# Patient Record
Sex: Female | Born: 1940 | Race: White | Hispanic: No | Smoking: Former smoker
Health system: Southern US, Community
[De-identification: ages and names within clinical notes are randomized; demographics above are authoritative.]

## PROBLEM LIST (undated history)

## (undated) DIAGNOSIS — R0600 Dyspnea, unspecified: Secondary | ICD-10-CM

## (undated) DIAGNOSIS — M810 Age-related osteoporosis without current pathological fracture: Secondary | ICD-10-CM

## (undated) DIAGNOSIS — G250 Essential tremor: Secondary | ICD-10-CM

## (undated) DIAGNOSIS — I10 Essential (primary) hypertension: Secondary | ICD-10-CM

## (undated) DIAGNOSIS — A498 Other bacterial infections of unspecified site: Secondary | ICD-10-CM

## (undated) DIAGNOSIS — K5790 Diverticulosis of intestine, part unspecified, without perforation or abscess without bleeding: Secondary | ICD-10-CM

## (undated) DIAGNOSIS — I82409 Acute embolism and thrombosis of unspecified deep veins of unspecified lower extremity: Secondary | ICD-10-CM

## (undated) DIAGNOSIS — M5136 Other intervertebral disc degeneration, lumbar region: Secondary | ICD-10-CM

## (undated) DIAGNOSIS — M51369 Other intervertebral disc degeneration, lumbar region without mention of lumbar back pain or lower extremity pain: Secondary | ICD-10-CM

## (undated) DIAGNOSIS — M1990 Unspecified osteoarthritis, unspecified site: Secondary | ICD-10-CM

## (undated) DIAGNOSIS — K8020 Calculus of gallbladder without cholecystitis without obstruction: Secondary | ICD-10-CM

## (undated) DIAGNOSIS — G4730 Sleep apnea, unspecified: Secondary | ICD-10-CM

## (undated) DIAGNOSIS — K219 Gastro-esophageal reflux disease without esophagitis: Secondary | ICD-10-CM

## (undated) DIAGNOSIS — Z973 Presence of spectacles and contact lenses: Secondary | ICD-10-CM

## (undated) DIAGNOSIS — G709 Myoneural disorder, unspecified: Secondary | ICD-10-CM

## (undated) HISTORY — DX: Other intervertebral disc degeneration, lumbar region without mention of lumbar back pain or lower extremity pain: M51.369

## (undated) HISTORY — DX: Other intervertebral disc degeneration, lumbar region: M51.36

## (undated) HISTORY — DX: Other bacterial infections of unspecified site: A49.8

## (undated) HISTORY — DX: Essential tremor: G25.0

## (undated) HISTORY — DX: Diverticulosis of intestine, part unspecified, without perforation or abscess without bleeding: K57.90

## (undated) HISTORY — PX: BLADDER REPAIR: SHX76

## (undated) HISTORY — DX: Acute embolism and thrombosis of unspecified deep veins of unspecified lower extremity: I82.409

## (undated) HISTORY — PX: CARPAL TUNNEL RELEASE: SHX101

## (undated) HISTORY — PX: TRIGGER FINGER RELEASE: SHX641

---

## 1981-12-13 HISTORY — PX: ABDOMINAL HYSTERECTOMY: SHX81

## 1999-05-20 ENCOUNTER — Encounter: Payer: Self-pay | Admitting: Orthopedic Surgery

## 1999-05-22 ENCOUNTER — Ambulatory Visit (HOSPITAL_COMMUNITY): Admission: RE | Admit: 1999-05-22 | Discharge: 1999-05-22 | Payer: Self-pay | Admitting: Orthopedic Surgery

## 1999-10-29 ENCOUNTER — Encounter: Admission: RE | Admit: 1999-10-29 | Discharge: 1999-10-29 | Payer: Self-pay | Admitting: Family Medicine

## 1999-10-29 ENCOUNTER — Encounter: Payer: Self-pay | Admitting: Family Medicine

## 2000-11-01 ENCOUNTER — Encounter: Admission: RE | Admit: 2000-11-01 | Discharge: 2000-11-01 | Payer: Self-pay | Admitting: Family Medicine

## 2000-11-01 ENCOUNTER — Encounter: Payer: Self-pay | Admitting: Family Medicine

## 2001-03-31 ENCOUNTER — Encounter: Payer: Self-pay | Admitting: Family Medicine

## 2001-03-31 ENCOUNTER — Encounter: Admission: RE | Admit: 2001-03-31 | Discharge: 2001-03-31 | Payer: Self-pay | Admitting: Family Medicine

## 2001-11-02 ENCOUNTER — Encounter: Admission: RE | Admit: 2001-11-02 | Discharge: 2001-11-02 | Payer: Self-pay | Admitting: Family Medicine

## 2001-11-02 ENCOUNTER — Encounter: Payer: Self-pay | Admitting: Family Medicine

## 2002-11-30 ENCOUNTER — Encounter: Payer: Self-pay | Admitting: Family Medicine

## 2002-11-30 ENCOUNTER — Encounter: Admission: RE | Admit: 2002-11-30 | Discharge: 2002-11-30 | Payer: Self-pay | Admitting: Family Medicine

## 2002-12-13 HISTORY — PX: ROTATOR CUFF REPAIR: SHX139

## 2002-12-13 HISTORY — PX: OTHER SURGICAL HISTORY: SHX169

## 2003-05-09 ENCOUNTER — Encounter: Payer: Self-pay | Admitting: Orthopedic Surgery

## 2003-05-23 ENCOUNTER — Observation Stay (HOSPITAL_COMMUNITY): Admission: RE | Admit: 2003-05-23 | Discharge: 2003-05-24 | Payer: Self-pay | Admitting: Orthopedic Surgery

## 2003-11-28 ENCOUNTER — Observation Stay (HOSPITAL_COMMUNITY): Admission: RE | Admit: 2003-11-28 | Discharge: 2003-11-29 | Payer: Self-pay | Admitting: Orthopedic Surgery

## 2003-12-24 ENCOUNTER — Encounter: Admission: RE | Admit: 2003-12-24 | Discharge: 2003-12-24 | Payer: Self-pay | Admitting: Internal Medicine

## 2004-01-20 ENCOUNTER — Ambulatory Visit (HOSPITAL_COMMUNITY): Admission: RE | Admit: 2004-01-20 | Discharge: 2004-01-20 | Payer: Self-pay | Admitting: *Deleted

## 2004-01-27 ENCOUNTER — Ambulatory Visit (HOSPITAL_COMMUNITY): Admission: RE | Admit: 2004-01-27 | Discharge: 2004-01-27 | Payer: Self-pay | Admitting: *Deleted

## 2004-08-12 ENCOUNTER — Encounter: Admission: RE | Admit: 2004-08-12 | Discharge: 2004-08-12 | Payer: Self-pay | Admitting: Internal Medicine

## 2005-01-07 ENCOUNTER — Encounter: Admission: RE | Admit: 2005-01-07 | Discharge: 2005-01-07 | Payer: Self-pay | Admitting: Internal Medicine

## 2005-01-11 ENCOUNTER — Encounter: Admission: RE | Admit: 2005-01-11 | Discharge: 2005-01-11 | Payer: Self-pay | Admitting: Internal Medicine

## 2005-01-26 ENCOUNTER — Encounter: Admission: RE | Admit: 2005-01-26 | Discharge: 2005-01-26 | Payer: Self-pay | Admitting: Internal Medicine

## 2006-01-24 ENCOUNTER — Encounter: Admission: RE | Admit: 2006-01-24 | Discharge: 2006-01-24 | Payer: Self-pay | Admitting: Internal Medicine

## 2007-03-02 ENCOUNTER — Ambulatory Visit (HOSPITAL_COMMUNITY): Admission: RE | Admit: 2007-03-02 | Discharge: 2007-03-02 | Payer: Self-pay | Admitting: Orthopedic Surgery

## 2007-04-18 ENCOUNTER — Encounter: Admission: RE | Admit: 2007-04-18 | Discharge: 2007-04-18 | Payer: Self-pay | Admitting: Internal Medicine

## 2007-11-05 ENCOUNTER — Emergency Department (HOSPITAL_COMMUNITY): Admission: EM | Admit: 2007-11-05 | Discharge: 2007-11-05 | Payer: Self-pay | Admitting: Emergency Medicine

## 2008-03-09 ENCOUNTER — Encounter: Admission: RE | Admit: 2008-03-09 | Discharge: 2008-03-09 | Payer: Self-pay | Admitting: Internal Medicine

## 2008-05-15 ENCOUNTER — Encounter: Admission: RE | Admit: 2008-05-15 | Discharge: 2008-05-15 | Payer: Self-pay | Admitting: Internal Medicine

## 2008-09-05 ENCOUNTER — Encounter: Admission: RE | Admit: 2008-09-05 | Discharge: 2008-09-05 | Payer: Self-pay | Admitting: Internal Medicine

## 2009-01-21 ENCOUNTER — Emergency Department (HOSPITAL_BASED_OUTPATIENT_CLINIC_OR_DEPARTMENT_OTHER): Admission: EM | Admit: 2009-01-21 | Discharge: 2009-01-21 | Payer: Self-pay | Admitting: Emergency Medicine

## 2009-01-21 ENCOUNTER — Ambulatory Visit: Payer: Self-pay | Admitting: Diagnostic Radiology

## 2009-06-04 ENCOUNTER — Encounter: Admission: RE | Admit: 2009-06-04 | Discharge: 2009-06-04 | Payer: Self-pay | Admitting: Internal Medicine

## 2010-06-24 ENCOUNTER — Encounter: Admission: RE | Admit: 2010-06-24 | Discharge: 2010-06-24 | Payer: Self-pay | Admitting: Internal Medicine

## 2010-10-26 ENCOUNTER — Emergency Department (HOSPITAL_BASED_OUTPATIENT_CLINIC_OR_DEPARTMENT_OTHER): Admission: EM | Admit: 2010-10-26 | Discharge: 2010-10-26 | Payer: Self-pay | Admitting: Emergency Medicine

## 2011-01-03 ENCOUNTER — Encounter: Payer: Self-pay | Admitting: Internal Medicine

## 2011-02-23 LAB — URINALYSIS, ROUTINE W REFLEX MICROSCOPIC
Bilirubin Urine: NEGATIVE
Glucose, UA: NEGATIVE mg/dL
Ketones, ur: NEGATIVE mg/dL
Nitrite: NEGATIVE
pH: 7.5 (ref 5.0–8.0)

## 2011-03-30 LAB — URINE MICROSCOPIC-ADD ON

## 2011-03-30 LAB — URINALYSIS, ROUTINE W REFLEX MICROSCOPIC
Bilirubin Urine: NEGATIVE
Glucose, UA: NEGATIVE mg/dL
Hgb urine dipstick: NEGATIVE
Ketones, ur: NEGATIVE mg/dL
Nitrite: NEGATIVE
Protein, ur: NEGATIVE mg/dL
Specific Gravity, Urine: 1.028 (ref 1.005–1.030)
Urobilinogen, UA: 0.2 mg/dL (ref 0.0–1.0)
pH: 5.5 (ref 5.0–8.0)

## 2011-03-30 LAB — CBC
HCT: 42 % (ref 36.0–46.0)
Hemoglobin: 14.5 g/dL (ref 12.0–15.0)
MCHC: 34.5 g/dL (ref 30.0–36.0)
MCV: 93.9 fL (ref 78.0–100.0)
Platelets: 217 K/uL (ref 150–400)
RBC: 4.47 MIL/uL (ref 3.87–5.11)
RDW: 12.9 % (ref 11.5–15.5)
WBC: 10.6 K/uL — ABNORMAL HIGH (ref 4.0–10.5)

## 2011-03-30 LAB — COMPREHENSIVE METABOLIC PANEL WITH GFR
ALT: 10 U/L (ref 0–35)
AST: 20 U/L (ref 0–37)
Albumin: 4.2 g/dL (ref 3.5–5.2)
Alkaline Phosphatase: 93 U/L (ref 39–117)
Calcium: 9 mg/dL (ref 8.4–10.5)
GFR calc Af Amer: >60 mL/min (ref >60)
Potassium: 3.9 meq/L (ref 3.5–5.1)
Sodium: 137 meq/L (ref 135–145)
Total Protein: 7.5 g/dL (ref 6.0–8.3)

## 2011-03-30 LAB — DIFFERENTIAL
Basophils Absolute: 0.1 10*3/uL (ref 0.0–0.1)
Basophils Relative: 1 % (ref 0–1)
Eosinophils Absolute: 0 K/uL (ref 0.0–0.7)
Eosinophils Relative: 0 % (ref 0–5)
Lymphocytes Relative: 5 % — ABNORMAL LOW (ref 12–46)
Lymphs Abs: 0.5 K/uL — ABNORMAL LOW (ref 0.7–4.0)
Monocytes Absolute: 0.6 K/uL (ref 0.1–1.0)
Monocytes Relative: 5 % (ref 3–12)
Neutro Abs: 9.4 10*3/uL — ABNORMAL HIGH (ref 1.7–7.7)
Neutrophils Relative %: 88 % — ABNORMAL HIGH (ref 43–77)

## 2011-03-30 LAB — COMPREHENSIVE METABOLIC PANEL
BUN: 19 mg/dL (ref 6–23)
CO2: 27 mEq/L (ref 19–32)
Chloride: 100 mEq/L (ref 96–112)
Creatinine, Ser: 0.6 mg/dL (ref 0.4–1.2)
GFR calc non Af Amer: >60 mL/min (ref >60)
Glucose, Bld: 108 mg/dL — ABNORMAL HIGH (ref 70–99)
Total Bilirubin: 0.5 mg/dL (ref 0.3–1.2)

## 2011-03-30 LAB — LIPASE, BLOOD: Lipase: 64 U/L (ref 23–300)

## 2011-04-19 ENCOUNTER — Ambulatory Visit (HOSPITAL_BASED_OUTPATIENT_CLINIC_OR_DEPARTMENT_OTHER)
Admission: RE | Admit: 2011-04-19 | Discharge: 2011-04-19 | Disposition: A | Discharge status: Home / Self Care | Payer: Medicare Other | Source: Ambulatory Visit | Attending: Orthopedic Surgery | Admitting: Orthopedic Surgery

## 2011-04-19 DIAGNOSIS — Z01810 Encounter for preprocedural cardiovascular examination: Secondary | ICD-10-CM | POA: Insufficient documentation

## 2011-04-19 DIAGNOSIS — M6530 Trigger finger, unspecified finger: Secondary | ICD-10-CM | POA: Insufficient documentation

## 2011-04-19 DIAGNOSIS — R9431 Abnormal electrocardiogram [ECG] [EKG]: Secondary | ICD-10-CM | POA: Insufficient documentation

## 2011-04-19 DIAGNOSIS — Z01812 Encounter for preprocedural laboratory examination: Secondary | ICD-10-CM | POA: Insufficient documentation

## 2011-04-20 LAB — POCT I-STAT, CHEM 8
BUN: 11 mg/dL (ref 6–23)
Chloride: 103 mEq/L (ref 96–112)
Creatinine, Ser: 0.8 mg/dL (ref 0.4–1.2)
Sodium: 140 mEq/L (ref 135–145)
TCO2: 24 mmol/L (ref 0–100)

## 2011-04-20 LAB — POCT HEMOGLOBIN-HEMACUE: Hemoglobin: 13.1 g/dL (ref 12.0–15.0)

## 2011-04-28 NOTE — Op Note (Signed)
  [name], [name]              ACCOUNT NO.:  [id number]  MEDICAL RECORD NO.:  [id number]          PATIENT TYPE:  LOCATION:                                 FACILITY:  PHYSICIAN:  Marlowe Kays, M.D.  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  OPERATION:  Release A1 pulley, right long and ring finger.  SURGEON:  Marlowe Kays, MD  ASSISTANT:  Druscilla Brownie. Idolina Primer, PA-C.  ANESTHESIA:  IV regional.  PATHOLOGY AND JUSTIFICATION FOR PROCEDURE:  Staging diagnosis, trigging was painful, and unresponsive to steroid injection.  PROCEDURE IN DETAIL:  After satisfactory regional anesthesia, DuraPrep from midforearm to fingertips was draped in sterile field.  Time-out performed.  I first made a transverse incision midway between the distal palmar crease and the flexor crease at the MP joint, protecting the neurovascular structures with blunt dissection.  I went down to the tendon sheath which I carefully opened with a 15 knife blade and then freed it up proximally and distally with small scissors.  She had 2 areas of extensive sticking down of the overlying tissue to the tendon of one being at the A1 pulley and one proximal to this more in the region of the distal palmar crease.  I then checked passively moving the finger and flexor tendons had normal excursion.  I then performed same surgery for the long finger.  Both wounds were irrigated with sterile saline.  Subcutaneous tissue was closed with 4-0 nylon and the skin preceded by a single 4-0 Vicryl in the subcutaneous tissue.  Betadine and Adaptic dry sterile dressing were applied.  Tourniquet was released. At the time of this  dictation, she was on her way to Recovery in satisfactory condition with no known complications.          ______________________________ Marlowe Kays, M.D.     JA/MEDQ  D:  04/19/2011  T:  04/20/2011  Job:  [id number]  Electronically Signed by Marlowe Kays M.D. on 04/28/2011 04:21:10 PM

## 2011-04-30 NOTE — H&P (Signed)
NAME:  [name], [name]                        ACCOUNT NO.:  [id number]   MEDICAL RECORD NO.:  [id number]                   PATIENT TYPE:  AMB   LOCATION:  DAY                                  FACILITY:  [facility]   PHYSICIAN:  Marlowe Kays, M.D.               DATE OF BIRTH:  May 12, 1941   DATE OF ADMISSION:  05/23/2003  DATE OF DISCHARGE:                                HISTORY & PHYSICAL   CHIEF COMPLAINT:  Pain in both shoulders, worse on the right.   HISTORY OF PRESENT ILLNESS:  The patient is a 70 year old female seen by Dr.  Simonne Come for evaluation of bilateral shoulder pain about six weeks'  duration.  The patient states pain is particularly present at night.  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  She states that the pain goes down to her biceps area.  She has had  carpal tunnel release on the right which she states has some residual  tingling in her right hand.  MRI of the right shoulder reveals large full  thickness tear of the supraspinatus tendon.  The defect is at least 3 cm in  size and a longitudinal intrasubstance split tear of the infraspinatus  tendon.  Dr. Simonne Come felt it was best to do an open rotator cuff repair of  the right shoulder.  The risks and benefits of the surgery are discussed  with the patient and the patient wishes to proceed.   PAST MEDICAL HISTORY:  1. Hypertension.  2. GERD.  3. Anxiety.   PAST SURGICAL HISTORY:  1. Hysterectomy.  2. Bladder tack.  3. Carpal tunnel release on the right.   MEDICATIONS:  1. Diovan HCT 80/12.5 mg one p.o. daily  2. Norvasc 5 mg one p.o. daily.  3. Nexium 40 mg one p.o. daily.  4. Effexor 75 mg one p.o. daily.  5. Premarin 0.9 mg one p.o. daily.  6. Aleve four a day.  7. Calcium supplement.   ALLERGIES:  MORPHINE causes nausea.   SOCIAL HISTORY:  Denies tobacco, minimal alcohol use.  The patient is  married, lives in a one story house.  Her husband  will be caregiver after  surgery.   FAMILY HISTORY:  Mother deceased of stroke, osteoarthritis.  Father,  osteoarthritis.   REVIEW OF SYSTEMS:  GENERAL:  Denies fevers, chills, night sweats, bleeding  tendencies.  CENTRAL NERVOUS SYSTEM:  Denies blurry or double vision,  seizures, headaches, paralysis.  RESPIRATORY:  Denies shortness of breath,  productive cough, or hemoptysis.  CARDIOVASCULAR:  Denies chest pain,  angina, or orthopnea.  GASTROINTESTINAL:  Denies nausea, vomiting, diarrhea,  constipation, melena, bloody stools.  GENITOURINARY:  Denies dysuria,  hematuria, or discharge.  MUSCULOSKELETAL:  Pertinent to HPI.   PHYSICAL EXAMINATION:  VITAL SIGNS:  Blood pressure 145/78, pulse 80,  respirations 16.  GENERAL:  Well-developed, well-nourished, 70 year old  female.  HEENT:  Normocephalic, atraumatic.  Pupils equal, round, and reactive to  light.  NECK:  Supple.  No carotid bruit noted.  CHEST:  Clear to auscultation bilaterally.  No wheezes or crackles.  HEART:  Regular rate and rhythm.  No murmurs, rubs, or gallops.  ABDOMEN:  Soft, nontender, nondistended, positive bowel sounds x4.  EXTREMITIES:  She is tender to palpation in the bicipital groove, also  tender to palpation on the anterior rotator cuff of the right shoulder.  NEUROLOGIC:  She is neurovascularly intact distally.  SKIN:  No rashes or lesions.   LABORATORY DATA:  MRI reveals large full thickness tear of the supraspinatus  tendon, defect of at least 3 cm in size and a longitudinal intrasubstance  split tear of the infraspinatus tendon of the right shoulder.   IMPRESSION:  Torn rotator cuff, right shoulder.   PLAN:  The patient will be admitted to [facility] on May 23, 2003  and undergo an anterior acromionectomy and a repair of the rotator cuff of  the right shoulder by Dr. Marlowe Kays.        Clarene Reamer, P.A.-C.                   Marlowe Kays, M.D.    SW/MEDQ  D:  05/14/2003   T:  05/14/2003  Job:  [id number]

## 2011-04-30 NOTE — Op Note (Signed)
NAME:  [name], [name]                        ACCOUNT NO.:  [id number]   MEDICAL RECORD NO.:  [id number]                   PATIENT TYPE:  AMB   LOCATION:  DAY                                  FACILITY:  [facility]   PHYSICIAN:  Marlowe Kays, M.D.               DATE OF BIRTH:  15-Mar-1941   DATE OF PROCEDURE:  05/23/2003  DATE OF DISCHARGE:                                 OPERATIVE REPORT   PREOPERATIVE DIAGNOSIS:  Torn rotator cuff, right shoulder.   POSTOPERATIVE DIAGNOSIS:  Torn rotator cuff, right shoulder.   OPERATION:  Anterior acromionectomy, repair of torn rotator cuff, right  shoulder.   SURGEON:  Illene Labrador. Aplington, M.D.   ASSISTANTDruscilla Brownie. Underwood, P.A.-C.   ANESTHESIA:  General, preceded by interscalene block.   PATHOLOGY AND JUSTIFICATION FOR PROCEDURE:  She actually has bilateral  rotator cuff tears as depicted on MRI with the right one giving her more  difficulty, and thus is here for surgery on it today.  MRI of Apr 29, 2003,  demonstrated a large, full-thickness tear with retraction, and this was  confirmed at surgery, as discussed below.   DESCRIPTION OF PROCEDURE:  Satisfactory general anesthesia, prophylactic  antibiotics, beach chair position on the Schlein frame.  The right shoulder  girdle was prepped with Betadine and draped in a sterile field.  Ioban  employed.  Vertical incision from roughly the [facility] joint down the greater  tuberosity.  Fascia over the anterior acromion was cut in line with the skin  incision and dissected off with cutting cautery.  She had a large, prominent  anterior acromion.  AC joint was identified with the [facility] needle.  I then  introduced a small Cobb elevator beneath the anterior acromion with joint  fluid coming forth, confirming the tear.  I marked off my initial line of  osteotomy and with a microsaw, performed the initial cut and then beveled  the underneath surface of the anterior acromion further with the saw to  remove enough bone that there was complete relief of her significant  impingement problem.  Rotator cuff tear was then identified.  It was about 2  cm in width, about 1.5 cm of retraction with some layering.  After scraping,  there was also some associated rotator cuff arthropathy of the humeral head  at the greater tuberosity.  I scraped this area with a rongeur and then  placed two super rotator cuff anchors through the greater tuberosity and  returning the two layers to their normal position.  Placed two horizontal  mattress sutures using these two anchors.  Sutures were tied with the arm  slight abduction, and then I made multiple additional sutures around the  lateral perimeter, helping to stabilize and also smooth the repair.  This  gave a nice, stable repair with her arm to her side.  We checked once again  to make sure there was no  residual impingement.  Wound was irrigated with  sterile saline and closed.  The deltoid muscle where we had made about 1 cm  incision was reapproximated in two layers with #1 Vicryl and the fascia over  the anterior acromion with a nice, stable repair, with the same.  Subcutaneous tissue was  closed with a combination of 2-0 Vicryl deep, 4-0 Vicryl superficially, with  Steri-Strips on the skin, dry sterile dressing, shoulder immobilizer were  applied.  At the time of this dictation, she was on her way to the recovery  room in satisfactory condition with no known complications.                                               Marlowe Kays, M.D.    JA/MEDQ  D:  05/23/2003  T:  05/23/2003  Job:  [id number]

## 2011-04-30 NOTE — Op Note (Signed)
NAMESTEFANIA, [name]                        ACCOUNT NO.:  [id number]   MEDICAL RECORD NO.:  [id number]                   PATIENT TYPE:  AMB   LOCATION:  DAY                                  FACILITY:  [facility]   PHYSICIAN:  Marlowe Kays, M.D.               DATE OF BIRTH:  08-19-1941   DATE OF PROCEDURE:  11/28/2003  DATE OF DISCHARGE:                                 OPERATIVE REPORT   PREOPERATIVE DIAGNOSIS:  Torn rotator cuff, left shoulder.   POSTOPERATIVE DIAGNOSIS:  Torn rotator cuff, left shoulder.   OPERATION:  1. Anterior acromionectomy.  2. Repair of torn rotator cuff, left shoulder.   SURGEON:  Marlowe Kays, M.D.   ASSISTANTDruscilla Brownie. Cherlynn June.   ANESTHESIA:  General.   PATHOLOGY AND JUSTIFICATION FOR PROCEDURE:  She has bilateral shoulder  rotator cuff tears with the right previously fixed and doing well and an MRI  on the left on Apr 29, 2003, showing a complete tear measuring about 1.5 cm  in width and 12 mm of retraction.  This is exactly what we found at surgery,  along with abrasion of the rest of the rotator cuff due to a significant  impingement problem.   DESCRIPTION OF PROCEDURE:  Prophylactic antibiotics, satisfactory general  anesthesia, beach chair position on the Schlein frame.  The shoulder girdle  was prepped with Duraprep, draped in a sterile field, Ioban employed,  vertical incision from the left of the [facility] joint down to the greater  tuberosity.  The fascia over the anterior acromion was opened with  subperiosteal dissection using the cutting cautery.  Bleeders were  coagulated.  I identified the [employer] joint with a Keith needle and marked out my  initial line for anterior acromionectomy with the Bovie.  I placed a small  Cobb elevator beneath the rotator cuff with joint fluid coming forth,  confirming the full-thickness tear.  I then used a large Cobb elevator to  protect the underlying rotator cuff and made my initial anterior  acromionectomy, revealing the coracoacromial ligament.  She had a  significant impingement problem with a thick bursa, and I made two  additional osteotomies and also removed bursa until the impingement problem  had been corrected.  I placed bone wax over the raw bone.  The tear was then  identified with the abrasion of the rotator cuff, which was not torn.  I  roughened up the humeral head just above the greater tuberosity with a small  rongeur and then placed a 6.5 bioabsorbable anchor with four suture lines  through the lateral bone after tapping it.  The harpoon was stable on  testing.  I then weaved the four sutures through the leading edge of the  rotator cuff, encompassing all full-thickness structures, and with the arm  abducted sutured the sutures down tightly.  I then trialled this by placing  numerous #1 Vicryl sutures over the terminal  portion of the rotator cuff to  the lateral humeral tissues, including some bursal tissue, which further  cinched the rotator cuff down and smoothed it down nicely.  We then checked.  There was no residual impingement.  The wound was irrigated with sterile  saline and soft tissue was infiltrated with 0.25% Marcaine plain.  A  preoperative stellate ganglion block had seemed to have been ineffective.  We then closed the wound with interrupted #1 Vicryl in the slight split in  the deltoid muscle and fascia over the anterior acromion, and  2-0 Vicryl subcutaneous tissue with Steri-Strips on the skin.  A dry sterile  dressing, shoulder immobilizer applied.  She tolerated the procedure well  and was taken to the recovery room in satisfactory condition with no known  complications.                                               Marlowe Kays, M.D.    JA/MEDQ  D:  11/28/2003  T:  11/28/2003  Job:  [id number]

## 2011-04-30 NOTE — Op Note (Signed)
NAMEKAROLE, OO              ACCOUNT NO.:  [id number]   MEDICAL RECORD NO.:  [id number]          PATIENT TYPE:  AMB   LOCATION:  DAY                          FACILITY:  [facility]   PHYSICIAN:  Marlowe Kays, M.D.  DATE OF BIRTH:  24-Feb-1941   DATE OF PROCEDURE:  03/02/2007  DATE OF DISCHARGE:                               OPERATIVE REPORT   PREOPERATIVE DIAGNOSIS:  Recurrent rotator cuff tear, right shoulder.   POSTOPERATIVE DIAGNOSIS:  Recurrent rotator cuff tear, right shoulder.   OPERATION:  Anterior acromionectomy, repair of recurrent rotator cuff  tear, right shoulder.   SURGEON:  Marlowe Kays, M.D.   ASSISTANT:  Mr. Adrian Blackwater.   ANESTHESIA:  General.   PATHOLOGY AND JUSTIFICATION FOR PROCEDURE:  Roughly 3 years ago I  performed anterior acromionectomy repair of torn rotator cuff.  Perhaps  a year ago she developed some slight pain in the rotator cuff area and  having seen again recently with increased pain, I obtained an MRI on  01/09/2007 which showed recurrent rotator cuff tear.  This was confirmed  at surgery with the pathology discussed below.   PROCEDURE:  Prophylactic antibiotics, satisfactory general anesthesia,  beach-chair position on the Allen frame, right shoulder girdle was  prepped with DuraPrep and draped in sterile field.  I went through the  old surgical incision splitting fascia over the residual anterior  acromion.  I dissected the soft tissue off the residual anterior  acromion anteriorly, performing some additional anterior acromionectomy  with a micro saw and rongeur.  The pathology was noted.  She had an  abrasion injury of the anterior rotator cuff with a slight flap of  superficial rotator cuff tissue and a complete detachment of the rotator  cuff amounting to about 4 cm in width and with about 2 cm of retraction.  She had two rotator cuff anchors in previously and appeared the failure  was the sutures coming out of the rotator cuff,  that is, further  breakdown of the rotator cuff integrity.  Biceps tendon was intact.  There was also a flap of either fibrous tissue, in the gap the body had  attempted to fill in the gap with fibrous tissue, was a thickened flap  posteriorly which could have been rotator cuff tissue.  I debrided out  the fibrous tissue, leaving the posterior flap intact for to assist in  buttressing the repair.  I removed all the previous suture material.  The rotator cuff was flexible.  I abraded the humeral head down to raw  bone at the level just above the greater tuberosity and then used two  two-pronged Mitek anchors spaced along the rotator cuff edge with the  arm slightly abducted, reattached the rotator cuff tightly.  I then  buttressed this lateralward bringing the small posterior flap of tissue  as noted and using the four pieces of still attached suture to the I  previously used for main stabilization incorporating the lateral flap as  well.  I then used individual sutures further buttressing down the  lateral rotator cuff.  This seemed to give a very strong  repair with her  arm to her side without any undue tension on the repair.  I irrigated  the wound well with sterile saline and infiltrated all soft tissues with  0.5% Marcaine with adrenaline.  I reapproximated the small slit in the  deltoid and  the fascia over the anterior acromion with interrupted #1 Vicryl, subcu  tissue with interrupted 0-0 and 2-0 Vicryl.  Steri-Strips on the skin.  Dry sterile dressing, shoulder immobilizer were applied.  At time of  dictation she was on her way to recovery room in satisfactory condition  with no known complications.           ______________________________  Marlowe Kays, M.D.     JA/MEDQ  D:  03/02/2007  T:  03/02/2007  Job:  [id number]

## 2011-07-15 ENCOUNTER — Other Ambulatory Visit: Payer: Self-pay | Admitting: Internal Medicine

## 2011-07-15 DIAGNOSIS — Z1231 Encounter for screening mammogram for malignant neoplasm of breast: Secondary | ICD-10-CM

## 2011-08-02 ENCOUNTER — Ambulatory Visit
Admission: RE | Admit: 2011-08-02 | Discharge: 2011-08-02 | Disposition: A | Discharge status: Home / Self Care | Payer: PRIVATE HEALTH INSURANCE | Source: Ambulatory Visit | Attending: Internal Medicine | Admitting: Internal Medicine

## 2011-08-02 DIAGNOSIS — Z1231 Encounter for screening mammogram for malignant neoplasm of breast: Secondary | ICD-10-CM

## 2011-08-24 ENCOUNTER — Encounter (HOSPITAL_COMMUNITY): Payer: Medicare Other | Attending: Internal Medicine

## 2011-08-24 DIAGNOSIS — M810 Age-related osteoporosis without current pathological fracture: Secondary | ICD-10-CM | POA: Insufficient documentation

## 2011-09-21 LAB — URINALYSIS, ROUTINE W REFLEX MICROSCOPIC
Bilirubin Urine: NEGATIVE
Nitrite: NEGATIVE
Specific Gravity, Urine: 1.009
Urobilinogen, UA: 0.2
pH: 6.5

## 2011-12-22 DIAGNOSIS — R079 Chest pain, unspecified: Secondary | ICD-10-CM | POA: Diagnosis not present

## 2011-12-22 DIAGNOSIS — J0100 Acute maxillary sinusitis, unspecified: Secondary | ICD-10-CM | POA: Diagnosis not present

## 2011-12-30 DIAGNOSIS — M4310 Spondylolisthesis, site unspecified: Secondary | ICD-10-CM | POA: Diagnosis not present

## 2012-02-02 DIAGNOSIS — M4310 Spondylolisthesis, site unspecified: Secondary | ICD-10-CM | POA: Diagnosis not present

## 2012-02-09 DIAGNOSIS — M4310 Spondylolisthesis, site unspecified: Secondary | ICD-10-CM | POA: Diagnosis not present

## 2012-03-08 DIAGNOSIS — M4310 Spondylolisthesis, site unspecified: Secondary | ICD-10-CM | POA: Diagnosis not present

## 2012-03-09 DIAGNOSIS — H5200 Hypermetropia, unspecified eye: Secondary | ICD-10-CM | POA: Insufficient documentation

## 2012-03-09 DIAGNOSIS — H524 Presbyopia: Secondary | ICD-10-CM | POA: Diagnosis not present

## 2012-03-09 DIAGNOSIS — H2589 Other age-related cataract: Secondary | ICD-10-CM | POA: Diagnosis not present

## 2012-03-09 DIAGNOSIS — H02839 Dermatochalasis of unspecified eye, unspecified eyelid: Secondary | ICD-10-CM | POA: Diagnosis not present

## 2012-03-09 DIAGNOSIS — H52209 Unspecified astigmatism, unspecified eye: Secondary | ICD-10-CM | POA: Diagnosis not present

## 2012-03-09 DIAGNOSIS — H11153 Pinguecula, bilateral: Secondary | ICD-10-CM | POA: Insufficient documentation

## 2012-03-09 DIAGNOSIS — H11829 Conjunctivochalasis, unspecified eye: Secondary | ICD-10-CM | POA: Diagnosis not present

## 2012-03-09 DIAGNOSIS — H11159 Pinguecula, unspecified eye: Secondary | ICD-10-CM | POA: Diagnosis not present

## 2012-03-30 DIAGNOSIS — M899 Disorder of bone, unspecified: Secondary | ICD-10-CM | POA: Diagnosis not present

## 2012-03-30 DIAGNOSIS — R7301 Impaired fasting glucose: Secondary | ICD-10-CM | POA: Diagnosis not present

## 2012-03-30 DIAGNOSIS — R82998 Other abnormal findings in urine: Secondary | ICD-10-CM | POA: Diagnosis not present

## 2012-03-30 DIAGNOSIS — M949 Disorder of cartilage, unspecified: Secondary | ICD-10-CM | POA: Diagnosis not present

## 2012-03-30 DIAGNOSIS — I10 Essential (primary) hypertension: Secondary | ICD-10-CM | POA: Diagnosis not present

## 2012-04-04 DIAGNOSIS — M999 Biomechanical lesion, unspecified: Secondary | ICD-10-CM | POA: Diagnosis not present

## 2012-04-04 DIAGNOSIS — M545 Low back pain, unspecified: Secondary | ICD-10-CM | POA: Diagnosis not present

## 2012-04-04 DIAGNOSIS — M5137 Other intervertebral disc degeneration, lumbosacral region: Secondary | ICD-10-CM | POA: Diagnosis not present

## 2012-04-10 DIAGNOSIS — I10 Essential (primary) hypertension: Secondary | ICD-10-CM | POA: Diagnosis not present

## 2012-04-10 DIAGNOSIS — D649 Anemia, unspecified: Secondary | ICD-10-CM | POA: Diagnosis not present

## 2012-04-10 DIAGNOSIS — M949 Disorder of cartilage, unspecified: Secondary | ICD-10-CM | POA: Diagnosis not present

## 2012-04-10 DIAGNOSIS — M899 Disorder of bone, unspecified: Secondary | ICD-10-CM | POA: Diagnosis not present

## 2012-04-10 DIAGNOSIS — R7301 Impaired fasting glucose: Secondary | ICD-10-CM | POA: Diagnosis not present

## 2012-04-10 DIAGNOSIS — Z0000 Encounter for general adult medical examination without abnormal findings: Secondary | ICD-10-CM | POA: Diagnosis not present

## 2012-04-10 DIAGNOSIS — L659 Nonscarring hair loss, unspecified: Secondary | ICD-10-CM | POA: Diagnosis not present

## 2012-04-11 DIAGNOSIS — M5137 Other intervertebral disc degeneration, lumbosacral region: Secondary | ICD-10-CM | POA: Diagnosis not present

## 2012-04-11 DIAGNOSIS — M545 Low back pain, unspecified: Secondary | ICD-10-CM | POA: Diagnosis not present

## 2012-04-11 DIAGNOSIS — M999 Biomechanical lesion, unspecified: Secondary | ICD-10-CM | POA: Diagnosis not present

## 2012-04-18 DIAGNOSIS — M545 Low back pain, unspecified: Secondary | ICD-10-CM | POA: Diagnosis not present

## 2012-04-18 DIAGNOSIS — M999 Biomechanical lesion, unspecified: Secondary | ICD-10-CM | POA: Diagnosis not present

## 2012-04-18 DIAGNOSIS — M5137 Other intervertebral disc degeneration, lumbosacral region: Secondary | ICD-10-CM | POA: Diagnosis not present

## 2012-04-27 DIAGNOSIS — M999 Biomechanical lesion, unspecified: Secondary | ICD-10-CM | POA: Diagnosis not present

## 2012-04-27 DIAGNOSIS — M545 Low back pain, unspecified: Secondary | ICD-10-CM | POA: Diagnosis not present

## 2012-04-27 DIAGNOSIS — M5137 Other intervertebral disc degeneration, lumbosacral region: Secondary | ICD-10-CM | POA: Diagnosis not present

## 2012-05-10 DIAGNOSIS — M5137 Other intervertebral disc degeneration, lumbosacral region: Secondary | ICD-10-CM | POA: Diagnosis not present

## 2012-05-10 DIAGNOSIS — M999 Biomechanical lesion, unspecified: Secondary | ICD-10-CM | POA: Diagnosis not present

## 2012-05-10 DIAGNOSIS — M545 Low back pain, unspecified: Secondary | ICD-10-CM | POA: Diagnosis not present

## 2012-05-24 DIAGNOSIS — M999 Biomechanical lesion, unspecified: Secondary | ICD-10-CM | POA: Diagnosis not present

## 2012-05-24 DIAGNOSIS — M545 Low back pain, unspecified: Secondary | ICD-10-CM | POA: Diagnosis not present

## 2012-05-24 DIAGNOSIS — M5137 Other intervertebral disc degeneration, lumbosacral region: Secondary | ICD-10-CM | POA: Diagnosis not present

## 2012-06-07 DIAGNOSIS — M999 Biomechanical lesion, unspecified: Secondary | ICD-10-CM | POA: Diagnosis not present

## 2012-06-07 DIAGNOSIS — M545 Low back pain, unspecified: Secondary | ICD-10-CM | POA: Diagnosis not present

## 2012-06-07 DIAGNOSIS — M5137 Other intervertebral disc degeneration, lumbosacral region: Secondary | ICD-10-CM | POA: Diagnosis not present

## 2012-06-21 DIAGNOSIS — M545 Low back pain, unspecified: Secondary | ICD-10-CM | POA: Diagnosis not present

## 2012-06-21 DIAGNOSIS — M5137 Other intervertebral disc degeneration, lumbosacral region: Secondary | ICD-10-CM | POA: Diagnosis not present

## 2012-06-21 DIAGNOSIS — M999 Biomechanical lesion, unspecified: Secondary | ICD-10-CM | POA: Diagnosis not present

## 2012-07-04 DIAGNOSIS — M25519 Pain in unspecified shoulder: Secondary | ICD-10-CM | POA: Diagnosis not present

## 2012-07-04 DIAGNOSIS — M4310 Spondylolisthesis, site unspecified: Secondary | ICD-10-CM | POA: Diagnosis not present

## 2012-07-05 ENCOUNTER — Other Ambulatory Visit: Payer: Self-pay | Admitting: Orthopedic Surgery

## 2012-07-05 DIAGNOSIS — M4800 Spinal stenosis, site unspecified: Secondary | ICD-10-CM

## 2012-07-05 DIAGNOSIS — M5126 Other intervertebral disc displacement, lumbar region: Secondary | ICD-10-CM

## 2012-07-10 ENCOUNTER — Other Ambulatory Visit: Payer: Self-pay | Admitting: Internal Medicine

## 2012-07-10 DIAGNOSIS — Z1231 Encounter for screening mammogram for malignant neoplasm of breast: Secondary | ICD-10-CM

## 2012-07-11 ENCOUNTER — Ambulatory Visit
Admission: RE | Admit: 2012-07-11 | Discharge: 2012-07-11 | Disposition: A | Discharge status: Home / Self Care | Payer: Medicare Other | Source: Ambulatory Visit | Attending: Orthopedic Surgery | Admitting: Orthopedic Surgery

## 2012-07-11 VITALS — BP 161/77 | HR 75 | Ht 61.0 in | Wt 185.0 lb

## 2012-07-11 DIAGNOSIS — M47817 Spondylosis without myelopathy or radiculopathy, lumbosacral region: Secondary | ICD-10-CM | POA: Diagnosis not present

## 2012-07-11 DIAGNOSIS — M4310 Spondylolisthesis, site unspecified: Secondary | ICD-10-CM | POA: Diagnosis not present

## 2012-07-11 DIAGNOSIS — M4800 Spinal stenosis, site unspecified: Secondary | ICD-10-CM

## 2012-07-11 DIAGNOSIS — M5126 Other intervertebral disc displacement, lumbar region: Secondary | ICD-10-CM

## 2012-07-11 DIAGNOSIS — M5137 Other intervertebral disc degeneration, lumbosacral region: Secondary | ICD-10-CM | POA: Diagnosis not present

## 2012-07-11 DIAGNOSIS — M549 Dorsalgia, unspecified: Secondary | ICD-10-CM | POA: Diagnosis not present

## 2012-07-11 DIAGNOSIS — M48061 Spinal stenosis, lumbar region without neurogenic claudication: Secondary | ICD-10-CM | POA: Diagnosis not present

## 2012-07-11 MED ORDER — ONDANSETRON HCL 4 MG/2ML IJ SOLN: 4.0000 mg | Freq: Four times a day (QID) | INTRAMUSCULAR | Status: DC | PRN

## 2012-07-11 MED ORDER — DIAZEPAM 5 MG PO TABS
5.0000 mg | ORAL_TABLET | Freq: Once | ORAL | Status: AC
  Administered 2012-07-11: 5 mg via ORAL

## 2012-07-11 MED ORDER — IOHEXOL 180 MG/ML  SOLN
20.0000 mL | Freq: Once | INTRAMUSCULAR | Status: AC | PRN
  Administered 2012-07-11: 20 mL via INTRATHECAL

## 2012-07-11 NOTE — Progress Notes (Signed)
Pt states she has been off effexor for the past 2 days.   1044 called husband with discharge time, he is in the lobby.

## 2012-07-14 ENCOUNTER — Ambulatory Visit
Admission: RE | Admit: 2012-07-14 | Discharge: 2012-07-14 | Disposition: A | Discharge status: Home / Self Care | Payer: Medicare Other | Source: Ambulatory Visit | Attending: Orthopedic Surgery | Admitting: Orthopedic Surgery

## 2012-07-14 DIAGNOSIS — M25519 Pain in unspecified shoulder: Secondary | ICD-10-CM | POA: Diagnosis not present

## 2012-07-14 DIAGNOSIS — R5381 Other malaise: Secondary | ICD-10-CM | POA: Diagnosis not present

## 2012-07-14 DIAGNOSIS — M5126 Other intervertebral disc displacement, lumbar region: Secondary | ICD-10-CM

## 2012-07-14 DIAGNOSIS — M4800 Spinal stenosis, site unspecified: Secondary | ICD-10-CM

## 2012-07-14 DIAGNOSIS — R5383 Other fatigue: Secondary | ICD-10-CM | POA: Diagnosis not present

## 2012-07-14 DIAGNOSIS — S43429A Sprain of unspecified rotator cuff capsule, initial encounter: Secondary | ICD-10-CM | POA: Diagnosis not present

## 2012-07-14 MED ORDER — IOHEXOL 300 MG/ML  SOLN
10.0000 mL | Freq: Once | INTRAMUSCULAR | Status: AC | PRN
  Administered 2012-07-14: 10 mL via INTRA_ARTICULAR

## 2012-07-21 DIAGNOSIS — M4310 Spondylolisthesis, site unspecified: Secondary | ICD-10-CM | POA: Diagnosis not present

## 2012-08-24 ENCOUNTER — Ambulatory Visit: Payer: Medicare Other

## 2012-08-24 DIAGNOSIS — M899 Disorder of bone, unspecified: Secondary | ICD-10-CM | POA: Diagnosis not present

## 2012-08-24 DIAGNOSIS — Z79899 Other long term (current) drug therapy: Secondary | ICD-10-CM | POA: Diagnosis not present

## 2012-09-07 ENCOUNTER — Ambulatory Visit
Admission: RE | Admit: 2012-09-07 | Discharge: 2012-09-07 | Disposition: A | Discharge status: Home / Self Care | Payer: Medicare Other | Source: Ambulatory Visit | Attending: Internal Medicine | Admitting: Internal Medicine

## 2012-09-07 DIAGNOSIS — J309 Allergic rhinitis, unspecified: Secondary | ICD-10-CM | POA: Diagnosis not present

## 2012-09-07 DIAGNOSIS — H04209 Unspecified epiphora, unspecified lacrimal gland: Secondary | ICD-10-CM | POA: Diagnosis not present

## 2012-09-07 DIAGNOSIS — Z1231 Encounter for screening mammogram for malignant neoplasm of breast: Secondary | ICD-10-CM

## 2012-09-07 DIAGNOSIS — K219 Gastro-esophageal reflux disease without esophagitis: Secondary | ICD-10-CM | POA: Diagnosis not present

## 2012-09-08 ENCOUNTER — Other Ambulatory Visit (HOSPITAL_COMMUNITY): Payer: Self-pay | Admitting: *Deleted

## 2012-09-08 ENCOUNTER — Encounter (HOSPITAL_COMMUNITY): Payer: Self-pay

## 2012-09-08 ENCOUNTER — Encounter (HOSPITAL_COMMUNITY)
Admission: RE | Admit: 2012-09-08 | Discharge: 2012-09-08 | Disposition: A | Discharge status: Home / Self Care | Payer: Medicare Other | Source: Ambulatory Visit | Attending: Internal Medicine | Admitting: Internal Medicine

## 2012-09-08 DIAGNOSIS — M810 Age-related osteoporosis without current pathological fracture: Secondary | ICD-10-CM | POA: Diagnosis not present

## 2012-09-08 HISTORY — DX: Essential (primary) hypertension: I10

## 2012-09-08 HISTORY — DX: Age-related osteoporosis without current pathological fracture: M81.0

## 2012-09-08 MED ORDER — SODIUM CHLORIDE 0.9 % IV SOLN
Freq: Once | INTRAVENOUS | Status: AC
  Administered 2012-09-08: 12:00:00 via INTRAVENOUS

## 2012-09-08 MED ORDER — ZOLEDRONIC ACID 5 MG/100ML IV SOLN
5.0000 mg | Freq: Once | INTRAVENOUS | Status: AC
  Administered 2012-09-08: 5 mg via INTRAVENOUS
  Filled 2012-09-08: qty 100

## 2012-09-23 DIAGNOSIS — N390 Urinary tract infection, site not specified: Secondary | ICD-10-CM | POA: Diagnosis not present

## 2012-09-26 DIAGNOSIS — M4310 Spondylolisthesis, site unspecified: Secondary | ICD-10-CM | POA: Diagnosis not present

## 2012-09-26 DIAGNOSIS — M47817 Spondylosis without myelopathy or radiculopathy, lumbosacral region: Secondary | ICD-10-CM | POA: Diagnosis not present

## 2012-10-04 ENCOUNTER — Other Ambulatory Visit: Payer: Self-pay | Admitting: Orthopedic Surgery

## 2012-10-10 DIAGNOSIS — Z23 Encounter for immunization: Secondary | ICD-10-CM | POA: Diagnosis not present

## 2012-10-17 DIAGNOSIS — N390 Urinary tract infection, site not specified: Secondary | ICD-10-CM | POA: Diagnosis not present

## 2012-10-24 ENCOUNTER — Other Ambulatory Visit: Payer: Self-pay | Admitting: Orthopedic Surgery

## 2012-10-31 ENCOUNTER — Ambulatory Visit: Admit: 2012-10-31 | Payer: Self-pay | Admitting: Orthopedic Surgery

## 2012-10-31 SURGERY — LUMBAR LAMINECTOMY/DECOMPRESSION MICRODISCECTOMY 1 LEVEL
Anesthesia: General | Site: Back

## 2012-11-21 DIAGNOSIS — K5289 Other specified noninfective gastroenteritis and colitis: Secondary | ICD-10-CM | POA: Diagnosis not present

## 2012-11-21 DIAGNOSIS — I10 Essential (primary) hypertension: Secondary | ICD-10-CM | POA: Diagnosis not present

## 2012-11-22 ENCOUNTER — Encounter (HOSPITAL_COMMUNITY): Payer: Self-pay | Admitting: Pharmacy Technician

## 2012-11-24 ENCOUNTER — Ambulatory Visit (HOSPITAL_COMMUNITY)
Admission: RE | Admit: 2012-11-24 | Discharge: 2012-11-24 | Disposition: A | Discharge status: Home / Self Care | Payer: Medicare Other | Source: Ambulatory Visit | Attending: Orthopedic Surgery | Admitting: Orthopedic Surgery

## 2012-11-24 ENCOUNTER — Encounter (HOSPITAL_COMMUNITY): Payer: Self-pay

## 2012-11-24 ENCOUNTER — Encounter (HOSPITAL_COMMUNITY)
Admission: RE | Admit: 2012-11-24 | Discharge: 2012-11-24 | Disposition: A | Discharge status: Home / Self Care | Payer: Medicare Other | Source: Ambulatory Visit | Attending: Orthopedic Surgery | Admitting: Orthopedic Surgery

## 2012-11-24 DIAGNOSIS — Z01818 Encounter for other preprocedural examination: Secondary | ICD-10-CM | POA: Insufficient documentation

## 2012-11-24 DIAGNOSIS — I10 Essential (primary) hypertension: Secondary | ICD-10-CM | POA: Insufficient documentation

## 2012-11-24 DIAGNOSIS — Z01812 Encounter for preprocedural laboratory examination: Secondary | ICD-10-CM | POA: Diagnosis not present

## 2012-11-24 DIAGNOSIS — R9431 Abnormal electrocardiogram [ECG] [EKG]: Secondary | ICD-10-CM | POA: Diagnosis not present

## 2012-11-24 DIAGNOSIS — Z01810 Encounter for preprocedural cardiovascular examination: Secondary | ICD-10-CM | POA: Insufficient documentation

## 2012-11-24 HISTORY — DX: Unspecified osteoarthritis, unspecified site: M19.90

## 2012-11-24 HISTORY — DX: Gastro-esophageal reflux disease without esophagitis: K21.9

## 2012-11-24 LAB — CBC
MCH: 31.2 pg (ref 26.0–34.0)
MCV: 91.4 fL (ref 78.0–100.0)
Platelets: 219 10*3/uL (ref 150–400)
RBC: 4.29 MIL/uL (ref 3.87–5.11)
RDW: 13.2 % (ref 11.5–15.5)
WBC: 7.3 10*3/uL (ref 4.0–10.5)

## 2012-11-24 LAB — BASIC METABOLIC PANEL
CO2: 26 mEq/L (ref 19–32)
Calcium: 8.6 mg/dL (ref 8.4–10.5)
Creatinine, Ser: 0.54 mg/dL (ref 0.50–1.10)
GFR calc non Af Amer: >90 mL/min (ref >90)
Sodium: 138 mEq/L (ref 135–145)

## 2012-11-24 LAB — SURGICAL PCR SCREEN: MRSA, PCR: NEGATIVE

## 2012-11-24 NOTE — Pre-Procedure Instructions (Addendum)
11-24-12 EKG/CXR done today. 11-24-12 Dr. Acey Lav -made aware "Pt. Was told has small throat"-only x1-no problems with intubations on some surgeries.  Will see AM of 12-01-11 prior to surgery. W. Kennon Portela

## 2012-11-24 NOTE — Patient Instructions (Addendum)
20 [name]  11/24/2012   Your procedure is scheduled on:  12-19 -2013  Report to [facility] at     1100   AM.  Call this number if you have problems the morning of surgery: [phone]  Or Presurgical Testing [phone](Brax Walen)      Do not eat food:After Midnight.  May have clear liquids:up to 6 Hours before arrival. Nothing after : 0700 AM  Clear liquids include soda, tea, black coffee, apple or grape juice, broth.  Take these medicines the morning of surgery with A SIP OF WATER: Amlodipine. Tramadol. Venlafaxine   Do not wear jewelry, make-up or nail polish.  Do not wear lotions, powders, or perfumes. You may wear deodorant.  Do not shave 48 hours prior to surgery.(face and neck okay, no shaving of legs)  Do not bring valuables to the hospital.  Contacts, dentures or bridgework may not be worn into surgery.  Leave suitcase in the car. After surgery it may be brought to your room.  For patients admitted to the hospital, checkout time is 11:00 AM the day of discharge.   Patients discharged the day of surgery will not be allowed to drive home. Must have responsible person with you x 24 hours once discharged.  Name and phone number of your driver: [name] .spouse [phone] cell  Special Instructions: CHG Shower Use Special Wash: see special instructions.(avoid face and genitals)   Please read over the following fact sheets that you were given: MRSA Information, Blood Transfusion fact sheet, Incentive Spirometry Instruction.    Failure to follow these instructions may result in Cancellation of your surgery.   Patient signature_______________________________________________________

## 2012-11-30 ENCOUNTER — Encounter (HOSPITAL_COMMUNITY): Payer: Self-pay | Admitting: *Deleted

## 2012-11-30 ENCOUNTER — Encounter (HOSPITAL_COMMUNITY)
Admission: RE | Disposition: A | Discharge status: Home / Self Care | Payer: Self-pay | Source: Ambulatory Visit | Attending: Orthopedic Surgery

## 2012-11-30 ENCOUNTER — Ambulatory Visit (HOSPITAL_COMMUNITY): Payer: Medicare Other

## 2012-11-30 ENCOUNTER — Encounter (HOSPITAL_COMMUNITY): Payer: Self-pay | Admitting: Anesthesiology

## 2012-11-30 ENCOUNTER — Ambulatory Visit (HOSPITAL_COMMUNITY): Payer: Medicare Other | Admitting: Anesthesiology

## 2012-11-30 ENCOUNTER — Observation Stay (HOSPITAL_COMMUNITY)
Admission: RE | Admit: 2012-11-30 | Discharge: 2012-12-02 | Disposition: A | Discharge status: Home / Self Care | Payer: Medicare Other | Source: Ambulatory Visit | Attending: Orthopedic Surgery | Admitting: Orthopedic Surgery

## 2012-11-30 DIAGNOSIS — Z7982 Long term (current) use of aspirin: Secondary | ICD-10-CM | POA: Diagnosis not present

## 2012-11-30 DIAGNOSIS — I10 Essential (primary) hypertension: Secondary | ICD-10-CM | POA: Insufficient documentation

## 2012-11-30 DIAGNOSIS — M545 Low back pain, unspecified: Secondary | ICD-10-CM

## 2012-11-30 DIAGNOSIS — M539 Dorsopathy, unspecified: Secondary | ICD-10-CM | POA: Diagnosis not present

## 2012-11-30 DIAGNOSIS — M48061 Spinal stenosis, lumbar region without neurogenic claudication: Secondary | ICD-10-CM | POA: Diagnosis not present

## 2012-11-30 DIAGNOSIS — M5126 Other intervertebral disc displacement, lumbar region: Secondary | ICD-10-CM | POA: Diagnosis not present

## 2012-11-30 DIAGNOSIS — Z79899 Other long term (current) drug therapy: Secondary | ICD-10-CM | POA: Insufficient documentation

## 2012-11-30 DIAGNOSIS — K219 Gastro-esophageal reflux disease without esophagitis: Secondary | ICD-10-CM | POA: Insufficient documentation

## 2012-11-30 DIAGNOSIS — M810 Age-related osteoporosis without current pathological fracture: Secondary | ICD-10-CM | POA: Insufficient documentation

## 2012-11-30 DIAGNOSIS — M5137 Other intervertebral disc degeneration, lumbosacral region: Secondary | ICD-10-CM | POA: Diagnosis not present

## 2012-11-30 HISTORY — PX: LUMBAR LAMINECTOMY/DECOMPRESSION MICRODISCECTOMY: SHX5026

## 2012-11-30 SURGERY — LUMBAR LAMINECTOMY/DECOMPRESSION MICRODISCECTOMY 2 LEVELS
Anesthesia: General | Site: Back | Wound class: Clean

## 2012-11-30 MED ORDER — METHOCARBAMOL 500 MG PO TABS: 500.0000 mg | ORAL_TABLET | Freq: Four times a day (QID) | ORAL | Status: DC | PRN

## 2012-11-30 MED ORDER — CEFAZOLIN SODIUM-DEXTROSE 2-3 GM-% IV SOLR
2.0000 g | INTRAVENOUS | Status: AC
  Administered 2012-11-30: 2 g via INTRAVENOUS

## 2012-11-30 MED ORDER — THROMBIN 5000 UNITS EX SOLR
CUTANEOUS | Status: AC
  Filled 2012-11-30: qty 10000

## 2012-11-30 MED ORDER — LACTATED RINGERS IV SOLN
INTRAVENOUS | Status: DC
  Administered 2012-11-30: 1000 mL via INTRAVENOUS
  Administered 2012-11-30 (×2): via INTRAVENOUS

## 2012-11-30 MED ORDER — PANTOPRAZOLE SODIUM 40 MG PO TBEC
80.0000 mg | DELAYED_RELEASE_TABLET | Freq: Every day | ORAL | Status: DC
  Administered 2012-11-30 – 2012-12-02 (×3): 80 mg via ORAL
  Filled 2012-11-30 (×3): qty 2

## 2012-11-30 MED ORDER — FENTANYL CITRATE 0.05 MG/ML IJ SOLN
INTRAMUSCULAR | Status: DC | PRN
  Administered 2012-11-30: 50 ug via INTRAVENOUS
  Administered 2012-11-30: 100 ug via INTRAVENOUS

## 2012-11-30 MED ORDER — PHENYLEPHRINE HCL 10 MG/ML IJ SOLN
INTRAMUSCULAR | Status: DC | PRN
  Administered 2012-11-30 (×2): 80 ug via INTRAVENOUS
  Administered 2012-11-30: 40 ug via INTRAVENOUS
  Administered 2012-11-30: 80 ug via INTRAVENOUS

## 2012-11-30 MED ORDER — HYDROMORPHONE HCL PF 1 MG/ML IJ SOLN
INTRAMUSCULAR | Status: AC
  Filled 2012-11-30: qty 1

## 2012-11-30 MED ORDER — ONDANSETRON HCL 4 MG/2ML IJ SOLN: 4.0000 mg | INTRAMUSCULAR | Status: DC | PRN

## 2012-11-30 MED ORDER — POVIDONE-IODINE 10 % EX SOLN
CUTANEOUS | Status: DC | PRN
  Administered 2012-11-30: 1 via TOPICAL

## 2012-11-30 MED ORDER — EPHEDRINE SULFATE 50 MG/ML IJ SOLN
INTRAMUSCULAR | Status: DC | PRN
  Administered 2012-11-30: 10 mg via INTRAVENOUS
  Administered 2012-11-30 (×3): 5 mg via INTRAVENOUS

## 2012-11-30 MED ORDER — VENLAFAXINE HCL ER 150 MG PO CP24
150.0000 mg | ORAL_CAPSULE | Freq: Every morning | ORAL | Status: DC
  Administered 2012-12-01 – 2012-12-02 (×2): 150 mg via ORAL
  Filled 2012-11-30 (×2): qty 1

## 2012-11-30 MED ORDER — SUCCINYLCHOLINE CHLORIDE 20 MG/ML IJ SOLN
INTRAMUSCULAR | Status: DC | PRN
  Administered 2012-11-30: 100 mg via INTRAVENOUS

## 2012-11-30 MED ORDER — HEMOSTATIC AGENTS (NO CHARGE) OPTIME
TOPICAL | Status: DC | PRN
  Administered 2012-11-30: 1 via TOPICAL

## 2012-11-30 MED ORDER — ACETAMINOPHEN 10 MG/ML IV SOLN: 1000.0000 mg | Freq: Once | INTRAVENOUS | Status: DC | PRN

## 2012-11-30 MED ORDER — NALOXONE HCL 0.4 MG/ML IJ SOLN
INTRAMUSCULAR | Status: DC | PRN
  Administered 2012-11-30: .04 mg via INTRAVENOUS

## 2012-11-30 MED ORDER — PRIMIDONE 50 MG PO TABS
50.0000 mg | ORAL_TABLET | Freq: Every day | ORAL | Status: DC
  Administered 2012-11-30 – 2012-12-01 (×2): 50 mg via ORAL
  Filled 2012-11-30 (×3): qty 1

## 2012-11-30 MED ORDER — AMLODIPINE BESYLATE 5 MG PO TABS
5.0000 mg | ORAL_TABLET | Freq: Every morning | ORAL | Status: DC
  Administered 2012-12-01 – 2012-12-02 (×2): 5 mg via ORAL
  Filled 2012-11-30 (×2): qty 1

## 2012-11-30 MED ORDER — ONDANSETRON HCL 4 MG/2ML IJ SOLN
INTRAMUSCULAR | Status: DC | PRN
  Administered 2012-11-30: 4 mg via INTRAVENOUS

## 2012-11-30 MED ORDER — ACETAMINOPHEN 10 MG/ML IV SOLN
INTRAVENOUS | Status: AC
  Filled 2012-11-30: qty 100

## 2012-11-30 MED ORDER — THROMBIN 5000 UNITS EX SOLR
CUTANEOUS | Status: DC | PRN
  Administered 2012-11-30: 10000 [IU] via TOPICAL

## 2012-11-30 MED ORDER — KETAMINE HCL 50 MG/ML IJ SOLN
INTRAMUSCULAR | Status: DC | PRN
  Administered 2012-11-30: 25 mg via INTRAMUSCULAR
  Administered 2012-11-30 (×3): 1 mg via INTRAMUSCULAR
  Administered 2012-11-30: 2 mg via INTRAMUSCULAR
  Administered 2012-11-30 (×3): 1 mg via INTRAMUSCULAR

## 2012-11-30 MED ORDER — PROPOFOL 10 MG/ML IV BOLUS
INTRAVENOUS | Status: DC | PRN
  Administered 2012-11-30: 150 mg via INTRAVENOUS

## 2012-11-30 MED ORDER — BUPIVACAINE-EPINEPHRINE (PF) 0.5% -1:200000 IJ SOLN
INTRAMUSCULAR | Status: AC
  Filled 2012-11-30: qty 10

## 2012-11-30 MED ORDER — PHENYLEPHRINE HCL 10 MG/ML IJ SOLN
10.0000 mg | INTRAVENOUS | Status: DC | PRN
  Administered 2012-11-30: 50 ug/min via INTRAVENOUS

## 2012-11-30 MED ORDER — FENTANYL CITRATE 0.05 MG/ML IJ SOLN
INTRAMUSCULAR | Status: AC
  Filled 2012-11-30: qty 2

## 2012-11-30 MED ORDER — DEXTROSE IN LACTATED RINGERS 5 % IV SOLN
INTRAVENOUS | Status: DC
  Administered 2012-11-30: 21:00:00 via INTRAVENOUS

## 2012-11-30 MED ORDER — ACETAMINOPHEN 10 MG/ML IV SOLN
INTRAVENOUS | Status: DC | PRN
  Administered 2012-11-30: 1000 mg via INTRAVENOUS

## 2012-11-30 MED ORDER — POVIDONE-IODINE 7.5 % EX SOLN: Freq: Once | CUTANEOUS | Status: DC

## 2012-11-30 MED ORDER — CEFAZOLIN SODIUM-DEXTROSE 2-3 GM-% IV SOLR
INTRAVENOUS | Status: AC
  Filled 2012-11-30: qty 50

## 2012-11-30 MED ORDER — HYDROMORPHONE HCL PF 1 MG/ML IJ SOLN
INTRAMUSCULAR | Status: DC | PRN
  Administered 2012-11-30: 0.5 mg via INTRAVENOUS
  Administered 2012-11-30: 1 mg via INTRAVENOUS
  Administered 2012-11-30: 0.5 mg via INTRAVENOUS

## 2012-11-30 MED ORDER — PHENOL 1.4 % MT LIQD
1.0000 | OROMUCOSAL | Status: DC | PRN
  Filled 2012-11-30: qty 177

## 2012-11-30 MED ORDER — SODIUM CHLORIDE 0.9 % IV SOLN: 250.0000 mL | INTRAVENOUS | Status: DC

## 2012-11-30 MED ORDER — MENTHOL 3 MG MT LOZG
1.0000 | LOZENGE | OROMUCOSAL | Status: DC | PRN
  Filled 2012-11-30: qty 9

## 2012-11-30 MED ORDER — ALBUTEROL SULFATE HFA 108 (90 BASE) MCG/ACT IN AERS
INHALATION_SPRAY | RESPIRATORY_TRACT | Status: DC | PRN
  Administered 2012-11-30: 5 via RESPIRATORY_TRACT

## 2012-11-30 MED ORDER — MEPERIDINE HCL 50 MG/ML IJ SOLN: 6.2500 mg | INTRAMUSCULAR | Status: DC | PRN

## 2012-11-30 MED ORDER — PROMETHAZINE HCL 25 MG/ML IJ SOLN: 6.2500 mg | INTRAMUSCULAR | Status: DC | PRN

## 2012-11-30 MED ORDER — FENTANYL CITRATE 0.05 MG/ML IJ SOLN
25.0000 ug | INTRAMUSCULAR | Status: DC | PRN
  Administered 2012-11-30 (×2): 50 ug via INTRAVENOUS

## 2012-11-30 MED ORDER — SODIUM CHLORIDE 0.9 % IJ SOLN
3.0000 mL | Freq: Two times a day (BID) | INTRAMUSCULAR | Status: DC
  Administered 2012-12-01: 3 mL via INTRAVENOUS

## 2012-11-30 MED ORDER — HYDROMORPHONE HCL PF 1 MG/ML IJ SOLN
0.2500 mg | INTRAMUSCULAR | Status: DC | PRN
  Administered 2012-11-30: 0.5 mg via INTRAVENOUS

## 2012-11-30 MED ORDER — SODIUM CHLORIDE 0.9 % IJ SOLN: 3.0000 mL | INTRAMUSCULAR | Status: DC | PRN

## 2012-11-30 MED ORDER — METHOCARBAMOL 100 MG/ML IJ SOLN
500.0000 mg | Freq: Four times a day (QID) | INTRAVENOUS | Status: DC | PRN
  Administered 2012-11-30: 500 mg via INTRAVENOUS
  Filled 2012-11-30: qty 5

## 2012-11-30 MED ORDER — CEFAZOLIN SODIUM-DEXTROSE 2-3 GM-% IV SOLR
2.0000 g | Freq: Three times a day (TID) | INTRAVENOUS | Status: AC
  Administered 2012-11-30 – 2012-12-01 (×2): 2 g via INTRAVENOUS
  Filled 2012-11-30 (×2): qty 50

## 2012-11-30 SURGICAL SUPPLY — 53 items
APL SKNCLS STERI-STRIP NONHPOA (GAUZE/BANDAGES/DRESSINGS) ×1
BAG SPEC THK2 15X12 ZIP CLS (MISCELLANEOUS) ×1
BAG ZIPLOCK 12X15 (MISCELLANEOUS) ×2 IMPLANT
BENZOIN TINCTURE PRP APPL 2/3 (GAUZE/BANDAGES/DRESSINGS) ×2 IMPLANT
BUR EGG ELITE 4.0 (BURR) ×2 IMPLANT
CLEANER TIP ELECTROSURG 2X2 (MISCELLANEOUS) ×2 IMPLANT
CLOTH BEACON ORANGE TIMEOUT ST (SAFETY) ×2 IMPLANT
CONT SPEC 4OZ CLIKSEAL STRL BL (MISCELLANEOUS) ×2 IMPLANT
CORDS BIPOLAR (ELECTRODE) ×1 IMPLANT
DRAIN PENROSE 18X1/4 LTX STRL (WOUND CARE) IMPLANT
DRAPE MICROSCOPE LEICA (MISCELLANEOUS) ×2 IMPLANT
DRAPE POUCH INSTRU U-SHP 10X18 (DRAPES) ×2 IMPLANT
DRAPE SURG 17X11 SM STRL (DRAPES) ×2 IMPLANT
DRSG ADAPTIC 3X8 NADH LF (GAUZE/BANDAGES/DRESSINGS) ×2 IMPLANT
DRSG PAD ABDOMINAL 8X10 ST (GAUZE/BANDAGES/DRESSINGS) ×2 IMPLANT
DURAPREP 26ML APPLICATOR (WOUND CARE) ×2 IMPLANT
ELECT BLADE TIP CTD 4 INCH (ELECTRODE) ×2 IMPLANT
ELECT REM PT RETURN 9FT ADLT (ELECTROSURGICAL) ×2
ELECTRODE REM PT RTRN 9FT ADLT (ELECTROSURGICAL) ×1 IMPLANT
FLOSEAL 10ML (HEMOSTASIS) ×1 IMPLANT
GLOVE BIO SURGEON STRL SZ8 (GLOVE) ×4 IMPLANT
GLOVE ECLIPSE 8.0 STRL XLNG CF (GLOVE) ×2 IMPLANT
GLOVE INDICATOR 8.0 STRL GRN (GLOVE) ×4 IMPLANT
GLOVE SURG SS PI 7.0 STRL IVOR (GLOVE) ×1 IMPLANT
GLOVE SURG SS PI 7.5 STRL IVOR (GLOVE) ×3 IMPLANT
GLOVE SURG SS PI 8.0 STRL IVOR (GLOVE) ×1 IMPLANT
GOWN STRL NON-REIN LRG LVL3 (GOWN DISPOSABLE) ×1 IMPLANT
GOWN STRL REIN XL XLG (GOWN DISPOSABLE) ×5 IMPLANT
KIT BASIN OR (CUSTOM PROCEDURE TRAY) ×2 IMPLANT
KIT POSITIONING SURG ANDREWS (MISCELLANEOUS) ×1 IMPLANT
MANIFOLD NEPTUNE II (INSTRUMENTS) ×2 IMPLANT
NDL SPNL 18GX3.5 QUINCKE PK (NEEDLE) ×3 IMPLANT
NEEDLE SPNL 18GX3.5 QUINCKE PK (NEEDLE) ×6 IMPLANT
NS IRRIG 1000ML POUR BTL (IV SOLUTION) ×2 IMPLANT
PATTIES SURGICAL .5 X.5 (GAUZE/BANDAGES/DRESSINGS) ×1 IMPLANT
PATTIES SURGICAL .75X.75 (GAUZE/BANDAGES/DRESSINGS) ×1 IMPLANT
PATTIES SURGICAL 1X1 (DISPOSABLE) IMPLANT
POSITIONER SURGICAL ARM (MISCELLANEOUS) ×2 IMPLANT
SPONGE GAUZE 4X4 12PLY (GAUZE/BANDAGES/DRESSINGS) ×1 IMPLANT
SPONGE LAP 18X18 X RAY DECT (DISPOSABLE) ×1 IMPLANT
SPONGE LAP 4X18 X RAY DECT (DISPOSABLE) ×5 IMPLANT
SPONGE SURGIFOAM ABS GEL 100 (HEMOSTASIS) ×2 IMPLANT
STAPLER VISISTAT 35W (STAPLE) ×1 IMPLANT
SUT VIC AB 1 CT1 27 (SUTURE) ×8
SUT VIC AB 1 CT1 27XBRD ANTBC (SUTURE) ×2 IMPLANT
SUT VIC AB 2-0 CT1 27 (SUTURE) ×4
SUT VIC AB 2-0 CT1 27XBRD (SUTURE) ×2 IMPLANT
TAPE CLOTH SURG 6X10 WHT LF (GAUZE/BANDAGES/DRESSINGS) ×1 IMPLANT
TAPE STRIPS DRAPE STRL (GAUZE/BANDAGES/DRESSINGS) ×1 IMPLANT
TOWEL OR 17X26 10 PK STRL BLUE (TOWEL DISPOSABLE) ×4 IMPLANT
TRAY LAMINECTOMY (CUSTOM PROCEDURE TRAY) ×2 IMPLANT
WATER STERILE IRR 1500ML POUR (IV SOLUTION) ×2 IMPLANT
YANKAUER SUCT BULB TIP NO VENT (SUCTIONS) ×1 IMPLANT

## 2012-11-30 NOTE — Anesthesia Postprocedure Evaluation (Signed)
Anesthesia Post Note  Patient: [name]  Procedure(s) Performed: Procedure(s) (LRB): LUMBAR LAMINECTOMY/DECOMPRESSION MICRODISCECTOMY 2 LEVELS (N/A)  Anesthesia type: General  Patient location: PACU  Post pain: Pain level controlled  Post assessment: Post-op Vital signs reviewed  Last Vitals: BP 96/61  Pulse 83  Temp 36.7 C (Oral)  Resp 16  SpO2 97%  Post vital signs: Reviewed  Level of consciousness: sedated  Complications: No apparent anesthesia complications

## 2012-11-30 NOTE — Brief Op Note (Signed)
11/30/2012  5:43 PM  PATIENT:  [name]  71 y.o. female  PRE-OPERATIVE DIAGNOSIS:  SPINAL STENOSIS   POST-OPERATIVE DIAGNOSIS:  SPINAL STENOSIS   PROCEDURE:  Procedure(s) (LRB) with comments: LUMBAR LAMINECTOMY/DECOMPRESSION MICRODISCECTOMY 2 LEVELS (N/A) - DECOMPRESSION LAMINECTOMY L4-L5, L5-S1 CENTRAL   SURGEON:  Surgeon(s) and Role:    * Drucilla Schmidt, MD - Primary    * Javier Docker, MD - Assisting    * Jacki Cones, MD - Assisting  PHYSICIAN ASSISTANT:   ASSISTANTS:nurse   ANESTHESIA:   general  EBL:  Total I/O In: 1000 [I.V.:1000] Out: 400 [Blood:400]  BLOOD ADMINISTERED:none  DRAINS: none   LOCAL MEDICATIONS USED:  NONE  SPECIMEN:  No Specimen  DISPOSITION OF SPECIMEN:  N/A  COUNTS:  YES  TOURNIQUET:  * No tourniquets in log *  DICTATION: .Other Dictation: Dictation Number [id number]  PLAN OF CARE: Admit to inpatient   PATIENT DISPOSITION:  PACU - hemodynamically stable.   Delay start of Pharmacological VTE agent (>24hrs) due to surgical blood loss or risk of bleeding: yes

## 2012-11-30 NOTE — Transfer of Care (Signed)
Immediate Anesthesia Transfer of Care Note  Patient: [name]  Procedure(s) Performed: Procedure(s) (LRB) with comments: LUMBAR LAMINECTOMY/DECOMPRESSION MICRODISCECTOMY 2 LEVELS (N/A) - DECOMPRESSION LAMINECTOMY L4-L5, L5-S1 CENTRAL   Patient Location: PACU  Anesthesia Type:General  Level of Consciousness: awake, alert , oriented, patient cooperative and responds to stimulation  Airway & Oxygen Therapy: Patient Spontanous Breathing and Patient connected to face mask oxygen  Post-op Assessment: Report given to PACU RN, Post -op Vital signs reviewed and stable and Patient moving all extremities X 4  Post vital signs: Reviewed and stable  Complications: No apparent anesthesia complications

## 2012-11-30 NOTE — H&P (Signed)
[name] is an 71 y.o. female.   Chief Complaint: low back and bilat leg pain [id number] demonstrates spinal stenosis at L4-5  Past Medical History  Diagnosis Date  . Osteoporosis   . Hypertension   . GERD (gastroesophageal reflux disease)   . 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 Difficult intubation     past hx."told has small throat"-no problems with some surgeries    Past Surgical History  Procedure Date  . Abdominal hysterectomy 1983  . Bladder tack [id number]  . Carpel tunnel 1992    right arm  . Rotator cuff repair     two on right, one on left  . Arterial vascular fistula 2005    [facility]  . Trigger finger release     right hand    History reviewed. No pertinent family history. Social History:  reports that she quit smoking about 42 years ago. Her smoking use included Cigarettes. She smoked .5 packs per day. She does not have any smokeless tobacco history on file. She reports that she drinks alcohol. She reports that she does not use illicit drugs.  Allergies:  Allergies  Allergen Reactions  . Codeine Nausea And Vomiting  . Morphine And Related Nausea And Vomiting    Medications Prior to Admission  Medication Sig Dispense Refill  . amLODipine (NORVASC) 5 MG tablet Take 5 mg by mouth every morning.       Marland Kitchen omeprazole-sodium bicarbonate (ZEGERID) 40-1100 MG per capsule Take 1 capsule by mouth daily before breakfast.      . primidone (MYSOLINE) 50 MG tablet Take 50 mg by mouth Nightly.      . traMADol (ULTRAM) 50 MG tablet Take 50 mg by mouth every 12 (twelve) hours as needed.      . venlafaxine XR (EFFEXOR-XR) 150 MG 24 hr capsule Take 150 mg by mouth every morning.       Marland Kitchen aspirin 81 MG tablet Take 81 mg by mouth daily.      . calcium-vitamin D (OSCAL) 250-125 MG-UNIT per tablet Take 1 tablet by mouth 2 (two) times daily.      . Cholecalciferol (VITAMIN D-3) 1000 UNITS CAPS Take by mouth daily.      . Coenzyme Q10 (CO Q 10) 100 MG CAPS Take  100 mg by mouth daily.      . Multiple Vitamin (MULTIVITAMIN WITH MINERALS) TABS Take 1 tablet by mouth daily.      . naproxen sodium (ANAPROX) 220 MG tablet Take 440 mg by mouth as needed.       . vitamin B-12 (CYANOCOBALAMIN) 500 MCG tablet Take 500 mcg by mouth daily.      . zoledronic acid (RECLAST) 5 MG/100ML SOLN Inject 5 mg into the vein once. A year in the fall        No results found for this or any previous visit (from the past 48 hour(s)). No results found.  ROS  Blood pressure 124/77, pulse 81, temperature 98.1 F (36.7 C), temperature source Oral, resp. rate 20, SpO2 95.00%. Physical Exam  Constitutional: She is oriented to person, place, and time. She appears well-developed and well-nourished.  HENT:  Head: Normocephalic and atraumatic.  Right Ear: External ear normal.  Left Ear: External ear normal.  Nose: Nose normal.  Mouth/Throat: Oropharynx is clear and moist.  Eyes: Conjunctivae normal and EOM are normal. Pupils are equal, round, and reactive to light.  Neck: Normal range of motion. Neck supple.  Cardiovascular: Normal rate, regular rhythm, normal heart sounds and intact distal pulses.   Respiratory: Effort normal and breath sounds normal.  GI: Soft. Bowel sounds are normal.  Musculoskeletal:       Decreased ROM at waist  Neurological: She is alert and oriented to person, place, and time. She has normal reflexes.  Skin: Skin is warm and dry.  Psychiatric: She has a normal mood and affect. Her behavior is normal. Judgment and thought content normal.     Assessment/Plan Spinal stenosis L4-5 Decompressive lamin L4-5  APLINGTON,JAMES P 11/30/2012, 2:04 PM

## 2012-11-30 NOTE — Preoperative (Signed)
Beta Blockers   Reason not to administer Beta Blockers:Not Applicable 

## 2012-11-30 NOTE — Plan of Care (Signed)
Problem: Consults Goal: Diagnosis - Spinal Surgery Outcome: Completed/Met Date Met:  11/30/12 Lumbar Laminectomy (Complex)     

## 2012-11-30 NOTE — Anesthesia Preprocedure Evaluation (Addendum)
Anesthesia Evaluation    History of Anesthesia Complications (+) DIFFICULT AIRWAY  Airway Mallampati: II TM Distance: >3 FB Neck ROM: Full    Dental  (+) Dental Advisory Given and Teeth Intact   Pulmonary former smoker,  breath sounds clear to auscultation  Pulmonary exam normal       Cardiovascular hypertension, Pt. on medications Rhythm:Regular Rate:Normal     Neuro/Psych negative neurological ROS  negative psych ROS   GI/Hepatic Neg liver ROS, GERD-  Medicated,  Endo/Other  negative endocrine ROS  Renal/GU negative Renal ROS     Musculoskeletal negative musculoskeletal ROS (+)   Abdominal   Peds  Hematology negative hematology ROS (+)   Anesthesia Other Findings   Reproductive/Obstetrics                        Anesthesia Physical Anesthesia Plan  ASA: II  Anesthesia Plan: General   Post-op Pain Management:    Induction: Intravenous  Airway Management Planned: Oral ETT and Video Laryngoscope Planned  Additional Equipment:   Intra-op Plan:   Post-operative Plan: Extubation in OR  Informed Consent: I have reviewed the patients History and Physical, chart, labs and discussed the procedure including the risks, benefits and alternatives for the proposed anesthesia with the patient or authorized representative who has indicated his/her understanding and acceptance.   Dental advisory given  Plan Discussed with: CRNA  Anesthesia Plan Comments:         Anesthesia Quick Evaluation

## 2012-12-01 ENCOUNTER — Encounter (HOSPITAL_COMMUNITY): Payer: Self-pay | Admitting: Orthopedic Surgery

## 2012-12-01 DIAGNOSIS — Z79899 Other long term (current) drug therapy: Secondary | ICD-10-CM | POA: Diagnosis not present

## 2012-12-01 DIAGNOSIS — K219 Gastro-esophageal reflux disease without esophagitis: Secondary | ICD-10-CM | POA: Diagnosis not present

## 2012-12-01 DIAGNOSIS — I10 Essential (primary) hypertension: Secondary | ICD-10-CM | POA: Diagnosis not present

## 2012-12-01 DIAGNOSIS — M810 Age-related osteoporosis without current pathological fracture: Secondary | ICD-10-CM | POA: Diagnosis not present

## 2012-12-01 DIAGNOSIS — M5126 Other intervertebral disc displacement, lumbar region: Secondary | ICD-10-CM | POA: Diagnosis not present

## 2012-12-01 DIAGNOSIS — Z7982 Long term (current) use of aspirin: Secondary | ICD-10-CM | POA: Diagnosis not present

## 2012-12-01 MED ORDER — ACETAMINOPHEN 325 MG PO TABS
ORAL_TABLET | ORAL | Status: AC
  Administered 2012-12-01: 650 mg via ORAL
  Filled 2012-12-01: qty 2

## 2012-12-01 MED ORDER — ACETAMINOPHEN 325 MG PO TABS
650.0000 mg | ORAL_TABLET | Freq: Four times a day (QID) | ORAL | Status: DC | PRN
  Administered 2012-12-01: 650 mg via ORAL

## 2012-12-01 MED ORDER — ALBUTEROL SULFATE (5 MG/ML) 0.5% IN NEBU
2.5000 mg | INHALATION_SOLUTION | Freq: Four times a day (QID) | RESPIRATORY_TRACT | Status: DC | PRN
  Administered 2012-12-01: 2.5 mg via RESPIRATORY_TRACT
  Filled 2012-12-01: qty 0.5

## 2012-12-01 MED ORDER — TRAMADOL HCL 50 MG PO TABS
50.0000 mg | ORAL_TABLET | ORAL | Status: DC | PRN
  Administered 2012-12-01 (×2): 100 mg via ORAL
  Filled 2012-12-01 (×2): qty 2

## 2012-12-01 NOTE — Progress Notes (Signed)
Patient ID: [name], female   DOB: 10/16/1941, 71 y.o.   MRN: [id number] POD 1--leg pain gone.  Minimal fever.  PT has seen her.  Probably home 12/21.  Inst given.

## 2012-12-01 NOTE — Op Note (Signed)
NAMEFRANCELY, CRAW NO.:  [id number]  MEDICAL RECORD NO.:  [id number]  LOCATION:  1601                         FACILITY:  [facility]  PHYSICIAN:  Marlowe Kays, M.D.  DATE OF BIRTH:  30-Mar-1941  DATE OF PROCEDURE: DATE OF DISCHARGE:                              OPERATIVE REPORT   PREOPERATIVE DIAGNOSIS:  Spinal stenosis, L4-5, L5-S1.  POSTOPERATIVE DIAGNOSIS:  Spinal stenosis, L4-5, L5-S1.  OPERATION:  Decompressive laminectomy with microscope at L4-5 and L5-S1.  SURGEON:  Marlowe Kays, M.D.  ASSISTANT:  Georges Lynch. Darrelyn Hillock, M.D.  ANESTHESIA:  General.  PATHOLOGY AND JUSTIFICATION FOR PROCEDURE:  As stated diagnosis, she was having bilateral leg pain with a myelogram demonstrated major defected at L4-5 and significant but less severe spinal stenosis at L5-S1.  These findings were confirmed at surgery.  PROCEDURE:  Prophylactic antibiotics, satisfied general anesthesia, prone position on rolls, back was prepped with DuraPrep.  With 2 spinal needles and lateral x-ray, I tentatively located the operative field. Time-out was performed.  We continued draping the back in sterile field. Ioban employed.  Vertical midline incision.  Once down to the spinous processes, I tagged with Kocher clamps, and found they are actually on L5 and the sacrum, so I continued the incision more cephalad and located L4.  We thought it was L4 and I then dissected the soft tissue off the neural arches from L4 to the sacrum.  Placed self-retaining McCullough retractors.  I took a second x-ray, tagging what we thought the spinous processes of L4 and L5, and this confirmed our operative field.  We then continued dissecting the soft tissue off the neural arches and began removing them along with the spinous processes with double-action rongeur.  When we got to the place where the dissection was a little more precise, we brought in the microscope and continued decompressing from L4 to  the sacrum with confirmatory x-ray with instruments in place, indicating that this was that we had corrected the decompression as planned.  All foramina were patent to hockey-stick thereafter.  There was no unusual bleeding.  I did use some FloSeal through the case because there had been some cancellous bone bleeding earlier.  Wound was then irrigated with sterile saline and Gelfoam soaked in thrombin was placed over the dura.  Self-retaining retractors were removed.  I then closed the fascia with interrupted #1 Vicryl leaving 2 cm aperture distally, followed by a #1 Vicryl in the deep subcutaneous tissue 2-0 Vicryl superficially and staples in the skin.  Betadine, Adaptic, dry sterile dressing were applied.  She tolerated the procedure well at the time of dictation, she was on the way to recovery room in satisfactory condition with no known complications.          ______________________________ Marlowe Kays, M.D.     JA/MEDQ  D:  11/30/2012  T:  12/01/2012  Job:  [id number]

## 2012-12-01 NOTE — Progress Notes (Signed)
12/01/12 1300  PT Visit Information  Last PT Received On 12/01/12  Assistance Needed +1  PT Time Calculation  PT Start Time 1331  PT Stop Time 1344  PT Time Calculation (min) 13 min  Subjective Data  Subjective I will do whatever you want  Patient Stated Goal home  Precautions  Precautions Back  Precaution Booklet Issued Yes (comment)  Precaution Comments reviewed back precautions with pt and husband  Cognition  Overall Cognitive Status Appears within functional limits for tasks assessed/performed  Arousal/Alertness Awake/alert  Orientation Level Appears intact for tasks assessed  Behavior During Session [facility] for tasks performed  Bed Mobility  Bed Mobility Sit to Sidelying Right  Sit to Sidelying Right 4: Min guard  Details for Bed Mobility Assistance cues for log rolling  Transfers  Transfers Sit to Stand;Stand to Sit  Sit to Stand 4: Min guard;5: Supervision  Stand to Sit 4: Min guard;5: Supervision  Details for Transfer Assistance cues for hand placement  Ambulation/Gait  Ambulation/Gait Assistance 4: Min guard;5: Supervision  Ambulation Distance (Feet) 160 Feet  Assistive device Rolling walker  Ambulation/Gait Assistance Details cues  for posture, hip extension and RW distance from self  Gait Pattern Decreased step length - right;Decreased step length - left (hips flexed)  PT - End of Session  Activity Tolerance Patient tolerated treatment well  Patient left in bed;with call bell/phone within reach;with family/visitor present  PT - Assessment/Plan  Comments on Treatment Session pt progressing; pain controlled; stairs in am   PT Plan Discharge plan remains appropriate;Frequency remains appropriate  PT Frequency (1-2visits)  Follow Up Recommendations Home health PT;Supervision for mobility/OOB  PT equipment None recommended by PT  Acute Rehab PT Goals  Time For Goal Achievement 12/03/12  Potential to Achieve Goals Good  Pt will go Sit to Supine/Side with supervision   PT Goal: Sit to Supine/Side - Progress Progressing toward goal  Pt will go Sit to Stand with supervision  PT Goal: Sit to Stand - Progress Progressing toward goal  Pt will go Stand to Sit with supervision  PT Goal: Stand to Sit - Progress Progressing toward goal  Pt will Ambulate 51 - 150 feet;with supervision;with least restrictive assistive device  PT Goal: Ambulate - Progress Progressing toward goal  PT General Charges  $$ ACUTE PT VISIT 1 Procedure  PT Treatments  $Gait Training 8-22 mins

## 2012-12-01 NOTE — Progress Notes (Signed)
CSW consulted for SNF placement. PN reviewed. Pt plans to return home following hospital d/c. RNCM is assisting with d/c planning needs at this time.  Cori Razor LCSW [phone]

## 2012-12-01 NOTE — Progress Notes (Signed)
Utilization review completed.  

## 2012-12-01 NOTE — Progress Notes (Signed)
Comments:  12/01/2012 Colleen Can BSN RN CCM [phone] Pt states she will have assistance from daughter and also has housekeeper that can assist her as needed. List of private duty self pay agency given to patient to use if desired.

## 2012-12-01 NOTE — Evaluation (Signed)
Occupational Therapy Evaluation Patient Details Name: [name] MRN: [id number] DOB: [birth year] Today's Date: 12/01/2012 Time: 1001-1015 OT Time Calculation (min): 14 min  OT Assessment / Plan / Recommendation Clinical Impression  This 71 year old female was admitted for L4-5 and L5-S1 decompression.  She will benefit from continued OT to increase independence with adls with supervision level goals in acute.      OT Assessment  Patient needs continued OT Services    Follow Up Recommendations  Home health OT    Barriers to Discharge      Equipment Recommendations  None recommended by OT    Recommendations for Other Services    Frequency  Min 2X/week    Precautions / Restrictions Precautions Precautions: Back Precaution Booklet Issued: Yes (comment) Restrictions Weight Bearing Restrictions: No   Pertinent Vitals/Pain Back sore:  Premedicated.  Tired from medication and wanted to get Back to bed    ADL  Grooming: Simulated;Set up Where Assessed - Grooming: Supported sitting Upper Body Bathing: Simulated;Supervision/safety Where Assessed - Upper Body Bathing: Supported sitting Lower Body Bathing: Simulated;Minimal assistance Where Assessed - Lower Body Bathing: Supported sit to stand Upper Body Dressing: Simulated;Supervision/safety Where Assessed - Upper Body Dressing: Supported sitting Lower Body Dressing: Simulated;Minimal assistance Where Assessed - Lower Body Dressing: Supported sit to Pharmacist, hospital: Simulated;Min Pension scheme manager Method: Stand pivot (chair to bed) Toileting - Clothing Manipulation and Hygiene: Simulated;Minimal assistance Where Assessed - Toileting Clothing Manipulation and Hygiene: Standing Transfers/Ambulation Related to ADLs: Transferred back to bed:  fatiqued ADL Comments: Reviewed adls and back precautions.  Pt practiced with sock aid.  She is able to cross RLE without pulling but not L.  She wants to be independent.  Pt may  also need a toilet aid.  Educated on resources    OT Diagnosis: Generalized weakness  OT Problem List: Decreased activity tolerance;Decreased knowledge of use of DME or AE;Decreased knowledge of precautions;Pain OT Treatment Interventions: Self-care/ADL training;Therapeutic activities;Patient/family education;DME and/or AE instruction   OT Goals Acute Rehab OT Goals OT Goal Formulation: With patient Time For Goal Achievement: 12/08/12 Potential to Achieve Goals: Good ADL Goals Pt Will Perform Lower Body Bathing: with supervision;Sit to stand from chair;with adaptive equipment ADL Goal: Lower Body Bathing - Progress: Goal set today Pt Will Perform Lower Body Dressing: with supervision;Sit to stand from chair;with adaptive equipment ADL Goal: Lower Body Dressing - Progress: Goal set today Pt Will Transfer to Toilet: with supervision;Ambulation;Comfort height toilet;Maintaining back safety precautions ADL Goal: Toilet Transfer - Progress: Goal set today Pt Will Perform Toileting - Hygiene: with supervision;Sit to stand from 3-in-1/toilet;with adaptive equipment ADL Goal: Toileting - Hygiene - Progress: Goal set today Pt Will Perform Tub/Shower Transfer: Shower transfer;with supervision;Ambulation ADL Goal: Tub/Shower Transfer - Progress: Goal set today  Visit Information  Last OT Received On: 12/01/12 Assistance Needed: +1    Subjective Data  Subjective: I want to be as independent as possible Patient Stated Goal: see above   Prior Functioning     Home Living Lives With: Spouse Available Help at Discharge: Family;Available 24 hours/day Type of Home: House Home Access: Stairs to enter [employer] of Steps: 2 Entrance Stairs-Rails: None Home Layout: One level Bathroom Shower/Tub: Tub/shower unit;Walk-in shower Bathroom Toilet: Handicapped height Home Adaptive Equipment: Walker - rolling Prior Function Level of Independence:  Independent Communication Communication: No difficulties Dominant Hand: Right         Vision/Perception     Cognition  Overall Cognitive Status: Appears within functional  limits for tasks assessed/performed Arousal/Alertness: Awake/alert Orientation Level: Appears intact for tasks assessed Behavior During Session: [facility] for tasks performed    Extremity/Trunk Assessment Right Upper Extremity Assessment RUE ROM/Strength/Tone: [facility] for tasks assessed;Due to precautions Left Upper Extremity Assessment LUE ROM/Strength/Tone: Within functional levels;Due to precautions Right Lower Extremity Assessment RLE ROM/Strength/Tone: [facility] for tasks assessed Left Lower Extremity Assessment LLE ROM/Strength/Tone: [facility] for tasks assessed     Mobility Bed Mobility Bed Mobility: Rolling Right;Right Sidelying to Sit Right Sidelying to Sit: 4: Min guard Supine to Sit: 4: Min guard Sit to Supine: 4: Min guard Details for Bed Mobility Assistance: cues for log rolling Transfers Sit to Stand: 4: Min guard Stand to Sit: 4: Min guard;To chair/3-in-1 Details for Transfer Assistance: cues for hand placement     Shoulder Instructions     Exercise     Balance Static Standing Balance Static Standing - Balance Support: During functional activity Static Standing - Level of Assistance: 5: Stand by assistance   End of Session OT - End of Session Activity Tolerance: Patient limited by fatigue Patient left: in bed;with call bell/phone within reach  GO Functional Assessment Tool Used: clinical judgment/observation Functional Limitation: Self care Self Care Current Status (Z6109): At least 20 percent but less than 40 percent impaired, limited or restricted Self Care Goal Status (U0454): At least 1 percent but less than 20 percent impaired, limited or restricted   [facility] 12/01/2012, 11:39 AM Marica Otter, OTR/L [phone] 12/01/2012

## 2012-12-01 NOTE — Evaluation (Signed)
Physical Therapy Evaluation Patient Details Name: [name] MRN: [id number] DOB: 1941/07/10 Today's Date: 12/01/2012 Time: 0909-0950 PT Time Calculation (min): 41 min  PT Assessment / Plan / Recommendation Clinical Impression  pt s/p L4-5, L5-S1 and will benefit from PT to maximize  independence for home, will benefit form HHPT    PT Assessment  Patient needs continued PT services    Follow Up Recommendations  Home health PT;Supervision for mobility/OOB    Does the patient have the potential to tolerate intense rehabilitation      Barriers to Discharge        Equipment Recommendations  None recommended by PT    Recommendations for Other Services     Frequency  (1-2 visits)    Precautions / Restrictions Precautions Precautions: Back Precaution Booklet Issued: Yes (comment)   Pertinent Vitals/Pain       Mobility  Bed Mobility Bed Mobility: Rolling Right;Right Sidelying to Sit Right Sidelying to Sit: 4: Min guard Supine to Sit: 4: Min guard Details for Bed Mobility Assistance: cues for log rolling Transfers Transfers: Sit to Stand;Stand to Sit;Stand Pivot Transfers Sit to Stand: 4: Min guard;From bed;From chair/3-in-1 Stand to Sit: 4: Min guard;To chair/3-in-1 Stand Pivot Transfers: 4: Min guard Details for Transfer Assistance: cues for hand placement Ambulation/Gait Ambulation/Gait Assistance: 4: Min guard;5: Supervision Ambulation Distance (Feet): 120 Feet Assistive device: Rolling walker Ambulation/Gait Assistance Details: cues for posture and RW distance from self Gait Pattern: Step-to pattern;Step-through pattern Gait velocity: decreased   Assisted pt with LB dressing ( per her request), min assist;    Shoulder Instructions     Exercises     PT Diagnosis: Difficulty walking  PT Problem List: Decreased strength;Decreased range of motion;Decreased activity tolerance;Decreased balance;Decreased mobility;Decreased knowledge of use of DME;Decreased  knowledge of precautions PT Treatment Interventions: DME instruction;Gait training;Functional mobility training;Therapeutic activities;Therapeutic exercise;Patient/family education;Stair training   PT Goals Acute Rehab PT Goals PT Goal Formulation: With patient Time For Goal Achievement: 12/03/12 Potential to Achieve Goals: Good Pt will Roll Supine to Right Side: with supervision PT Goal: Rolling Supine to Right Side - Progress: Goal set today Pt will go Supine/Side to Sit: with supervision PT Goal: Supine/Side to Sit - Progress: Goal set today Pt will go Sit to Stand: with supervision PT Goal: Sit to Stand - Progress: Goal set today Pt will go Stand to Sit: with supervision PT Goal: Stand to Sit - Progress: Goal set today Pt will Ambulate: 51 - 150 feet;with supervision;with least restrictive assistive device PT Goal: Ambulate - Progress: Goal set today Pt will Go Up / Down Stairs: 1-2 stairs;with min assist;with least restrictive assistive device PT Goal: Up/Down Stairs - Progress: Goal set today  Visit Information  Last PT Received On: 12/01/12 Assistance Needed: +1    Subjective Data  Subjective: i am so sleepy Patient Stated Goal: home   Prior Functioning  Home Living Lives With: Spouse Available Help at Discharge: Family;Available 24 hours/day Type of Home: House Home Access: Stairs to enter [employer] of Steps: 2 Entrance Stairs-Rails: None Home Layout: One level Home Adaptive Equipment: Walker - rolling Prior Function Level of Independence: Independent    Cognition  Overall Cognitive Status: Appears within functional limits for tasks assessed/performed Arousal/Alertness: Awake/alert Orientation Level: Appears intact for tasks assessed Behavior During Session: [facility] for tasks performed    Extremity/Trunk Assessment Right Upper Extremity Assessment RUE ROM/Strength/Tone: [facility] for tasks assessed Left Upper Extremity Assessment LUE ROM/Strength/Tone:  [facility] for tasks assessed Right Lower Extremity  Assessment RLE ROM/Strength/Tone: [facility] for tasks assessed Left Lower Extremity Assessment LLE ROM/Strength/Tone: [facility] for tasks assessed   Balance Static Standing Balance Static Standing - Balance Support: During functional activity Static Standing - Level of Assistance: 5: Stand by assistance  End of Session PT - End of Session Equipment Utilized During Treatment: Gait belt Activity Tolerance: Patient tolerated treatment well Patient left: in chair  GP Functional Assessment Tool Used: clinical judgement Functional Limitation: Mobility: Walking and moving around Mobility: Walking and Moving Around Current Status (W0981): At least 1 percent but less than 20 percent impaired, limited or restricted Mobility: Walking and Moving Around Goal Status [phone]): At least 1 percent but less than 20 percent impaired, limited or restricted   [facility] 12/01/2012, 10:02 AM

## 2012-12-01 NOTE — Care Management Note (Signed)
    Page 1 of 2   12/01/2012     3:11:17 PM   CARE MANAGEMENT NOTE 12/01/2012  Patient:  [name], [name]   Account Number:  [id number]  Date Initiated:  12/01/2012  Documentation initiated by:  Colleen Can  Subjective/Objective Assessment:   DX SPINAL STENOSIS; DECPMPRESSIVE LUMBAR LAMINECTOMY     Action/Plan:   CM spoke with patient. Plans are for patient to return to her home in [place] where spouse will be caregiver. Already has RW. No HH needs assessed.   Anticipated DC Date:  12/01/2012   Anticipated DC Plan:  HOME W HOME HEALTH SERVICES  In-house referral  NA      DC Planning Services  CM consult      [facility] Choice  NA   Choice offered to / List presented to:  NA   DME arranged  NA      DME agency  NA     HH arranged  NA      HH agency  NA   Status of service:  Completed, signed off Medicare Important Message given?   (If response is "NO", the following Medicare IM given date fields will be blank) Date Medicare IM given:   Date Additional Medicare IM given:    Discharge Disposition:    Per UR Regulation:    If discussed at Long Length of Stay Meetings, dates discussed:    Comments:

## 2012-12-02 DIAGNOSIS — Z79899 Other long term (current) drug therapy: Secondary | ICD-10-CM | POA: Diagnosis not present

## 2012-12-02 DIAGNOSIS — K219 Gastro-esophageal reflux disease without esophagitis: Secondary | ICD-10-CM | POA: Diagnosis not present

## 2012-12-02 DIAGNOSIS — M810 Age-related osteoporosis without current pathological fracture: Secondary | ICD-10-CM | POA: Diagnosis not present

## 2012-12-02 DIAGNOSIS — M5126 Other intervertebral disc displacement, lumbar region: Secondary | ICD-10-CM | POA: Diagnosis not present

## 2012-12-02 DIAGNOSIS — Z7982 Long term (current) use of aspirin: Secondary | ICD-10-CM | POA: Diagnosis not present

## 2012-12-02 DIAGNOSIS — I10 Essential (primary) hypertension: Secondary | ICD-10-CM | POA: Diagnosis not present

## 2012-12-02 MED ORDER — METHOCARBAMOL 500 MG PO TABS: 500.0000 mg | ORAL_TABLET | Freq: Four times a day (QID) | ORAL | Status: DC | PRN

## 2012-12-02 NOTE — Progress Notes (Signed)
Physical Therapy Treatment Patient Details Name: FYNN ADEL MRN: [id number] DOB: [birth year] Today's Date: 12/02/2012 Time: 4098-1191 PT Time Calculation (min): 36 min  PT Assessment / Plan / Recommendation Comments on Treatment Session  Pt. is improved with mobility today. Has an abnormal trendelenburg gait, R> L> pt. Is to be discharged to home.    Follow Up Recommendations  No PT follow up     Does the patient have the potential to tolerate intense rehabilitation     Barriers to Discharge        Equipment Recommendations  None recommended by PT    Recommendations for Other Services    Frequency     Plan Discharge plan remains appropriate    Precautions / Restrictions Precautions Precautions: Back Precaution Comments: pt stated 2/3 precautions Restrictions Weight Bearing Restrictions: No   Pertinent Vitals/Pain No pain    Mobility  Bed Mobility Bed Mobility: Rolling Left;Left Sidelying to Sit Rolling Left: 5: Supervision;With rail Left Sidelying to Sit: 5: Supervision Details for Bed Mobility Assistance: cues for log rollinf Transfers Sit to Stand: 5: Supervision;From bed;From toilet Stand to Sit: To toilet;With upper extremity assist;To chair/3-in-1 Details for Transfer Assistance: cues for hand placement  Ambulation/Gait Ambulation/Gait Assistance: 4: Min guard Ambulation Distance (Feet): 200 Feet Assistive device: Rolling walker Ambulation/Gait Assistance Details: cues for posture. Pt' demonstrates R>L trendelenburg ,Pt. ambulated around room with no RW. cues for precautions as she was getting clothes out of closet and donning them, Gait Pattern: Lateral hip instability    Exercises     PT Diagnosis:    PT Problem List:   PT Treatment Interventions:     PT Goals Acute Rehab PT Goals Pt will go Supine/Side to Sit: with supervision PT Goal: Supine/Side to Sit - Progress: Met Pt will go Sit to Stand: with supervision PT Goal: Sit to Stand -  Progress: Met Pt will go Stand to Sit: with supervision PT Goal: Stand to Sit - Progress: Met Pt will Ambulate: 51 - 150 feet;with least restrictive assistive device PT Goal: Ambulate - Progress: Met Pt will Go Up / Down Stairs:  (pt declined need to practice.)  Visit Information  Last PT Received On: 12/02/12 Assistance Needed: +1    Subjective Data  Subjective: i am ready to walk   Cognition  Overall Cognitive Status: Appears within functional limits for tasks assessed/performed    Balance     End of Session PT - End of Session Activity Tolerance: Patient tolerated treatment well Patient left: in chair;with call bell/phone within reach Nurse Communication: Mobility status   GP Mobility: Walking and Moving Around Discharge Status [id number]): At least 1 percent but less than 20 percent impaired, limited or restricted   Rada Hay 12/02/2012, 10:33 AM

## 2012-12-02 NOTE — Progress Notes (Signed)
Discharged from floor via w/c, spouse with pt. No changes in assessment. [name]   

## 2012-12-02 NOTE — Progress Notes (Signed)
Physician Discharge Summary  Patient ID: [name] MRN: [id number] DOB/AGE: Feb 04, 1941 71 y.o.  Admit date: 11/30/2012 Discharge date: 12/02/2012  Admission Diagnoses:  Lumbar pain with herniated disks  Discharge Diagnoses:  Same   Surgeries: Procedure(s): LUMBAR LAMINECTOMY/DECOMPRESSION MICRODISCECTOMY 2 LEVELS on 11/30/2012   Consultants: PT/OT  Discharged Condition: Stable  Hospital Course: [name] is an 71 y.o. female who was admitted 11/30/2012 with a chief complaint of No chief complaint on file. , and found to have a diagnosis of <principal problem not specified>.  They were brought to the operating room on 11/30/2012 and underwent the above named procedures.    The patient had an uncomplicated hospital course and was stable for discharge.  Recent vital signs:  Filed Vitals:   12/02/12 0444  BP: 129/75  Pulse: 88  Temp: 98.9 F (37.2 C)  Resp: 16    Recent laboratory studies:  Results for orders placed during the hospital encounter of 11/24/12  BASIC METABOLIC PANEL      Component Value Range   Sodium 138  135 - 145 mEq/L   Potassium 3.2 (*) 3.5 - 5.1 mEq/L   Chloride 102  96 - 112 mEq/L   CO2 26  19 - 32 mEq/L   Glucose, Bld 104 (*) 70 - 99 mg/dL   BUN 14  6 - 23 mg/dL   Creatinine, Ser 3.08  0.50 - 1.10 mg/dL   Calcium 8.6  8.4 - 65.7 mg/dL   GFR calc non Af Amer >90  >90 mL/min   GFR calc Af Amer >90  >90 mL/min  CBC      Component Value Range   WBC 7.3  4.0 - 10.5 K/uL   RBC 4.29  3.87 - 5.11 MIL/uL   Hemoglobin 13.4  12.0 - 15.0 g/dL   HCT 84.6  96.2 - 95.2 %   MCV 91.4  78.0 - 100.0 fL   MCH 31.2  26.0 - 34.0 pg   MCHC 34.2  30.0 - 36.0 g/dL   RDW 84.1  32.4 - 40.1 %   Platelets 219  150 - 400 K/uL  SURGICAL PCR SCREEN      Component Value Range   MRSA, PCR NEGATIVE  NEGATIVE   Staphylococcus aureus NEGATIVE  NEGATIVE    Discharge Medications:     Medication List     As of 12/02/2012  7:16 AM    TAKE these  medications         amLODipine 5 MG tablet   Commonly known as: NORVASC   Take 5 mg by mouth every morning.      aspirin 81 MG tablet   Take 81 mg by mouth daily.      calcium-vitamin D 250-125 MG-UNIT per tablet   Commonly known as: OSCAL   Take 1 tablet by mouth 2 (two) times daily.      Co Q 10 100 MG Caps   Take 100 mg by mouth daily.      methocarbamol 500 MG tablet   Commonly known as: ROBAXIN   Take 1 tablet (500 mg total) by mouth every 6 (six) hours as needed.      multivitamin with minerals Tabs   Take 1 tablet by mouth daily.      naproxen sodium 220 MG tablet   Commonly known as: ANAPROX   Take 440 mg by mouth as needed.      omeprazole-sodium bicarbonate 40-1100 MG per capsule   Commonly known as: ZEGERID  Take 1 capsule by mouth daily before breakfast.      primidone 50 MG tablet   Commonly known as: MYSOLINE   Take 50 mg by mouth Nightly.      traMADol 50 MG tablet   Commonly known as: ULTRAM   Take 50 mg by mouth every 12 (twelve) hours as needed.      venlafaxine XR 150 MG 24 hr capsule   Commonly known as: EFFEXOR-XR   Take 150 mg by mouth every morning.      vitamin B-12 500 MCG tablet   Commonly known as: CYANOCOBALAMIN   Take 500 mcg by mouth daily.      Vitamin D-3 1000 UNITS Caps   Take by mouth daily.      zoledronic acid 5 MG/100ML Soln   Commonly known as: RECLAST   Inject 5 mg into the vein once. A year in the fall        Diagnostic Studies: Dg Chest 2 View  11/24/2012  *RADIOLOGY REPORT*  Clinical Data: Hypertension.  CHEST - 2 VIEW  Comparison: March 02, 2007.  Findings: Cardiomediastinal silhouette appears normal.  No acute pulmonary disease is noted. Postsurgical changes are seen involving the proximal right humerus.  IMPRESSION: No acute cardiopulmonary abnormality seen.   Original Report Authenticated By: Lupita Raider.,  M.D.    Dg Spine Portable 1 View  11/30/2012  *RADIOLOGY REPORT*  Clinical Data: Back surgery   PORTABLE SPINE - 1 VIEW  Comparison: 11/30/2012  Findings: Surgical instrument under the L4 lamina.  Second instrument under the L5 lamina directed towards S1 vertebral body.  Grade 1 anterior slip L4-5.  IMPRESSION: Lumbar localization as above.   Original Report Authenticated By: Janeece Riggers, M.D.    Dg Spine Portable 1 View  11/30/2012  *RADIOLOGY REPORT*  Clinical Data: Surgical level L4-5 and L5-S1  PORTABLE SPINE - 1 VIEW  Comparison: 11/30/2012  Findings: Clamp on the L4 spinous process.  Clamp on the L5 spinous process.  Instrument under the L5 lamina directed towards L5-S1.  IMPRESSION: Surgical localization as above.   Original Report Authenticated By: Janeece Riggers, M.D.    Dg Spine Portable 1 View  11/30/2012  *RADIOLOGY REPORT*  Clinical Data: Intraoperative localization for spine surgery.  PORTABLE SPINE - 1 VIEW  Comparison: Earlier films, same date.  Findings: There are surgical instruments marking the S1 and S2 levels.  IMPRESSION: S1 and S2 marked intraoperatively.   Original Report Authenticated By: Rudie Meyer, M.D.    Dg Spine Portable 1 View  11/30/2012  *RADIOLOGY REPORT*  Clinical Data: Surgical level L4-5.  L5-S1.  PORTABLE SPINE - 1 VIEW  Comparison: CT myelogram 07/11/2012  Findings: Two needles in the soft tissues directed at the L4 and L5 spinous processes.  Grade 1 anterior slip L4-5.  Disc degeneration at L4-5 and L5-S1. No fracture.  IMPRESSION: Needle directed towards the L4 and L5 spinous processes.   Original Report Authenticated By: Janeece Riggers, M.D.     Disposition: 01-Home or Self Care      Discharge Orders    Future Orders Please Complete By Expires   Diet - low sodium heart healthy      Call MD / Call 911      Comments:   If you experience chest pain or shortness of breath, CALL 911 and be transported to the hospital emergency room.  If you develope a fever above 101 F, pus (white drainage) or increased drainage or redness at the wound,  or calf pain,  call your surgeon's office.   Constipation Prevention      Comments:   Drink plenty of fluids.  Prune juice may be helpful.  You may use a stool softener, such as Colace (over the counter) 100 mg twice a day.  Use MiraLax (over the counter) for constipation as needed.   Increase activity slowly as tolerated            Signed: DIXON,THOMAS B 12/02/2012, 7:16 AM

## 2012-12-02 NOTE — Discharge Summary (Signed)
Physician Discharge Summary  Patient ID: [name] MRN: [id number] DOB/AGE: 1941-01-19 71 y.o.  Admit date: 11/30/2012 Discharge date: 12/02/2012  Admission Diagnoses:  Lumbar back pain with herniated disk  Discharge Diagnoses:  Same   Surgeries: Procedure(s): LUMBAR LAMINECTOMY/DECOMPRESSION MICRODISCECTOMY 2 LEVELS on 11/30/2012   Consultants: PT/OT  Discharged Condition: Stable  Hospital Course: [name] is an 71 y.o. female who was admitted 11/30/2012 with a chief complaint of No chief complaint on file. , and found to have a diagnosis of <principal problem not specified>.  They were brought to the operating room on 11/30/2012 and underwent the above named procedures.    The patient had an uncomplicated hospital course and was stable for discharge.  Recent vital signs:  Filed Vitals:   12/02/12 0444  BP: 129/75  Pulse: 88  Temp: 98.9 F (37.2 C)  Resp: 16    Recent laboratory studies:  Results for orders placed during the hospital encounter of 11/24/12  BASIC METABOLIC PANEL      Component Value Range   Sodium 138  135 - 145 mEq/L   Potassium 3.2 (*) 3.5 - 5.1 mEq/L   Chloride 102  96 - 112 mEq/L   CO2 26  19 - 32 mEq/L   Glucose, Bld 104 (*) 70 - 99 mg/dL   BUN 14  6 - 23 mg/dL   Creatinine, Ser 6.57  0.50 - 1.10 mg/dL   Calcium 8.6  8.4 - 84.6 mg/dL   GFR calc non Af Amer >90  >90 mL/min   GFR calc Af Amer >90  >90 mL/min  CBC      Component Value Range   WBC 7.3  4.0 - 10.5 K/uL   RBC 4.29  3.87 - 5.11 MIL/uL   Hemoglobin 13.4  12.0 - 15.0 g/dL   HCT 96.2  95.2 - 84.1 %   MCV 91.4  78.0 - 100.0 fL   MCH 31.2  26.0 - 34.0 pg   MCHC 34.2  30.0 - 36.0 g/dL   RDW 32.4  40.1 - 02.7 %   Platelets 219  150 - 400 K/uL  SURGICAL PCR SCREEN      Component Value Range   MRSA, PCR NEGATIVE  NEGATIVE   Staphylococcus aureus NEGATIVE  NEGATIVE    Discharge Medications:     Medication List     As of 12/02/2012  7:58 AM    TAKE these  medications         amLODipine 5 MG tablet   Commonly known as: NORVASC   Take 5 mg by mouth every morning.      aspirin 81 MG tablet   Take 81 mg by mouth daily.      calcium-vitamin D 250-125 MG-UNIT per tablet   Commonly known as: OSCAL   Take 1 tablet by mouth 2 (two) times daily.      Co Q 10 100 MG Caps   Take 100 mg by mouth daily.      methocarbamol 500 MG tablet   Commonly known as: ROBAXIN   Take 1 tablet (500 mg total) by mouth every 6 (six) hours as needed.      multivitamin with minerals Tabs   Take 1 tablet by mouth daily.      naproxen sodium 220 MG tablet   Commonly known as: ANAPROX   Take 440 mg by mouth as needed.      omeprazole-sodium bicarbonate 40-1100 MG per capsule   Commonly known as: ZEGERID  Take 1 capsule by mouth daily before breakfast.      primidone 50 MG tablet   Commonly known as: MYSOLINE   Take 50 mg by mouth Nightly.      traMADol 50 MG tablet   Commonly known as: ULTRAM   Take 50 mg by mouth every 12 (twelve) hours as needed.      venlafaxine XR 150 MG 24 hr capsule   Commonly known as: EFFEXOR-XR   Take 150 mg by mouth every morning.      vitamin B-12 500 MCG tablet   Commonly known as: CYANOCOBALAMIN   Take 500 mcg by mouth daily.      Vitamin D-3 1000 UNITS Caps   Take by mouth daily.      zoledronic acid 5 MG/100ML Soln   Commonly known as: RECLAST   Inject 5 mg into the vein once. A year in the fall        Diagnostic Studies: Dg Chest 2 View  11/24/2012  *RADIOLOGY REPORT*  Clinical Data: Hypertension.  CHEST - 2 VIEW  Comparison: March 02, 2007.  Findings: Cardiomediastinal silhouette appears normal.  No acute pulmonary disease is noted. Postsurgical changes are seen involving the proximal right humerus.  IMPRESSION: No acute cardiopulmonary abnormality seen.   Original Report Authenticated By: Lupita Raider.,  M.D.    Dg Spine Portable 1 View  11/30/2012  *RADIOLOGY REPORT*  Clinical Data: Back surgery   PORTABLE SPINE - 1 VIEW  Comparison: 11/30/2012  Findings: Surgical instrument under the L4 lamina.  Second instrument under the L5 lamina directed towards S1 vertebral body.  Grade 1 anterior slip L4-5.  IMPRESSION: Lumbar localization as above.   Original Report Authenticated By: Janeece Riggers, M.D.    Dg Spine Portable 1 View  11/30/2012  *RADIOLOGY REPORT*  Clinical Data: Surgical level L4-5 and L5-S1  PORTABLE SPINE - 1 VIEW  Comparison: 11/30/2012  Findings: Clamp on the L4 spinous process.  Clamp on the L5 spinous process.  Instrument under the L5 lamina directed towards L5-S1.  IMPRESSION: Surgical localization as above.   Original Report Authenticated By: Janeece Riggers, M.D.    Dg Spine Portable 1 View  11/30/2012  *RADIOLOGY REPORT*  Clinical Data: Intraoperative localization for spine surgery.  PORTABLE SPINE - 1 VIEW  Comparison: Earlier films, same date.  Findings: There are surgical instruments marking the S1 and S2 levels.  IMPRESSION: S1 and S2 marked intraoperatively.   Original Report Authenticated By: Rudie Meyer, M.D.    Dg Spine Portable 1 View  11/30/2012  *RADIOLOGY REPORT*  Clinical Data: Surgical level L4-5.  L5-S1.  PORTABLE SPINE - 1 VIEW  Comparison: CT myelogram 07/11/2012  Findings: Two needles in the soft tissues directed at the L4 and L5 spinous processes.  Grade 1 anterior slip L4-5.  Disc degeneration at L4-5 and L5-S1. No fracture.  IMPRESSION: Needle directed towards the L4 and L5 spinous processes.   Original Report Authenticated By: Janeece Riggers, M.D.     Disposition: 01-Home or Self Care      Discharge Orders    Future Orders Please Complete By Expires   Diet - low sodium heart healthy      Call MD / Call 911      Comments:   If you experience chest pain or shortness of breath, CALL 911 and be transported to the hospital emergency room.  If you develope a fever above 101 F, pus (white drainage) or increased drainage or redness at the wound,  or calf pain,  call your surgeon's office.   Constipation Prevention      Comments:   Drink plenty of fluids.  Prune juice may be helpful.  You may use a stool softener, such as Colace (over the counter) 100 mg twice a day.  Use MiraLax (over the counter) for constipation as needed.   Increase activity slowly as tolerated            Signed: Velma Agnes B 12/02/2012, 7:58 AM

## 2012-12-02 NOTE — Progress Notes (Signed)
Orthopedics Progress Note  Subjective: Pt doing well pod #1 from lumbar surgery Pt would like to go home Pain is mild Denies any other symptoms  Objective:  Filed Vitals:   12/02/12 0444  BP: 129/75  Pulse: 88  Temp: 98.9 F (37.2 C)  Resp: 16    General: Awake and alert  Musculoskeletal: lumbar dressing intact, no drainage, nv intact distally Neurovascularly intact  Lab Results  Component Value Date   WBC 7.3 11/24/2012   HGB 13.4 11/24/2012   HCT 39.2 11/24/2012   MCV 91.4 11/24/2012   PLT 219 11/24/2012       Component Value Date/Time   NA 138 11/24/2012 1000   K 3.2* 11/24/2012 1000   CL 102 11/24/2012 1000   CO2 26 11/24/2012 1000   GLUCOSE 104* 11/24/2012 1000   BUN 14 11/24/2012 1000   CREATININE 0.54 11/24/2012 1000   CALCIUM 8.6 11/24/2012 1000   GFRNONAA >90 11/24/2012 1000   GFRAA >90 11/24/2012 1000    No results found for this basename: INR, PROTIME    Assessment/Plan: POD #1 s/p Procedure(s): LUMBAR LAMINECTOMY/DECOMPRESSION MICRODISCECTOMY 2 LEVELS  PT this morning then discharge home F/u in 2 weeks  Almedia Balls. Ranell Patrick, MD 12/02/2012 7:12 AM

## 2013-01-31 DIAGNOSIS — M25519 Pain in unspecified shoulder: Secondary | ICD-10-CM | POA: Diagnosis not present

## 2013-02-07 NOTE — Progress Notes (Signed)
12/02/12 1032  PT G-Codes **NOT FOR INPATIENT CLASS**  Mobility: Walking and Moving Around Goal Status [phone]) CI  Mobility: Walking and Moving Around Discharge Status (U0454) CI  PT General Charges  $$ ACUTE PT VISIT 1 Procedure  PT Treatments  $Gait Training 8-22 mins  $Self Care/Home Management 8-22

## 2013-02-09 DIAGNOSIS — H02839 Dermatochalasis of unspecified eye, unspecified eyelid: Secondary | ICD-10-CM | POA: Diagnosis not present

## 2013-02-09 DIAGNOSIS — H524 Presbyopia: Secondary | ICD-10-CM | POA: Diagnosis not present

## 2013-02-09 DIAGNOSIS — H11159 Pinguecula, unspecified eye: Secondary | ICD-10-CM | POA: Diagnosis not present

## 2013-02-09 DIAGNOSIS — H5200 Hypermetropia, unspecified eye: Secondary | ICD-10-CM | POA: Diagnosis not present

## 2013-02-09 DIAGNOSIS — H52209 Unspecified astigmatism, unspecified eye: Secondary | ICD-10-CM | POA: Diagnosis not present

## 2013-02-09 DIAGNOSIS — H11829 Conjunctivochalasis, unspecified eye: Secondary | ICD-10-CM | POA: Diagnosis not present

## 2013-02-09 DIAGNOSIS — H2589 Other age-related cataract: Secondary | ICD-10-CM | POA: Diagnosis not present

## 2013-02-09 DIAGNOSIS — H04209 Unspecified epiphora, unspecified lacrimal gland: Secondary | ICD-10-CM | POA: Diagnosis not present

## 2013-02-09 DIAGNOSIS — H526 Other disorders of refraction: Secondary | ICD-10-CM | POA: Diagnosis not present

## 2013-03-01 ENCOUNTER — Encounter (HOSPITAL_BASED_OUTPATIENT_CLINIC_OR_DEPARTMENT_OTHER): Payer: Self-pay | Admitting: *Deleted

## 2013-03-01 ENCOUNTER — Emergency Department (HOSPITAL_BASED_OUTPATIENT_CLINIC_OR_DEPARTMENT_OTHER)
Admission: EM | Admit: 2013-03-01 | Discharge: 2013-03-02 | Disposition: A | Discharge status: Home / Self Care | Payer: Medicare Other | Attending: Emergency Medicine | Admitting: Emergency Medicine

## 2013-03-01 ENCOUNTER — Emergency Department (HOSPITAL_BASED_OUTPATIENT_CLINIC_OR_DEPARTMENT_OTHER): Payer: Medicare Other

## 2013-03-01 DIAGNOSIS — K8050 Calculus of bile duct without cholangitis or cholecystitis without obstruction: Secondary | ICD-10-CM

## 2013-03-01 DIAGNOSIS — Z87891 Personal history of nicotine dependence: Secondary | ICD-10-CM | POA: Insufficient documentation

## 2013-03-01 DIAGNOSIS — K8020 Calculus of gallbladder without cholecystitis without obstruction: Secondary | ICD-10-CM | POA: Insufficient documentation

## 2013-03-01 DIAGNOSIS — I10 Essential (primary) hypertension: Secondary | ICD-10-CM | POA: Insufficient documentation

## 2013-03-01 DIAGNOSIS — K219 Gastro-esophageal reflux disease without esophagitis: Secondary | ICD-10-CM | POA: Diagnosis not present

## 2013-03-01 DIAGNOSIS — R1011 Right upper quadrant pain: Secondary | ICD-10-CM | POA: Diagnosis not present

## 2013-03-01 DIAGNOSIS — Z8739 Personal history of other diseases of the musculoskeletal system and connective tissue: Secondary | ICD-10-CM | POA: Insufficient documentation

## 2013-03-01 DIAGNOSIS — R079 Chest pain, unspecified: Secondary | ICD-10-CM | POA: Diagnosis not present

## 2013-03-01 DIAGNOSIS — R1013 Epigastric pain: Secondary | ICD-10-CM | POA: Insufficient documentation

## 2013-03-01 DIAGNOSIS — Z7982 Long term (current) use of aspirin: Secondary | ICD-10-CM | POA: Diagnosis not present

## 2013-03-01 DIAGNOSIS — Z79899 Other long term (current) drug therapy: Secondary | ICD-10-CM | POA: Insufficient documentation

## 2013-03-01 DIAGNOSIS — K828 Other specified diseases of gallbladder: Secondary | ICD-10-CM | POA: Diagnosis not present

## 2013-03-01 DIAGNOSIS — R0602 Shortness of breath: Secondary | ICD-10-CM | POA: Diagnosis not present

## 2013-03-01 DIAGNOSIS — R0789 Other chest pain: Secondary | ICD-10-CM | POA: Diagnosis not present

## 2013-03-01 DIAGNOSIS — N200 Calculus of kidney: Secondary | ICD-10-CM | POA: Diagnosis not present

## 2013-03-01 NOTE — ED Notes (Signed)
Pt sts that at apprx. 8:00pm tonight she developed a lower steranl aching pain that radiates to her back. Pt sts she thought it was an attack of indigestion but has had no relief with tums. Pt c/o some SOB.

## 2013-03-01 NOTE — ED Notes (Signed)
Pt took 81mg  ASA po today.

## 2013-03-02 ENCOUNTER — Ambulatory Visit (HOSPITAL_BASED_OUTPATIENT_CLINIC_OR_DEPARTMENT_OTHER)
Admit: 2013-03-02 | Discharge: 2013-03-02 | Disposition: A | Discharge status: Home / Self Care | Payer: Medicare Other | Attending: Emergency Medicine | Admitting: Emergency Medicine

## 2013-03-02 ENCOUNTER — Emergency Department (HOSPITAL_BASED_OUTPATIENT_CLINIC_OR_DEPARTMENT_OTHER): Payer: Medicare Other

## 2013-03-02 DIAGNOSIS — K8020 Calculus of gallbladder without cholecystitis without obstruction: Secondary | ICD-10-CM | POA: Diagnosis not present

## 2013-03-02 DIAGNOSIS — K828 Other specified diseases of gallbladder: Secondary | ICD-10-CM | POA: Diagnosis not present

## 2013-03-02 DIAGNOSIS — R079 Chest pain, unspecified: Secondary | ICD-10-CM | POA: Diagnosis not present

## 2013-03-02 DIAGNOSIS — N200 Calculus of kidney: Secondary | ICD-10-CM | POA: Diagnosis not present

## 2013-03-02 DIAGNOSIS — R1013 Epigastric pain: Secondary | ICD-10-CM

## 2013-03-02 DIAGNOSIS — R0602 Shortness of breath: Secondary | ICD-10-CM | POA: Diagnosis not present

## 2013-03-02 LAB — CBC WITH DIFFERENTIAL/PLATELET
Eosinophils Absolute: 0.2 10*3/uL (ref 0.0–0.7)
Hemoglobin: 13.1 g/dL (ref 12.0–15.0)
Lymphocytes Relative: 27 % (ref 12–46)
Lymphs Abs: 2.8 10*3/uL (ref 0.7–4.0)
Monocytes Relative: 13 % — ABNORMAL HIGH (ref 3–12)
Neutro Abs: 6 10*3/uL (ref 1.7–7.7)
Neutrophils Relative %: 57 % (ref 43–77)
Platelets: 236 10*3/uL (ref 150–400)
RBC: 4.24 MIL/uL (ref 3.87–5.11)
WBC: 10.4 10*3/uL (ref 4.0–10.5)

## 2013-03-02 LAB — COMPREHENSIVE METABOLIC PANEL
ALT: 163 U/L — ABNORMAL HIGH (ref 0–35)
Alkaline Phosphatase: 148 U/L — ABNORMAL HIGH (ref 39–117)
BUN: 19 mg/dL (ref 6–23)
CO2: 31 mEq/L (ref 19–32)
Chloride: 98 mEq/L (ref 96–112)
GFR calc Af Amer: >90 mL/min (ref >90)
Glucose, Bld: 97 mg/dL (ref 70–99)
Potassium: 3.6 mEq/L (ref 3.5–5.1)
Sodium: 140 mEq/L (ref 135–145)
Total Bilirubin: 0.6 mg/dL (ref 0.3–1.2)
Total Protein: 7.5 g/dL (ref 6.0–8.3)

## 2013-03-02 MED ORDER — ASPIRIN 81 MG PO CHEW
324.0000 mg | CHEWABLE_TABLET | Freq: Once | ORAL | Status: AC
  Administered 2013-03-02: 324 mg via ORAL
  Filled 2013-03-02: qty 4

## 2013-03-02 MED ORDER — IOHEXOL 300 MG/ML  SOLN
50.0000 mL | Freq: Once | INTRAMUSCULAR | Status: AC | PRN
  Administered 2013-03-02: 50 mL via ORAL

## 2013-03-02 MED ORDER — IOHEXOL 300 MG/ML  SOLN
100.0000 mL | Freq: Once | INTRAMUSCULAR | Status: AC | PRN
  Administered 2013-03-02: 100 mL via INTRAVENOUS

## 2013-03-02 MED ORDER — KETOROLAC TROMETHAMINE 15 MG/ML IJ SOLN
15.0000 mg | Freq: Once | INTRAMUSCULAR | Status: AC
  Administered 2013-03-02: 15 mg via INTRAVENOUS
  Filled 2013-03-02: qty 1

## 2013-03-02 NOTE — ED Provider Notes (Signed)
I received a report from [place] regarding this patient.  The results indicate that she has cholelithiasis with a dilated common bile duct but no definite obstructing stone.  I reviewed the chart and her lft's are elevated.  I called Dr. Elnoria Howard to discuss.  He recommends follow up with him in the office the beginning of the week.  I called the patient and instructed her to call Dr. Haywood Pao office to make these arrangements.  She is to return to the ED for fevers, increasing pain, or an inability to tolerate po.  She understands this and agrees with the plan.  Geoffery Lyons, MD 03/02/13 [phone]

## 2013-03-02 NOTE — ED Notes (Signed)
Patient transported to CT 

## 2013-03-02 NOTE — ED Provider Notes (Signed)
History     CSN: [id number]  Arrival date & time 03/01/13  2334   First MD Initiated Contact with Patient 03/02/13 0006      Chief Complaint  Patient presents with  . Chest Pain    (Consider location/radiation/quality/duration/timing/severity/associated sxs/prior treatment) HPI [name] is a 72 y.o. female past medical history significant for arthritis, back pain status post lumbar back surgery, and hypertension presents with epigastric pain that started about 8:00. The pain is underneath the xiphoid process, and is 5-6/10 at this time, has been as bad as 10 out of 10. It is described as tight, grabbing adult, it radiates to the back, is not associated with shortness of breath, nausea vomiting, diaphoresis.  Patient's pain began about a 15 to 8:00, she says it felt like bad indigestion however she does not typically get indigestion, this was about an hour and a half to 2 hours after eating non-spicy [place] food. Patient still has a gallbladder, no history of coronary artery disease, denies any history of venous thromboembolic disease, no recent immobilization, no recent cancer treatment, surgery was over 4 weeks ago, she denies hemoptysis.   Past Medical History  Diagnosis Date  . Osteoporosis   . Hypertension   . GERD (gastroesophageal reflux disease)   . 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 Abdominal hysterectomy  1983  . Bladder tack  [id number]  . Carpel tunnel  1992    right arm  . Rotator cuff repair      two on right, one on left  . Arterial vascular fistula  2005    [facility]  . Trigger finger release      right hand  . Lumbar laminectomy/decompression microdiscectomy  11/30/2012    Procedure: LUMBAR LAMINECTOMY/DECOMPRESSION MICRODISCECTOMY 2 LEVELS;  Surgeon: Drucilla Schmidt, MD;  Location: [facility];  Service: Orthopedics;  Laterality: N/A;  DECOMPRESSION LAMINECTOMY L4-L5, L5-S1 CENTRAL     No  family history on file.  History  Substance Use Topics  . Smoking status: Former Smoker -- 0.50 packs/day    Types: Cigarettes    Quit date: 07/11/1970  . Smokeless tobacco: Not on file  . Alcohol Use: Yes     Comment: wine     OB History   Grav Para Term Preterm Abortions TAB SAB Ect Mult Living                  Review of Systems At least 10pt or greater review of systems completed and are negative except where specified in the HPI.  Allergies  Codeine and Morphine and related  Home Medications   Current Outpatient Rx  Name  Route  Sig  Dispense  Refill  . amLODipine (NORVASC) 5 MG tablet   Oral   Take 5 mg by mouth every morning.          Marland Kitchen aspirin 81 MG tablet   Oral   Take 81 mg by mouth daily.         . calcium-vitamin D (OSCAL) 250-125 MG-UNIT per tablet   Oral   Take 1 tablet by mouth 2 (two) times daily.         . Cholecalciferol (VITAMIN D-3) 1000 UNITS CAPS   Oral   Take by mouth daily.         . Coenzyme Q10 (CO Q 10) 100 MG CAPS   Oral   Take 100  mg by mouth daily.         . methocarbamol (ROBAXIN) 500 MG tablet   Oral   Take 1 tablet (500 mg total) by mouth every 6 (six) hours as needed.   60 tablet   0   . Multiple Vitamin (MULTIVITAMIN WITH MINERALS) TABS   Oral   Take 1 tablet by mouth daily.         . naproxen sodium (ANAPROX) 220 MG tablet   Oral   Take 440 mg by mouth as needed.          Marland Kitchen omeprazole-sodium bicarbonate (ZEGERID) 40-1100 MG per capsule   Oral   Take 1 capsule by mouth daily before breakfast.         . primidone (MYSOLINE) 50 MG tablet   Oral   Take 50 mg by mouth Nightly.         . traMADol (ULTRAM) 50 MG tablet   Oral   Take 50 mg by mouth every 12 (twelve) hours as needed.         . venlafaxine XR (EFFEXOR-XR) 150 MG 24 hr capsule   Oral   Take 150 mg by mouth every morning.          . vitamin B-12 (CYANOCOBALAMIN) 500 MCG tablet   Oral   Take 500 mcg by mouth daily.          . zoledronic acid (RECLAST) 5 MG/100ML SOLN   Intravenous   Inject 5 mg into the vein once. A year in the fall           BP 158/74  Pulse 73  Temp(Src) 98 F (36.7 C) (Oral)  Resp 16  Ht 5\' 1"  (1.549 m)  Wt 200 lb (90.719 kg)  BMI 37.81 kg/m2  SpO2 97%  Physical Exam  Nursing notes reviewed.  Electronic medical record reviewed. VITAL SIGNS:   Filed Vitals:   03/01/13 2339 03/01/13 2353 03/02/13 0136 03/02/13 0351  BP: 169/76 158/74 150/64 145/66  Pulse: 79 73 71 71  Temp: 98 F (36.7 C)   97.9 F (36.6 C)  TempSrc: Oral   Oral  Resp: 18 16 17 18   Height: 5\' 1"  (1.549 m)     Weight: 200 lb (90.719 kg)     SpO2: 97% 97% 97% 97%   CONSTITUTIONAL: Awake, oriented x4, appears non-toxic HENT: Atraumatic, normocephalic, oral mucosa pink and moist, airway patent. Nares patent without drainage. External ears normal. EYES: Conjunctiva clear, EOMI, PERRLA NECK: Trachea midline, non-tender, supple CARDIOVASCULAR: Normal heart rate, Normal rhythm, No murmurs, rubs, gallops PULMONARY/CHEST: Clear to auscultation, no rhonchi, wheezes, or rales. Symmetrical breath sounds. Non-tender. ABDOMINAL: Non-distended, obese, soft, tender to palpation the right upper quadrant rebound or guarding.  BS normal. NEUROLOGIC: Non-focal, moving all four extremities, no gross sensory or motor deficits. EXTREMITIES: No clubbing, cyanosis, or edema SKIN: Warm, Dry, No erythema, No rash  ED Course  [place] bedside Date/Time: 03/02/2013 4:07 AM Performed by: Jones Skene Authorized by: Jones Skene Consent: Verbal consent obtained. Comments:  Bedside ultrasound of the right upper quadrant shows a distended gallbladder with stones and posterior shadowing near the neck of the gallbladder. Ductal system is not well visualized secondary to body habitus.   (including critical care time)  Date: 03/02/2013  Rate: 72  Rhythm: normal sinus rhythm  QRS Axis: normal  Intervals: normal  ST/T Wave  abnormalities: normal  Conduction Disutrbances: none  Narrative Interpretation: Low-voltage QRS; normal sinus rhythm, unremarkable compared with prior EKG  dated 11/24/2012.     Labs Reviewed  CBC WITH DIFFERENTIAL - Abnormal; Notable for the following:    Monocytes Relative 13 (*)    Monocytes Absolute 1.4 (*)    All other components within normal limits  COMPREHENSIVE METABOLIC PANEL - Abnormal; Notable for the following:    AST 273 (*)    ALT 163 (*)    Alkaline Phosphatase 148 (*)    GFR calc non Af Amer 88 (*)    All other components within normal limits  LIPASE, BLOOD - Abnormal; Notable for the following:    Lipase 73 (*)    All other components within normal limits  TROPONIN I  TROPONIN I   Dg Chest 2 View  03/02/2013  *RADIOLOGY REPORT*  Clinical Data: Chest pain and shortness of breath.  CHEST - 2 VIEW  Comparison: 11/24/2012  Findings: Shallow inspiration.  Normal heart size and pulmonary vascularity.  No focal airspace consolidation in the lungs.  No blunting of costophrenic angles.  No pneumothorax.  Tortuous aorta. Slight prominence of right paratracheal soft tissues, stable since previous study, probably representing prominent fatty tissue or vascular tissue.  Postoperative changes in the right shoulder. Mild degenerative changes in the spine.  No significant change since previous study.  IMPRESSION: No evidence of active pulmonary disease.   Original Report Authenticated By: Burman Nieves, M.D.    Ct Abdomen Pelvis W Contrast  03/02/2013  *RADIOLOGY REPORT*  Clinical Data: Right upper quadrant pain and nausea.  Gallstone on bed side ultrasound.  CT ABDOMEN AND PELVIS WITH CONTRAST  Technique:  Multidetector CT imaging of the abdomen and pelvis was performed following the standard protocol during bolus administration of intravenous contrast.  Contrast: 50mL OMNIPAQUE IOHEXOL 300 MG/ML  SOLN, OMNIPAQUE IOHEXOL 300 MG/ML  SOLN  Comparison: None.  Findings: Minimal  dependent changes in the lung bases.  Mild gallbladder distension without wall thickening or infiltration.  No gallstones are visualized but ultrasound is more sensitive for detection of gallstones.  The there is extrahepatic bile duct dilatation, measuring up to about 18 mm diameter, with minimal prominence of the intrahepatic bile ducts. No common duct stone is identified. There is mild prominence of the inferior accessory pancreatic duct. Main pancreatic duct is not enlarged. 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 No pancreatic mass or infiltration.  The liver, spleen, adrenal glands, inferior vena cava, and retroperitoneal lymph nodes are otherwise unremarkable.  Punctate stone in the lower pole of the left kidney without obstruction.  No hydronephrosis in either kidney.  Calcification of the abdominal aorta without aneurysm.  The stomach, small bowel, and colon are unremarkable.  The colon is stool-filled.  No free air or free fluid in the abdomen.  Moderately prominent visceral adipose tissues.  Small accessory spleen.  Pelvis:  The uterus appears to be surgically absent.  The bladder wall is not thickened.  Stool filled rectosigmoid colon without diverticulitis.  There are scattered diverticula present.  No free or loculated pelvic fluid collections.  The appendix is normal. The no significant pelvic lymphadenopathy.  Degenerative changes in the lumbar spine.  Postoperative changes at L4-5 with slight anterior subluxation of L4 on L5.  This appears stable since previous spine localization images from 11/30/2012.  IMPRESSION: Mild gallbladder distension without inflammatory change.  No gallstones are demonstrated.  Mild intrahepatic and moderate extrahepatic bile duct dilatation.  No stone or obstructing masses demonstrated.  Prominence of the pancreatic duct to the uncinate process without main duct dilatation suggest possible  pancreas divisum.   Original Report Authenticated By: Burman Nieves,  M.D.      1. Biliary colic   2. Epigastric pain       MDM  SYNTHIA FAIRBANK is a 72 y.o. female presenting with epigastric and right upper quadrant pain an hour or so after eating [place] food.  Patient was triaged according to chest pain protocol, but according to physical exam and history the pain is mostly in the epigastric region. It does radiate to her back. She does not drink and does not have a history of pancreatitis. Overall symptoms worrisome for cholecystitis, choledocholithiasis, versus pancreatitis or gallstone pancreatitis.  Labs obtained including LFTs and lipase. Bedside ultrasound does show stones near the gallbladder neck - unable to visualize the ductal system.  Labs show increased LFTs, increased alkaline phosphatase and a mildly increased lipase, all these liver function tests are elevated but are not very high, AST 273, ALT 163, alkaline phosphatase 148, and lipase 73.  This mild elevation may be consistent with biliary colic. There is no ultrasound available at this location, we'll obtain a CT of the abdomen and pelvis looking for gross signs of cholecystitis.  Patient has been treated with Toradol and she is currently pain-free. Results of CT as interpreted by radiologist shows mild gallbladder distention without inflammatory change-no gallstones seen on CT, no stone or obstructing masses are seen. He does make a mention of prominence of the pancreatic duct to the uncinate process without main ductal dilation suggesting possible pancreas divisum.  Patient continues to be pain-free.  Discussed treatment plan with the patient to have her obtain an ultrasound as an outpatient at [facility] and have her followup with [facility] for elective cholecystectomy. Patient is to return to the ER should any worsening symptoms occur including worsening pain, vomiting, bloody diarrhea, fevers or any other concerning symptoms.  Patient has sufficient pain medicine at home.  I do not think this epigastric pain is an anginal equivalent, troponin was negative x2, EKG was unremarkable and nonischemic.  All evidence at this point seems like it is biliary colic.  I explained the diagnosis and have given explicit precautions to return to the ER including any other new or worsening symptoms. The patient understands and accepts the medical plan as it's been dictated and I have answered their questions. Discharge instructions concerning home care and prescriptions have been given.  The patient is STABLE and is discharged to home in good condition.            Jones Skene, MD 03/02/13 [phone]

## 2013-03-02 NOTE — ED Notes (Signed)
MD at bedside giving test results and detailed dispo plan of care.

## 2013-03-02 NOTE — ED Notes (Signed)
Patient transported to X-ray 

## 2013-03-05 ENCOUNTER — Other Ambulatory Visit: Payer: Self-pay | Admitting: Gastroenterology

## 2013-03-05 DIAGNOSIS — R932 Abnormal findings on diagnostic imaging of liver and biliary tract: Secondary | ICD-10-CM | POA: Diagnosis not present

## 2013-03-05 DIAGNOSIS — R7402 Elevation of levels of lactic acid dehydrogenase (LDH): Secondary | ICD-10-CM | POA: Diagnosis not present

## 2013-03-05 DIAGNOSIS — K8020 Calculus of gallbladder without cholecystitis without obstruction: Secondary | ICD-10-CM | POA: Diagnosis not present

## 2013-03-07 ENCOUNTER — Encounter (HOSPITAL_COMMUNITY): Payer: Self-pay | Admitting: Pharmacy Technician

## 2013-03-09 ENCOUNTER — Encounter (HOSPITAL_COMMUNITY): Payer: Self-pay | Admitting: *Deleted

## 2013-03-09 ENCOUNTER — Encounter (HOSPITAL_COMMUNITY)
Admission: RE | Disposition: A | Discharge status: Home / Self Care | Payer: Self-pay | Source: Ambulatory Visit | Attending: Gastroenterology

## 2013-03-09 ENCOUNTER — Ambulatory Visit (HOSPITAL_COMMUNITY)
Admission: RE | Admit: 2013-03-09 | Discharge: 2013-03-09 | Disposition: A | Discharge status: Home / Self Care | Payer: Medicare Other | Source: Ambulatory Visit | Attending: Gastroenterology | Admitting: Gastroenterology

## 2013-03-09 DIAGNOSIS — I10 Essential (primary) hypertension: Secondary | ICD-10-CM | POA: Insufficient documentation

## 2013-03-09 DIAGNOSIS — R079 Chest pain, unspecified: Secondary | ICD-10-CM | POA: Insufficient documentation

## 2013-03-09 DIAGNOSIS — R109 Unspecified abdominal pain: Secondary | ICD-10-CM | POA: Diagnosis not present

## 2013-03-09 DIAGNOSIS — K8020 Calculus of gallbladder without cholecystitis without obstruction: Secondary | ICD-10-CM | POA: Insufficient documentation

## 2013-03-09 DIAGNOSIS — K838 Other specified diseases of biliary tract: Secondary | ICD-10-CM | POA: Insufficient documentation

## 2013-03-09 DIAGNOSIS — M810 Age-related osteoporosis without current pathological fracture: Secondary | ICD-10-CM | POA: Insufficient documentation

## 2013-03-09 DIAGNOSIS — R7402 Elevation of levels of lactic acid dehydrogenase (LDH): Secondary | ICD-10-CM | POA: Diagnosis not present

## 2013-03-09 DIAGNOSIS — R932 Abnormal findings on diagnostic imaging of liver and biliary tract: Secondary | ICD-10-CM | POA: Diagnosis not present

## 2013-03-09 DIAGNOSIS — K219 Gastro-esophageal reflux disease without esophagitis: Secondary | ICD-10-CM | POA: Diagnosis not present

## 2013-03-09 HISTORY — PX: EUS: SHX5427

## 2013-03-09 HISTORY — DX: Calculus of gallbladder without cholecystitis without obstruction: K80.20

## 2013-03-09 SURGERY — UPPER ENDOSCOPIC ULTRASOUND (EUS) LINEAR
Anesthesia: Moderate Sedation

## 2013-03-09 MED ORDER — CIPROFLOXACIN IN D5W 400 MG/200ML IV SOLN
400.0000 mg | Freq: Once | INTRAVENOUS | Status: AC
  Administered 2013-03-09: 400 mg via INTRAVENOUS

## 2013-03-09 MED ORDER — SODIUM CHLORIDE 0.9 % IV SOLN
INTRAVENOUS | Status: DC
  Administered 2013-03-09: 500 mL via INTRAVENOUS

## 2013-03-09 MED ORDER — FENTANYL CITRATE 0.05 MG/ML IJ SOLN
INTRAMUSCULAR | Status: DC | PRN
  Administered 2013-03-09 (×3): 25 ug via INTRAVENOUS

## 2013-03-09 MED ORDER — GLUCAGON HCL (RDNA) 1 MG IJ SOLR
INTRAMUSCULAR | Status: AC
  Filled 2013-03-09: qty 2

## 2013-03-09 MED ORDER — MIDAZOLAM HCL 10 MG/2ML IJ SOLN
INTRAMUSCULAR | Status: DC | PRN
  Administered 2013-03-09 (×3): 2 mg via INTRAVENOUS

## 2013-03-09 MED ORDER — BUTAMBEN-TETRACAINE-BENZOCAINE 2-2-14 % EX AERO
INHALATION_SPRAY | CUTANEOUS | Status: DC | PRN
  Administered 2013-03-09: 2 via TOPICAL

## 2013-03-09 MED ORDER — CIPROFLOXACIN IN D5W 400 MG/200ML IV SOLN
INTRAVENOUS | Status: AC
  Filled 2013-03-09: qty 200

## 2013-03-09 MED ORDER — MIDAZOLAM HCL 10 MG/2ML IJ SOLN
INTRAMUSCULAR | Status: AC
  Filled 2013-03-09: qty 4

## 2013-03-09 MED ORDER — DIPHENHYDRAMINE HCL 50 MG/ML IJ SOLN
INTRAMUSCULAR | Status: AC
  Filled 2013-03-09: qty 1

## 2013-03-09 MED ORDER — CIPROFLOXACIN IN D5W 400 MG/200ML IV SOLN: 400.0000 mg | Freq: Two times a day (BID) | INTRAVENOUS | Status: DC

## 2013-03-09 MED ORDER — FENTANYL CITRATE 0.05 MG/ML IJ SOLN
INTRAMUSCULAR | Status: AC
  Filled 2013-03-09: qty 4

## 2013-03-09 NOTE — Op Note (Signed)
[facility] [address] [place], [address]   ENDOSCOPIC ULTRASOUND PROCEDURE REPORT  PATIENT: [name], [name]  MR#: [id number] BIRTHDATE: 05/24/1941  GENDER: Female ENDOSCOPIST: Jeani Hawking, MD REFERRED BY: PROCEDURE DATE:  03/09/2013 PROCEDURE:   Upper EUS ASA CLASS:      Class III INDICATIONS:   1.  abnormal CT of the GI tract. MEDICATIONS: Versed 6 mg IV and Fentanyl 75 mcg IV  DESCRIPTION OF PROCEDURE:   After the risks benefits and alternatives of the procedure were  explained, informed consent was obtained. The patient was then placed in the left, lateral, decubitus postion and IV sedation was administered. Throughout the procedure, the patients blood pressure, pulse and oxygen saturations were monitored continuously.  Under direct visualization, the Pentax EUS Linear [id number]  endoscope was introduced through the mouth  and advanced to the second portion of the duodenum .  Water was used as necessary to provide an acoustic interface.  Upon completion of the imaging, water was removed and the patient was sent to the recovery room in satisfactory condition.      GALLBLADDER: The CBD was well visualized and it was able to be examined from the ampulla to the porta hepatis.  The CBD measured 5.9 mm, which is significantly smaller than the recent transabdominal ultrasound at 19 mm.  No evidence of any stones, sludge, masses, or strictures.  Small gallstones/sludge were found in the gallbladder, which was distended.   The scope was then withdrawn from the patient and the procedure completed.  COMPLICATIONS: There were no complications. ENDOSCOPIC VISUALIZATION:  ULTRASONIC VISUALIZATION:  STAGING:  ENDOSCOPIC IMPRESSION: 1) Small gallstones/sluidge. 2) Minimally dilated CBD.  RECOMMENDATIONS: 1) Referral to Dr.  Cicero Duck for lap chole. 2) Avoid fatty foods in the meantime.   _______________________________ eSignedJeani Hawking, MD  03/09/2013 10:59 AM   CC:

## 2013-03-09 NOTE — H&P (Signed)
Reason for Consult: ? Choledocholithiasis Referring Physician: Brayton El, M.D.  Jaynie Collins HPI: This is a 72 year old female with complaints of abdominal pain and chest pain.  [facility] the [facility] and she was noted to have abnormal liver enzymes as well as a CBD at 18 mm and cholelithiasis.  She was referred to GI for further evaluation and treatment.    Past Medical History  Diagnosis Date  . Osteoporosis   . Hypertension   . GERD (gastroesophageal reflux disease)   . 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 Gallstones     Past Surgical History  Procedure Laterality Date  . Abdominal hysterectomy  1983  . Bladder tack  [id number]  . Carpel tunnel  1992    right arm  . Rotator cuff repair      two on right, one on left  . Arterial vascular fistula  2005    [facility]  . Trigger finger release      right hand  . Lumbar laminectomy/decompression microdiscectomy  11/30/2012    Procedure: LUMBAR LAMINECTOMY/DECOMPRESSION MICRODISCECTOMY 2 LEVELS;  Surgeon: Drucilla Schmidt, MD;  Location: [facility];  Service: Orthopedics;  Laterality: N/A;  DECOMPRESSION LAMINECTOMY L4-L5, L5-S1 CENTRAL     History reviewed. No pertinent family history.  Social History:  reports that she quit smoking about 42 years ago. Her smoking use included Cigarettes. She smoked 0.50 packs per day. She does not have any smokeless tobacco history on file. She reports that  drinks alcohol. She reports that she does not use illicit drugs.  Allergies:  Allergies  Allergen Reactions  . Codeine Nausea And Vomiting  . Morphine And Related Nausea And Vomiting    Medications:  Scheduled: . ciprofloxacin  400 mg Intravenous Once   Continuous: . sodium chloride 500 mL (03/09/13 0902)    No results found for this or any previous visit (from the past 24 hour(s)).   No results found.  ROS:  As stated above in the HPI otherwise negative.  Blood pressure 150/85, pulse 67,  temperature 98.1 F (36.7 C), temperature source Oral, resp. rate 19, SpO2 93.00%.    PE: Gen: NAD, Alert and Oriented HEENT:  [place]/AT, EOMI Neck: Supple, no LAD Lungs: CTA Bilaterally CV: RRR without M/G/R ABM: Soft, NTND, +BS Ext: No C/C/E  Assessment/Plan: 1) ? Choledocholilthiasis. 2) Cholelithiasis.  Plan: 1) EUS +/- ERCP  Jerris Fleer D 03/09/2013, 9:27 AM

## 2013-03-12 ENCOUNTER — Encounter (INDEPENDENT_AMBULATORY_CARE_PROVIDER_SITE_OTHER): Payer: Self-pay

## 2013-03-12 ENCOUNTER — Encounter (HOSPITAL_COMMUNITY): Payer: Self-pay | Admitting: Gastroenterology

## 2013-03-21 ENCOUNTER — Encounter (INDEPENDENT_AMBULATORY_CARE_PROVIDER_SITE_OTHER): Payer: Self-pay | Admitting: Surgery

## 2013-03-21 ENCOUNTER — Ambulatory Visit (INDEPENDENT_AMBULATORY_CARE_PROVIDER_SITE_OTHER): Payer: Medicare Other | Admitting: Surgery

## 2013-03-21 VITALS — BP 124/88 | HR 84 | Temp 97.3°F | Resp 18 | Ht 59.5 in | Wt 201.8 lb

## 2013-03-21 DIAGNOSIS — K8020 Calculus of gallbladder without cholecystitis without obstruction: Secondary | ICD-10-CM | POA: Diagnosis not present

## 2013-03-21 NOTE — Patient Instructions (Addendum)
We will schedule surgery to remove your gallbladder 

## 2013-03-21 NOTE — Progress Notes (Signed)
Patient ID: [name], female   DOB: 11-18-1941, 72 y.o.   MRN: [id number]  Chief Complaint  Patient presents with  . Cholelithiasis    HPI [name] is a 72 y.o. female.  2 seen a few weeks ago in the emergency department with epigastric and right upper quadrant abdominal pain and evaluated. She was found to at a slight elevation in her liver functions, gallstones and enlarged common bile duct. She is a slightly evaluated by her gastroenterologist with endoscopies and an exam of her common duct with ultrasound. She showed normal size common duct and no debris or stones or tumor. She was then referred for consideration for cholecystectomy. She's had no further significant symptoms since the one episode. She does have some reflux and swallowing issues but they haven't changed any recently. She has been avoiding fatty foods per her recommendation by Dr.Hung HPI  Past Medical History  Diagnosis Date  . Osteoporosis   . Hypertension   . GERD (gastroesophageal reflux disease)   . 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 Gallstones     Past Surgical History  Procedure Laterality Date  . Abdominal hysterectomy  1983  . Bladder tack  [id number]  . Carpel tunnel  1992    right arm  . Rotator cuff repair      two on right, one on left  . Arterial vascular fistula  2005    [facility]  . Trigger finger release      right hand  . Lumbar laminectomy/decompression microdiscectomy  11/30/2012    Procedure: LUMBAR LAMINECTOMY/DECOMPRESSION MICRODISCECTOMY 2 LEVELS;  Surgeon: Drucilla Schmidt, MD;  Location: [facility];  Service: Orthopedics;  Laterality: N/A;  DECOMPRESSION LAMINECTOMY L4-L5, L5-S1 CENTRAL   . Eus N/A 03/09/2013    Procedure: UPPER ENDOSCOPIC ULTRASOUND (EUS) LINEAR;  Surgeon: Theda Belfast, MD;  Location: [facility];  Service: Endoscopy;  Laterality: N/A;    History reviewed. No pertinent family history.  Social History History  Substance Use Topics  .  Smoking status: Former Smoker -- 0.50 packs/day    Types: Cigarettes    Quit date: 07/11/1970  . Smokeless tobacco: Not on file  . Alcohol Use: Yes     Comment: wine     Allergies  Allergen Reactions  . Codeine Nausea And Vomiting  . Morphine And Related Nausea And Vomiting    Current Outpatient Prescriptions  Medication Sig Dispense Refill  . amLODipine (NORVASC) 5 MG tablet Take 5 mg by mouth every morning.       Marland Kitchen aspirin 81 MG tablet Take 81 mg by mouth daily.      . Calcium Carbonate (CALTRATE 600 PO) Take 1 tablet by mouth daily.      . Cholecalciferol (VITAMIN D-3) 1000 UNITS CAPS Take by mouth daily.      . Multiple Vitamin (MULTIVITAMIN WITH MINERALS) TABS Take 1 tablet by mouth daily.      . naproxen sodium (ANAPROX) 220 MG tablet Take 220 mg by mouth 2 (two) times daily with a meal.       . omeprazole-sodium bicarbonate (ZEGERID) 40-1100 MG per capsule Take 1 capsule by mouth daily before breakfast.      . primidone (MYSOLINE) 50 MG tablet Take 50 mg by mouth at bedtime.       Marland Kitchen venlafaxine XR (EFFEXOR-XR) 150 MG 24 hr capsule Take 150 mg by mouth every morning.       . vitamin B-12 (CYANOCOBALAMIN)  500 MCG tablet Take 500 mcg by mouth daily.      . zoledronic acid (RECLAST) 5 MG/100ML SOLN Inject 5 mg into the vein once. A year in the fall       No current facility-administered medications for this visit.    Review of Systems Review of Systems  Constitutional: Negative for fever, chills and unexpected weight change.  HENT: Positive for hearing loss and rhinorrhea. Negative for congestion, sore throat, trouble swallowing and voice change.   Eyes: Negative for visual disturbance.  Respiratory: Negative for cough and wheezing.   Cardiovascular: Negative for chest pain, palpitations and leg swelling.  Gastrointestinal: Negative for nausea, vomiting, abdominal pain, diarrhea, constipation, blood in stool, abdominal distention and anal bleeding.  Genitourinary: Negative  for hematuria, vaginal bleeding and difficulty urinating.  Musculoskeletal: Positive for myalgias. Negative for arthralgias.  Skin: Negative for rash and wound.  Neurological: Negative for seizures, syncope and headaches.  Hematological: Negative for adenopathy. Does not bruise/bleed easily.  Psychiatric/Behavioral: Negative for confusion.    Blood pressure 124/88, pulse 84, temperature 97.3 F (36.3 C), temperature source Temporal, resp. rate 18, height 4' 11.5" (1.511 m), weight 201 lb 12.8 oz (91.536 kg).  Physical Exam Physical Exam  Vitals reviewed. Constitutional: She is oriented to person, place, and time. She appears well-developed and well-nourished. No distress.  HENT:  Head: Normocephalic and atraumatic.  Mouth/Throat: Oropharynx is clear and moist.  Eyes: Conjunctivae and EOM are normal. Pupils are equal, round, and reactive to light. No scleral icterus.  Neck: Normal range of motion. Neck supple. No tracheal deviation present. No thyromegaly present.  Cardiovascular: Normal rate, regular rhythm, normal heart sounds and intact distal pulses.  Exam reveals no gallop and no friction rub.   No murmur heard. Pulmonary/Chest: Effort normal and breath sounds normal. No respiratory distress. She has no wheezes. She has no rales.  Abdominal: Soft. Bowel sounds are normal. She exhibits no distension and no mass. There is no hepatosplenomegaly, splenomegaly or hepatomegaly. There is no tenderness. There is no rebound, no guarding and no CVA tenderness. No hernia. Hernia confirmed negative in the ventral area.    Musculoskeletal: Normal range of motion. She exhibits no edema and no tenderness.  Neurological: She is alert and oriented to person, place, and time.  Skin: Skin is warm and dry. No rash noted. She is not diaphoretic. No erythema.  Psychiatric: She has a normal mood and affect. Her behavior is normal. Judgment and thought content normal.    Data Reviewed I reviewed the  notes electronic medical record including the scan from her gastroenterologists office.  Assessment    Chronic calculus cholecystitis. Given the abnormality on her common bile duct she may have passed a common duct stone.    Plan    I have recommended laparoscopic cholecystectomy. The patient is agreeable.  I have discussed the indications for laparoscopic cholecystectomy with her and provided educational material. We have discussed the risks of surgery, including general risks such as bleeding, infection, lung and heart issues etc. We have also discussed the potential for injuries to other organs, bile duct leaks, and other unexpected events. We have also talked about the fact that this may need to be converted to open under certain circumstances. We discussed the typical post op recovery and the fact that there is a good likelihood of improvement in symptoms and return to normal activity.  She understands this and wishes to proceed to schedule surgery. I believe all of her questions have  been answered.           Iridiana Fonner J 03/21/2013, 9:41 AM

## 2013-03-23 ENCOUNTER — Encounter (HOSPITAL_BASED_OUTPATIENT_CLINIC_OR_DEPARTMENT_OTHER): Payer: Self-pay | Admitting: *Deleted

## 2013-03-26 ENCOUNTER — Encounter (HOSPITAL_BASED_OUTPATIENT_CLINIC_OR_DEPARTMENT_OTHER): Payer: Self-pay | Admitting: *Deleted

## 2013-03-26 NOTE — Progress Notes (Signed)
03/26/13 1230  OBSTRUCTIVE SLEEP APNEA  Have you ever been diagnosed with sleep apnea through a sleep study? No  Do you snore loudly (loud enough to be heard through closed doors)?  1  Do you often feel tired, fatigued, or sleepy during the daytime? 0  Has anyone observed you stop breathing during your sleep? 0  Do you have, or are you being treated for high blood pressure? 1  BMI more than 35 kg/m2? 1  Age over 74 years old? 1  Gender: 0  Obstructive Sleep Apnea Score 4  Score 4 or greater  Results sent to PCP

## 2013-03-26 NOTE — Progress Notes (Signed)
No cardiac problems-pt interior designer-still works a lot-had labs and ekg in er 03/01/13

## 2013-03-29 ENCOUNTER — Ambulatory Visit (HOSPITAL_BASED_OUTPATIENT_CLINIC_OR_DEPARTMENT_OTHER): Payer: Medicare Other | Admitting: Certified Registered Nurse Anesthetist

## 2013-03-29 ENCOUNTER — Ambulatory Visit (HOSPITAL_BASED_OUTPATIENT_CLINIC_OR_DEPARTMENT_OTHER)
Admission: RE | Admit: 2013-03-29 | Discharge: 2013-03-29 | Disposition: A | Discharge status: Home / Self Care | Payer: Medicare Other | Source: Ambulatory Visit | Attending: Surgery | Admitting: Surgery

## 2013-03-29 ENCOUNTER — Encounter (HOSPITAL_BASED_OUTPATIENT_CLINIC_OR_DEPARTMENT_OTHER): Payer: Self-pay

## 2013-03-29 ENCOUNTER — Encounter (HOSPITAL_BASED_OUTPATIENT_CLINIC_OR_DEPARTMENT_OTHER)
Admission: RE | Disposition: A | Discharge status: Home / Self Care | Payer: Self-pay | Source: Ambulatory Visit | Attending: Surgery

## 2013-03-29 ENCOUNTER — Ambulatory Visit (HOSPITAL_COMMUNITY): Payer: Medicare Other

## 2013-03-29 ENCOUNTER — Encounter (HOSPITAL_BASED_OUTPATIENT_CLINIC_OR_DEPARTMENT_OTHER): Payer: Self-pay | Admitting: Certified Registered Nurse Anesthetist

## 2013-03-29 DIAGNOSIS — M129 Arthropathy, unspecified: Secondary | ICD-10-CM | POA: Insufficient documentation

## 2013-03-29 DIAGNOSIS — I10 Essential (primary) hypertension: Secondary | ICD-10-CM | POA: Diagnosis not present

## 2013-03-29 DIAGNOSIS — Z79899 Other long term (current) drug therapy: Secondary | ICD-10-CM | POA: Insufficient documentation

## 2013-03-29 DIAGNOSIS — Z7982 Long term (current) use of aspirin: Secondary | ICD-10-CM | POA: Diagnosis not present

## 2013-03-29 DIAGNOSIS — K219 Gastro-esophageal reflux disease without esophagitis: Secondary | ICD-10-CM | POA: Diagnosis not present

## 2013-03-29 DIAGNOSIS — Z885 Allergy status to narcotic agent status: Secondary | ICD-10-CM | POA: Insufficient documentation

## 2013-03-29 DIAGNOSIS — K8020 Calculus of gallbladder without cholecystitis without obstruction: Secondary | ICD-10-CM

## 2013-03-29 DIAGNOSIS — M810 Age-related osteoporosis without current pathological fracture: Secondary | ICD-10-CM | POA: Diagnosis not present

## 2013-03-29 DIAGNOSIS — Z87891 Personal history of nicotine dependence: Secondary | ICD-10-CM | POA: Insufficient documentation

## 2013-03-29 DIAGNOSIS — K8010 Calculus of gallbladder with chronic cholecystitis without obstruction: Secondary | ICD-10-CM | POA: Insufficient documentation

## 2013-03-29 DIAGNOSIS — K824 Cholesterolosis of gallbladder: Secondary | ICD-10-CM | POA: Diagnosis not present

## 2013-03-29 HISTORY — DX: Presence of spectacles and contact lenses: Z97.3

## 2013-03-29 HISTORY — PX: CHOLECYSTECTOMY: SHX55

## 2013-03-29 SURGERY — LAPAROSCOPIC CHOLECYSTECTOMY WITH INTRAOPERATIVE CHOLANGIOGRAM
Anesthesia: General | Site: Abdomen | Wound class: Contaminated

## 2013-03-29 MED ORDER — ONDANSETRON HCL 4 MG/2ML IJ SOLN: 4.0000 mg | Freq: Once | INTRAMUSCULAR | Status: DC | PRN

## 2013-03-29 MED ORDER — NEOSTIGMINE METHYLSULFATE 1 MG/ML IJ SOLN
INTRAMUSCULAR | Status: DC | PRN
  Administered 2013-03-29: 3.5 mg via INTRAVENOUS

## 2013-03-29 MED ORDER — OXYCODONE HCL 5 MG/5ML PO SOLN: 5.0000 mg | Freq: Once | ORAL | Status: AC | PRN

## 2013-03-29 MED ORDER — SODIUM CHLORIDE 0.9 % IR SOLN
Status: DC | PRN
  Administered 2013-03-29: 500 mL

## 2013-03-29 MED ORDER — OXYCODONE HCL 5 MG PO TABS
5.0000 mg | ORAL_TABLET | Freq: Once | ORAL | Status: AC | PRN
  Administered 2013-03-29: 5 mg via ORAL

## 2013-03-29 MED ORDER — HEMOSTATIC AGENTS (NO CHARGE) OPTIME
TOPICAL | Status: DC | PRN
  Administered 2013-03-29: 1 via TOPICAL

## 2013-03-29 MED ORDER — LACTATED RINGERS IV SOLN
INTRAVENOUS | Status: DC
  Administered 2013-03-29 (×2): via INTRAVENOUS

## 2013-03-29 MED ORDER — GLYCOPYRROLATE 0.2 MG/ML IJ SOLN
INTRAMUSCULAR | Status: DC | PRN
  Administered 2013-03-29: .5 mg via INTRAVENOUS

## 2013-03-29 MED ORDER — ROCURONIUM BROMIDE 100 MG/10ML IV SOLN
INTRAVENOUS | Status: DC | PRN
  Administered 2013-03-29: 35 mg via INTRAVENOUS
  Administered 2013-03-29: 5 mg via INTRAVENOUS

## 2013-03-29 MED ORDER — HEPARIN SODIUM (PORCINE) 5000 UNIT/ML IJ SOLN
5000.0000 [IU] | Freq: Once | INTRAMUSCULAR | Status: AC
  Administered 2013-03-29: 5000 [IU] via SUBCUTANEOUS

## 2013-03-29 MED ORDER — HYDROMORPHONE HCL 2 MG PO TABS: 2.0000 mg | ORAL_TABLET | ORAL | Status: DC | PRN

## 2013-03-29 MED ORDER — FENTANYL CITRATE 0.05 MG/ML IJ SOLN
INTRAMUSCULAR | Status: DC | PRN
  Administered 2013-03-29 (×2): 50 ug via INTRAVENOUS

## 2013-03-29 MED ORDER — LIDOCAINE HCL (CARDIAC) 20 MG/ML IV SOLN
INTRAVENOUS | Status: DC | PRN
  Administered 2013-03-29: 60 mg via INTRAVENOUS

## 2013-03-29 MED ORDER — CHLORHEXIDINE GLUCONATE 4 % EX LIQD: 1.0000 "application " | Freq: Once | CUTANEOUS | Status: DC

## 2013-03-29 MED ORDER — HYDROMORPHONE HCL PF 1 MG/ML IJ SOLN
0.2500 mg | INTRAMUSCULAR | Status: DC | PRN
  Administered 2013-03-29 (×2): 0.5 mg via INTRAVENOUS

## 2013-03-29 MED ORDER — SODIUM CHLORIDE 0.9 % IV SOLN
INTRAVENOUS | Status: DC | PRN
  Administered 2013-03-29: 12:00:00

## 2013-03-29 MED ORDER — CEFAZOLIN SODIUM-DEXTROSE 2-3 GM-% IV SOLR
2.0000 g | INTRAVENOUS | Status: AC
  Administered 2013-03-29: 2 g via INTRAVENOUS

## 2013-03-29 MED ORDER — BUPIVACAINE HCL (PF) 0.25 % IJ SOLN
INTRAMUSCULAR | Status: DC | PRN
  Administered 2013-03-29: 20 mL

## 2013-03-29 MED ORDER — PROPOFOL 10 MG/ML IV BOLUS
INTRAVENOUS | Status: DC | PRN
  Administered 2013-03-29: 120 mg via INTRAVENOUS
  Administered 2013-03-29: 50 mg via INTRAVENOUS

## 2013-03-29 MED ORDER — DEXAMETHASONE SODIUM PHOSPHATE 4 MG/ML IJ SOLN
INTRAMUSCULAR | Status: DC | PRN
  Administered 2013-03-29: 8 mg via INTRAVENOUS

## 2013-03-29 SURGICAL SUPPLY — 46 items
ADH SKN CLS APL DERMABOND .7 (GAUZE/BANDAGES/DRESSINGS) ×1
APPLIER CLIP ROT 10 11.4 M/L (STAPLE) ×2
APR CLP MED LRG 11.4X10 (STAPLE) ×1
BAG SPEC RTRVL LRG 6X4 10 (ENDOMECHANICALS) ×1
BLADE SURG ROTATE 9660 (MISCELLANEOUS) IMPLANT
CANISTER SUCTION 2500CC (MISCELLANEOUS) ×2 IMPLANT
CHLORAPREP W/TINT 26ML (MISCELLANEOUS) ×2 IMPLANT
CLIP APPLIE ROT 10 11.4 M/L (STAPLE) ×1 IMPLANT
CLOTH BEACON ORANGE TIMEOUT ST (SAFETY) ×2 IMPLANT
COVER MAYO STAND STRL (DRAPES) ×1 IMPLANT
DECANTER SPIKE VIAL GLASS SM (MISCELLANEOUS) ×2 IMPLANT
DERMABOND ADVANCED (GAUZE/BANDAGES/DRESSINGS) ×1
DERMABOND ADVANCED .7 DNX12 (GAUZE/BANDAGES/DRESSINGS) ×1 IMPLANT
DRAPE C-ARM 42X72 X-RAY (DRAPES) ×1 IMPLANT
DRAPE UTILITY XL STRL (DRAPES) ×1 IMPLANT
ELECT REM PT RETURN 9FT ADLT (ELECTROSURGICAL) ×2
ELECTRODE REM PT RTRN 9FT ADLT (ELECTROSURGICAL) ×1 IMPLANT
FILTER SMOKE EVAC LAPAROSHD (FILTER) ×2 IMPLANT
GLOVE BIO SURGEON STRL SZ 6.5 (GLOVE) ×2 IMPLANT
GLOVE BIO SURGEON STRL SZ7 (GLOVE) ×1 IMPLANT
GLOVE BIOGEL PI IND STRL 7.0 (GLOVE) IMPLANT
GLOVE BIOGEL PI INDICATOR 7.0 (GLOVE) ×3
GLOVE ECLIPSE 6.5 STRL STRAW (GLOVE) ×1 IMPLANT
GLOVE EUDERMIC 7 POWDERFREE (GLOVE) ×3 IMPLANT
GLOVE EXAM NITRILE MD LF STRL (GLOVE) ×1 IMPLANT
GOWN PREVENTION PLUS XLARGE (GOWN DISPOSABLE) ×5 IMPLANT
GOWN PREVENTION PLUS XXLARGE (GOWN DISPOSABLE) ×1 IMPLANT
HEMOSTAT SNOW SURGICEL 2X4 (HEMOSTASIS) ×1 IMPLANT
NS IRRIG 1000ML POUR BTL (IV SOLUTION) ×2 IMPLANT
PACK BASIN DAY SURGERY FS (CUSTOM PROCEDURE TRAY) ×2 IMPLANT
POUCH SPECIMEN RETRIEVAL 10MM (ENDOMECHANICALS) ×2 IMPLANT
SCISSORS LAP 5X35 DISP (ENDOMECHANICALS) ×1 IMPLANT
SET CHOLANGIOGRAPH 5 50 .035 (SET/KITS/TRAYS/PACK) ×1 IMPLANT
SET IRRIG TUBING LAPAROSCOPIC (IRRIGATION / IRRIGATOR) ×2 IMPLANT
SLEEVE ENDOPATH XCEL 5M (ENDOMECHANICALS) ×1 IMPLANT
SLEEVE SCD COMPRESS KNEE MED (MISCELLANEOUS) ×2 IMPLANT
SPECIMEN JAR SMALL (MISCELLANEOUS) ×1 IMPLANT
SUT MNCRL AB 4-0 PS2 18 (SUTURE) ×2 IMPLANT
TOWEL OR 17X24 6PK STRL BLUE (TOWEL DISPOSABLE) ×2 IMPLANT
TRAY LAPAROSCOPIC (CUSTOM PROCEDURE TRAY) ×2 IMPLANT
TROCAR XCEL BLUNT TIP 100MML (ENDOMECHANICALS) ×2 IMPLANT
TROCAR XCEL NON-BLD 11X100MML (ENDOMECHANICALS) ×1 IMPLANT
TROCAR XCEL NON-BLD 5MMX100MML (ENDOMECHANICALS) ×1 IMPLANT
TROCAR Z-THREAD FIOS 11X100 BL (TROCAR) ×1 IMPLANT
TROCAR Z-THREAD FIOS 5X100MM (TROCAR) ×1 IMPLANT
TUBE CONNECTING 20X1/4 (TUBING) ×2 IMPLANT

## 2013-03-29 NOTE — Anesthesia Preprocedure Evaluation (Addendum)
Anesthesia Evaluation  Patient identified by MRN, date of birth, ID band Patient awake    Reviewed: Allergy & Precautions, H&P , NPO status , Patient's Chart, lab work & pertinent test results  Airway Mallampati: I TM Distance: >3 FB Neck ROM: Full    Dental  (+) Teeth Intact and Dental Advisory Given   Pulmonary  breath sounds clear to auscultation        Cardiovascular hypertension, Pt. on medications Rhythm:Regular Rate:Normal     Neuro/Psych    GI/Hepatic GERD-  Medicated,  Endo/Other    Renal/GU      Musculoskeletal   Abdominal   Peds  Hematology   Anesthesia Other Findings   Reproductive/Obstetrics                           Anesthesia Physical Anesthesia Plan  ASA: II  Anesthesia Plan: General   Post-op Pain Management:    Induction: Intravenous  Airway Management Planned: Oral ETT  Additional Equipment:   Intra-op Plan:   Post-operative Plan: Extubation in OR  Informed Consent: I have reviewed the patients History and Physical, chart, labs and discussed the procedure including the risks, benefits and alternatives for the proposed anesthesia with the patient or authorized representative who has indicated his/her understanding and acceptance.   Dental advisory given  Plan Discussed with: CRNA, Anesthesiologist and Surgeon  Anesthesia Plan Comments:         Anesthesia Quick Evaluation  

## 2013-03-29 NOTE — Progress Notes (Signed)
Pt transferred to Phase II PACU at this time; Report given To Peggy RN to assume nursing care for pt.

## 2013-03-29 NOTE — Interval H&P Note (Signed)
History and Physical Interval Note:  03/29/2013 10:33 AM  [name]  has presented today for surgery, with the diagnosis of gallstones  The various methods of treatment have been discussed with the patient and family. After consideration of risks, benefits and other options for treatment, the patient has consented to  Procedure(s): LAPAROSCOPIC CHOLECYSTECTOMY WITH INTRAOPERATIVE CHOLANGIOGRAM (N/A) as a surgical intervention .  The patient's history has been reviewed, patient examined, no change in status, stable for surgery.  I have reviewed the patient's chart and labs.  Questions were answered to the patient's satisfaction.     Jonahtan Manseau J

## 2013-03-29 NOTE — Anesthesia Procedure Notes (Signed)
Procedure Name: Intubation Date/Time: 03/29/2013 10:42 AM Performed by: Ezekeil Bethel D Pre-anesthesia Checklist: Patient identified, Emergency Drugs available, Suction available and Patient being monitored Patient Re-evaluated:Patient Re-evaluated prior to inductionOxygen Delivery Method: Circle System Utilized Preoxygenation: Pre-oxygenation with 100% oxygen Intubation Type: IV induction Ventilation: Mask ventilation without difficulty Laryngoscope Size: Mac and 3 Grade View: Grade II Tube type: Oral Tube size: 7.0 mm Number of attempts: 1 Airway Equipment and Method: stylet and LTA kit utilized Placement Confirmation: ETT inserted through vocal cords under direct vision,  positive ETCO2 and breath sounds checked- equal and bilateral Secured at: 21 cm Tube secured with: Tape Dental Injury: Teeth and Oropharynx as per pre-operative assessment

## 2013-03-29 NOTE — Transfer of Care (Signed)
Immediate Anesthesia Transfer of Care Note  Patient: [name]  Procedure(s) Performed: Procedure(s): LAPAROSCOPIC CHOLECYSTECTOMY WITH INTRAOPERATIVE CHOLANGIOGRAM (N/A)  Patient Location: PACU  Anesthesia Type:General  Level of Consciousness: awake and patient cooperative  Airway & Oxygen Therapy: Patient Spontanous Breathing and Patient connected to face mask oxygen  Post-op Assessment: Report given to PACU RN and Post -op Vital signs reviewed and stable  Post vital signs: Reviewed and stable  Complications: No apparent anesthesia complications

## 2013-03-29 NOTE — H&P (View-Only) (Signed)
Patient ID: [name], female   DOB: 12/22/1940, 72 y.o.   MRN: [id number]  Chief Complaint  Patient presents with  . Cholelithiasis    HPI [name] is a 72 y.o. female.  2 seen a few weeks ago in the emergency department with epigastric and right upper quadrant abdominal pain and evaluated. She was found to at a slight elevation in her liver functions, gallstones and enlarged common bile duct. She is a slightly evaluated by her gastroenterologist with endoscopies and an exam of her common duct with ultrasound. She showed normal size common duct and no debris or stones or tumor. She was then referred for consideration for cholecystectomy. She's had no further significant symptoms since the one episode. She does have some reflux and swallowing issues but they haven't changed any recently. She has been avoiding fatty foods per her recommendation by Dr.Hung HPI  Past Medical History  Diagnosis Date  . Osteoporosis   . Hypertension   . GERD (gastroesophageal reflux disease)   . 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 Gallstones     Past Surgical History  Procedure Laterality Date  . Abdominal hysterectomy  1983  . Bladder tack  1994ish  . Carpel tunnel  1992    right arm  . Rotator cuff repair      two on right, one on left  . Arterial vascular fistula  2005    [facility]  . Trigger finger release      right hand  . Lumbar laminectomy/decompression microdiscectomy  11/30/2012    Procedure: LUMBAR LAMINECTOMY/DECOMPRESSION MICRODISCECTOMY 2 LEVELS;  Surgeon: James P Aplington, MD;  Location: [facility];  Service: Orthopedics;  Laterality: N/A;  DECOMPRESSION LAMINECTOMY L4-L5, L5-S1 CENTRAL   . Eus N/A 03/09/2013    Procedure: UPPER ENDOSCOPIC ULTRASOUND (EUS) LINEAR;  Surgeon: Patrick D Hung, MD;  Location: [facility];  Service: Endoscopy;  Laterality: N/A;    History reviewed. No pertinent family history.  Social History History  Substance Use Topics  .  Smoking status: Former Smoker -- 0.50 packs/day    Types: Cigarettes    Quit date: 07/11/1970  . Smokeless tobacco: Not on file  . Alcohol Use: Yes     Comment: wine     Allergies  Allergen Reactions  . Codeine Nausea And Vomiting  . Morphine And Related Nausea And Vomiting    Current Outpatient Prescriptions  Medication Sig Dispense Refill  . amLODipine (NORVASC) 5 MG tablet Take 5 mg by mouth every morning.       . aspirin 81 MG tablet Take 81 mg by mouth daily.      . Calcium Carbonate (CALTRATE 600 PO) Take 1 tablet by mouth daily.      . Cholecalciferol (VITAMIN D-3) 1000 UNITS CAPS Take by mouth daily.      . Multiple Vitamin (MULTIVITAMIN WITH MINERALS) TABS Take 1 tablet by mouth daily.      . naproxen sodium (ANAPROX) 220 MG tablet Take 220 mg by mouth 2 (two) times daily with a meal.       . omeprazole-sodium bicarbonate (ZEGERID) 40-1100 MG per capsule Take 1 capsule by mouth daily before breakfast.      . primidone (MYSOLINE) 50 MG tablet Take 50 mg by mouth at bedtime.       . venlafaxine XR (EFFEXOR-XR) 150 MG 24 hr capsule Take 150 mg by mouth every morning.       . vitamin B-12 (CYANOCOBALAMIN)   500 MCG tablet Take 500 mcg by mouth daily.      . zoledronic acid (RECLAST) 5 MG/100ML SOLN Inject 5 mg into the vein once. A year in the fall       No current facility-administered medications for this visit.    Review of Systems Review of Systems  Constitutional: Negative for fever, chills and unexpected weight change.  HENT: Positive for hearing loss and rhinorrhea. Negative for congestion, sore throat, trouble swallowing and voice change.   Eyes: Negative for visual disturbance.  Respiratory: Negative for cough and wheezing.   Cardiovascular: Negative for chest pain, palpitations and leg swelling.  Gastrointestinal: Negative for nausea, vomiting, abdominal pain, diarrhea, constipation, blood in stool, abdominal distention and anal bleeding.  Genitourinary: Negative  for hematuria, vaginal bleeding and difficulty urinating.  Musculoskeletal: Positive for myalgias. Negative for arthralgias.  Skin: Negative for rash and wound.  Neurological: Negative for seizures, syncope and headaches.  Hematological: Negative for adenopathy. Does not bruise/bleed easily.  Psychiatric/Behavioral: Negative for confusion.    Blood pressure 124/88, pulse 84, temperature 97.3 F (36.3 C), temperature source Temporal, resp. rate 18, height 4' 11.5" (1.511 m), weight 201 lb 12.8 oz (91.536 kg).  Physical Exam Physical Exam  Vitals reviewed. Constitutional: She is oriented to person, place, and time. She appears well-developed and well-nourished. No distress.  HENT:  Head: Normocephalic and atraumatic.  Mouth/Throat: Oropharynx is clear and moist.  Eyes: Conjunctivae and EOM are normal. Pupils are equal, round, and reactive to light. No scleral icterus.  Neck: Normal range of motion. Neck supple. No tracheal deviation present. No thyromegaly present.  Cardiovascular: Normal rate, regular rhythm, normal heart sounds and intact distal pulses.  Exam reveals no gallop and no friction rub.   No murmur heard. Pulmonary/Chest: Effort normal and breath sounds normal. No respiratory distress. She has no wheezes. She has no rales.  Abdominal: Soft. Bowel sounds are normal. She exhibits no distension and no mass. There is no hepatosplenomegaly, splenomegaly or hepatomegaly. There is no tenderness. There is no rebound, no guarding and no CVA tenderness. No hernia. Hernia confirmed negative in the ventral area.    Musculoskeletal: Normal range of motion. She exhibits no edema and no tenderness.  Neurological: She is alert and oriented to person, place, and time.  Skin: Skin is warm and dry. No rash noted. She is not diaphoretic. No erythema.  Psychiatric: She has a normal mood and affect. Her behavior is normal. Judgment and thought content normal.    Data Reviewed I reviewed the  notes electronic medical record including the scan from her gastroenterologists office.  Assessment    Chronic calculus cholecystitis. Given the abnormality on her common bile duct she may have passed a common duct stone.    Plan    I have recommended laparoscopic cholecystectomy. The patient is agreeable.  I have discussed the indications for laparoscopic cholecystectomy with her and provided educational material. We have discussed the risks of surgery, including general risks such as bleeding, infection, lung and heart issues etc. We have also discussed the potential for injuries to other organs, bile duct leaks, and other unexpected events. We have also talked about the fact that this may need to be converted to open under certain circumstances. We discussed the typical post op recovery and the fact that there is a good likelihood of improvement in symptoms and return to normal activity.  She understands this and wishes to proceed to schedule surgery. I believe all of her questions have   been answered.           Caide Campi J 03/21/2013, 9:41 AM    

## 2013-03-29 NOTE — Op Note (Signed)
DAI APEL Dec 16, 1940 [id number] 03/21/2013  Preoperative diagnosis: Chronic calculous cholecystitis  Postoperative diagnosis: Same  Procedure: laparoscopic cholecystectomy with intraoperative cholangiogram  Surgeon: Currie Paris, MD, FACS  Assistant surgeon: Dr. Romie Levee   Anesthesia: General  Clinical History and Indications: This patient has known gallstones and comes in today for cholecystectomy.  Description of procedure: The patient was seen in the preoperative area. I reviewed the plans for the procedure with her as well as the risks and complications. She had no further questions and wished to proceed.  The patient was taken to the operating room. After satisfactory general endotracheal anesthesia had been obtained the abdomen was prepped and draped. A time out was done.  0.25% plain Marcaine was used at all incisions. I made an umbilical incision, identified the fascia and opened that, and entered the peritoneal cavity under direct vision. A 0 Vicryl pursestring suture was placed and the Hasson cannula was introduced under direct vision and secured with the pursestring. The abdomen was inflated to 15 cm.  The camera was placed and there were no gross abnormalities. The patient was then placed in reverse Trendelenburg and tilted to the left. A 10/11 trocar was placed in the epigastrium and two 5 mm trochars placed laterally all under direct vision.  The gallbladder was noted to be distended but not acutely inflamed. There are minimal adhesions around it. The hepatic artery was really noticeable anterior to the common duct. We began by retracting the gallbladder over the liver and opening the peritoneum and developing along window of cystic duct and cystic artery. Cystic artery was somewhat anterior to the duct so I clipped it and divided d once I had the window made and the anatomy clarified.  An intraoperative cholangiogram was then performed. A Cook catheter was  introduced percutaneously and placed in the cystic duct. The cholangiogram showed good filling of the common duct and hepatic radicals and free flow into the duodenum. No abnormalities were noted.  The catheter was removed and 3 clips placed on the stay side of the cystic duct. The duct was then divided.   The gallbladder was then removed from below to above the coagulation current of the cautery. It was then placed in a bag to be retrieved later.  The abdomen was irrigated and a check for hemostasis along the bed of the gallbladder made.I did leave some SNOW in the bed as there was a fairly large raw surface but there was no active bleeding. Once everything appeared to be dry we were able to move the camera to the epigastric port and removed the gallbladder through the umbilical port.  The abdomen was reinsufflated and a final check for hemostasis made. There is no evidence of bleeding or bile leakage. The lateral ports were removed under direct vision and there was no bleeding. The umbilical site was closed with a pursestring, watching with the camera in the epigastric port. The abdomen was then deflated through the epigastric port and that was removed. Skin was closed with 4-0 Monocryl subcuticular and Dermabond.  The patient tolerated the procedure well. There were no operative complications. EBL was minimal. All counts were correct.  Currie Paris, MD, FACS 03/29/2013 11:41 AM

## 2013-03-29 NOTE — Anesthesia Postprocedure Evaluation (Signed)
  Anesthesia Post-op Note  Patient: [name]  Procedure(s) Performed: Procedure(s): LAPAROSCOPIC CHOLECYSTECTOMY WITH INTRAOPERATIVE CHOLANGIOGRAM (N/A)  Patient Location: PACU  Anesthesia Type:General  Level of Consciousness: awake, alert  and oriented  Airway and Oxygen Therapy: Patient Spontanous Breathing and Patient connected to face mask oxygen  Post-op Pain: mild  Post-op Assessment: Post-op Vital signs reviewed  Post-op Vital Signs: Reviewed  Complications: No apparent anesthesia complications

## 2013-04-02 ENCOUNTER — Encounter (HOSPITAL_BASED_OUTPATIENT_CLINIC_OR_DEPARTMENT_OTHER): Payer: Self-pay | Admitting: Surgery

## 2013-04-04 DIAGNOSIS — R7301 Impaired fasting glucose: Secondary | ICD-10-CM | POA: Diagnosis not present

## 2013-04-04 DIAGNOSIS — M949 Disorder of cartilage, unspecified: Secondary | ICD-10-CM | POA: Diagnosis not present

## 2013-04-04 DIAGNOSIS — M899 Disorder of bone, unspecified: Secondary | ICD-10-CM | POA: Diagnosis not present

## 2013-04-04 DIAGNOSIS — I10 Essential (primary) hypertension: Secondary | ICD-10-CM | POA: Diagnosis not present

## 2013-04-13 ENCOUNTER — Encounter (INDEPENDENT_AMBULATORY_CARE_PROVIDER_SITE_OTHER): Payer: Self-pay | Admitting: Surgery

## 2013-04-13 ENCOUNTER — Ambulatory Visit (INDEPENDENT_AMBULATORY_CARE_PROVIDER_SITE_OTHER): Payer: Medicare Other | Admitting: Surgery

## 2013-04-13 VITALS — BP 142/86 | HR 72 | Temp 98.2°F | Resp 16 | Ht 59.0 in | Wt 202.0 lb

## 2013-04-13 DIAGNOSIS — K8020 Calculus of gallbladder without cholecystitis without obstruction: Secondary | ICD-10-CM

## 2013-04-13 NOTE — Progress Notes (Signed)
NAME: [name]       DOB: 01/28/1941           DATE: 04/13/2013       ZOX:[id number]   CC:  Chief Complaint  Patient presents with  . Follow-up    1st po lap chole     Impression:  The patient appears to be doing well, with improvement in her symptoms.  Plan:  She may resume full activity and regular diet. She  will followup with [place] on a p.r.n. basis. I did tell her that she may still have some foods that cause indigestion and ask her to call us if there are any questions, problems or concerns.  HPI:  This patient underwent a laparoscopic cholecystectomy with operative cholangiogram on 4/17. She is in for her first postoperative visit. She notes that her incisional pain has resolved. Her preoperative symptoms have improved. She is not having problems with nausea, vomiting, diarrhea, fevers, chills, or urinary symptoms.She has had some mild increased heartburn She is tolerating diet. She feels that she is progressing well and nearly back to normal. PE:  VS: BP 142/86  Pulse 72  Temp(Src) 98.2 F (36.8 C) (Oral)  Resp 16  Ht 4\' 11"  (1.499 m)  Wt 202 lb (91.627 kg)  BMI 40.78 kg/m2  General: The patient is alert and appears comfortable, NAD.  Abdomen: Soft and benign. The incisions are healing nicely. There are no apparent problems.  Data reviewed: IOC:  Findings: Only a single C-arm spot film was returned from the  operating room during the intraoperative cholangiogram. Contrast  was injected via the cystic duct. There is good visualization of  the slightly prominent common bile duct. However, there does  appear to be free passage of contrast into the duodenal loop and no  filling defect or obstruction is seen.  IMPRESSION:  Negative intraoperative cholangiogram on single view obtained.  Original Report Authenticated By: Dwyane Dee, M.D.   Pathology:  Diagnosis Gallbladder - FOCALLY ERODED GALLBLADDER MUCOSA WITH ASSOCIATED CHRONIC CHOLECYSTITIS AND FOCAL.  CHOLESTEROLOSIS. - CHOLELITHIASIS. - ONE BENIGN LYMPH NODE (0/1). - NO TUMOR SEEN. Zandra Abts MD Pathologist, Electronic Signature (Case signed 03/30/2013)

## 2013-04-13 NOTE — Patient Instructions (Signed)
We will see you again on an as needed basis. Please call the office at [phone] if you have any questions or concerns. Thank you for allowing us to take care of you.  

## 2013-04-16 DIAGNOSIS — Z1331 Encounter for screening for depression: Secondary | ICD-10-CM | POA: Diagnosis not present

## 2013-04-16 DIAGNOSIS — G3184 Mild cognitive impairment, so stated: Secondary | ICD-10-CM | POA: Diagnosis not present

## 2013-04-16 DIAGNOSIS — Z0000 Encounter for general adult medical examination without abnormal findings: Secondary | ICD-10-CM | POA: Diagnosis not present

## 2013-04-16 DIAGNOSIS — M949 Disorder of cartilage, unspecified: Secondary | ICD-10-CM | POA: Diagnosis not present

## 2013-04-16 DIAGNOSIS — M899 Disorder of bone, unspecified: Secondary | ICD-10-CM | POA: Diagnosis not present

## 2013-04-16 DIAGNOSIS — K219 Gastro-esophageal reflux disease without esophagitis: Secondary | ICD-10-CM | POA: Diagnosis not present

## 2013-04-16 DIAGNOSIS — R7301 Impaired fasting glucose: Secondary | ICD-10-CM | POA: Diagnosis not present

## 2013-04-16 DIAGNOSIS — N318 Other neuromuscular dysfunction of bladder: Secondary | ICD-10-CM | POA: Diagnosis not present

## 2013-04-16 DIAGNOSIS — I10 Essential (primary) hypertension: Secondary | ICD-10-CM | POA: Diagnosis not present

## 2013-04-16 DIAGNOSIS — G250 Essential tremor: Secondary | ICD-10-CM | POA: Diagnosis not present

## 2013-04-17 ENCOUNTER — Other Ambulatory Visit: Payer: Self-pay | Admitting: Dermatology

## 2013-04-17 DIAGNOSIS — L821 Other seborrheic keratosis: Secondary | ICD-10-CM | POA: Diagnosis not present

## 2013-04-17 DIAGNOSIS — L820 Inflamed seborrheic keratosis: Secondary | ICD-10-CM | POA: Diagnosis not present

## 2013-04-17 DIAGNOSIS — D485 Neoplasm of uncertain behavior of skin: Secondary | ICD-10-CM | POA: Diagnosis not present

## 2013-04-17 DIAGNOSIS — L723 Sebaceous cyst: Secondary | ICD-10-CM | POA: Diagnosis not present

## 2013-04-17 DIAGNOSIS — D1801 Hemangioma of skin and subcutaneous tissue: Secondary | ICD-10-CM | POA: Diagnosis not present

## 2013-04-17 DIAGNOSIS — L819 Disorder of pigmentation, unspecified: Secondary | ICD-10-CM | POA: Diagnosis not present

## 2013-05-04 DIAGNOSIS — M6530 Trigger finger, unspecified finger: Secondary | ICD-10-CM | POA: Diagnosis not present

## 2013-05-22 DIAGNOSIS — M899 Disorder of bone, unspecified: Secondary | ICD-10-CM | POA: Diagnosis not present

## 2013-05-29 DIAGNOSIS — R490 Dysphonia: Secondary | ICD-10-CM | POA: Diagnosis not present

## 2013-05-29 DIAGNOSIS — K219 Gastro-esophageal reflux disease without esophagitis: Secondary | ICD-10-CM | POA: Diagnosis not present

## 2013-05-29 DIAGNOSIS — J040 Acute laryngitis: Secondary | ICD-10-CM | POA: Diagnosis not present

## 2013-06-07 DIAGNOSIS — K219 Gastro-esophageal reflux disease without esophagitis: Secondary | ICD-10-CM | POA: Diagnosis not present

## 2013-06-07 DIAGNOSIS — R490 Dysphonia: Secondary | ICD-10-CM | POA: Diagnosis not present

## 2013-08-16 ENCOUNTER — Other Ambulatory Visit: Payer: Self-pay

## 2013-08-16 DIAGNOSIS — Z1231 Encounter for screening mammogram for malignant neoplasm of breast: Secondary | ICD-10-CM

## 2013-08-20 DIAGNOSIS — J387 Other diseases of larynx: Secondary | ICD-10-CM | POA: Diagnosis not present

## 2013-08-20 DIAGNOSIS — IMO0001 Reserved for inherently not codable concepts without codable children: Secondary | ICD-10-CM | POA: Diagnosis not present

## 2013-08-27 DIAGNOSIS — R609 Edema, unspecified: Secondary | ICD-10-CM | POA: Diagnosis not present

## 2013-08-27 DIAGNOSIS — R0609 Other forms of dyspnea: Secondary | ICD-10-CM | POA: Diagnosis not present

## 2013-08-27 DIAGNOSIS — I10 Essential (primary) hypertension: Secondary | ICD-10-CM | POA: Diagnosis not present

## 2013-08-27 DIAGNOSIS — N318 Other neuromuscular dysfunction of bladder: Secondary | ICD-10-CM | POA: Diagnosis not present

## 2013-08-27 DIAGNOSIS — M899 Disorder of bone, unspecified: Secondary | ICD-10-CM | POA: Diagnosis not present

## 2013-08-27 DIAGNOSIS — R809 Proteinuria, unspecified: Secondary | ICD-10-CM | POA: Diagnosis not present

## 2013-08-27 DIAGNOSIS — Z6841 Body Mass Index (BMI) 40.0 and over, adult: Secondary | ICD-10-CM | POA: Diagnosis not present

## 2013-09-06 DIAGNOSIS — L909 Atrophic disorder of skin, unspecified: Secondary | ICD-10-CM | POA: Diagnosis not present

## 2013-09-06 DIAGNOSIS — L821 Other seborrheic keratosis: Secondary | ICD-10-CM | POA: Diagnosis not present

## 2013-09-06 DIAGNOSIS — D230 Other benign neoplasm of skin of lip: Secondary | ICD-10-CM | POA: Diagnosis not present

## 2013-09-06 DIAGNOSIS — L739 Follicular disorder, unspecified: Secondary | ICD-10-CM | POA: Diagnosis not present

## 2013-09-06 DIAGNOSIS — D1801 Hemangioma of skin and subcutaneous tissue: Secondary | ICD-10-CM | POA: Diagnosis not present

## 2013-09-06 DIAGNOSIS — Z85828 Personal history of other malignant neoplasm of skin: Secondary | ICD-10-CM | POA: Diagnosis not present

## 2013-09-06 DIAGNOSIS — L538 Other specified erythematous conditions: Secondary | ICD-10-CM | POA: Diagnosis not present

## 2013-09-14 ENCOUNTER — Ambulatory Visit
Admission: RE | Admit: 2013-09-14 | Discharge: 2013-09-14 | Disposition: A | Discharge status: Home / Self Care | Payer: Medicare Other | Source: Ambulatory Visit

## 2013-09-14 DIAGNOSIS — Z1231 Encounter for screening mammogram for malignant neoplasm of breast: Secondary | ICD-10-CM | POA: Diagnosis not present

## 2013-09-24 ENCOUNTER — Encounter (HOSPITAL_COMMUNITY)
Admission: RE | Admit: 2013-09-24 | Discharge: 2013-09-24 | Disposition: A | Discharge status: Home / Self Care | Payer: Medicare Other | Source: Ambulatory Visit | Attending: Internal Medicine | Admitting: Internal Medicine

## 2013-09-24 ENCOUNTER — Other Ambulatory Visit (HOSPITAL_COMMUNITY): Payer: Self-pay | Admitting: Internal Medicine

## 2013-09-24 ENCOUNTER — Encounter (HOSPITAL_COMMUNITY): Payer: Self-pay

## 2013-09-24 DIAGNOSIS — M810 Age-related osteoporosis without current pathological fracture: Secondary | ICD-10-CM | POA: Insufficient documentation

## 2013-09-24 MED ORDER — SODIUM CHLORIDE 0.9 % IV SOLN
Freq: Once | INTRAVENOUS | Status: AC
  Administered 2013-09-24: 11:00:00 via INTRAVENOUS

## 2013-09-24 MED ORDER — ZOLEDRONIC ACID 5 MG/100ML IV SOLN
5.0000 mg | Freq: Once | INTRAVENOUS | Status: AC
  Administered 2013-09-24: 5 mg via INTRAVENOUS
  Filled 2013-09-24: qty 100

## 2013-09-25 DIAGNOSIS — R490 Dysphonia: Secondary | ICD-10-CM | POA: Diagnosis not present

## 2013-09-25 DIAGNOSIS — J387 Other diseases of larynx: Secondary | ICD-10-CM | POA: Diagnosis not present

## 2013-10-03 DIAGNOSIS — R5381 Other malaise: Secondary | ICD-10-CM | POA: Diagnosis not present

## 2013-10-03 DIAGNOSIS — R490 Dysphonia: Secondary | ICD-10-CM | POA: Diagnosis not present

## 2013-10-03 DIAGNOSIS — I10 Essential (primary) hypertension: Secondary | ICD-10-CM | POA: Diagnosis not present

## 2013-10-03 DIAGNOSIS — Z6841 Body Mass Index (BMI) 40.0 and over, adult: Secondary | ICD-10-CM | POA: Diagnosis not present

## 2013-10-03 DIAGNOSIS — R609 Edema, unspecified: Secondary | ICD-10-CM | POA: Diagnosis not present

## 2013-10-04 DIAGNOSIS — Z23 Encounter for immunization: Secondary | ICD-10-CM | POA: Diagnosis not present

## 2013-11-15 DIAGNOSIS — M6530 Trigger finger, unspecified finger: Secondary | ICD-10-CM | POA: Diagnosis not present

## 2013-11-22 DIAGNOSIS — M545 Low back pain, unspecified: Secondary | ICD-10-CM | POA: Diagnosis not present

## 2013-11-28 DIAGNOSIS — M545 Low back pain, unspecified: Secondary | ICD-10-CM | POA: Diagnosis not present

## 2013-11-30 DIAGNOSIS — M545 Low back pain, unspecified: Secondary | ICD-10-CM | POA: Diagnosis not present

## 2013-12-04 DIAGNOSIS — M545 Low back pain, unspecified: Secondary | ICD-10-CM | POA: Diagnosis not present

## 2013-12-10 DIAGNOSIS — M48061 Spinal stenosis, lumbar region without neurogenic claudication: Secondary | ICD-10-CM | POA: Diagnosis not present

## 2013-12-10 DIAGNOSIS — R109 Unspecified abdominal pain: Secondary | ICD-10-CM | POA: Diagnosis not present

## 2013-12-10 DIAGNOSIS — M25559 Pain in unspecified hip: Secondary | ICD-10-CM | POA: Diagnosis not present

## 2013-12-10 DIAGNOSIS — M545 Low back pain, unspecified: Secondary | ICD-10-CM | POA: Diagnosis not present

## 2013-12-11 DIAGNOSIS — M545 Low back pain, unspecified: Secondary | ICD-10-CM | POA: Diagnosis not present

## 2013-12-18 DIAGNOSIS — M545 Low back pain, unspecified: Secondary | ICD-10-CM | POA: Diagnosis not present

## 2014-01-02 DIAGNOSIS — M169 Osteoarthritis of hip, unspecified: Secondary | ICD-10-CM | POA: Diagnosis not present

## 2014-01-02 DIAGNOSIS — M47817 Spondylosis without myelopathy or radiculopathy, lumbosacral region: Secondary | ICD-10-CM | POA: Diagnosis not present

## 2014-01-02 DIAGNOSIS — M5137 Other intervertebral disc degeneration, lumbosacral region: Secondary | ICD-10-CM | POA: Diagnosis not present

## 2014-01-02 DIAGNOSIS — M1610 Unilateral primary osteoarthritis, unspecified hip: Secondary | ICD-10-CM | POA: Diagnosis not present

## 2014-01-09 DIAGNOSIS — M5137 Other intervertebral disc degeneration, lumbosacral region: Secondary | ICD-10-CM | POA: Diagnosis not present

## 2014-01-09 DIAGNOSIS — M169 Osteoarthritis of hip, unspecified: Secondary | ICD-10-CM | POA: Diagnosis not present

## 2014-01-09 DIAGNOSIS — R32 Unspecified urinary incontinence: Secondary | ICD-10-CM | POA: Diagnosis not present

## 2014-01-09 DIAGNOSIS — M47817 Spondylosis without myelopathy or radiculopathy, lumbosacral region: Secondary | ICD-10-CM | POA: Diagnosis not present

## 2014-01-09 DIAGNOSIS — M25559 Pain in unspecified hip: Secondary | ICD-10-CM | POA: Diagnosis not present

## 2014-01-09 DIAGNOSIS — M1610 Unilateral primary osteoarthritis, unspecified hip: Secondary | ICD-10-CM | POA: Diagnosis not present

## 2014-01-10 DIAGNOSIS — R350 Frequency of micturition: Secondary | ICD-10-CM | POA: Diagnosis not present

## 2014-01-10 DIAGNOSIS — N3020 Other chronic cystitis without hematuria: Secondary | ICD-10-CM | POA: Diagnosis not present

## 2014-01-10 DIAGNOSIS — N3946 Mixed incontinence: Secondary | ICD-10-CM | POA: Diagnosis not present

## 2014-01-11 DIAGNOSIS — M5137 Other intervertebral disc degeneration, lumbosacral region: Secondary | ICD-10-CM | POA: Diagnosis not present

## 2014-01-11 DIAGNOSIS — M47817 Spondylosis without myelopathy or radiculopathy, lumbosacral region: Secondary | ICD-10-CM | POA: Diagnosis not present

## 2014-01-15 DIAGNOSIS — M5126 Other intervertebral disc displacement, lumbar region: Secondary | ICD-10-CM | POA: Diagnosis not present

## 2014-01-15 DIAGNOSIS — M5137 Other intervertebral disc degeneration, lumbosacral region: Secondary | ICD-10-CM | POA: Diagnosis not present

## 2014-01-15 DIAGNOSIS — M47817 Spondylosis without myelopathy or radiculopathy, lumbosacral region: Secondary | ICD-10-CM | POA: Diagnosis not present

## 2014-01-15 DIAGNOSIS — M48061 Spinal stenosis, lumbar region without neurogenic claudication: Secondary | ICD-10-CM | POA: Diagnosis not present

## 2014-01-17 DIAGNOSIS — M545 Low back pain, unspecified: Secondary | ICD-10-CM | POA: Diagnosis not present

## 2014-01-17 DIAGNOSIS — M5137 Other intervertebral disc degeneration, lumbosacral region: Secondary | ICD-10-CM | POA: Diagnosis not present

## 2014-01-17 DIAGNOSIS — M47817 Spondylosis without myelopathy or radiculopathy, lumbosacral region: Secondary | ICD-10-CM | POA: Diagnosis not present

## 2014-02-01 DIAGNOSIS — N318 Other neuromuscular dysfunction of bladder: Secondary | ICD-10-CM | POA: Diagnosis not present

## 2014-02-01 DIAGNOSIS — N3020 Other chronic cystitis without hematuria: Secondary | ICD-10-CM | POA: Diagnosis not present

## 2014-02-01 DIAGNOSIS — Z6839 Body mass index (BMI) 39.0-39.9, adult: Secondary | ICD-10-CM | POA: Diagnosis not present

## 2014-02-01 DIAGNOSIS — N3946 Mixed incontinence: Secondary | ICD-10-CM | POA: Diagnosis not present

## 2014-02-01 DIAGNOSIS — I10 Essential (primary) hypertension: Secondary | ICD-10-CM | POA: Diagnosis not present

## 2014-02-01 DIAGNOSIS — F329 Major depressive disorder, single episode, unspecified: Secondary | ICD-10-CM | POA: Diagnosis not present

## 2014-02-01 DIAGNOSIS — M4800 Spinal stenosis, site unspecified: Secondary | ICD-10-CM | POA: Diagnosis not present

## 2014-02-13 DIAGNOSIS — M5137 Other intervertebral disc degeneration, lumbosacral region: Secondary | ICD-10-CM | POA: Diagnosis not present

## 2014-02-13 DIAGNOSIS — M545 Low back pain, unspecified: Secondary | ICD-10-CM | POA: Diagnosis not present

## 2014-02-13 DIAGNOSIS — M47817 Spondylosis without myelopathy or radiculopathy, lumbosacral region: Secondary | ICD-10-CM | POA: Diagnosis not present

## 2014-02-13 DIAGNOSIS — IMO0002 Reserved for concepts with insufficient information to code with codable children: Secondary | ICD-10-CM | POA: Diagnosis not present

## 2014-02-25 DIAGNOSIS — R350 Frequency of micturition: Secondary | ICD-10-CM | POA: Diagnosis not present

## 2014-02-25 DIAGNOSIS — N3946 Mixed incontinence: Secondary | ICD-10-CM | POA: Diagnosis not present

## 2014-02-25 DIAGNOSIS — N3020 Other chronic cystitis without hematuria: Secondary | ICD-10-CM | POA: Diagnosis not present

## 2014-02-26 DIAGNOSIS — IMO0002 Reserved for concepts with insufficient information to code with codable children: Secondary | ICD-10-CM | POA: Diagnosis not present

## 2014-02-26 DIAGNOSIS — M4800 Spinal stenosis, site unspecified: Secondary | ICD-10-CM | POA: Diagnosis not present

## 2014-02-26 DIAGNOSIS — G250 Essential tremor: Secondary | ICD-10-CM | POA: Diagnosis not present

## 2014-02-26 DIAGNOSIS — M1610 Unilateral primary osteoarthritis, unspecified hip: Secondary | ICD-10-CM | POA: Diagnosis not present

## 2014-02-26 DIAGNOSIS — F329 Major depressive disorder, single episode, unspecified: Secondary | ICD-10-CM | POA: Diagnosis not present

## 2014-02-26 DIAGNOSIS — M47817 Spondylosis without myelopathy or radiculopathy, lumbosacral region: Secondary | ICD-10-CM | POA: Diagnosis not present

## 2014-02-26 DIAGNOSIS — I10 Essential (primary) hypertension: Secondary | ICD-10-CM | POA: Diagnosis not present

## 2014-02-26 DIAGNOSIS — M5137 Other intervertebral disc degeneration, lumbosacral region: Secondary | ICD-10-CM | POA: Diagnosis not present

## 2014-02-26 DIAGNOSIS — Z6839 Body mass index (BMI) 39.0-39.9, adult: Secondary | ICD-10-CM | POA: Diagnosis not present

## 2014-04-11 DIAGNOSIS — R609 Edema, unspecified: Secondary | ICD-10-CM | POA: Diagnosis not present

## 2014-04-11 DIAGNOSIS — I10 Essential (primary) hypertension: Secondary | ICD-10-CM | POA: Diagnosis not present

## 2014-04-11 DIAGNOSIS — M949 Disorder of cartilage, unspecified: Secondary | ICD-10-CM | POA: Diagnosis not present

## 2014-04-11 DIAGNOSIS — M899 Disorder of bone, unspecified: Secondary | ICD-10-CM | POA: Diagnosis not present

## 2014-04-11 DIAGNOSIS — R7301 Impaired fasting glucose: Secondary | ICD-10-CM | POA: Diagnosis not present

## 2014-04-17 DIAGNOSIS — N3020 Other chronic cystitis without hematuria: Secondary | ICD-10-CM | POA: Diagnosis not present

## 2014-04-17 DIAGNOSIS — N390 Urinary tract infection, site not specified: Secondary | ICD-10-CM | POA: Diagnosis not present

## 2014-04-17 DIAGNOSIS — N3946 Mixed incontinence: Secondary | ICD-10-CM | POA: Diagnosis not present

## 2014-04-18 DIAGNOSIS — R609 Edema, unspecified: Secondary | ICD-10-CM | POA: Diagnosis not present

## 2014-04-18 DIAGNOSIS — I10 Essential (primary) hypertension: Secondary | ICD-10-CM | POA: Diagnosis not present

## 2014-04-18 DIAGNOSIS — M949 Disorder of cartilage, unspecified: Secondary | ICD-10-CM | POA: Diagnosis not present

## 2014-04-18 DIAGNOSIS — R490 Dysphonia: Secondary | ICD-10-CM | POA: Diagnosis not present

## 2014-04-18 DIAGNOSIS — Z0000 Encounter for general adult medical examination without abnormal findings: Secondary | ICD-10-CM | POA: Diagnosis not present

## 2014-04-18 DIAGNOSIS — M899 Disorder of bone, unspecified: Secondary | ICD-10-CM | POA: Diagnosis not present

## 2014-04-18 DIAGNOSIS — R269 Unspecified abnormalities of gait and mobility: Secondary | ICD-10-CM | POA: Diagnosis not present

## 2014-04-18 DIAGNOSIS — F329 Major depressive disorder, single episode, unspecified: Secondary | ICD-10-CM | POA: Diagnosis not present

## 2014-04-18 DIAGNOSIS — R7301 Impaired fasting glucose: Secondary | ICD-10-CM | POA: Diagnosis not present

## 2014-04-18 DIAGNOSIS — Z23 Encounter for immunization: Secondary | ICD-10-CM | POA: Diagnosis not present

## 2014-04-19 DIAGNOSIS — Z1212 Encounter for screening for malignant neoplasm of rectum: Secondary | ICD-10-CM | POA: Diagnosis not present

## 2014-04-24 ENCOUNTER — Encounter: Payer: Self-pay | Admitting: Neurology

## 2014-04-24 ENCOUNTER — Ambulatory Visit (INDEPENDENT_AMBULATORY_CARE_PROVIDER_SITE_OTHER): Payer: Medicare Other | Admitting: Neurology

## 2014-04-24 VITALS — BP 118/66 | HR 100 | Resp 20 | Ht 60.5 in | Wt 209.0 lb

## 2014-04-24 DIAGNOSIS — G252 Other specified forms of tremor: Secondary | ICD-10-CM

## 2014-04-24 DIAGNOSIS — G250 Essential tremor: Secondary | ICD-10-CM | POA: Insufficient documentation

## 2014-04-24 MED ORDER — PRIMIDONE 50 MG PO TABS: ORAL_TABLET | ORAL | Status: DC

## 2014-04-24 NOTE — Progress Notes (Signed)
[name] was seen today in the movement disorders clinic for neurologic consultation at the request of Martha Clan, MD.  The consultation is for the evaluation of tremor.  The records that were made available to me were reviewed.  Pt is R hand dominant.  Pt reports tremor for 20-30 years, but was mild when it started.  It started in both hands.  Her father had the same thing, as did her sister and her son now has the same thing.  It didn't really start to bother her until 5-6 years ago.  She started on primidone at that time, 50 mg and it helped.  It was increased to 100 mg and it helped as well.  No SE with the medication.  Over the last few years, tremor has continued to pick up, and she is noticing it in the legs now as well.  She notices that anxiety/stress will increase the tremor.   In January, the patient ran out of her primidone for 4-5 days and she really noted a deterioration in the tremor.  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  She does not necessarily doze unexpectedly, but is very sleepy during the day.  She has an appointment with Dr. Frances Furbish to discuss this.    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  She did not do this and she states that her voice has "come back" on its own. Sleep: sleeps well at night, but has EDS so getting ready to do PSG  Vivid Dreams:  yes  Acting out dreams:  yes (screams/talks) Wet Pillows: yes Postural symptoms:  yes, especially on unlevel  terrain  Falls?  no but near falls Bradykinesia symptoms: drooling while awake Loss of smell:  yes Loss of taste:  yes Urinary Incontinence:  yes Difficulty Swallowing:  yes but better with zegerid Handwriting, micrographia: no but smaller and more tremulous than previous Depression:  yes (currently changing from effexor to cymbalta) Memory changes:  yes (word finding trouble) Lightheaded:  no  Syncope: no Diplopia:  no Dyskinesia:  no      ALLERGIES:   Allergies  Allergen Reactions  . Codeine Nausea And Vomiting  . Morphine And Related Nausea And Vomiting    CURRENT MEDICATIONS:  Current Outpatient Prescriptions on File Prior to Visit  Medication Sig Dispense Refill  . amLODipine (NORVASC) 5 MG tablet Take 5 mg by mouth every morning.       . Calcium Carbonate (CALTRATE 600 PO) Take 1 tablet by mouth daily.      . Cholecalciferol (VITAMIN D-3) 1000 UNITS CAPS Take by mouth daily.      . hydrochlorothiazide (MICROZIDE) 12.5 MG capsule Take 12.5 mg by mouth daily.      . Multiple Vitamin (MULTIVITAMIN WITH MINERALS) TABS Take 1 tablet by mouth daily.      Marland Kitchen omeprazole-sodium bicarbonate (ZEGERID) 40-1100 MG per capsule Take 1 capsule by mouth daily  before breakfast.      . primidone (MYSOLINE) 50 MG tablet Take 50 mg by mouth at bedtime.       . traMADol (ULTRAM) 50 MG tablet Take 50 mg by mouth daily.      Marland Kitchen venlafaxine XR (EFFEXOR-XR) 150 MG 24 hr capsule Take 75 mg by mouth every morning.       . zoledronic acid (RECLAST) 5 MG/100ML SOLN Inject 5 mg into the vein once. A year in the fall       No current facility-administered medications on file prior to visit.    PAST MEDICAL HISTORY:   Past Medical History  Diagnosis Date  . Osteoporosis   . Hypertension   . GERD (gastroesophageal reflux disease)   . 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 Gallstones   . Wears glasses     PAST SURGICAL HISTORY:   Past Surgical History  Procedure Laterality Date  .  Bladder repair  [phone]  . Carpal tunnel release  1992    right arm  . Rotator cuff repair  2004    two on right, one on left  . 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 Trigger finger release      right hand  . Lumbar laminectomy/decompression microdiscectomy  11/30/2012    Procedure: LUMBAR LAMINECTOMY/DECOMPRESSION MICRODISCECTOMY 2 LEVELS;  Surgeon: Magnus Sinning, MD;  Location: [facility];  Service: Orthopedics;  Laterality: N/A;  DECOMPRESSION LAMINECTOMY L4-L5, L5-S1 CENTRAL   . Eus N/A 03/09/2013    Procedure: UPPER ENDOSCOPIC ULTRASOUND (EUS) LINEAR;  Surgeon: Beryle Beams, MD;  Location: [facility];  Service: Endoscopy;  Laterality: N/A;  . Abdominal hysterectomy  1983  . Cholecystectomy N/A 03/29/2013    Procedure: LAPAROSCOPIC CHOLECYSTECTOMY WITH INTRAOPERATIVE CHOLANGIOGRAM;  Surgeon: Haywood Lasso, MD;  Location: [place];  Service: General;  Laterality: N/A;    SOCIAL HISTORY:   History   Social History  . Marital Status: Married    Spouse Name: N/A    Number of Children: N/A  . Years of Education: N/A   Occupational History  . interior designer     partially retired   Social History Main Topics  . Smoking status: Former Smoker -- 0.50 packs/day    Types: Cigarettes    Quit date: 07/11/1970  . Smokeless tobacco: Not on file  . Alcohol Use: Yes     Comment: wine occasionally  . Drug Use: No  . Sexual Activity: Not Currently   Other Topics Concern  . Not on file   Social History Narrative  . No narrative on file    FAMILY HISTORY:   Family Status  Relation Status Death Age  . Mother Deceased 69    sarcoma  . Father Deceased     complications from broken hip, ET  . Sister Deceased     blood clot, ET  . Son Alive     healthy, [place]  . Daughter Alive     healthy    ROS:  A complete 10 system review of systems was obtained and was unremarkable apart from what is mentioned above.  PHYSICAL EXAMINATION:     VITALS:   Filed Vitals:   04/24/14 0848  BP: 118/66  Pulse: 100  Resp: 20  Height: 5' 0.5" (1.537 m)  Weight: 209 lb (94.802 kg)    GEN:  The patient appears stated age and is in NAD. HEENT:  Normocephalic, atraumatic.  The mucous membranes are moist. The superficial temporal arteries are without ropiness or tenderness. CV:  RRR Lungs:  CTAB Neck/HEME:  There are no carotid bruits bilaterally.  Neurological examination:  Orientation: The patient is alert and oriented x3. Fund of knowledge is appropriate.  Recent and remote memory are intact.  Attention and concentration are normal.    Able to name objects and repeat phrases. Cranial nerves: There is good facial symmetry. Pupils are equal round and reactive to light bilaterally. Fundoscopic exam reveals clear margins bilaterally. Extraocular muscles are intact. The visual fields are full to confrontational testing. The speech is fluent and clear.  I heard no laryngeal dystonia/spasmodic dysphonia. Soft palate rises symmetrically and there is no tongue deviation. Hearing is intact to conversational tone. Sensation: Sensation is intact to light and pinprick throughout (facial, trunk, extremities). Vibration is intact at the bilateral big toe. There is no extinction with double simultaneous stimulation. There is no sensory dermatomal level identified. Motor: Strength is 5/5 in the bilateral upper and lower extremities.   Shoulder shrug is equal and symmetric.  There is no pronator drift. Deep tendon reflexes: Deep tendon reflexes are 2/4 at the bilateral biceps, triceps, brachioradialis, patella and achilles. Plantar response is upgoing on the R and downgoing on the L.    Movement examination: Tone: There is normal tone in the bilateral upper extremities.  The tone in the lower extremities is normal.  Abnormal movements: There is no tremor at rest.  There is tremor of the outstretched hands that significantly increases with intention  bilaterally.  She has difficulty drawing Archimedes spirals bilaterally.  She has minimal difficulty pouring water from one glass to another.  She has just mild difficulty writing a sentence in cursive. Coordination:  There is no significant decremation with RAM's, with any form of rapid alternating movement including alternating supination and pronation of the forearm, hand opening and closing, finger taps, heel taps and toe taps. Gait and Station: The patient has no significan difficulty arising out of a deep-seated chair without the use of the hands. The patient's stride length is just slightly decreased and her gait is tenuous.  She pivots when she turns.    Labs:  I reviewed the labs from her primary care physician, which were just done on 04/11/2014.  AST was 17, ALT 14 and alkaline phosphatase was normal at 67.  Her CBC was unremarkable.  BUN was 18 and creatinine 0.7.  Her TSH was normal at 1.20.  Hemoglobin A1c was normal at 5.7.  ASSESSMENT/PLAN:  1.  Essential tremor.  -I see no evidence of a neurodegenerative process,  and think  that this just represented a progression of the essential tremor.  She and I discussed this today.  I really do not think that the primidone is causing daytime hypersomnolence, especially since she has been on it for years and especially since she is only taking it at nighttime.  I am going to try to increase this and add a daytime dosage, but I told her to go ahead and wait until she sees [name] for her sleep consultation next week before she does this.  Once she sees her and get her okay, we will increase it to 50 mg in the morning and 100 mg at night.  If she has any side effects, we can certainly change to a different medication.  Risks, benefits, side effects and alternative therapies were discussed.  The opportunity to ask questions was given  and they were answered to the best of my ability.  The patient expressed understanding and willingness to follow the  outlined treatment protocols.  -ET pt education provided. 2.  Hx AVM repair with coiling  -She does have an upgoing toe on the right, which I think is likely just a result of her history of AVM, which she reports was on the left.  I see nothing else focal or lateralizing on her examination today, so decided to hold off on any further neuroimaging.   3.  daytime hypersomnolence.  -She has an upcoming appointment on Monday with [name] to discuss possible nocturnal polysomnogram to rule out sleep apnea. 4.  I will plan on seeing her back in 3 months, sooner should new neurologic issues arise.

## 2014-04-24 NOTE — Patient Instructions (Signed)
1. Please start Primidone 50 mg - 1 tablet in the morning, 2 tablets at night. This prescription has been sent to your pharmacy.  2. Follow up 3 months.

## 2014-04-29 ENCOUNTER — Ambulatory Visit (INDEPENDENT_AMBULATORY_CARE_PROVIDER_SITE_OTHER): Payer: Medicare Other | Admitting: Neurology

## 2014-04-29 ENCOUNTER — Encounter: Payer: Self-pay | Admitting: Neurology

## 2014-04-29 VITALS — BP 140/79 | HR 91 | Temp 98.5°F | Ht 60.0 in | Wt 205.0 lb

## 2014-04-29 DIAGNOSIS — R600 Localized edema: Secondary | ICD-10-CM

## 2014-04-29 DIAGNOSIS — E669 Obesity, unspecified: Secondary | ICD-10-CM | POA: Diagnosis not present

## 2014-04-29 DIAGNOSIS — R609 Edema, unspecified: Secondary | ICD-10-CM | POA: Diagnosis not present

## 2014-04-29 DIAGNOSIS — R351 Nocturia: Secondary | ICD-10-CM | POA: Diagnosis not present

## 2014-04-29 DIAGNOSIS — R400 Somnolence: Secondary | ICD-10-CM

## 2014-04-29 DIAGNOSIS — R0989 Other specified symptoms and signs involving the circulatory and respiratory systems: Secondary | ICD-10-CM

## 2014-04-29 DIAGNOSIS — R0609 Other forms of dyspnea: Secondary | ICD-10-CM

## 2014-04-29 DIAGNOSIS — G252 Other specified forms of tremor: Secondary | ICD-10-CM

## 2014-04-29 DIAGNOSIS — G4710 Hypersomnia, unspecified: Secondary | ICD-10-CM

## 2014-04-29 DIAGNOSIS — G250 Essential tremor: Secondary | ICD-10-CM

## 2014-04-29 DIAGNOSIS — R0683 Snoring: Secondary | ICD-10-CM

## 2014-04-29 NOTE — Progress Notes (Signed)
Subjective:    Patient ID: [name] is a 73 y.o. female.  HPI    [name], MD, PhD [facility] [address] [place], [place] [address]  Dear Dr. [name],   I saw your patient, [name], upon your kind request in my neurologic clinic today for initial consultation of her sleep disorder, in particular, concern for underlying obstructive sleep apnea. The patient's unaccompanied today. 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 She has been on Effexor XR 225 mg daily and has recently tapered off of it over the course of a week and has been on Cymbalta 30 mg. She is retired as an Futures trader, but still works some. She has had some additional stressors. She endorses EDS and her ESS is 10/24. She has not fallen asleep driving. Her husband is 45 years old, and recently had eye surgery. She denies gasping for air and is not sure that she has witnessed apneas. She does not have morning headaches, but has had some headaches since she has been on Mobic. She has nocturia x 2, which is better since the ABx. She drinks caffeine, but has cut back on her sodas to none after 2 PM. She likes Coke zero. She does not take a scheduled nap. She denies RLS symptoms, and is not sure if she kicks in her sleep. She goes to bed by 10 PM and likes to read on her Melanee Spry, and she is asleep by 11 PM. She has taken the occasional tylenol PM.   There is no report of nighttime reflux, now that she is on Zegerid. There  Is family history of RLS in her mother and  suspected OSA in her father, who also had a tremor.  She is not a very restless sleeper. She likes to sleep on her right side, but can sleep on her stomach and her back.  She denies cataplexy, sleep paralysis, hypnagogic or hypnopompic hallucinations, or sleep attacks. She does not report any vivid dreams, nightmares, dream enactments, or parasomnias, such as sleep talking or sleep walking. The patient has not had a sleep study or a home sleep test. Her bedroom is usually dark and cool. There is a TV in the bedroom and usually it is not on at night.   Her Past Medical History Is Significant For: Past Medical History  Diagnosis Date  . Osteoporosis   . Hypertension   . GERD (gastroesophageal reflux disease)   . 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 Gallstones   . Wears glasses     Her Past Surgical History Is Significant For: Past Surgical History  Procedure Laterality Date  . Bladder repair  [phone]  . Carpal tunnel release  1992    right arm  . Rotator cuff repair  2004    two on right, one on left  . 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 Trigger finger release      right hand  . Lumbar laminectomy/decompression microdiscectomy  11/30/2012    Procedure: LUMBAR LAMINECTOMY/DECOMPRESSION MICRODISCECTOMY 2 LEVELS;  Surgeon: Laurice Record  Aplington, MD;  Location: [facility];  Service: Orthopedics;  Laterality: N/A;  DECOMPRESSION LAMINECTOMY L4-L5, L5-S1 CENTRAL   . Eus N/A 03/09/2013    Procedure: UPPER ENDOSCOPIC ULTRASOUND (EUS) LINEAR;  Surgeon: Beryle Beams, MD;  Location: [facility];  Service: Endoscopy;  Laterality: N/A;  . Abdominal hysterectomy  1983  . Cholecystectomy N/A 03/29/2013    Procedure: LAPAROSCOPIC CHOLECYSTECTOMY WITH INTRAOPERATIVE CHOLANGIOGRAM;  Surgeon: Haywood Lasso, MD;  Location: [facility];  Service: General;  Laterality: N/A;    Her Family History Is Significant For: History reviewed. No pertinent family  history.  Her Social History Is Significant For: History   Social History  . Marital Status: Married    Spouse Name: Richard    Number of Children: 2  . Years of Education: 68   Occupational History  . interior designer     partially retired   Social History Main Topics  . Smoking status: Former Smoker -- 0.50 packs/day    Types: Cigarettes    Quit date: 07/11/1970  . Smokeless tobacco: Never Used  . Alcohol Use: Yes     Comment: wine occasionally  . Drug Use: No  . Sexual Activity: Not Currently   Other Topics Concern  . None   Social History Narrative   Patient is right handed, resides with spouse in a home.    Her Allergies Are:  Allergies  Allergen Reactions  . Codeine Nausea And Vomiting  . Morphine And Related Nausea And Vomiting  :   Her Current Medications Are:  Outpatient Encounter Prescriptions as of 04/29/2014  Medication Sig  . amLODipine (NORVASC) 5 MG tablet Take 5 mg by mouth every morning.   . Calcium Carbonate (CALTRATE 600 PO) Take 1 tablet by mouth daily.  . cetirizine (ZYRTEC) 10 MG tablet Take 10 mg by mouth daily.  . Cholecalciferol (VITAMIN D-3) 1000 UNITS CAPS Take by mouth daily.  . DULoxetine (CYMBALTA) 30 MG capsule Take 30 mg by mouth daily.  . hydrochlorothiazide (MICROZIDE) 12.5 MG capsule Take 12.5 mg by mouth daily.  . meloxicam (MOBIC) 15 MG tablet Take 15 mg by mouth daily.  . Multiple Vitamin (MULTIVITAMIN WITH MINERALS) TABS Take 1 tablet by mouth daily.  Marland Kitchen omeprazole-sodium bicarbonate (ZEGERID) 40-1100 MG per capsule Take 1 capsule by mouth daily before breakfast.  . primidone (MYSOLINE) 50 MG tablet Take 1 tablet in the morning, 2 tablets at night  . traMADol (ULTRAM) 50 MG tablet Take 50 mg by mouth daily.  . zoledronic acid (RECLAST) 5 MG/100ML SOLN Inject 5 mg into the vein once. A year in the fall  . [DISCONTINUED] trimethoprim (TRIMPEX) 100 MG tablet Take 100 mg by mouth 2 (two) times daily.  . [DISCONTINUED]  venlafaxine XR (EFFEXOR-XR) 150 MG 24 hr capsule Take 75 mg by mouth every morning.   :  Review of Systems:  Out of a complete 14 point review of systems, all are reviewed and negative with the exception of these symptoms as listed below:  Review of Systems  Constitutional: Positive for fatigue.  HENT: Positive for trouble swallowing.   Cardiovascular: Positive for leg swelling.  Gastrointestinal:       Incontinence  Endocrine:       Flushing  Neurological: Positive for tremors.       Slow memory, sleepiness  Psychiatric/Behavioral:       Depression,too much sleep,decreased energy    Objective:  Neurologic Exam  Physical Exam Physical Examination:   Filed Vitals:  04/29/14 1403  BP: 140/79  Pulse: 91  Temp: 98.5 F (36.9 C)    General Examination: The patient is a very pleasant 73 y.o. female in no acute distress. She appears well-developed and well-nourished and well groomed. She is overweight.   HEENT: Normocephalic, atraumatic, pupils are equal, round and reactive to light and accommodation. Funduscopic exam is normal with sharp disc margins noted. Extraocular tracking is good without limitation to gaze excursion or nystagmus noted. Normal smooth pursuit is noted. Hearing is grossly intact. Tympanic membranes are clear bilaterally. Face is symmetric with normal facial animation and normal facial sensation. 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 There is slight hypophonia. There is no lip, neck/head, jaw or voice tremor. Neck is supple with full range of passive and active motion. There are no carotid bruits on auscultation. Oropharynx exam reveals: moderate mouth dryness, adequate dental hygiene and moderate airway crowding, due to redundant soft palate and long but thin uvula. Mallampati is class II. Tongue protrudes centrally and palate elevates symmetrically. Tonsils are 1+ in size. Neck size is 16.25 inches. She has a tiny overbite. Nasal  inspection reveals no significant nasal mucosal bogginess or redness and no septal deviation.  Chest: Clear to auscultation without wheezing, rhonchi or crackles noted.  Heart: S1+S2+0, regular and normal without murmurs, rubs or gallops noted.   Abdomen: Soft, non-tender and non-distended with normal bowel sounds appreciated on auscultation.  Extremities: There is 1+ pitting edema in the distal lower extremities bilaterally. Pedal pulses are intact.  Skin: Warm and dry without trophic changes noted. There are no varicose veins.  Musculoskeletal: exam reveals no obvious joint deformities, tenderness or joint swelling or erythema.   Neurologically:  Mental status: The patient is awake, alert and oriented in all 4 spheres. Her immediate and remote memory, attention, language skills and fund of knowledge are appropriate. There is no evidence of aphasia, agnosia, apraxia or anomia. Speech is clear with normal prosody and enunciation. Thought process is linear. Mood is normal and affect is normal.  Cranial nerves II - XII are as described above under HEENT exam. In addition: shoulder shrug is normal with equal shoulder height noted. Motor exam: Normal bulk, strength and tone is noted. There is no drift, rest tremor or rebound. She has a mild b/l UE postural and action tremor. Romberg is negative. Reflexes are 2+ throughout. Babinski: Toes are flexor bilaterally. Fine motor skills and coordination: intact with normal finger taps, normal hand movements, normal rapid alternating patting, normal foot taps and normal foot agility.  Cerebellar testing: No dysmetria or intention tremor on finger to nose testing. Heel to shin is unremarkable bilaterally. There is no truncal or gait ataxia.  Sensory exam: intact to light touch, pinprick, vibration, temperature sense in the upper and lower extremities.  Gait, station and balance: She stands easily. No veering to one side is noted. No leaning to one side is  noted. Posture is age-appropriate and stance is narrow based. Gait shows normal stride length and normal pace. No problems turning are noted. She turns en bloc. Tandem walk is unremarkable. Intact toe and heel stance is noted.               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 She also has evidence of essential tremor which appears to be mild at this time. She has been on Mysoline and she has seen Dr. Carles Collet for this. She is currently on Cymbalta and has recently phased out of taking Effexor which she had been on since 2001.  I had a long chat with the patient about my findings and the diagnosis of OSA, its prognosis and treatment options. We talked about medical treatments, surgical interventions and non-pharmacological approaches. I explained in particular the risks and ramifications of untreated moderate to severe OSA, especially with respect to developing cardiovascular disease down the Road, including congestive heart failure, difficult to treat hypertension, cardiac arrhythmias, or stroke. Even type 2 diabetes has, in part, been linked to untreated OSA. Symptoms of untreated OSA include daytime sleepiness, memory problems, chronic edema, nocturia, mood irritability and mood disorder such as depression and anxiety, lack of energy, as well as recurrent  headaches, especially morning headaches. We talked about trying to maintain a healthy lifestyle in general, as well as the importance of weight control. I encouraged the patient to eat healthy, exercise daily and keep well hydrated, to keep a scheduled bedtime and wake time routine, to not skip any meals and eat healthy snacks in between meals. I advised the patient not to drive when feeling sleepy. I recommended the following at this time: sleep study with potential positive airway pressure titration.  I explained the sleep test procedure to the patient and also outlined possible surgical and non-surgical treatment options of OSA, including the use of a custom-made dental device (which would require a referral to a specialist dentist or oral surgeon), upper airway surgical options, such as pillar implants, radiofrequency surgery, tongue base surgery, and UPPP (which would involve a referral to an ENT surgeon). Rarely, jaw surgery such as mandibular advancement may be considered.  I also explained the CPAP treatment option to the patient, who indicated that she would be willing to try CPAP if the need arises. While she is not overly excited about the prospect of using a CPAP machine, she would be willing to give it a try. I explained the importance of being compliant with PAP treatment, not only for insurance purposes but primarily to improve Her symptoms, and for the patient's long term health benefit, including to reduce Her cardiovascular risks. I answered all her questions today and the patient was in agreement. I would like to see her back after the sleep study is completed and encouraged her to call with any interim questions, concerns, problems or updates.   Thank you very much for allowing me to participate in the care of this nice patient. If I can be of any further assistance to you please do not hesitate to call me at [phone].  Sincerely,   [name], MD, PhD

## 2014-04-29 NOTE — Patient Instructions (Signed)

## 2014-05-02 DIAGNOSIS — L909 Atrophic disorder of skin, unspecified: Secondary | ICD-10-CM | POA: Diagnosis not present

## 2014-05-02 DIAGNOSIS — L821 Other seborrheic keratosis: Secondary | ICD-10-CM | POA: Diagnosis not present

## 2014-05-02 DIAGNOSIS — L819 Disorder of pigmentation, unspecified: Secondary | ICD-10-CM | POA: Diagnosis not present

## 2014-05-02 DIAGNOSIS — L820 Inflamed seborrheic keratosis: Secondary | ICD-10-CM | POA: Diagnosis not present

## 2014-05-02 DIAGNOSIS — D239 Other benign neoplasm of skin, unspecified: Secondary | ICD-10-CM | POA: Diagnosis not present

## 2014-05-02 DIAGNOSIS — Z85828 Personal history of other malignant neoplasm of skin: Secondary | ICD-10-CM | POA: Diagnosis not present

## 2014-05-02 DIAGNOSIS — D1801 Hemangioma of skin and subcutaneous tissue: Secondary | ICD-10-CM | POA: Diagnosis not present

## 2014-05-23 DIAGNOSIS — H04223 Epiphora due to insufficient drainage, bilateral lacrimal glands: Secondary | ICD-10-CM | POA: Insufficient documentation

## 2014-05-24 DIAGNOSIS — H04229 Epiphora due to insufficient drainage, unspecified lacrimal gland: Secondary | ICD-10-CM | POA: Diagnosis not present

## 2014-05-24 DIAGNOSIS — H5200 Hypermetropia, unspecified eye: Secondary | ICD-10-CM | POA: Diagnosis not present

## 2014-05-24 DIAGNOSIS — H02839 Dermatochalasis of unspecified eye, unspecified eyelid: Secondary | ICD-10-CM | POA: Diagnosis not present

## 2014-05-24 DIAGNOSIS — H2589 Other age-related cataract: Secondary | ICD-10-CM | POA: Diagnosis not present

## 2014-05-24 DIAGNOSIS — H11159 Pinguecula, unspecified eye: Secondary | ICD-10-CM | POA: Diagnosis not present

## 2014-05-24 DIAGNOSIS — H11829 Conjunctivochalasis, unspecified eye: Secondary | ICD-10-CM | POA: Diagnosis not present

## 2014-05-30 DIAGNOSIS — Z6841 Body Mass Index (BMI) 40.0 and over, adult: Secondary | ICD-10-CM | POA: Diagnosis not present

## 2014-05-30 DIAGNOSIS — I10 Essential (primary) hypertension: Secondary | ICD-10-CM | POA: Diagnosis not present

## 2014-05-30 DIAGNOSIS — G250 Essential tremor: Secondary | ICD-10-CM | POA: Diagnosis not present

## 2014-05-30 DIAGNOSIS — R7301 Impaired fasting glucose: Secondary | ICD-10-CM | POA: Diagnosis not present

## 2014-05-30 DIAGNOSIS — F329 Major depressive disorder, single episode, unspecified: Secondary | ICD-10-CM | POA: Diagnosis not present

## 2014-05-30 DIAGNOSIS — G252 Other specified forms of tremor: Secondary | ICD-10-CM | POA: Diagnosis not present

## 2014-06-05 ENCOUNTER — Ambulatory Visit (INDEPENDENT_AMBULATORY_CARE_PROVIDER_SITE_OTHER): Payer: Medicare Other | Admitting: Neurology

## 2014-06-05 DIAGNOSIS — G479 Sleep disorder, unspecified: Secondary | ICD-10-CM

## 2014-06-05 DIAGNOSIS — H2589 Other age-related cataract: Secondary | ICD-10-CM | POA: Diagnosis not present

## 2014-06-05 DIAGNOSIS — G4730 Sleep apnea, unspecified: Secondary | ICD-10-CM

## 2014-06-05 DIAGNOSIS — G4710 Hypersomnia, unspecified: Secondary | ICD-10-CM

## 2014-06-05 DIAGNOSIS — G4733 Obstructive sleep apnea (adult) (pediatric): Secondary | ICD-10-CM

## 2014-06-05 DIAGNOSIS — R400 Somnolence: Secondary | ICD-10-CM

## 2014-06-05 DIAGNOSIS — R600 Localized edema: Secondary | ICD-10-CM

## 2014-06-05 DIAGNOSIS — R0683 Snoring: Secondary | ICD-10-CM

## 2014-06-05 DIAGNOSIS — E669 Obesity, unspecified: Secondary | ICD-10-CM

## 2014-06-05 DIAGNOSIS — R351 Nocturia: Secondary | ICD-10-CM

## 2014-06-10 DIAGNOSIS — Z885 Allergy status to narcotic agent status: Secondary | ICD-10-CM | POA: Diagnosis not present

## 2014-06-10 DIAGNOSIS — H2589 Other age-related cataract: Secondary | ICD-10-CM | POA: Diagnosis not present

## 2014-06-10 DIAGNOSIS — Z79899 Other long term (current) drug therapy: Secondary | ICD-10-CM | POA: Diagnosis not present

## 2014-06-10 DIAGNOSIS — Z87440 Personal history of urinary (tract) infections: Secondary | ICD-10-CM | POA: Diagnosis not present

## 2014-06-10 DIAGNOSIS — E559 Vitamin D deficiency, unspecified: Secondary | ICD-10-CM | POA: Diagnosis not present

## 2014-06-10 DIAGNOSIS — G252 Other specified forms of tremor: Secondary | ICD-10-CM | POA: Diagnosis not present

## 2014-06-10 DIAGNOSIS — Z87891 Personal history of nicotine dependence: Secondary | ICD-10-CM | POA: Diagnosis not present

## 2014-06-10 DIAGNOSIS — J309 Allergic rhinitis, unspecified: Secondary | ICD-10-CM | POA: Diagnosis not present

## 2014-06-10 DIAGNOSIS — I10 Essential (primary) hypertension: Secondary | ICD-10-CM | POA: Diagnosis not present

## 2014-06-10 DIAGNOSIS — G250 Essential tremor: Secondary | ICD-10-CM | POA: Diagnosis not present

## 2014-06-10 DIAGNOSIS — F329 Major depressive disorder, single episode, unspecified: Secondary | ICD-10-CM | POA: Diagnosis not present

## 2014-06-10 DIAGNOSIS — Z7982 Long term (current) use of aspirin: Secondary | ICD-10-CM | POA: Diagnosis not present

## 2014-06-10 DIAGNOSIS — F3289 Other specified depressive episodes: Secondary | ICD-10-CM | POA: Diagnosis not present

## 2014-06-10 DIAGNOSIS — K219 Gastro-esophageal reflux disease without esophagitis: Secondary | ICD-10-CM | POA: Diagnosis not present

## 2014-06-10 DIAGNOSIS — Z886 Allergy status to analgesic agent status: Secondary | ICD-10-CM | POA: Diagnosis not present

## 2014-06-10 DIAGNOSIS — H538 Other visual disturbances: Secondary | ICD-10-CM | POA: Diagnosis not present

## 2014-06-11 DIAGNOSIS — Z961 Presence of intraocular lens: Secondary | ICD-10-CM | POA: Diagnosis not present

## 2014-06-18 DIAGNOSIS — Z48810 Encounter for surgical aftercare following surgery on the sense organs: Secondary | ICD-10-CM | POA: Diagnosis not present

## 2014-06-18 DIAGNOSIS — Z9849 Cataract extraction status, unspecified eye: Secondary | ICD-10-CM | POA: Diagnosis not present

## 2014-06-18 DIAGNOSIS — Z961 Presence of intraocular lens: Secondary | ICD-10-CM | POA: Diagnosis not present

## 2014-06-19 ENCOUNTER — Telehealth: Payer: Self-pay | Admitting: Neurology

## 2014-06-19 DIAGNOSIS — G4733 Obstructive sleep apnea (adult) (pediatric): Secondary | ICD-10-CM

## 2014-06-19 NOTE — Telephone Encounter (Signed)
Please call and notify patient that the recent sleep study confirmed the diagnosis of OSA. She did well with CPAP during the study with significant improvement of the respiratory events. Therefore, I would like start the patient on CPAP at home. I placed the order in the chart.   Arrange for CPAP set up at home through a DME company of patient's choice and fax/route report to PCP and referring MD (if other than PCP).   The patient will also need a follow up appointment with me in 6-8 weeks post set up that has to be scheduled; help the patient schedule this (in a follow-up slot).   Please re-enforce the importance of compliance with treatment and the need for [place] to monitor compliance data.   Once you have spoken to the patient and scheduled the return appointment, you may close this encounter, thanks,   [name], MD, PhD [facility]

## 2014-06-20 ENCOUNTER — Encounter: Payer: Self-pay | Admitting: *Deleted

## 2014-06-20 NOTE — Telephone Encounter (Signed)
I called and spoke with the patient about her recent sleep study results. I informed the patient that the recent study confirmed the diagnosis of obstructive sleep apnea and that she did well on CPAP during the night of the study with significant improvement of the respiratory events. [name] recommend starting CPAP therapy at home. I will send the order to [facility]. I will also fax a copy of the report to Dr. Jacquelynn Cree office and mail a copy to the patient along with a follow up instruction letter.

## 2014-07-23 DIAGNOSIS — H2589 Other age-related cataract: Secondary | ICD-10-CM | POA: Diagnosis not present

## 2014-07-23 DIAGNOSIS — Z961 Presence of intraocular lens: Secondary | ICD-10-CM | POA: Diagnosis not present

## 2014-07-24 DIAGNOSIS — J040 Acute laryngitis: Secondary | ICD-10-CM | POA: Insufficient documentation

## 2014-07-24 DIAGNOSIS — H9190 Unspecified hearing loss, unspecified ear: Secondary | ICD-10-CM | POA: Insufficient documentation

## 2014-07-24 DIAGNOSIS — M47816 Spondylosis without myelopathy or radiculopathy, lumbar region: Secondary | ICD-10-CM | POA: Insufficient documentation

## 2014-07-24 DIAGNOSIS — R32 Unspecified urinary incontinence: Secondary | ICD-10-CM | POA: Insufficient documentation

## 2014-07-24 DIAGNOSIS — J309 Allergic rhinitis, unspecified: Secondary | ICD-10-CM | POA: Insufficient documentation

## 2014-07-24 DIAGNOSIS — J383 Other diseases of vocal cords: Secondary | ICD-10-CM | POA: Insufficient documentation

## 2014-07-24 DIAGNOSIS — R490 Dysphonia: Secondary | ICD-10-CM | POA: Insufficient documentation

## 2014-07-24 DIAGNOSIS — M5136 Other intervertebral disc degeneration, lumbar region: Secondary | ICD-10-CM | POA: Insufficient documentation

## 2014-07-24 DIAGNOSIS — M169 Osteoarthritis of hip, unspecified: Secondary | ICD-10-CM | POA: Insufficient documentation

## 2014-07-29 ENCOUNTER — Encounter: Payer: Self-pay | Admitting: Neurology

## 2014-07-29 ENCOUNTER — Ambulatory Visit (INDEPENDENT_AMBULATORY_CARE_PROVIDER_SITE_OTHER): Payer: Medicare Other | Admitting: Neurology

## 2014-07-29 VITALS — BP 132/74 | HR 88 | Resp 20 | Ht 60.0 in | Wt 206.0 lb

## 2014-07-29 DIAGNOSIS — G4733 Obstructive sleep apnea (adult) (pediatric): Secondary | ICD-10-CM | POA: Diagnosis not present

## 2014-07-29 DIAGNOSIS — G252 Other specified forms of tremor: Secondary | ICD-10-CM | POA: Diagnosis not present

## 2014-07-29 DIAGNOSIS — G250 Essential tremor: Secondary | ICD-10-CM | POA: Diagnosis not present

## 2014-07-29 NOTE — Progress Notes (Signed)
[name] was seen today in the movement disorders clinic for neurologic consultation at the request of [name], MD.  The consultation is for the evaluation of tremor.  The records that were made available to me were reviewed.  Pt is R hand dominant.  Pt reports tremor for 20-30 years, but was mild when it started.  It started in both hands.  Her father had the same thing, as did her sister and her son now has the same thing.  It didn't really start to bother her until 5-6 years ago.  She started on primidone at that time, 50 mg and it helped.  It was increased to 100 mg and it helped as well.  No SE with the medication.  Over the last few years, tremor has continued to pick up, and she is noticing it in the legs now as well.  She notices that anxiety/stress will increase the tremor.   In January, the patient ran out of her primidone for 4-5 days and she really noted a deterioration in the tremor.  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  She does not necessarily doze unexpectedly, but is very sleepy during the day.  She has an appointment with [name] to discuss this.   07/29/14 update:  Last visit, I increased the patients primidone to 50 mg in the AM and she is on 100 mg at night.  She is doing better in terms of tremor.  She has good days and bad days and she knows that stress makes it worse.  She did see [name] since last visit and I reviewed those notes.  She had a PSG.  She had an AHI of 60.6 but this was all in supine position and AHI in lateral position was 0.0 (but I was unsure of how much time, if any, was spent in the lateral position).  CPAP at 6 was recommended but she admits that she "hasn't given in to that" and wants to talk with Dr. [name].    ALLERGIES:   Allergies  Allergen Reactions  . Codeine Nausea And Vomiting  . Morphine And Related Nausea And Vomiting    CURRENT MEDICATIONS:  Current Outpatient Prescriptions on File Prior  to Visit  Medication Sig Dispense Refill  . amLODipine (NORVASC) 5 MG tablet Take 5 mg by mouth every morning.       . Calcium Carbonate (CALTRATE 600 PO) Take 1 tablet by mouth daily.      . cetirizine (ZYRTEC) 10 MG tablet Take 10 mg by mouth daily.      . Cholecalciferol (VITAMIN D-3) 1000 UNITS CAPS Take by mouth daily.      . DULoxetine (CYMBALTA) 30 MG capsule Take 30 mg by mouth daily.      . hydrochlorothiazide (MICROZIDE) 12.5 MG capsule Take 12.5 mg by mouth daily.      . meloxicam (MOBIC) 15 MG tablet Take 15 mg by mouth daily.      . Multiple Vitamin (MULTIVITAMIN WITH MINERALS) TABS Take 1 tablet by mouth daily.      Marland Kitchen omeprazole-sodium bicarbonate (ZEGERID) 40-1100 MG per capsule Take 1 capsule by mouth daily before breakfast.      . primidone (MYSOLINE) 50 MG tablet Take 1 tablet in the morning, 2 tablets at night  90 tablet  3  . traMADol (ULTRAM) 50 MG tablet Take 50 mg by mouth daily.      Marland Kitchen  zoledronic acid (RECLAST) 5 MG/100ML SOLN Inject 5 mg into the vein once. A year in the fall       No current facility-administered medications on file prior to visit.    PAST MEDICAL HISTORY:   Past Medical History  Diagnosis Date  . Osteoporosis   . Hypertension   . GERD (gastroesophageal reflux disease)   . 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 Gallstones   . Wears glasses     PAST SURGICAL HISTORY:   Past Surgical History  Procedure Laterality Date  . Bladder repair  [phone]  . Carpal tunnel release  1992    right arm  . Rotator cuff repair  2004    two on right, one on left  . 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 Trigger finger release      right hand  . Lumbar laminectomy/decompression microdiscectomy  11/30/2012    Procedure: LUMBAR LAMINECTOMY/DECOMPRESSION MICRODISCECTOMY 2 LEVELS;  Surgeon: Magnus Sinning, MD;  Location: [facility];  Service: Orthopedics;  Laterality: N/A;  DECOMPRESSION LAMINECTOMY L4-L5, L5-S1 CENTRAL   . Eus N/A  03/09/2013    Procedure: UPPER ENDOSCOPIC ULTRASOUND (EUS) LINEAR;  Surgeon: Beryle Beams, MD;  Location: [facility];  Service: Endoscopy;  Laterality: N/A;  . Abdominal hysterectomy  1983  . Cholecystectomy N/A 03/29/2013    Procedure: LAPAROSCOPIC CHOLECYSTECTOMY WITH INTRAOPERATIVE CHOLANGIOGRAM;  Surgeon: Haywood Lasso, MD;  Location: [facility];  Service: General;  Laterality: N/A;    SOCIAL HISTORY:   History   Social History  . Marital Status: Married    Spouse Name: Richard    Number of Children: 2  . Years of Education: 64   Occupational History  . interior designer     partially retired   Social History Main Topics  . Smoking status: Former Smoker -- 0.50 packs/day    Types: Cigarettes    Quit date: 07/11/1970  . Smokeless tobacco: Never Used  . Alcohol Use: Yes     Comment: wine occasionally  . Drug Use: No  . Sexual Activity: Not Currently   Other Topics Concern  . Not on file   Social History Narrative   Patient is right handed, resides with spouse in a home.    FAMILY HISTORY:   Family Status  Relation Status Death Age  . Mother Deceased 19    sarcoma  . Father Deceased     complications from broken hip, ET  . Sister Deceased     blood clot, ET  . Son Alive     healthy, [place]  . Daughter Alive     healthy    ROS:  A complete 10 system review of systems was obtained and was unremarkable apart from what is mentioned above.  PHYSICAL EXAMINATION:    VITALS:   Filed Vitals:   07/29/14 1012  BP: 132/74  Pulse: 88  Resp: 20  Height: 5' (1.524 m)  Weight: 206 lb (93.441 kg)    GEN:  The patient appears stated age and is in NAD. HEENT:  Normocephalic, atraumatic.  The mucous membranes are moist. The superficial temporal arteries are without ropiness or tenderness. CV:  RRR Lungs:  CTAB Neck/HEME:  There are no carotid bruits bilaterally.  Neurological examination:  Orientation: The patient is alert and oriented x3.  Fund of knowledge is appropriate.  Recent and remote memory are intact.  Attention and concentration are normal.  Able to name objects and repeat phrases. Cranial nerves: There is good facial symmetry. Pupils are equal round and reactive to light bilaterally. Fundoscopic exam reveals clear margins bilaterally. Extraocular muscles are intact. The visual fields are full to confrontational testing. The speech is fluent and clear.  I heard no laryngeal dystonia/spasmodic dysphonia. Soft palate rises symmetrically and there is no tongue deviation. Hearing is intact to conversational tone. Sensation: Sensation is intact to light and pinprick throughout (facial, trunk, extremities). Vibration is intact at the bilateral big toe. There is no extinction with double simultaneous stimulation. There is no sensory dermatomal level identified. Motor: Strength is 5/5 in the bilateral upper and lower extremities.   Shoulder shrug is equal and symmetric.  There is no pronator drift. Deep tendon reflexes: Deep tendon reflexes are 2/4 at the bilateral biceps, triceps, brachioradialis, patella and achilles. Plantar response is upgoing on the R and downgoing on the L.    Movement examination: Tone: There is normal tone in the bilateral upper extremities.  The tone in the lower extremities is normal.  Abnormal movements: There is no tremor at rest.  There is tremor of the outstretched hands that increases with intention bilaterally.  She has some difficulty drawing Archimedes spirals bilaterally.  It is improved on the right.  She still has minimal difficulty pouring water from one glass to another.   Coordination:  There is no significant decremation with RAM's, with any form of rapid alternating movement including alternating supination and pronation of the forearm, hand opening and closing, finger taps, heel taps and toe taps. Gait and Station: The patient has no significan difficulty arising out of a deep-seated chair  without the use of the hands. The patient's stride length is good.    Labs:  I reviewed the labs from her primary care physician, which were just done on 04/11/2014.  AST was 17, ALT 14 and alkaline phosphatase was normal at 67.  Her CBC was unremarkable.  BUN was 18 and creatinine 0.7.  Her TSH was normal at 1.20.  Hemoglobin A1c was normal at 5.7.  ASSESSMENT/PLAN:  1.  Essential tremor.  -I see no evidence of a neurodegenerative process,  and think  that this just represented a progression of the essential tremor.  She and I discussed whether or not we should increase the primidone and we decided to hold for now.  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  She will remain on 50 mg in the AM and 100 mg at night for now.    -ET pt education provided. 2.  Hx AVM repair with coiling  -She does have an upgoing toe on the right, which I think is likely just a result of her history of AVM, which she reports was on the left.  I see nothing else focal or lateralizing on her examination today, so decided to hold off on any further neuroimaging.   3.  OSAS  -We discussed morbidity and mortality of sleep apnea.  I talked to her extensively about this with greater than 50% in counseling.  She is going to contact [facility] and try the CPAP.   4.  I will plan on seeing her back in 6 months, sooner should new neurologic issues arise.

## 2014-08-05 DIAGNOSIS — H2589 Other age-related cataract: Secondary | ICD-10-CM | POA: Diagnosis not present

## 2014-08-05 DIAGNOSIS — I10 Essential (primary) hypertension: Secondary | ICD-10-CM | POA: Diagnosis not present

## 2014-08-05 DIAGNOSIS — K219 Gastro-esophageal reflux disease without esophagitis: Secondary | ICD-10-CM | POA: Diagnosis not present

## 2014-08-06 DIAGNOSIS — Z48810 Encounter for surgical aftercare following surgery on the sense organs: Secondary | ICD-10-CM | POA: Diagnosis not present

## 2014-08-06 DIAGNOSIS — Z9849 Cataract extraction status, unspecified eye: Secondary | ICD-10-CM | POA: Diagnosis not present

## 2014-08-06 DIAGNOSIS — Z961 Presence of intraocular lens: Secondary | ICD-10-CM | POA: Diagnosis not present

## 2014-08-13 DIAGNOSIS — Z961 Presence of intraocular lens: Secondary | ICD-10-CM | POA: Diagnosis not present

## 2014-08-13 DIAGNOSIS — Z9849 Cataract extraction status, unspecified eye: Secondary | ICD-10-CM | POA: Diagnosis not present

## 2014-08-13 DIAGNOSIS — Z48810 Encounter for surgical aftercare following surgery on the sense organs: Secondary | ICD-10-CM | POA: Diagnosis not present

## 2014-08-21 ENCOUNTER — Telehealth: Payer: Self-pay | Admitting: Neurology

## 2014-08-21 MED ORDER — PRIMIDONE 50 MG PO TABS: 100.0000 mg | ORAL_TABLET | Freq: Two times a day (BID) | ORAL | Status: DC

## 2014-08-21 NOTE — Telephone Encounter (Signed)
Spoke with patient - she is starting to paint again and does want to increase Primidone to two tablets in the morning and two tablets in the evening. Patient would like a 90 day supply- Okay to send in a new prescription?

## 2014-08-21 NOTE — Telephone Encounter (Signed)
That is fine with me.

## 2014-08-21 NOTE — Telephone Encounter (Signed)
Pt called requesting to speak to a nurse regarding her meds. Please call pt c/b [phone]

## 2014-08-21 NOTE — Telephone Encounter (Signed)
Prescription sent to patient's pharmacy.

## 2014-09-23 ENCOUNTER — Encounter: Payer: Self-pay | Admitting: Neurology

## 2014-09-25 ENCOUNTER — Encounter: Payer: Self-pay | Admitting: Neurology

## 2014-10-02 DIAGNOSIS — M810 Age-related osteoporosis without current pathological fracture: Secondary | ICD-10-CM | POA: Diagnosis not present

## 2014-10-02 DIAGNOSIS — Z6839 Body mass index (BMI) 39.0-39.9, adult: Secondary | ICD-10-CM | POA: Diagnosis not present

## 2014-10-02 DIAGNOSIS — Z79899 Other long term (current) drug therapy: Secondary | ICD-10-CM | POA: Diagnosis not present

## 2014-10-03 ENCOUNTER — Other Ambulatory Visit: Payer: Self-pay

## 2014-10-03 ENCOUNTER — Other Ambulatory Visit (HOSPITAL_BASED_OUTPATIENT_CLINIC_OR_DEPARTMENT_OTHER): Payer: Self-pay | Admitting: Internal Medicine

## 2014-10-03 DIAGNOSIS — Z1231 Encounter for screening mammogram for malignant neoplasm of breast: Secondary | ICD-10-CM

## 2014-10-10 ENCOUNTER — Ambulatory Visit (INDEPENDENT_AMBULATORY_CARE_PROVIDER_SITE_OTHER): Payer: Medicare Other | Admitting: Neurology

## 2014-10-10 ENCOUNTER — Encounter: Payer: Self-pay | Admitting: Neurology

## 2014-10-10 VITALS — BP 146/77 | HR 87 | Ht 61.0 in | Wt 212.0 lb

## 2014-10-10 DIAGNOSIS — Z9989 Dependence on other enabling machines and devices: Principal | ICD-10-CM

## 2014-10-10 DIAGNOSIS — R600 Localized edema: Secondary | ICD-10-CM

## 2014-10-10 DIAGNOSIS — G4710 Hypersomnia, unspecified: Secondary | ICD-10-CM

## 2014-10-10 DIAGNOSIS — R351 Nocturia: Secondary | ICD-10-CM

## 2014-10-10 DIAGNOSIS — G4733 Obstructive sleep apnea (adult) (pediatric): Secondary | ICD-10-CM | POA: Diagnosis not present

## 2014-10-10 DIAGNOSIS — R400 Somnolence: Secondary | ICD-10-CM

## 2014-10-10 DIAGNOSIS — G250 Essential tremor: Secondary | ICD-10-CM | POA: Diagnosis not present

## 2014-10-10 NOTE — Progress Notes (Signed)
Subjective:    Patient ID: [name] is a 73 y.o. female.  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 Carles Collet), who presents for follow up consultation of her OSA, treated with CPAP. She is unaccompanied today. I first met her on 04/29/14, at the request of her PCP, at which time she reported excessive daytime somnolence, nocturia, and snoring, (per husband). I suggested she return for sleep study. She had a split-night sleep study on 06/05/2014 and I went over her test results with her in detail today. Her baseline sleep efficiency was 68.6% with a latency to sleep of 26 minutes and wake after sleep onset of 16.5 minutes with mild sleep fragmentation noted. She had an elevated arousal index. She had an elevated percentage of light stage sleep, absence of deep sleep, and absence of REM sleep. She had no significant EEG changes and had rare PVCs and PVCs. She had a total AHI of 60.6 per hour. Baseline oxygen saturation was 90% with a nadir of 84%. She was titrated on CPAP. Sleep efficiency was improved during the treatment portion of the study. Arousal index was markedly improved. She achieved deep sleep and REM sleep during the treatment portion of the study. Average oxygen saturation was 91% with a nadir of 84%. Time below 88% saturation during the treatment portion of the study was 3 minutes and 35 seconds. She was titrated from 4-6 cm with a reduction of her AHI to 0.9 events at the final pressure. Supine REM sleep was achieved. Based on her sleep study results I ordered CPAP treatment for her.  I reviewed her compliance data from  08/20/2014 through 09/18/2014 which is a total of 30 days during which time she used her CPAP every night with percent used days greater than 4 hours at 100%, indicating superb compliance, average usage of 8 hours and 10 minutes, pressure at 6 cm with EPR of 3, residual AHI low at 1.9 per hour and leak was low at 4.3 L/m for the 95th percentile.  Today, I reviewed her compliance data from 09/10/2014 through 10/09/2014 which is a total of 30 days during which time she was 100% compliant. Average usage of 8 hours and 17 minutes, residual AHI at 1.8 per hour, leak low. Pressure at 6 cm with EPR of 3.   Today, she reports doing fairly well, she has some daytime tiredness. She has stress. She has an appointment with a therapist soon. She has been on Cymbalta. Nocturia is better. Sleeps seems more consolidated. Of note, when she started CPAP, she also increased her Mysoline right around the same time. Mysoline is very helpful for her tremors and she would not want to cut back on that. Overall, she is committed to treatment with CPAP and feels overall improved. She endorses full compliance and no significant problem with tolerance of the mask or pressure.  She has been on Effexor XR 225 mg daily and tapered off of it over the course of a week and was started on Cymbalta 30 mg. She is retired as an Futures trader, but still works some. She has had some additional stressors. She endorses EDS and her ESS is 10/24.  She has not fallen asleep driving. Her husband is 98 years old, and recently had eye surgery. She denies gasping for air and is not sure that she has witnessed apneas. She does not have morning headaches, but has had some headaches since she has been on Mobic. She has nocturia x 2, which is better since the ABx. She drinks caffeine, but has cut back on her sodas to none after 2 PM. She likes Coke zero. She does not take a scheduled nap. She denies RLS symptoms, and is not sure if she kicks in her sleep. She  goes to bed by 10 PM and likes to read on her Melanee Spry, and she is asleep by 11 PM. She has taken the occasional tylenol PM.  There is no report of nighttime reflux, now that she is on Zegerid. There Is family history of RLS in her mother and suspected OSA in her father, who also had a tremor.  She is not a very restless sleeper. She likes to sleep on her right side, but can sleep on her stomach and her back.  She denies cataplexy, sleep paralysis, hypnagogic or hypnopompic hallucinations, or sleep attacks. She does not report any vivid dreams, nightmares, dream enactments, or parasomnias, such as sleep talking or sleep walking. The patient has not had a sleep study or a home sleep test. Her bedroom is usually dark and cool. There is a TV in the bedroom and usually it is not on at night.   Her Past Medical History Is Significant For: Past Medical History  Diagnosis Date  . Osteoporosis   . Hypertension   . GERD (gastroesophageal reflux disease)   . 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 Gallstones   . Wears glasses     Her Past Surgical History Is Significant For: Past Surgical History  Procedure Laterality Date  . Bladder repair  [phone]  . Carpal tunnel release  1992    right arm  . Rotator cuff repair  2004    two on right, one on left  . Arterial vascular fistula  2004    coiled at [facility]  . Trigger finger release      right hand  . Lumbar laminectomy/decompression microdiscectomy  11/30/2012    Procedure: LUMBAR LAMINECTOMY/DECOMPRESSION MICRODISCECTOMY 2 LEVELS;  Surgeon: Magnus Sinning, MD;  Location: [facility];  Service: Orthopedics;  Laterality: N/A;  DECOMPRESSION LAMINECTOMY L4-L5, L5-S1 CENTRAL   . Eus N/A 03/09/2013    Procedure: UPPER ENDOSCOPIC ULTRASOUND (EUS) LINEAR;  Surgeon: Beryle Beams, MD;  Location: [facility];  Service: Endoscopy;  Laterality: N/A;  . Abdominal hysterectomy  1983  . Cholecystectomy N/A 03/29/2013    Procedure: LAPAROSCOPIC  CHOLECYSTECTOMY WITH INTRAOPERATIVE CHOLANGIOGRAM;  Surgeon: Haywood Lasso, MD;  Location: [facility];  Service: General;  Laterality: N/A;    Her Family History Is Significant For: Family History  Problem Relation Age of Onset  . Other Mother     sarcoma  . Other Father     broken hip    Her Social History Is Significant For: History   Social History  . Marital Status: Married    Spouse Name: Richard    Number of Children: 2  . Years of Education: 20   Occupational History  . interior designer     partially retired   Social History Main Topics  . Smoking status: Former Smoker -- 0.50 packs/day    Types: Cigarettes  Quit date: 07/11/1970  . Smokeless tobacco: Never Used  . Alcohol Use: Yes     Comment: wine occasionally  . Drug Use: No  . Sexual Activity: Not Currently   Other Topics Concern  . None   Social History Narrative   Patient is right handed, resides with spouse in a home.    Her Allergies Are:  Allergies  Allergen Reactions  . Codeine Nausea And Vomiting  . Morphine And Related Nausea And Vomiting  :   Her Current Medications Are:  Outpatient Encounter Prescriptions as of 10/10/2014  Medication Sig  . amLODipine (NORVASC) 5 MG tablet Take 5 mg by mouth every morning.   . Calcium Carbonate (CALTRATE 600 PO) Take 1 tablet by mouth daily.  . cetirizine (ZYRTEC) 10 MG tablet Take 10 mg by mouth daily.  . Cholecalciferol (VITAMIN D-3) 1000 UNITS CAPS Take by mouth daily.  . DULoxetine (CYMBALTA) 30 MG capsule Take 30 mg by mouth daily.  . hydrochlorothiazide (MICROZIDE) 12.5 MG capsule Take 12.5 mg by mouth daily.  . meloxicam (MOBIC) 15 MG tablet Take 15 mg by mouth daily.  . Multiple Vitamin (MULTIVITAMIN WITH MINERALS) TABS Take 1 tablet by mouth daily.  Marland Kitchen omeprazole-sodium bicarbonate (ZEGERID) 40-1100 MG per capsule Take 1 capsule by mouth daily before breakfast.  . primidone (MYSOLINE) 50 MG tablet Take 2 tablets (100 mg  total) by mouth 2 (two) times daily.  . traMADol (ULTRAM) 50 MG tablet Take 50 mg by mouth daily.  . zoledronic acid (RECLAST) 5 MG/100ML SOLN Inject 5 mg into the vein once. A year in the fall  :  Review of Systems:  Out of a complete 14 point review of systems, all are reviewed and negative with the exception of these symptoms as listed below:  Review of Systems  HENT: Positive for facial swelling.   Genitourinary:       Incontinence of bladder, frequency of urination  Skin:       moles  Neurological: Positive for tremors.       Daytime sleepiness  Psychiatric/Behavioral:       Depression    Objective:  Neurologic Exam  Physical Exam Physical Examination:   Filed Vitals:   10/10/14 1025  BP: 146/77  Pulse: 87    General Examination: The patient is a very pleasant 73 y.o. female in no acute distress. She appears well-developed and well-nourished and well groomed. She is overweight.   HEENT: Normocephalic, atraumatic, pupils are equal, round and reactive to light and accommodation. Funduscopic exam is normal with sharp disc margins noted. Extraocular tracking is good without limitation to gaze excursion or nystagmus noted. Normal smooth pursuit is noted. Hearing is grossly intact. Tympanic membranes are clear bilaterally. Face is symmetric with normal facial animation and normal facial sensation. 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 There is slight hypophonia. There is no lip, neck/head, jaw or voice tremor. Neck is supple with full range of passive and active motion. There are no carotid bruits on auscultation. Oropharynx exam reveals: moderate mouth dryness, adequate dental hygiene and moderate airway crowding, due to redundant soft palate and long but thin uvula. Mallampati is class II. Tongue protrudes centrally and palate elevates symmetrically. Tonsils are 1+ in size. Neck size is 16.25 inches. She has a tiny overbite. Nasal inspection  reveals no significant nasal mucosal bogginess or redness and no septal deviation.  Chest: Clear to auscultation without wheezing, rhonchi or crackles noted.  Heart: S1+S2+0, regular  and normal without murmurs, rubs or gallops noted.   Abdomen: Soft, non-tender and non-distended with normal bowel sounds appreciated on auscultation.  Extremities: There is trace edema in the distal lower extremities bilaterally, left more than right. Pedal pulses are intact.  Skin: Warm and dry without trophic changes noted. There are no varicose veins.  Musculoskeletal: exam reveals no obvious joint deformities, tenderness or joint swelling or erythema.   Neurologically:  Mental status: The patient is awake, alert and oriented in all 4 spheres. Her immediate and remote memory, attention, language skills and fund of knowledge are appropriate. There is no evidence of aphasia, agnosia, apraxia or anomia. Speech is clear with normal prosody and enunciation. Thought process is linear. Mood is normal and affect is normal.  Cranial nerves II - XII are as described above under HEENT exam. In addition: shoulder shrug is normal with equal shoulder height noted. Motor exam: Normal bulk, strength and tone is noted. There is no drift, rest tremor or rebound. She has a very subtle b/l UE postural and action tremor, improved from last time. Romberg is negative. Reflexes are 2+ in the upper extremities and 1+ in the lower extremities. Babinski: Toes are flexor bilaterally. Fine motor skills and coordination: intact with normal finger taps, normal hand movements, normal rapid alternating patting, normal foot taps and normal foot agility.  Cerebellar testing: No dysmetria or intention tremor on finger to nose testing. Heel to shin is unremarkable bilaterally. There is no truncal or gait ataxia. Sensory exam: intact to light touch, pinprick, vibration, temperature sense in the upper and lower extremities, but seems to be a little  diminished in the distal lower extremity on the left, perhaps because of the swelling.   Gait, station and balance: She stands easily. No veering to one side is noted. No leaning to one side is noted. Posture is age-appropriate and stance is narrow based. Gait shows normal stride length and normal pace. No problems turning are noted. She turns en bloc. Tandem walk is slightly difficult for her.          Assessment and Plan:   In summary, [name] is a very pleasant 73 year old female with an underlying medical history of hypertension, reflux disease, recurrent headaches, chronic back pain (s/p back surgery, s/p epidural injection in 3/15), osteopenia with vitamin D deficiency, chronic UTI on prophylactic ABx, AVF, status post coiling in 2004, basal cell cancer and essential tremor, who presents for follow-up consultation of her severe obstructive sleep apnea. She is now on treatment with CPAP therapy since approximately 08/20/2014. She is fully compliant. Today, we talked about her sleep test results in detail and I also explained her compliance data in detail her. I congratulated her on her full compliance. She feels improved and some aspects of her sleep including nocturia, sleep consolidation and to some degree her daytime somnolence but she still has daytime tiredness and the need for a nap almost every day. Of note, she endorses stress, works out regularly on her own and with a trainer, and has increased Mysoline around the same time she started CPAP therapy. I think her daytime tiredness may be a combination of different factors. She is pleased overall with how she is doing with CPAP and committed to treatment. I explained the importance of being compliant with PAP treatment, not only for insurance purposes but primarily to improve Her symptoms, and for the patient's long term health benefit, including to reduce Her cardiovascular risks. I would like to  see her back in 6 months for check up of her  OSA.  I answered all her questions today and the patient was in agreement. I encouraged her to call with any interim questions, concerns, problems or updates.

## 2014-10-10 NOTE — Patient Instructions (Signed)

## 2014-10-15 ENCOUNTER — Ambulatory Visit (HOSPITAL_BASED_OUTPATIENT_CLINIC_OR_DEPARTMENT_OTHER)
Admission: RE | Admit: 2014-10-15 | Discharge: 2014-10-15 | Disposition: A | Discharge status: Home / Self Care | Payer: Medicare Other | Source: Ambulatory Visit | Attending: Internal Medicine | Admitting: Internal Medicine

## 2014-10-15 DIAGNOSIS — Z1231 Encounter for screening mammogram for malignant neoplasm of breast: Secondary | ICD-10-CM

## 2014-10-21 ENCOUNTER — Encounter (HOSPITAL_COMMUNITY): Payer: Self-pay

## 2014-10-21 ENCOUNTER — Ambulatory Visit (HOSPITAL_COMMUNITY)
Admission: RE | Admit: 2014-10-21 | Discharge: 2014-10-21 | Disposition: A | Discharge status: Home / Self Care | Payer: Medicare Other | Source: Ambulatory Visit | Attending: Internal Medicine | Admitting: Internal Medicine

## 2014-10-21 ENCOUNTER — Other Ambulatory Visit (HOSPITAL_COMMUNITY): Payer: Self-pay | Admitting: Internal Medicine

## 2014-10-21 DIAGNOSIS — M810 Age-related osteoporosis without current pathological fracture: Secondary | ICD-10-CM | POA: Insufficient documentation

## 2014-10-21 MED ORDER — ZOLEDRONIC ACID 5 MG/100ML IV SOLN
5.0000 mg | Freq: Once | INTRAVENOUS | Status: AC
  Administered 2014-10-21: 5 mg via INTRAVENOUS
  Filled 2014-10-21: qty 100

## 2014-10-21 MED ORDER — SODIUM CHLORIDE 0.9 % IV SOLN
Freq: Once | INTRAVENOUS | Status: AC
  Administered 2014-10-21: 12:00:00 via INTRAVENOUS

## 2014-10-21 NOTE — Discharge Instructions (Signed)

## 2014-10-29 ENCOUNTER — Ambulatory Visit: Payer: Medicare Other

## 2014-11-05 DIAGNOSIS — N3020 Other chronic cystitis without hematuria: Secondary | ICD-10-CM | POA: Diagnosis not present

## 2014-11-05 DIAGNOSIS — N3946 Mixed incontinence: Secondary | ICD-10-CM | POA: Diagnosis not present

## 2014-11-05 DIAGNOSIS — R350 Frequency of micturition: Secondary | ICD-10-CM | POA: Diagnosis not present

## 2014-11-06 IMAGING — CT CT ABD-PELV W/ CM
2 of 5 series · 16 of 46 positions shown, 18 images · IV contrast (APPLIED)
Comparison: None.

CLINICAL DATA: Right upper quadrant pain and nausea.  Gallstone on
bed side ultrasound.

CT ABDOMEN AND PELVIS WITH CONTRAST
TECHNIQUE: Multidetector CT imaging of the abdomen and pelvis was
performed following the standard protocol during bolus
administration of intravenous contrast.
Contrast: 50mL OMNIPAQUE IOHEXOL 300 MG/ML  SOLN, 100mL OMNIPAQUE
IOHEXOL 300 MG/ML  SOLN

[Series 2: abd/pelvis 5.0 b31f · axial · 0.81mm/px · z∈[-272,+144]mm · 13 of 95 slices shown, 15 images]
[im 6/95  soft-tissue]
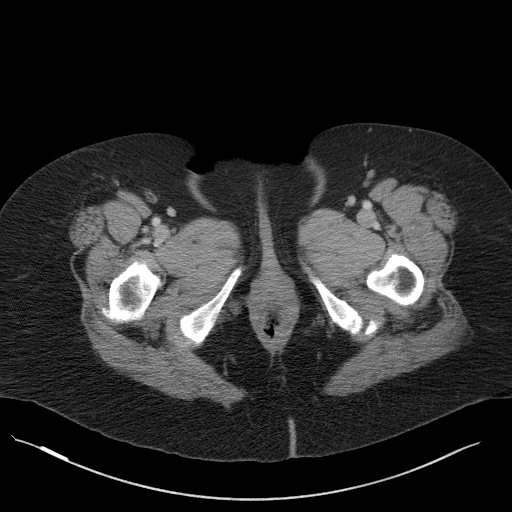
[im 6/95  bone]
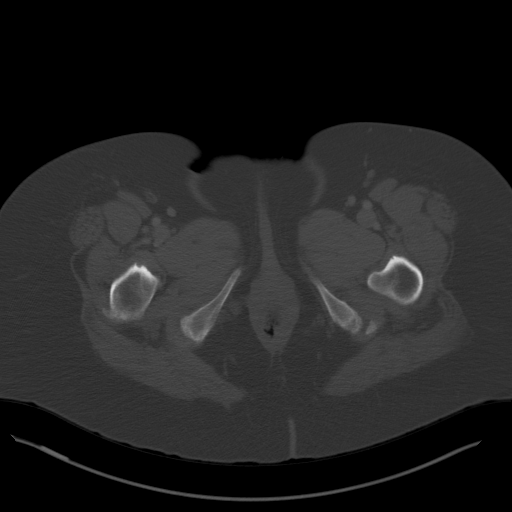
[im 12/95  soft-tissue]
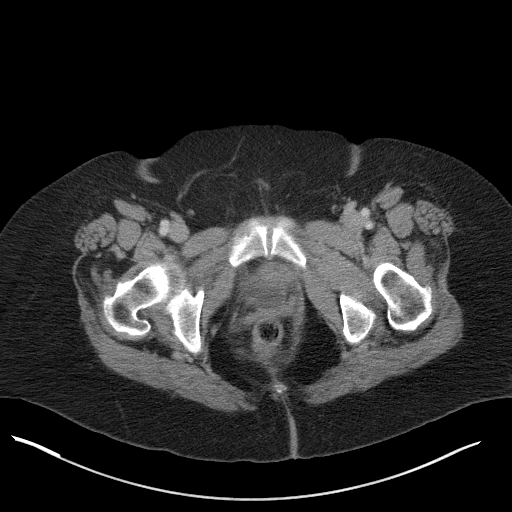
[im 23/95  soft-tissue]
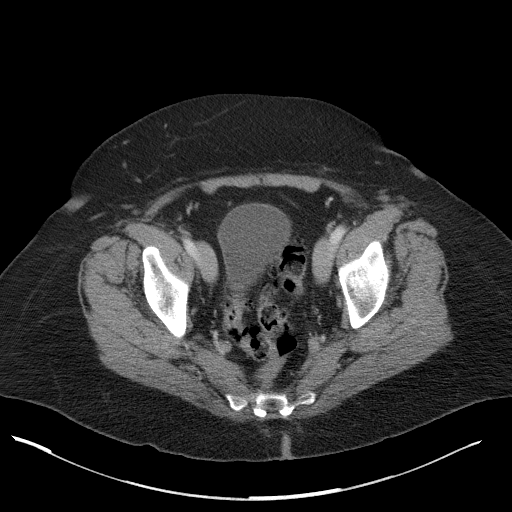
[im 28/95  soft-tissue]
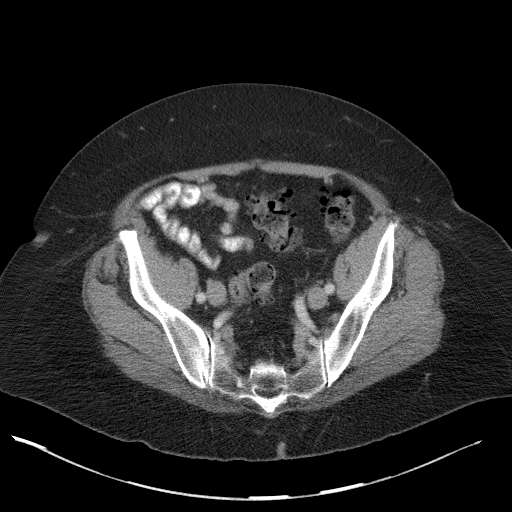
[im 34/95  soft-tissue]
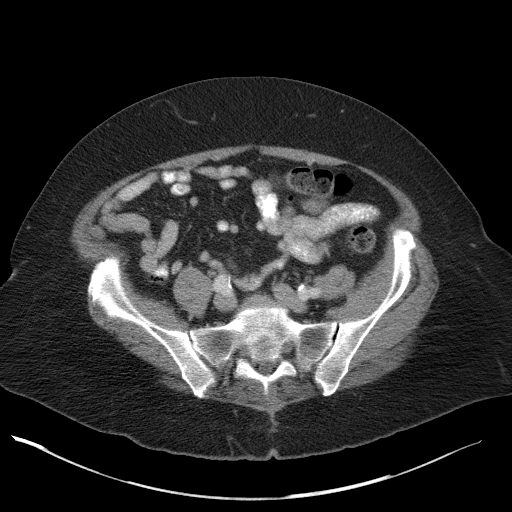
[im 39/95  soft-tissue]
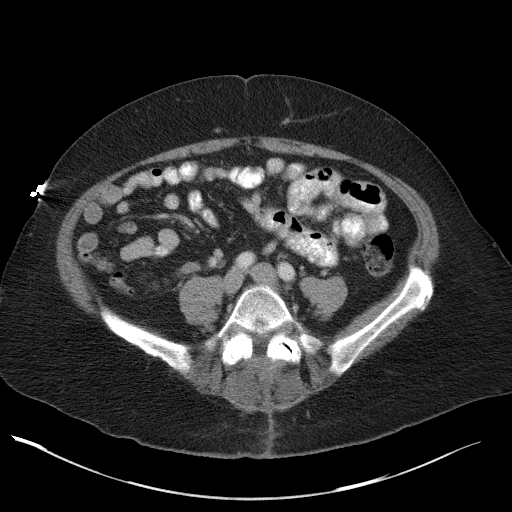
[im 50/95  soft-tissue]
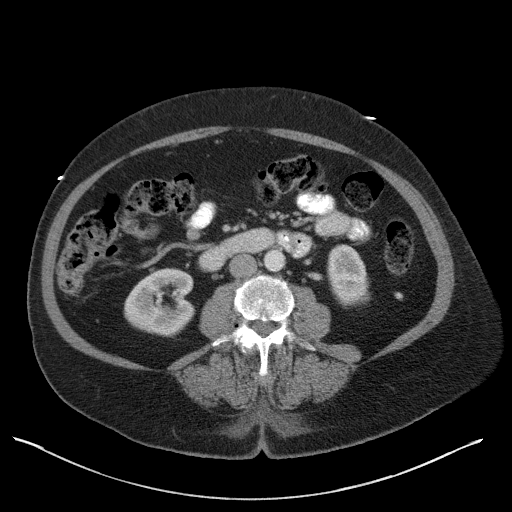
[im 56/95  soft-tissue]
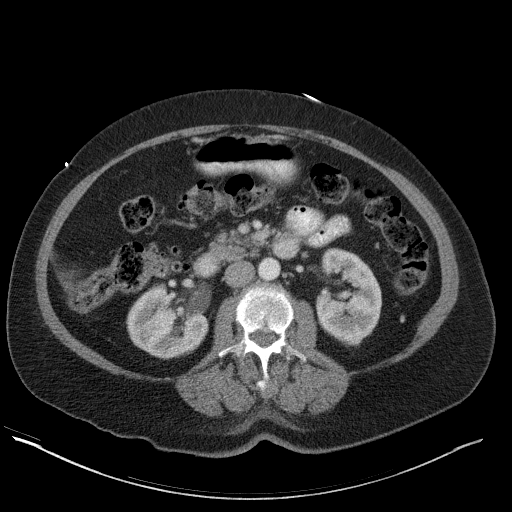
[im 61/95  soft-tissue]
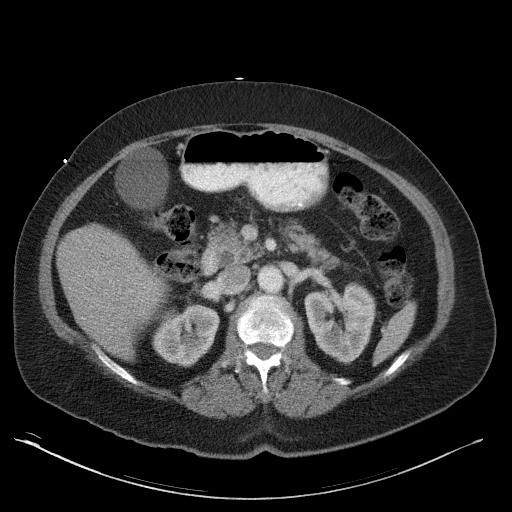
[im 61/95  bone]
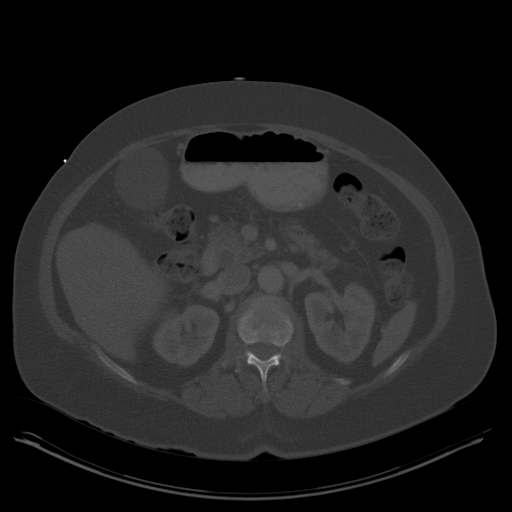
[im 67/95  soft-tissue]
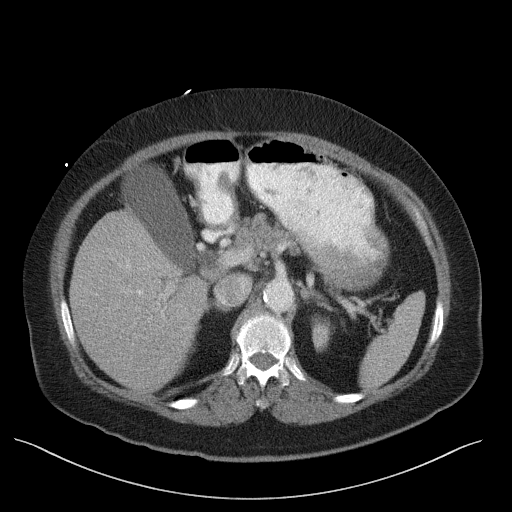
[im 72/95  soft-tissue]
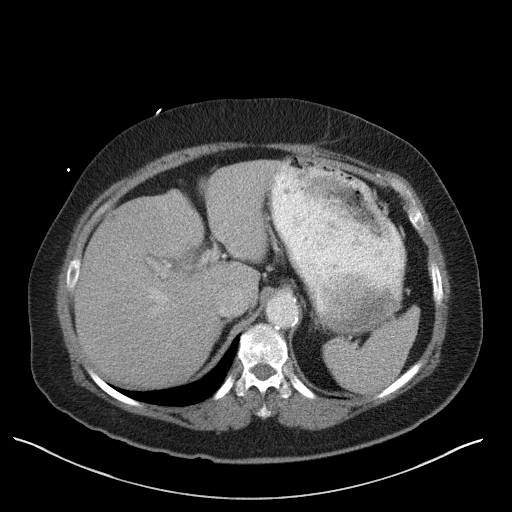
[im 83/95  soft-tissue]
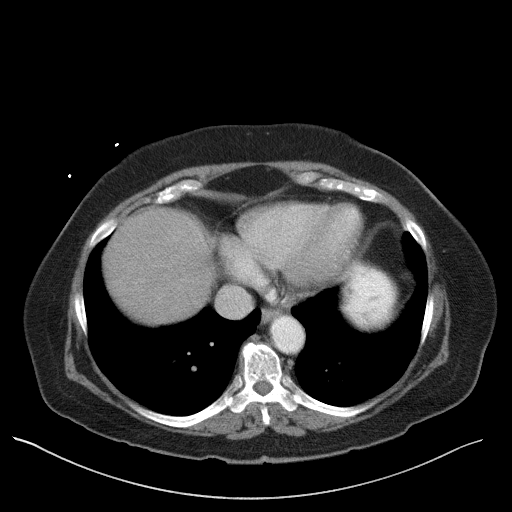
[im 89/95  soft-tissue]
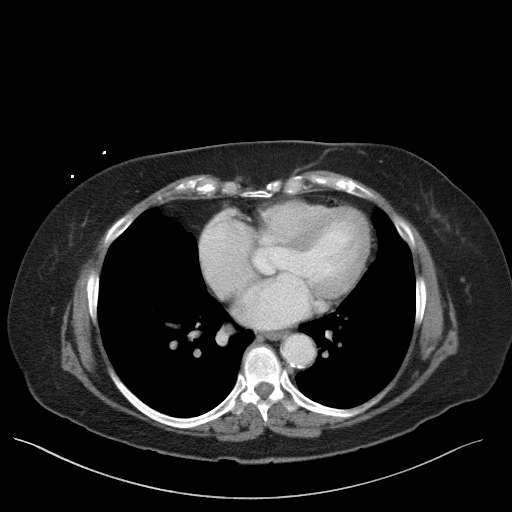

[Series 5: abd/pelvis 3.0 coronal · coronal · 0.82mm/px · 3 of 101 slices shown]
[im 34/101  soft-tissue]
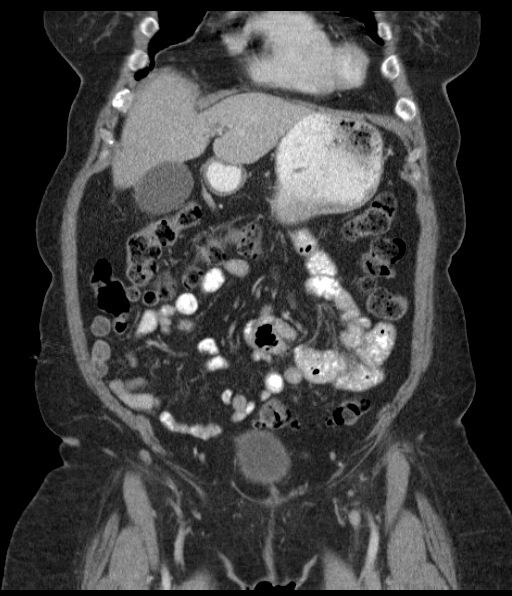
[im 45/101  soft-tissue]
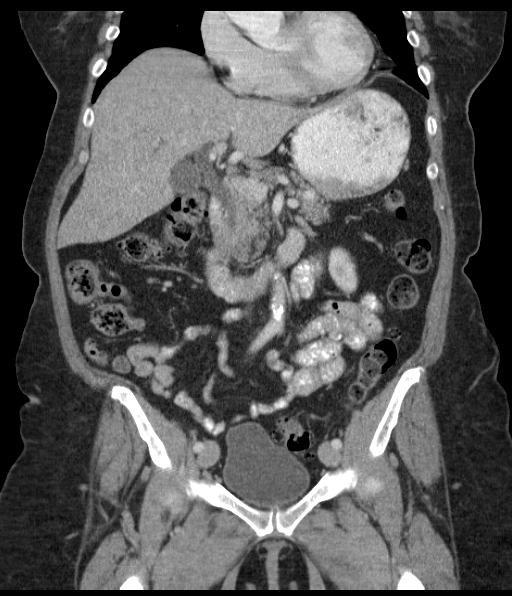
[im 56/101  soft-tissue]
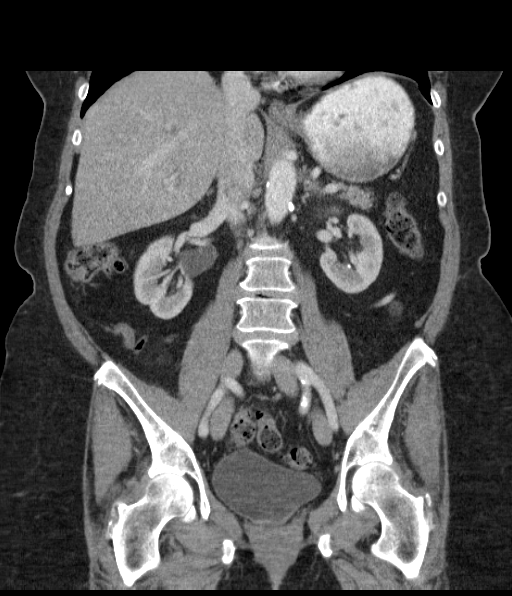

[16 of 46 positions shown; findings below may reference images not displayed]

FINDINGS: Minimal dependent changes in the lung bases.

Mild gallbladder distension without wall thickening or
infiltration.  No gallstones are visualized but ultrasound is more
sensitive for detection of gallstones.  The there is extrahepatic
bile duct dilatation, measuring up to about 18 mm diameter, with
minimal prominence of the intrahepatic bile ducts. No common duct
stone is identified. There is mild prominence of the inferior
accessory pancreatic duct. Main pancreatic duct is not enlarged.
Configuration suggests possible pancreas divisum. No pancreatic
mass or infiltration.

The liver, spleen, adrenal glands, inferior vena cava, and
retroperitoneal lymph nodes are otherwise unremarkable.  Punctate
stone in the lower pole of the left kidney without obstruction.  No
hydronephrosis in either kidney.  Calcification of the abdominal
aorta without aneurysm.  The stomach, small bowel, and colon are
unremarkable.  The colon is stool-filled.  No free air or free
fluid in the abdomen.  Moderately prominent visceral adipose
tissues.  Small accessory spleen.

Pelvis:  The uterus appears to be surgically absent.  The bladder
wall is not thickened.  Stool filled rectosigmoid colon without
diverticulitis.  There are scattered diverticula present.  No free
or loculated pelvic fluid collections.  The appendix is normal.
The no significant pelvic lymphadenopathy.  Degenerative changes in
the lumbar spine.  Postoperative changes at L4-5 with slight
anterior subluxation of L4 on L5.  This appears stable since
previous spine localization images from 11/30/2012.
IMPRESSION: Mild gallbladder distension without inflammatory change.  No
gallstones are demonstrated.  Mild intrahepatic and moderate
extrahepatic bile duct dilatation.  No stone or obstructing masses
demonstrated.  Prominence of the pancreatic duct to the uncinate
process without main duct dilatation suggest possible pancreas
divisum.

## 2014-11-11 DIAGNOSIS — M4726 Other spondylosis with radiculopathy, lumbar region: Secondary | ICD-10-CM | POA: Diagnosis not present

## 2014-11-11 DIAGNOSIS — M5416 Radiculopathy, lumbar region: Secondary | ICD-10-CM | POA: Diagnosis not present

## 2014-11-11 DIAGNOSIS — M1611 Unilateral primary osteoarthritis, right hip: Secondary | ICD-10-CM | POA: Diagnosis not present

## 2014-11-11 DIAGNOSIS — M5136 Other intervertebral disc degeneration, lumbar region: Secondary | ICD-10-CM | POA: Diagnosis not present

## 2014-12-26 DIAGNOSIS — M5416 Radiculopathy, lumbar region: Secondary | ICD-10-CM | POA: Diagnosis not present

## 2014-12-26 DIAGNOSIS — M160 Bilateral primary osteoarthritis of hip: Secondary | ICD-10-CM | POA: Diagnosis not present

## 2014-12-26 DIAGNOSIS — M4726 Other spondylosis with radiculopathy, lumbar region: Secondary | ICD-10-CM | POA: Diagnosis not present

## 2014-12-26 DIAGNOSIS — M5136 Other intervertebral disc degeneration, lumbar region: Secondary | ICD-10-CM | POA: Diagnosis not present

## 2014-12-28 ENCOUNTER — Encounter: Payer: Self-pay | Admitting: Neurology

## 2015-01-29 ENCOUNTER — Encounter: Payer: Self-pay | Admitting: Neurology

## 2015-01-29 ENCOUNTER — Ambulatory Visit (INDEPENDENT_AMBULATORY_CARE_PROVIDER_SITE_OTHER): Payer: Medicare Other | Admitting: Neurology

## 2015-01-29 VITALS — BP 138/82 | HR 100 | Ht 59.0 in | Wt 209.0 lb

## 2015-01-29 DIAGNOSIS — G250 Essential tremor: Secondary | ICD-10-CM

## 2015-01-29 DIAGNOSIS — G4733 Obstructive sleep apnea (adult) (pediatric): Secondary | ICD-10-CM | POA: Diagnosis not present

## 2015-01-29 DIAGNOSIS — F4323 Adjustment disorder with mixed anxiety and depressed mood: Secondary | ICD-10-CM

## 2015-01-29 NOTE — Progress Notes (Signed)
[name] was seen today in the movement disorders clinic for neurologic consultation at the request of [name], MD.  The consultation is for the evaluation of tremor.  The records that were made available to me were reviewed.  Pt is R hand dominant.  Pt reports tremor for 20-30 years, but was mild when it started.  It started in both hands.  Her father had the same thing, as did her sister and her son now has the same thing.  It didn't really start to bother her until 5-6 years ago.  She started on primidone at that time, 50 mg and it helped.  It was increased to 100 mg and it helped as well.  No SE with the medication.  Over the last few years, tremor has continued to pick up, and she is noticing it in the legs now as well.  She notices that anxiety/stress will increase the tremor.   In January, the patient ran out of her primidone for 4-5 days and she really noted a deterioration in the tremor.  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  She does not necessarily doze unexpectedly, but is very sleepy during the day.  She has an appointment with [name] to discuss this.   07/29/14 update:  Last visit, I increased the patients primidone to 50 mg in the AM and she is on 100 mg at night.  She is doing better in terms of tremor.  She has good days and bad days and she knows that stress makes it worse.  She did see [name] since last visit and I reviewed those notes.  She had a PSG.  She had an AHI of 60.6 but this was all in supine position and AHI in lateral position was 0.0 (but I was unsure of how much time, if any, was spent in the lateral position).  CPAP at 6 was recommended but she admits that she "hasn't given in to that" and wants to talk with Dr. [name].    01/29/15 update:  Pt returns today.  She had called and wanted to try to increase the primidone to 100 mg bid but it made her too sleepy.  She is therefore just on 2 at night.  She generally is doing  okay, but her husband is physically not well and that stress will increase tremor.  Husband with dementia and she is caregiving.  She is seeking out advice from psychologist.  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  The records that were made available to me were reviewed.  Less sleepy during the day with CPAP.    ALLERGIES:   Allergies  Allergen Reactions  . Codeine Nausea And Vomiting  . Morphine And Related Nausea And Vomiting    CURRENT MEDICATIONS:  Current Outpatient Prescriptions on File Prior to Visit  Medication Sig Dispense Refill  . amLODipine (NORVASC) 5 MG tablet Take 5 mg by mouth every morning.     . cetirizine (ZYRTEC) 10 MG tablet Take 10 mg by mouth daily.    . Cholecalciferol (VITAMIN D-3) 1000 UNITS CAPS Take by mouth daily.    . DULoxetine (CYMBALTA) 30 MG capsule Take 30 mg by mouth daily.    . hydrochlorothiazide (MICROZIDE) 12.5 MG capsule Take 12.5 mg by mouth daily.    . meloxicam (MOBIC) 15 MG tablet Take 15 mg by mouth daily.    Marland Kitchen omeprazole-sodium bicarbonate (ZEGERID) 40-1100 MG  per capsule Take 1 capsule by mouth daily before breakfast.    . primidone (MYSOLINE) 50 MG tablet Take 2 tablets (100 mg total) by mouth 2 (two) times daily. (Patient taking differently: Take 100 mg by mouth at bedtime. ) 360 tablet 3  . traMADol (ULTRAM) 50 MG tablet Take 50 mg by mouth daily.    . zoledronic acid (RECLAST) 5 MG/100ML SOLN Inject 5 mg into the vein once. A year in the fall     No current facility-administered medications on file prior to visit.    PAST MEDICAL HISTORY:   Past Medical History  Diagnosis Date  . Osteoporosis   . Hypertension   . GERD (gastroesophageal reflux disease)   . 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 Gallstones   . Wears glasses     PAST SURGICAL HISTORY:   Past Surgical History  Procedure Laterality Date  . Bladder repair  [phone]  . Carpal tunnel release  1992    right arm  . Rotator cuff repair  2004    two on right, one on  left  . 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 Trigger finger release      right hand  . Lumbar laminectomy/decompression microdiscectomy  11/30/2012    Procedure: LUMBAR LAMINECTOMY/DECOMPRESSION MICRODISCECTOMY 2 LEVELS;  Surgeon: Magnus Sinning, MD;  Location: [facility];  Service: Orthopedics;  Laterality: N/A;  DECOMPRESSION LAMINECTOMY L4-L5, L5-S1 CENTRAL   . Eus N/A 03/09/2013    Procedure: UPPER ENDOSCOPIC ULTRASOUND (EUS) LINEAR;  Surgeon: Beryle Beams, MD;  Location: [facility];  Service: Endoscopy;  Laterality: N/A;  . Abdominal hysterectomy  1983  . Cholecystectomy N/A 03/29/2013    Procedure: LAPAROSCOPIC CHOLECYSTECTOMY WITH INTRAOPERATIVE CHOLANGIOGRAM;  Surgeon: Haywood Lasso, MD;  Location: [facility];  Service: General;  Laterality: N/A;    SOCIAL HISTORY:   History   Social History  . Marital Status: Married    Spouse Name: Richard  . Number of Children: 2  . Years of Education: 19   Occupational History  . interior designer     partially retired   Social History Main Topics  . Smoking status: Former Smoker -- 0.50 packs/day    Types: Cigarettes    Quit date: 07/11/1970  . Smokeless tobacco: Never Used  . Alcohol Use: Yes     Comment: wine occasionally  . Drug Use: No  . Sexual Activity: Not Currently   Other Topics Concern  . Not on file   Social History Narrative   Patient is right handed, resides with spouse in a home.    FAMILY HISTORY:   Family Status  Relation Status Death Age  . Mother Deceased 38    sarcoma  . Father Deceased     complications from broken hip, ET  . Sister Deceased     blood clot, ET  . Son Alive     healthy, [place]  . Daughter Alive     healthy    ROS:  A complete 10 system review of systems was obtained and was unremarkable apart from what is mentioned above.  PHYSICAL EXAMINATION:    VITALS:   Filed Vitals:   01/29/15 1009  BP: 138/82  Pulse: 100  Height: 4\' 11"  (1.499  m)  Weight: 209 lb (94.802 kg)    GEN:  The patient appears stated age and is in NAD. HEENT:  Normocephalic, atraumatic.  The mucous  membranes are moist. The superficial temporal arteries are without ropiness or tenderness. CV:  RRR Lungs:  CTAB Neck/HEME:  There are no carotid bruits bilaterally.  Neurological examination:  Orientation: The patient is alert and oriented x3.  Cranial nerves: There is good facial symmetry. Pupils are equal round and reactive to light bilaterally. Fundoscopic exam reveals clear margins bilaterally. Extraocular muscles are intact. The visual fields are full to confrontational testing. The speech is fluent and clear.  I heard no laryngeal dystonia/spasmodic dysphonia. Soft palate rises symmetrically and there is no tongue deviation. Hearing is intact to conversational tone. Sensation: Sensation is intact to light and pinprick throughout (facial, trunk, extremities). Vibration is intact at the bilateral big toe. There is no extinction with double simultaneous stimulation. There is no sensory dermatomal level identified. Motor: Strength is 5/5 in the bilateral upper and lower extremities.   Shoulder shrug is equal and symmetric.  There is no pronator drift.   Movement examination: Tone: There is normal tone in the bilateral upper extremities.  The tone in the lower extremities is normal.  Abnormal movements: There is no tremor at rest.  There is tremor of the outstretched hands that increases with intention bilaterally.  She has some difficulty drawing Archimedes spirals bilaterally.  She does spill water with the right hand when pouring from one glass to another.  Coordination:  There is no significant decremation with RAM's, with any form of rapid alternating movement including alternating supination and pronation of the forearm, hand opening and closing, finger taps, heel taps and toe taps. Gait and Station: The patient has no significan difficulty arising out of  a deep-seated chair without the use of the hands. The patient's stride length is good.    Labs:  I reviewed the labs from her primary care physician, which were just done on 04/11/2014.  AST was 17, ALT 14 and alkaline phosphatase was normal at 67.  Her CBC was unremarkable.  BUN was 18 and creatinine 0.7.  Her TSH was normal at 1.20.  Hemoglobin A1c was normal at 5.7.  ASSESSMENT/PLAN:  1.  Essential tremor.  -I see no evidence of a neurodegenerative process,  and think  that this just represented a progression of the essential tremor.  She had difficulty with daytime hypersomnolence with daytime dosages of primidone.  She is on 100 mg at night.  Generally, she is doing well with this dose.  I told her that if needed, we could try 150 mg at night.  She stated that she may try this, but for now she will stay at 100 mg.  -ET pt education provided. 2.  Hx AVM repair with coiling  -She does have an upgoing toe on the right, which I think is likely just a result of her history of AVM, which she reports was on the left.  I see nothing else focal or lateralizing on her examination today, so decided to hold off on any further neuroimaging.   3.  OSAS  -On CPAP faithfully and doing well  4.  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  -Long discussion with her about this today.  She is already seeing psychology.  She is not suicidal or homicidal.  Talked about coping mechanisms.  Greater than 50% of the 25 minute visit spent in counseling. 4.  I will plan on seeing her back in 6 months, sooner should new neurologic issues arise.

## 2015-04-07 DIAGNOSIS — H35363 Drusen (degenerative) of macula, bilateral: Secondary | ICD-10-CM | POA: Insufficient documentation

## 2015-04-07 DIAGNOSIS — H02103 Unspecified ectropion of right eye, unspecified eyelid: Secondary | ICD-10-CM | POA: Insufficient documentation

## 2015-04-07 DIAGNOSIS — H43393 Other vitreous opacities, bilateral: Secondary | ICD-10-CM | POA: Insufficient documentation

## 2015-04-08 DIAGNOSIS — H04223 Epiphora due to insufficient drainage, bilateral lacrimal glands: Secondary | ICD-10-CM | POA: Diagnosis not present

## 2015-04-08 DIAGNOSIS — H02832 Dermatochalasis of right lower eyelid: Secondary | ICD-10-CM | POA: Diagnosis not present

## 2015-04-08 DIAGNOSIS — E559 Vitamin D deficiency, unspecified: Secondary | ICD-10-CM | POA: Diagnosis not present

## 2015-04-08 DIAGNOSIS — Z885 Allergy status to narcotic agent status: Secondary | ICD-10-CM | POA: Diagnosis not present

## 2015-04-08 DIAGNOSIS — H02835 Dermatochalasis of left lower eyelid: Secondary | ICD-10-CM | POA: Diagnosis not present

## 2015-04-08 DIAGNOSIS — H35363 Drusen (degenerative) of macula, bilateral: Secondary | ICD-10-CM | POA: Diagnosis not present

## 2015-04-08 DIAGNOSIS — H11823 Conjunctivochalasis, bilateral: Secondary | ICD-10-CM | POA: Diagnosis not present

## 2015-04-08 DIAGNOSIS — H02839 Dermatochalasis of unspecified eye, unspecified eyelid: Secondary | ICD-10-CM | POA: Diagnosis not present

## 2015-04-08 DIAGNOSIS — H02102 Unspecified ectropion of right lower eyelid: Secondary | ICD-10-CM | POA: Diagnosis not present

## 2015-04-08 DIAGNOSIS — H43393 Other vitreous opacities, bilateral: Secondary | ICD-10-CM | POA: Diagnosis not present

## 2015-04-08 DIAGNOSIS — I10 Essential (primary) hypertension: Secondary | ICD-10-CM | POA: Diagnosis not present

## 2015-04-08 DIAGNOSIS — F329 Major depressive disorder, single episode, unspecified: Secondary | ICD-10-CM | POA: Diagnosis not present

## 2015-04-08 DIAGNOSIS — H02831 Dermatochalasis of right upper eyelid: Secondary | ICD-10-CM | POA: Diagnosis not present

## 2015-04-08 DIAGNOSIS — H02105 Unspecified ectropion of left lower eyelid: Secondary | ICD-10-CM | POA: Diagnosis not present

## 2015-04-08 DIAGNOSIS — H11153 Pinguecula, bilateral: Secondary | ICD-10-CM | POA: Diagnosis not present

## 2015-04-08 DIAGNOSIS — Z87891 Personal history of nicotine dependence: Secondary | ICD-10-CM | POA: Diagnosis not present

## 2015-04-08 DIAGNOSIS — H02109 Unspecified ectropion of unspecified eye, unspecified eyelid: Secondary | ICD-10-CM | POA: Diagnosis not present

## 2015-04-08 DIAGNOSIS — H5203 Hypermetropia, bilateral: Secondary | ICD-10-CM | POA: Diagnosis not present

## 2015-04-08 DIAGNOSIS — H02834 Dermatochalasis of left upper eyelid: Secondary | ICD-10-CM | POA: Diagnosis not present

## 2015-04-08 DIAGNOSIS — K219 Gastro-esophageal reflux disease without esophagitis: Secondary | ICD-10-CM | POA: Diagnosis not present

## 2015-04-14 ENCOUNTER — Ambulatory Visit (INDEPENDENT_AMBULATORY_CARE_PROVIDER_SITE_OTHER): Payer: Medicare Other | Admitting: Neurology

## 2015-04-14 ENCOUNTER — Encounter: Payer: Self-pay | Admitting: Neurology

## 2015-04-14 VITALS — BP 132/81 | HR 90 | Resp 16 | Ht 61.0 in | Wt 210.0 lb

## 2015-04-14 DIAGNOSIS — M859 Disorder of bone density and structure, unspecified: Secondary | ICD-10-CM | POA: Diagnosis not present

## 2015-04-14 DIAGNOSIS — Z9989 Dependence on other enabling machines and devices: Principal | ICD-10-CM

## 2015-04-14 DIAGNOSIS — Z0000 Encounter for general adult medical examination without abnormal findings: Secondary | ICD-10-CM | POA: Diagnosis not present

## 2015-04-14 DIAGNOSIS — R7301 Impaired fasting glucose: Secondary | ICD-10-CM | POA: Diagnosis not present

## 2015-04-14 DIAGNOSIS — G250 Essential tremor: Secondary | ICD-10-CM

## 2015-04-14 DIAGNOSIS — G4733 Obstructive sleep apnea (adult) (pediatric): Secondary | ICD-10-CM | POA: Diagnosis not present

## 2015-04-14 DIAGNOSIS — I10 Essential (primary) hypertension: Secondary | ICD-10-CM | POA: Diagnosis not present

## 2015-04-14 DIAGNOSIS — R600 Localized edema: Secondary | ICD-10-CM

## 2015-04-14 DIAGNOSIS — R351 Nocturia: Secondary | ICD-10-CM

## 2015-04-14 NOTE — Patient Instructions (Signed)
Please continue using your CPAP regularly. While your insurance requires that you use CPAP at least 4 hours each night on 70% of the nights, I recommend, that you not skip any nights and use it throughout the night if you can. Getting used to CPAP and staying with the treatment long term does take time and patience and discipline. Untreated obstructive sleep apnea when it is moderate to severe can have an adverse impact on cardiovascular health and raise her risk for heart disease, arrhythmias, hypertension, congestive heart failure, stroke and diabetes. Untreated obstructive sleep apnea causes sleep disruption, nonrestorative sleep, and sleep deprivation. This can have an impact on your day to day functioning and cause daytime sleepiness and impairment of cognitive function, memory loss, mood disturbance, and problems focussing. Using CPAP regularly can improve these symptoms.  Keep up the good work with your CPAP usage! I will see you back in a year for sleep apnea check up.

## 2015-04-14 NOTE — Progress Notes (Signed)
Subjective:    Patient ID: [name] is a 74 y.o. female.  HPI     Interim history:  [name] is a very friendly 74 year old right-handed woman with an underlying medical history of hypertension, reflux disease, recurrent headaches, chronic back pain (s/p back surgery, s/p epidural injection in 3/15), osteopenia with vitamin D deficiency, chronic UTI on prophylactic ABx, AVF, status post coiling in 2004, basal cell cancer and essential tremor (followed by Dr. Carles Collet), who presents for follow up consultation of her OSA, treated with CPAP. She is unaccompanied today. I last saw her on 10/10/2014, at which time she reported doing fairly well, with some residual daytime tiredness. She reported some stress. She was supposed to meet with a therapist soon. She has been on Cymbalta. She reported improved nocturia, and her sleep was more consolidated. She did have an increase in her Mysoline dose right around the same time she started CPAP therapy. She was fully compliant with treatment and I encouraged her to continue with treatment. We talked about the sleep test results at the time.  Today, 04/14/2015: I reviewed her CPAP compliance data from 03/12/2015 through 04/10/2015 which is a total of 30 days during which time she used her machine every night with percent used days greater than 4 hours at 100%, indicating superb compliance, with an average usage of 8 hours and 12 minutes, pressure of 6 cm, residual AHI low at 1.6 per hour and leak low, with the 95th percentile of leak at 4 L/m  Today, 04/14/2015: She reports, doing well as far as the CPAP goes. She has ongoing good results, as far as her nocturia goes, which is down to at the most 1/night, as opposed to 2-3 times per night on average before treatment with CPAP. She feels her sleep is relatively well consolidated however she also has to tend to her husband sometimes at night. She is worried about his health problems and his cognitive decline. He  is 76 years old. She herself has 2 biological children from before and he has 8 children from before. They do not have children together. They have been married about 25 years. Her tremor fluctuates on a day-to-day basis but generally speaking response to Mysoline. She could not increase the dose further. She saw Dr. Carles Collet in February 2016 and I reviewed the office note. She was encouraged to increase the Mysoline to up to 4 pills a day but she feels that she gets too sleepy from it. Therefore, at this time, she takes 2 pills at night only. She endorses stress. She sees a therapist. This helps. She is still struggling to try to lose weight.  Previously:  I first met her on 04/29/14, at the request of her PCP, at which time she reported excessive daytime somnolence, nocturia, and snoring, (per husband). I suggested she return for sleep study. She had a split-night sleep study on 06/05/2014 and I went over her test results with her in detail today. Her baseline sleep efficiency was 68.6% with a latency to sleep of 26 minutes and wake after sleep onset of 16.5 minutes with mild sleep fragmentation noted. She had an elevated arousal index. She had an elevated percentage of light stage sleep, absence of deep sleep, and absence of REM sleep. She had no significant EEG changes and had rare PVCs and PVCs. She had a total AHI of 60.6 per hour. Baseline oxygen saturation was 90% with a nadir of 84%. She was titrated on CPAP. Sleep efficiency  was improved during the treatment portion of the study. Arousal index was markedly improved. She achieved deep sleep and REM sleep during the treatment portion of the study. Average oxygen saturation was 91% with a nadir of 84%. Time below 88% saturation during the treatment portion of the study was 3 minutes and 35 seconds. She was titrated from 4-6 cm with a reduction of her AHI to 0.9 events at the final pressure. Supine REM sleep was achieved. Based on her sleep study results I  ordered CPAP treatment for her.  I reviewed her compliance data from 08/20/2014 through 09/18/2014 which is a total of 30 days during which time she used her CPAP every night with percent used days greater than 4 hours at 100%, indicating superb compliance, average usage of 8 hours and 10 minutes, pressure at 6 cm with EPR of 3, residual AHI low at 1.9 per hour and leak was low at 4.3 L/m for the 95th percentile.  I reviewed her compliance data from 09/10/2014 through 10/09/2014 which is a total of 30 days during which time she was 100% compliant. Average usage of 8 hours and 17 minutes, residual AHI at 1.8 per hour, leak low. Pressure at 6 cm with EPR of 3.   She has been on Effexor XR 225 mg daily and tapered off of it over the course of a week and was started on Cymbalta 30 mg. She is retired as an Futures trader, but still works some. She has had some additional stressors. She endorses EDS and her ESS is 10/24. She has not fallen asleep driving. Her husband is 41 years old, and recently had eye surgery. She denies gasping for air and is not sure that she has witnessed apneas. She does not have morning headaches, but has had some headaches since she has been on Mobic. She has nocturia x 2, which is better since the ABx. She drinks caffeine, but has cut back on her sodas to none after 2 PM. She likes Coke zero. She does not take a scheduled nap. She denies RLS symptoms, and is not sure if she kicks in her sleep. She goes to bed by 10 PM and likes to read on her Melanee Spry, and she is asleep by 11 PM. She has taken the occasional tylenol PM.   There is no report of nighttime reflux, now that she is on Zegerid. There Is family history of RLS in her mother and suspected OSA in her father, who also had a tremor.   She is not a very restless sleeper. She likes to sleep on her right side, but can sleep on her stomach and her back.   She denies cataplexy, sleep paralysis, hypnagogic or hypnopompic hallucinations,  or sleep attacks. She does not report any vivid dreams, nightmares, dream enactments, or parasomnias, such as sleep talking or sleep walking. The patient has not had a sleep study or a home sleep test. Her bedroom is usually dark and cool. There is a TV in the bedroom and usually it is not on at night.   Her Past Medical History Is Significant For: Past Medical History  Diagnosis Date  . Osteoporosis   . Hypertension   . GERD (gastroesophageal reflux disease)   . 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 Gallstones   . Wears glasses     Her Past Surgical History Is Significant For: Past Surgical History  Procedure Laterality Date  . Bladder repair  [phone]  .  Carpal tunnel release  1992    right arm  . Rotator cuff repair  2004    two on right, one on left  . Arterial vascular fistula  2004    coiled at [facility]  . Trigger finger release      right hand  . Lumbar laminectomy/decompression microdiscectomy  11/30/2012    Procedure: LUMBAR LAMINECTOMY/DECOMPRESSION MICRODISCECTOMY 2 LEVELS;  Surgeon: Magnus Sinning, MD;  Location: [facility];  Service: Orthopedics;  Laterality: N/A;  DECOMPRESSION LAMINECTOMY L4-L5, L5-S1 CENTRAL   . Eus N/A 03/09/2013    Procedure: UPPER ENDOSCOPIC ULTRASOUND (EUS) LINEAR;  Surgeon: Beryle Beams, MD;  Location: [facility];  Service: Endoscopy;  Laterality: N/A;  . Abdominal hysterectomy  1983  . Cholecystectomy N/A 03/29/2013    Procedure: LAPAROSCOPIC CHOLECYSTECTOMY WITH INTRAOPERATIVE CHOLANGIOGRAM;  Surgeon: Haywood Lasso, MD;  Location: [facility];  Service: General;  Laterality: N/A;    Her Family History Is Significant For: Family History  Problem Relation Age of Onset  . Other Mother     sarcoma  . Other Father     broken hip    Her Social History Is Significant For: History   Social History  . Marital Status: Married    Spouse Name: Richard  . Number of Children: 2  . Years of Education: 19    Occupational History  . interior designer     partially retired   Social History Main Topics  . Smoking status: Former Smoker -- 0.50 packs/day    Types: Cigarettes    Quit date: 07/11/1970  . Smokeless tobacco: Never Used  . Alcohol Use: 0.0 oz/week    0 Standard drinks or equivalent per week     Comment: wine occasionally  . Drug Use: No  . Sexual Activity: Not Currently   Other Topics Concern  . None   Social History Narrative   Patient is right handed, resides with spouse in a home.   1 cup of coffee a day     Her Allergies Are:  Allergies  Allergen Reactions  . Codeine Nausea And Vomiting  . Morphine And Related Nausea And Vomiting  :   Her Current Medications Are:   See chart  Review of Systems:  Out of a complete 14 point review of systems, all are reviewed and negative with the exception of these symptoms as listed below:   Review of Systems  All other systems reviewed and are negative.   Objective:  Neurologic Exam  Physical Exam Physical Examination:   Filed Vitals:   04/14/15 1059  BP: 132/81  Pulse: 90  Resp: 16    General Examination: The patient is a very pleasant 74 y.o. female in no acute distress. She appears well-developed and well-nourished and well groomed. She is overweight. She is tearful at times.  HEENT: Normocephalic, atraumatic, pupils are equal, round and reactive to light and accommodation. Funduscopic exam is normal with sharp disc margins noted. Extraocular tracking is good without limitation to gaze excursion or nystagmus noted. Normal smooth pursuit is noted. Hearing is grossly intact. Tympanic membranes are clear bilaterally. Face is symmetric with normal facial animation and normal facial sensation. 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 There is slight hypophonia. There is no lip, neck/head, jaw or voice tremor. Neck is supple with full range of passive and active motion. There are no  carotid bruits on auscultation. Oropharynx exam reveals: moderate mouth dryness, adequate dental hygiene and  moderate airway crowding, due to redundant soft palate and long but thin uvula. Mallampati is class II. Tongue protrudes centrally and palate elevates symmetrically. Tonsils are 1+ in size. She has a tiny overbite. Nasal inspection reveals no significant nasal mucosal bogginess or redness and no septal deviation.  Chest: Clear to auscultation without wheezing, rhonchi or crackles noted.  Heart: S1+S2+0, regular and normal without murmurs, rubs or gallops noted.   Abdomen: Soft, non-tender and non-distended with normal bowel sounds appreciated on auscultation.  Extremities: There is 1+ edema in the distal lower extremities bilaterally, left more than right, and she wears compression stockings bilaterally up to the knees. Pedal pulses are intact.  Skin: Warm and dry without trophic changes noted. There are no varicose veins.  Musculoskeletal: exam reveals no obvious joint deformities, tenderness or joint swelling or erythema.   Neurologically:  Mental status: The patient is awake, alert and oriented in all 4 spheres. Her immediate and remote memory, attention, language skills and fund of knowledge are appropriate. There is no evidence of aphasia, agnosia, apraxia or anomia. Speech is clear with normal prosody and enunciation. Thought process is linear. Mood is normal and affect is normal.  Cranial nerves II - XII are as described above under HEENT exam. In addition: shoulder shrug is normal with equal shoulder height noted. Motor exam: Normal bulk, strength and tone is noted. There is no drift, rest tremor or rebound. She has a very mild b/l UE postural and action tremor, left more than right. Romberg is negative. Reflexes are 2+ in the upper extremities and 1+ in the lower extremities. Babinski: Toes are flexor bilaterally. Fine motor skills and coordination: intact.  Cerebellar testing: No  dysmetria or intention tremor on finger to nose testing. Heel to shin is unremarkable bilaterally. There is no truncal or gait ataxia. Sensory exam: intact to light touch.   Gait, station and balance: She stands easily. No veering to one side is noted. No leaning to one side is noted. Posture is age-appropriate and stance is narrow based. Gait shows normal stride length and normal pace. No problems turning are noted. She turns en bloc. Tandem walk is slightly difficult for her, but improved from last time.          Assessment and Plan:   In summary, [name] is a very pleasant 74 year old female with an underlying medical history of hypertension, reflux disease, recurrent headaches, chronic back pain (s/p back surgery, s/p epidural injection in 3/15), osteopenia with vitamin D deficiency, chronic UTI on prophylactic ABx, AVF, status post coiling in 2004, basal cell cancer and essential tremor, who presents for follow-up consultation of her severe obstructive sleep apnea, on treatment with CPAP therapy since approximately 08/20/2014. She is fully compliant. Today, we talked about her most recent compliance data. I congratulated her on her full compliance. She feels improved in her nocturia, sleep consolidation and to some degree her daytime somnolence. She is trying to lose weight. She continues to struggle with stress and health concerns, particularly with respect her husband's situation. Her tremor is fairly stable and responsive to Mysoline. Her physical exam is stable. She is going to see her primary care physician soon for a checkup. She is pleased overall with how she is doing with CPAP and committed to treatment. I explained the importance of being compliant with PAP treatment, not only for insurance purposes but primarily to improve Her symptoms, and for the patient's long term health benefit, including to reduce Her cardiovascular  risks. I would like to see her back in a year for routine follow  up of her OSA, well treated with CPAP and full compliance and ongoing benefit.   I answered all her questions today and the patient was in agreement. I encouraged her to call with any interim questions, concerns, problems or updates.  I spent 25 minutes in total face-to-face time with the patient, more than 50% of which was spent in counseling and coordination of care, reviewing test results, reviewing medication and discussing or reviewing the diagnosis of OSA, its prognosis and treatment options.

## 2015-04-21 DIAGNOSIS — N3281 Overactive bladder: Secondary | ICD-10-CM | POA: Diagnosis not present

## 2015-04-21 DIAGNOSIS — Z0000 Encounter for general adult medical examination without abnormal findings: Secondary | ICD-10-CM | POA: Diagnosis not present

## 2015-04-21 DIAGNOSIS — R7301 Impaired fasting glucose: Secondary | ICD-10-CM | POA: Diagnosis not present

## 2015-04-21 DIAGNOSIS — Z6841 Body Mass Index (BMI) 40.0 and over, adult: Secondary | ICD-10-CM | POA: Diagnosis not present

## 2015-04-21 DIAGNOSIS — G4733 Obstructive sleep apnea (adult) (pediatric): Secondary | ICD-10-CM | POA: Diagnosis not present

## 2015-04-21 DIAGNOSIS — Z1389 Encounter for screening for other disorder: Secondary | ICD-10-CM | POA: Diagnosis not present

## 2015-04-21 DIAGNOSIS — M859 Disorder of bone density and structure, unspecified: Secondary | ICD-10-CM | POA: Diagnosis not present

## 2015-04-21 DIAGNOSIS — I10 Essential (primary) hypertension: Secondary | ICD-10-CM | POA: Diagnosis not present

## 2015-04-21 DIAGNOSIS — G250 Essential tremor: Secondary | ICD-10-CM | POA: Diagnosis not present

## 2015-04-21 DIAGNOSIS — F329 Major depressive disorder, single episode, unspecified: Secondary | ICD-10-CM | POA: Diagnosis not present

## 2015-05-27 DIAGNOSIS — F329 Major depressive disorder, single episode, unspecified: Secondary | ICD-10-CM | POA: Diagnosis not present

## 2015-05-27 DIAGNOSIS — M859 Disorder of bone density and structure, unspecified: Secondary | ICD-10-CM | POA: Diagnosis not present

## 2015-05-27 DIAGNOSIS — Z6841 Body Mass Index (BMI) 40.0 and over, adult: Secondary | ICD-10-CM | POA: Diagnosis not present

## 2015-05-27 DIAGNOSIS — I10 Essential (primary) hypertension: Secondary | ICD-10-CM | POA: Diagnosis not present

## 2015-07-03 DIAGNOSIS — L814 Other melanin hyperpigmentation: Secondary | ICD-10-CM | POA: Diagnosis not present

## 2015-07-03 DIAGNOSIS — L304 Erythema intertrigo: Secondary | ICD-10-CM | POA: Diagnosis not present

## 2015-07-03 DIAGNOSIS — D225 Melanocytic nevi of trunk: Secondary | ICD-10-CM | POA: Diagnosis not present

## 2015-07-03 DIAGNOSIS — L821 Other seborrheic keratosis: Secondary | ICD-10-CM | POA: Diagnosis not present

## 2015-07-03 DIAGNOSIS — D2272 Melanocytic nevi of left lower limb, including hip: Secondary | ICD-10-CM | POA: Diagnosis not present

## 2015-07-03 DIAGNOSIS — Z85828 Personal history of other malignant neoplasm of skin: Secondary | ICD-10-CM | POA: Diagnosis not present

## 2015-07-03 DIAGNOSIS — L817 Pigmented purpuric dermatosis: Secondary | ICD-10-CM | POA: Diagnosis not present

## 2015-07-03 DIAGNOSIS — L820 Inflamed seborrheic keratosis: Secondary | ICD-10-CM | POA: Diagnosis not present

## 2015-07-03 DIAGNOSIS — D1801 Hemangioma of skin and subcutaneous tissue: Secondary | ICD-10-CM | POA: Diagnosis not present

## 2015-07-30 ENCOUNTER — Ambulatory Visit: Payer: Medicare Other | Admitting: Neurology

## 2015-07-30 DIAGNOSIS — Z6841 Body Mass Index (BMI) 40.0 and over, adult: Secondary | ICD-10-CM | POA: Diagnosis not present

## 2015-07-30 DIAGNOSIS — J029 Acute pharyngitis, unspecified: Secondary | ICD-10-CM | POA: Diagnosis not present

## 2015-07-30 DIAGNOSIS — I10 Essential (primary) hypertension: Secondary | ICD-10-CM | POA: Diagnosis not present

## 2015-08-01 ENCOUNTER — Ambulatory Visit (INDEPENDENT_AMBULATORY_CARE_PROVIDER_SITE_OTHER): Payer: Medicare Other | Admitting: Neurology

## 2015-08-01 ENCOUNTER — Encounter: Payer: Self-pay | Admitting: Neurology

## 2015-08-01 VITALS — BP 122/88 | HR 96 | Ht 64.0 in | Wt 214.0 lb

## 2015-08-01 DIAGNOSIS — F4323 Adjustment disorder with mixed anxiety and depressed mood: Secondary | ICD-10-CM | POA: Diagnosis not present

## 2015-08-01 DIAGNOSIS — I10 Essential (primary) hypertension: Secondary | ICD-10-CM | POA: Diagnosis not present

## 2015-08-01 DIAGNOSIS — G250 Essential tremor: Secondary | ICD-10-CM | POA: Diagnosis not present

## 2015-08-01 MED ORDER — PROPRANOLOL HCL ER 80 MG PO CP24: 80.0000 mg | ORAL_CAPSULE | Freq: Every day | ORAL | Status: DC

## 2015-08-01 NOTE — Progress Notes (Signed)
[name] was seen today in the movement disorders clinic for neurologic consultation at the request of [name], MD.  The consultation is for the evaluation of tremor.  The records that were made available to me were reviewed.  Pt is R hand dominant.  Pt reports tremor for 20-30 years, but was mild when it started.  It started in both hands.  Her father had the same thing, as did her sister and her son now has the same thing.  It didn't really start to bother her until 5-6 years ago.  She started on primidone at that time, 50 mg and it helped.  It was increased to 100 mg and it helped as well.  No SE with the medication.  Over the last few years, tremor has continued to pick up, and she is noticing it in the legs now as well.  She notices that anxiety/stress will increase the tremor.   In January, the patient ran out of her primidone for 4-5 days and she really noted a deterioration in the tremor.  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  She does not necessarily doze unexpectedly, but is very sleepy during the day.  She has an appointment with [name] to discuss this.   07/29/14 update:  Last visit, I increased the patients primidone to 50 mg in the AM and she is on 100 mg at night.  She is doing better in terms of tremor.  She has good days and bad days and she knows that stress makes it worse.  She did see [name] since last visit and I reviewed those notes.  She had a PSG.  She had an AHI of 60.6 but this was all in supine position and AHI in lateral position was 0.0 (but I was unsure of how much time, if any, was spent in the lateral position).  CPAP at 6 was recommended but she admits that she "hasn't given in to that" and wants to talk with Dr. [name].    01/29/15 update:  Pt returns today.  She had called and wanted to try to increase the primidone to 100 mg bid but it made her too sleepy.  She is therefore just on 2 at night.  She generally is doing  okay, but her husband is physically not well and that stress will increase tremor.  Husband with dementia and she is caregiving.  She is seeking out advice from psychologist.  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  The records that were made available to me were reviewed.  Less sleepy during the day with CPAP.    08/01/15 update:  The patient returns today for follow-up.  I have reviewed records since her last visit.  She has a history of essential tremor.  She is currently on primidone, 100 mg at night.  Daytime dosages have caused sleepiness.  She did follow-up with [name] on 04/14/2015 in regards to her sleep apnea.  She has been very compliant with this.  She reports that she is doing worse.  She states that she can hardly get food in the mouth at times.  She has no asthma.  She states that she has tried to relieve stress (primary caregiver for her husband who is ill) through painting and when she tried to sign the painting today, she couldn't do it.  She has never been on a beta blocker.  She does state  that she has been ill recently and is on an aquatic's, but she is not on any prednisone.  ALLERGIES:   Allergies  Allergen Reactions  . Codeine Nausea And Vomiting  . Morphine And Related Nausea And Vomiting    CURRENT MEDICATIONS:  Current Outpatient Prescriptions on File Prior to Visit  Medication Sig Dispense Refill  . cetirizine (ZYRTEC) 10 MG tablet Take 10 mg by mouth daily.    . Cholecalciferol (VITAMIN D-3) 1000 UNITS CAPS Take by mouth daily.    . diphenhydramine-acetaminophen (TYLENOL PM) 25-500 MG TABS Take 1 tablet by mouth at bedtime as needed.    . hydrochlorothiazide (HYDRODIURIL) 25 MG tablet     . meloxicam (MOBIC) 15 MG tablet Take 15 mg by mouth daily.    Marland Kitchen omeprazole-sodium bicarbonate (ZEGERID) 40-1100 MG per capsule Take 1 capsule by mouth daily before breakfast.    . primidone (MYSOLINE) 50 MG tablet Take 2 tablets (100 mg total) by mouth 2 (two) times daily. (Patient taking  differently: Take 100 mg by mouth at bedtime. ) 360 tablet 3  . Probiotic Product (PROBIOTIC DAILY PO) Take by mouth.    . traMADol (ULTRAM) 50 MG tablet Take 50 mg by mouth daily.    Marland Kitchen trimethoprim (TRIMPEX) 100 MG tablet     . zoledronic acid (RECLAST) 5 MG/100ML SOLN Inject 5 mg into the vein once. A year in the fall     No current facility-administered medications on file prior to visit.    PAST MEDICAL HISTORY:   Past Medical History  Diagnosis Date  . Osteoporosis   . Hypertension   . GERD (gastroesophageal reflux disease)   . 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 Gallstones   . Wears glasses     PAST SURGICAL HISTORY:   Past Surgical History  Procedure Laterality Date  . Bladder repair  [phone]  . Carpal tunnel release  1992    right arm  . Rotator cuff repair  2004    two on right, one on left  . 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 Trigger finger release      right hand  . Lumbar laminectomy/decompression microdiscectomy  11/30/2012    Procedure: LUMBAR LAMINECTOMY/DECOMPRESSION MICRODISCECTOMY 2 LEVELS;  Surgeon: Magnus Sinning, MD;  Location: [facility];  Service: Orthopedics;  Laterality: N/A;  DECOMPRESSION LAMINECTOMY L4-L5, L5-S1 CENTRAL   . Eus N/A 03/09/2013    Procedure: UPPER ENDOSCOPIC ULTRASOUND (EUS) LINEAR;  Surgeon: Beryle Beams, MD;  Location: [facility];  Service: Endoscopy;  Laterality: N/A;  . Abdominal hysterectomy  1983  . Cholecystectomy N/A 03/29/2013    Procedure: LAPAROSCOPIC CHOLECYSTECTOMY WITH INTRAOPERATIVE CHOLANGIOGRAM;  Surgeon: Haywood Lasso, MD;  Location: [place];  Service: General;  Laterality: N/A;    SOCIAL HISTORY:   Social History   Social History  . Marital Status: Married    Spouse Name: Richard  . Number of Children: 2  . Years of Education: 19   Occupational History  . interior designer     partially retired   Social History Main Topics  . Smoking status:  Former Smoker -- 0.50 packs/day    Types: Cigarettes    Quit date: 07/11/1970  . Smokeless tobacco: Never Used  . Alcohol Use: 0.0 oz/week    0 Standard drinks or equivalent per week     Comment: wine occasionally  . Drug Use: No  . Sexual  Activity: Not Currently   Other Topics Concern  . Not on file   Social History Narrative   Patient is right handed, resides with spouse in a home.   1 cup of coffee a day     FAMILY HISTORY:   Family Status  Relation Status Death Age  . Mother Deceased 63    sarcoma  . Father Deceased     complications from broken hip, ET  . Sister Deceased     blood clot, ET  . Son Alive     healthy, [place]  . Daughter Alive     healthy    ROS:  A complete 10 system review of systems was obtained and was unremarkable apart from what is mentioned above.  PHYSICAL EXAMINATION:    VITALS:   Filed Vitals:   08/01/15 1406  BP: 122/88  Pulse: 96  Height: 5\' 4"  (1.626 m)  Weight: 214 lb (97.07 kg)    GEN:  The patient appears stated age and is in NAD. HEENT:  Normocephalic, atraumatic.  The mucous membranes are moist. The superficial temporal arteries are without ropiness or tenderness. CV:  RRR Lungs:  CTAB Neck/HEME:  There are no carotid bruits bilaterally.  Neurological examination:  Orientation: The patient is alert and oriented x3.  Cranial nerves: There is good facial symmetry. Pupils are equal round and reactive to light bilaterally. Fundoscopic exam reveals clear margins bilaterally. Extraocular muscles are intact. The visual fields are full to confrontational testing. The speech is fluent and clear.  I heard no laryngeal dystonia/spasmodic dysphonia. Soft palate rises symmetrically and there is no tongue deviation. Hearing is intact to conversational tone. Sensation: Sensation is intact to light and pinprick throughout (facial, trunk, extremities). Vibration is intact at the bilateral big toe. There is no extinction with double simultaneous  stimulation. There is no sensory dermatomal level identified. Motor: Strength is 5/5 in the bilateral upper and lower extremities.   Shoulder shrug is equal and symmetric.  There is no pronator drift.   Movement examination: Tone: There is normal tone in the bilateral upper extremities.  The tone in the lower extremities is normal.  Abnormal movements: There is no tremor at rest.  There is tremor of the outstretched hands that increases with intention bilaterally.  Coordination:  There is no significant decremation with RAM's, with any form of rapid alternating movement including alternating supination and pronation of the forearm, hand opening and closing, finger taps, heel taps and toe taps. Gait and Station: The patient has no significant difficulty arising out of a deep-seated chair without the use of the hands. The patient's stride length is good.    Labs:  I reviewed the labs from her primary care physician, which were just done on 04/11/2014.  AST was 17, ALT 14 and alkaline phosphatase was normal at 67.  Her CBC was unremarkable.  BUN was 18 and creatinine 0.7.  Her TSH was normal at 1.20.  Hemoglobin A1c was normal at 5.7.  ASSESSMENT/PLAN:  1.  Essential tremor.  - She had difficulty with daytime hypersomnolence with daytime dosages of primidone.  She is on 100 mg at night.    -After a long and serious counseling session, we decided to try and add Inderal LA, 80 mg daily.  I asked her to check her blood pressure (reviewed her BP and pulse today and should be able to tolerate).  She will let me know she has any lightheadedness or near syncope.  Risks, benefits, side effects  and alternative therapies were discussed.  The opportunity to ask questions was given and they were answered to the best of my ability.  The patient expressed understanding and willingness to follow the outlined treatment protocols.  -ET pt education provided. 2.  Hx AVM repair with coiling  -She does have an upgoing  toe on the right, which I think is likely just a result of her history of AVM, which she reports was on the left.  I see nothing else focal or lateralizing on her examination today, so decided to hold off on any further neuroimaging.   3.  OSAS  -On CPAP faithfully and doing well  4.  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  -Long discussion with her about this today.  She is already seeing psychology.  Under more stress with caregiving for her husband who is ill.  She is not suicidal or homicidal.  Talked about coping mechanisms.  Greater than 50% of the 25 minute visit spent in counseling. 4.  I will plan on seeing her back in 4 months, sooner should new neurologic issues arise.

## 2015-08-11 ENCOUNTER — Emergency Department (HOSPITAL_BASED_OUTPATIENT_CLINIC_OR_DEPARTMENT_OTHER): Payer: Medicare Other

## 2015-08-11 ENCOUNTER — Encounter (HOSPITAL_BASED_OUTPATIENT_CLINIC_OR_DEPARTMENT_OTHER): Payer: Self-pay | Admitting: *Deleted

## 2015-08-11 ENCOUNTER — Emergency Department (HOSPITAL_BASED_OUTPATIENT_CLINIC_OR_DEPARTMENT_OTHER)
Admission: EM | Admit: 2015-08-11 | Discharge: 2015-08-12 | Disposition: A | Discharge status: Home / Self Care | Payer: Medicare Other | Attending: Emergency Medicine | Admitting: Emergency Medicine

## 2015-08-11 DIAGNOSIS — Z23 Encounter for immunization: Secondary | ICD-10-CM | POA: Diagnosis not present

## 2015-08-11 DIAGNOSIS — W108XXA Fall (on) (from) other stairs and steps, initial encounter: Secondary | ICD-10-CM | POA: Diagnosis not present

## 2015-08-11 DIAGNOSIS — Z79899 Other long term (current) drug therapy: Secondary | ICD-10-CM | POA: Insufficient documentation

## 2015-08-11 DIAGNOSIS — M810 Age-related osteoporosis without current pathological fracture: Secondary | ICD-10-CM | POA: Insufficient documentation

## 2015-08-11 DIAGNOSIS — S40021A Contusion of right upper arm, initial encounter: Secondary | ICD-10-CM | POA: Diagnosis not present

## 2015-08-11 DIAGNOSIS — S42401A Unspecified fracture of lower end of right humerus, initial encounter for closed fracture: Secondary | ICD-10-CM | POA: Diagnosis not present

## 2015-08-11 DIAGNOSIS — S5011XA Contusion of right forearm, initial encounter: Secondary | ICD-10-CM | POA: Diagnosis not present

## 2015-08-11 DIAGNOSIS — M79621 Pain in right upper arm: Secondary | ICD-10-CM | POA: Diagnosis not present

## 2015-08-11 DIAGNOSIS — Z87891 Personal history of nicotine dependence: Secondary | ICD-10-CM | POA: Diagnosis not present

## 2015-08-11 DIAGNOSIS — Y9289 Other specified places as the place of occurrence of the external cause: Secondary | ICD-10-CM | POA: Diagnosis not present

## 2015-08-11 DIAGNOSIS — S0990XA Unspecified injury of head, initial encounter: Secondary | ICD-10-CM | POA: Insufficient documentation

## 2015-08-11 DIAGNOSIS — M79631 Pain in right forearm: Secondary | ICD-10-CM | POA: Diagnosis not present

## 2015-08-11 DIAGNOSIS — I10 Essential (primary) hypertension: Secondary | ICD-10-CM | POA: Insufficient documentation

## 2015-08-11 DIAGNOSIS — K219 Gastro-esophageal reflux disease without esophagitis: Secondary | ICD-10-CM | POA: Diagnosis not present

## 2015-08-11 DIAGNOSIS — S50811A Abrasion of right forearm, initial encounter: Secondary | ICD-10-CM

## 2015-08-11 DIAGNOSIS — S52041A Displaced fracture of coronoid process of right ulna, initial encounter for closed fracture: Secondary | ICD-10-CM | POA: Diagnosis not present

## 2015-08-11 DIAGNOSIS — S4991XA Unspecified injury of right shoulder and upper arm, initial encounter: Secondary | ICD-10-CM | POA: Diagnosis not present

## 2015-08-11 DIAGNOSIS — M1990 Unspecified osteoarthritis, unspecified site: Secondary | ICD-10-CM | POA: Insufficient documentation

## 2015-08-11 DIAGNOSIS — Y9389 Activity, other specified: Secondary | ICD-10-CM | POA: Insufficient documentation

## 2015-08-11 DIAGNOSIS — M25421 Effusion, right elbow: Secondary | ICD-10-CM | POA: Diagnosis not present

## 2015-08-11 DIAGNOSIS — S59911A Unspecified injury of right forearm, initial encounter: Secondary | ICD-10-CM | POA: Diagnosis not present

## 2015-08-11 DIAGNOSIS — Y998 Other external cause status: Secondary | ICD-10-CM | POA: Diagnosis not present

## 2015-08-11 DIAGNOSIS — W19XXXA Unspecified fall, initial encounter: Secondary | ICD-10-CM

## 2015-08-11 MED ORDER — TRAMADOL HCL 50 MG PO TABS: 50.0000 mg | ORAL_TABLET | Freq: Four times a day (QID) | ORAL | Status: DC | PRN

## 2015-08-11 MED ORDER — TETANUS-DIPHTH-ACELL PERTUSSIS 5-2.5-18.5 LF-MCG/0.5 IM SUSP
0.5000 mL | Freq: Once | INTRAMUSCULAR | Status: AC
  Administered 2015-08-11: 0.5 mL via INTRAMUSCULAR
  Filled 2015-08-11: qty 0.5

## 2015-08-11 NOTE — ED Provider Notes (Signed)
CSN: [id number]     Arrival date & time 08/11/15  2118 History  This chart was scribed for Shirley Gambler, MD by Eustaquio Maize, ED Scribe. This patient was seen in room MH08/MH08 and the patient's care was started at 9:53 PM.  Chief Complaint  Patient presents with  . Fall   The history is provided by the patient. No language interpreter was used.     HPI Comments: [name] is a 74 y.o. female who presents to the Emergency Department complaining of sudden onset, right arm pain s/p falling today while going up a couple of steps. Pt states that she fell to the side and hit her arm and head on a wall. She cannot say how hard she hit her head but denies LOC. She believes the fall was only about 12 inches. Denies headache, nausea, vomiting, chest pain, abdominal pain, weakness, numbness, or any other associated symptoms. Pt takes 81 mg aspirin per day but is not on any anticoagulants. Pt is unsure if she is UTD on tetanus.    Past Medical History  Diagnosis Date  . Osteoporosis   . Hypertension   . GERD (gastroesophageal reflux disease)   . 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 Gallstones   . Wears glasses    Past Surgical History  Procedure Laterality Date  . Bladder repair  [phone]  . Carpal tunnel release  1992    right arm  . Rotator cuff repair  2004    two on right, one on left  . 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 Trigger finger release      right hand  . Lumbar laminectomy/decompression microdiscectomy  11/30/2012    Procedure: LUMBAR LAMINECTOMY/DECOMPRESSION MICRODISCECTOMY 2 LEVELS;  Surgeon: Magnus Sinning, MD;  Location: [facility];  Service: Orthopedics;  Laterality: N/A;  DECOMPRESSION LAMINECTOMY L4-L5, L5-S1 CENTRAL   . Eus N/A 03/09/2013    Procedure: UPPER ENDOSCOPIC ULTRASOUND (EUS) LINEAR;  Surgeon: Beryle Beams, MD;  [facility];  Service: Endoscopy;  Laterality: N/A;  . Abdominal hysterectomy  1983  .  Cholecystectomy N/A 03/29/2013    Procedure: LAPAROSCOPIC CHOLECYSTECTOMY WITH INTRAOPERATIVE CHOLANGIOGRAM;  Surgeon: Haywood Lasso, MD;  Location: [place];  Service: General;  Laterality: N/A;   Family History  Problem Relation Age of Onset  . Other Mother     sarcoma  . Other Father     broken hip   Social History  Substance Use Topics  . Smoking status: Former Smoker -- 0.50 packs/day    Types: Cigarettes    Quit date: 07/11/1970  . Smokeless tobacco: Never Used  . Alcohol Use: 0.0 oz/week    0 Standard drinks or equivalent per week     Comment: wine occasionally   OB History    No data available     Review of Systems  Cardiovascular: Negative for chest pain.  Gastrointestinal: Negative for nausea, vomiting and abdominal pain.  Musculoskeletal: Positive for arthralgias (Right arm pain).  Neurological: Negative for syncope, weakness, numbness and headaches.  All other systems reviewed and are negative.  Allergies  Codeine and Morphine and related  Home Medications   Prior to Admission medications   Medication Sig Start Date End Date Taking? Authorizing Provider  buPROPion (WELLBUTRIN XL) 150 MG 24 hr tablet  07/25/15   Historical Provider, MD  cetirizine (ZYRTEC) 10 MG tablet Take 10 mg by mouth daily.  Historical Provider, MD  Cholecalciferol (VITAMIN D-3) 1000 UNITS CAPS Take by mouth daily.    Historical Provider, MD  diphenhydramine-acetaminophen (TYLENOL PM) 25-500 MG TABS Take 1 tablet by mouth at bedtime as needed.    Historical Provider, MD  DULoxetine (CYMBALTA) 60 MG capsule Take 60 mg by mouth daily.  07/17/15   Historical Provider, MD  hydrochlorothiazide (HYDRODIURIL) 25 MG tablet  03/19/15   Historical Provider, MD  meloxicam (MOBIC) 15 MG tablet Take 15 mg by mouth daily.    Historical Provider, MD  omeprazole-sodium bicarbonate (ZEGERID) 40-1100 MG per capsule Take 1 capsule by mouth daily before breakfast.    Historical Provider, MD   primidone (MYSOLINE) 50 MG tablet Take 2 tablets (100 mg total) by mouth 2 (two) times daily. Patient taking differently: Take 100 mg by mouth at bedtime.  08/21/14   Eustace Quail Tat, DO  Probiotic Product (PROBIOTIC DAILY PO) Take by mouth.    Historical Provider, MD  propranolol ER (INDERAL LA) 80 MG 24 hr capsule Take 1 capsule (80 mg total) by mouth daily. 08/01/15   Eustace Quail Tat, DO  traMADol (ULTRAM) 50 MG tablet Take 50 mg by mouth daily.    Historical Provider, MD  trimethoprim (TRIMPEX) 100 MG tablet  03/19/15   Historical Provider, MD  valsartan (DIOVAN) 160 MG tablet  07/17/15   Historical Provider, MD  zoledronic acid (RECLAST) 5 MG/100ML SOLN Inject 5 mg into the vein once. A year in the fall    Historical Provider, MD   Triage Vitals: BP 143/98 mmHg  Pulse 73  Temp(Src) 98 F (36.7 C) (Oral)  Resp 20  Ht 4\' 11"  (1.499 m)  Wt 214 lb (97.07 kg)  BMI 43.20 kg/m2  SpO2 95%  Physical Exam  Constitutional: She is oriented to person, place, and time. She appears well-developed and well-nourished.  HENT:  Head: Normocephalic and atraumatic.  Right Ear: External ear normal.  Left Ear: External ear normal.  Nose: Nose normal.  No obvious scalp trauma or tenderness   Eyes: Right eye exhibits no discharge. Left eye exhibits no discharge.  Cardiovascular: Normal rate, regular rhythm and normal heart sounds.   Pulses:      Radial pulses are 2+ on the right side.  Pulmonary/Chest: Effort normal and breath sounds normal.  Abdominal: Soft. There is no tenderness.  Musculoskeletal:  Long linear superficial abrasion over her right medial forearm with surrounding ecchymosis and tenderness No tenderness, swelling, or decreased ROM of the wrist or elbow Mild tenderness to distal upper arm No tenderness to shoulder  Neurological: She is alert and oriented to person, place, and time.  Skin: Skin is warm and dry.  Nursing note and vitals reviewed.   ED Course  Procedures (including  critical care time)  DIAGNOSTIC STUDIES: Oxygen Saturation is 95% on RA, normal by my interpretation.    COORDINATION OF CARE: 10:02 PM-Discussed treatment plan which includes DG R Forearm, DG R Humerus, CT Head with pt at bedside and pt agreed to plan.   Labs Review Labs Reviewed - No data to display  Imaging Review Dg Forearm Right  08/11/2015   CLINICAL DATA:  Status post fall, with right forearm pain. Initial encounter.  EXAM: RIGHT FOREARM - 2 VIEW  COMPARISON:  None.  FINDINGS: There is question of cortical irregularity at the coronoid process of the proximal ulna. The radial head appears grossly intact. The carpal rows are grossly unremarkable in appearance. No definite elbow joint effusion is characterized. No  significant soft tissue abnormalities are seen on radiograph.  IMPRESSION: Question of cortical irregularity at the coronoid process of the proximal ulna. This is not well assessed on provided images. Depending on the patient's symptoms, dedicated elbow radiographs would be helpful for further evaluation.   Electronically Signed   By: Garald Balding M.D.   On: 08/11/2015 22:44   Ct Head Wo Contrast  08/11/2015   CLINICAL DATA:  [place], striking the right side of her head.  EXAM: CT HEAD WITHOUT CONTRAST  TECHNIQUE: Contiguous axial images were obtained from the base of the skull through the vertex without intravenous contrast.  COMPARISON:  MR 03/09/2008  FINDINGS: There is no intracranial hemorrhage or extra-axial fluid collection. There is moderately severe generalized atrophy. There is severe white matter hypodensity consistent with chronic small vessel disease but it is not specific and other white matter disease is could also produce this appearance. There is no mass. There is no evidence of acute infarction. 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  IMPRESSION: Negative for acute intracranial  traumatic injury. There is moderately severe generalized atrophy and chronic white matter hypodensities. Embolization coils in the left posterior fossa cause mild metal artifact.   Electronically Signed   By: Andreas Newport M.D.   On: 08/11/2015 22:53   Dg Humerus Right  08/11/2015   CLINICAL DATA:  Right arm trauma, pain  EXAM: RIGHT HUMERUS - 2+ VIEW  COMPARISON:  Arthrography 07/14/2012.  FINDINGS: There is no evidence of fracture or other focal bone lesions. Soft tissues are unremarkable. Right humeral bone anchors are noted.  IMPRESSION: Negative.   Electronically Signed   By: Conchita Paris M.D.   On: 08/11/2015 22:46   I have personally reviewed and evaluated these images and lab results as part of my medical decision-making.   EKG Interpretation None      MDM   Final diagnoses:  Fall, initial encounter  Forearm contusion, right, initial encounter  Forearm abrasion, right, initial encounter  Contusion, arm, upper, right, initial encounter    Patient's head CT is negative with minor injury. Doubt missed significant intracranial injury. Xrays shows possible irregularity at elbow. Has normal ROM but does have proximal forearm pain, will get dedicated xrays of elbow. Care transferred to Dr. Lita Mains with this xray pending, if negative, d/c, otherwise if possible fracture splint and d/c with ortho f/u. NV intact. Declines pain meds in ED.    I personally performed the services described in this documentation, which was scribed in my presence. The recorded information has been reviewed and is accurate.     Shirley Gambler, MD 08/11/15 2325

## 2015-08-11 NOTE — ED Notes (Signed)
[place] going up stone steps. Injury to her right arm. Abrasion to her forearm. She hit the right side of her head. No loc. She took Advil 400 mg.

## 2015-08-11 NOTE — Discharge Instructions (Signed)
Call and make appointment to follow-up with the orthopedist.   Fall Prevention and Home Safety Falls cause injuries and can affect all age groups. It is possible to prevent falls.  HOW TO PREVENT FALLS  Wear shoes with rubber soles that do not have an opening for your toes.  Keep the inside and outside of your house well lit.  Use night lights throughout your home.  Remove clutter from floors.  Clean up floor spills.  Remove throw rugs or fasten them to the floor with carpet tape.  Do not place electrical cords across pathways.  Put grab bars by your tub, shower, and toilet. Do not use towel bars as grab bars.  Put handrails on both sides of the stairway. Fix loose handrails.  Do not climb on stools or stepladders, if possible.  Do not wax your floors.  Repair uneven or unsafe sidewalks, walkways, or stairs.  Keep items you use a lot within reach.  Be aware of pets.  Keep emergency numbers next to the telephone.  Put smoke detectors in your home and near bedrooms. Ask your doctor what other things you can do to prevent falls. Document Released: 09/25/2009 Document Revised: 05/30/2012 Document Reviewed: 02/29/2012 [facility] Patient Information 2015 [place], [place]. This information is not intended to replace advice given to you by your health care provider. Make sure you discuss any questions you have with your health care provider.  Elbow Fracture, Simple A fracture is a break in one of the bones.When fractures are not displaced or separated, they may be treated with only a sling or splint. The sling or splint may only be required for two to three weeks. In these cases, often the elbow is put through early range of motion exercises to prevent the elbow from getting stiff. DIAGNOSIS  The diagnosis (learning what is wrong) of a fractured elbow is made by x-ray. These may be required before and after the elbow is put into a splint or cast. X-rays are taken after to make sure  the bone pieces have not moved. HOME CARE INSTRUCTIONS   Only take over-the-counter or prescription medicines for pain, discomfort, or fever as directed by your caregiver.  If you have a splint held on with an elastic wrap and your hand or fingers become numb or cold and blue, loosen the wrap and reapply more loosely. See your caregiver if there is no relief.  You may use ice for twenty minutes, four times per day, for the first two to three days.  Use your elbow as directed.  See your caregiver as directed. It is very important to keep all follow-up referrals and appointments in order to avoid any long-term problems with your elbow including chronic pain or stiffness. SEEK IMMEDIATE MEDICAL CARE IF:   There is swelling or increasing pain in elbow.  You begin to lose feeling or experience numbness or tingling in your hand or fingers.  You develop swelling of the hand and fingers.  You get a cold or blue hand or fingers on affected side. MAKE SURE YOU:   Understand these instructions.  Will watch your condition.  Will get help right away if you are not doing well or get worse. Document Released: 11/23/2001 Document Revised: 02/21/2012 Document Reviewed: 10/14/2009 [facility] Patient Information 2015 [place], [place]. This information is not intended to replace advice given to you by your health care provider. Make sure you discuss any questions you have with your health care provider.

## 2015-08-12 DIAGNOSIS — S5011XA Contusion of right forearm, initial encounter: Secondary | ICD-10-CM | POA: Diagnosis not present

## 2015-08-19 DIAGNOSIS — S52044A Nondisplaced fracture of coronoid process of right ulna, initial encounter for closed fracture: Secondary | ICD-10-CM | POA: Diagnosis not present

## 2015-08-20 DIAGNOSIS — M5416 Radiculopathy, lumbar region: Secondary | ICD-10-CM | POA: Diagnosis not present

## 2015-08-20 DIAGNOSIS — M47816 Spondylosis without myelopathy or radiculopathy, lumbar region: Secondary | ICD-10-CM | POA: Diagnosis not present

## 2015-08-20 DIAGNOSIS — M5136 Other intervertebral disc degeneration, lumbar region: Secondary | ICD-10-CM | POA: Diagnosis not present

## 2015-08-20 DIAGNOSIS — I10 Essential (primary) hypertension: Secondary | ICD-10-CM | POA: Diagnosis not present

## 2015-08-24 ENCOUNTER — Emergency Department (HOSPITAL_COMMUNITY): Payer: Medicare Other

## 2015-08-24 ENCOUNTER — Encounter (HOSPITAL_COMMUNITY): Payer: Self-pay | Admitting: Emergency Medicine

## 2015-08-24 ENCOUNTER — Emergency Department (HOSPITAL_COMMUNITY)
Admission: EM | Admit: 2015-08-24 | Discharge: 2015-08-24 | Disposition: A | Discharge status: Home / Self Care | Payer: Medicare Other | Attending: Emergency Medicine | Admitting: Emergency Medicine

## 2015-08-24 DIAGNOSIS — Z8739 Personal history of other diseases of the musculoskeletal system and connective tissue: Secondary | ICD-10-CM | POA: Diagnosis not present

## 2015-08-24 DIAGNOSIS — I10 Essential (primary) hypertension: Secondary | ICD-10-CM | POA: Diagnosis not present

## 2015-08-24 DIAGNOSIS — Y998 Other external cause status: Secondary | ICD-10-CM | POA: Diagnosis not present

## 2015-08-24 DIAGNOSIS — Y92000 Kitchen of unspecified non-institutional (private) residence as  the place of occurrence of the external cause: Secondary | ICD-10-CM | POA: Insufficient documentation

## 2015-08-24 DIAGNOSIS — M25521 Pain in right elbow: Secondary | ICD-10-CM | POA: Diagnosis not present

## 2015-08-24 DIAGNOSIS — Z973 Presence of spectacles and contact lenses: Secondary | ICD-10-CM | POA: Diagnosis not present

## 2015-08-24 DIAGNOSIS — S0101XA Laceration without foreign body of scalp, initial encounter: Secondary | ICD-10-CM | POA: Diagnosis not present

## 2015-08-24 DIAGNOSIS — W19XXXA Unspecified fall, initial encounter: Secondary | ICD-10-CM

## 2015-08-24 DIAGNOSIS — Z87891 Personal history of nicotine dependence: Secondary | ICD-10-CM | POA: Insufficient documentation

## 2015-08-24 DIAGNOSIS — K219 Gastro-esophageal reflux disease without esophagitis: Secondary | ICD-10-CM | POA: Diagnosis not present

## 2015-08-24 DIAGNOSIS — S0190XA Unspecified open wound of unspecified part of head, initial encounter: Secondary | ICD-10-CM | POA: Diagnosis not present

## 2015-08-24 DIAGNOSIS — Z79899 Other long term (current) drug therapy: Secondary | ICD-10-CM | POA: Insufficient documentation

## 2015-08-24 DIAGNOSIS — S098XXA Other specified injuries of head, initial encounter: Secondary | ICD-10-CM | POA: Diagnosis not present

## 2015-08-24 DIAGNOSIS — W01198A Fall on same level from slipping, tripping and stumbling with subsequent striking against other object, initial encounter: Secondary | ICD-10-CM | POA: Diagnosis not present

## 2015-08-24 DIAGNOSIS — S199XXA Unspecified injury of neck, initial encounter: Secondary | ICD-10-CM | POA: Diagnosis not present

## 2015-08-24 DIAGNOSIS — S0990XA Unspecified injury of head, initial encounter: Secondary | ICD-10-CM | POA: Diagnosis not present

## 2015-08-24 DIAGNOSIS — I951 Orthostatic hypotension: Secondary | ICD-10-CM | POA: Diagnosis not present

## 2015-08-24 DIAGNOSIS — Y9301 Activity, walking, marching and hiking: Secondary | ICD-10-CM | POA: Diagnosis not present

## 2015-08-24 LAB — CBC WITH DIFFERENTIAL/PLATELET
BASOS PCT: 0 % (ref 0–1)
Basophils Absolute: 0.1 10*3/uL (ref 0.0–0.1)
Eosinophils Absolute: 0.1 10*3/uL (ref 0.0–0.7)
Eosinophils Relative: 1 % (ref 0–5)
HCT: 35.9 % — ABNORMAL LOW (ref 36.0–46.0)
Hemoglobin: 12.3 g/dL (ref 12.0–15.0)
LYMPHS PCT: 18 % (ref 12–46)
Lymphs Abs: 2 10*3/uL (ref 0.7–4.0)
MCH: 32.5 pg (ref 26.0–34.0)
MCHC: 34.3 g/dL (ref 30.0–36.0)
MCV: 94.7 fL (ref 78.0–100.0)
Monocytes Absolute: 1.2 10*3/uL — ABNORMAL HIGH (ref 0.1–1.0)
Monocytes Relative: 11 % (ref 3–12)
NEUTROS PCT: 70 % (ref 43–77)
Neutro Abs: 7.9 10*3/uL — ABNORMAL HIGH (ref 1.7–7.7)
Platelets: 240 10*3/uL (ref 150–400)
RBC: 3.79 MIL/uL — ABNORMAL LOW (ref 3.87–5.11)
RDW: 13.3 % (ref 11.5–15.5)
WBC: 11.2 10*3/uL — ABNORMAL HIGH (ref 4.0–10.5)

## 2015-08-24 LAB — COMPREHENSIVE METABOLIC PANEL
ALK PHOS: 60 U/L (ref 38–126)
ALT: 18 U/L (ref 14–54)
ANION GAP: 9 (ref 5–15)
AST: 19 U/L (ref 15–41)
Albumin: 4.2 g/dL (ref 3.5–5.0)
BUN: 25 mg/dL — ABNORMAL HIGH (ref 6–20)
CALCIUM: 8.9 mg/dL (ref 8.9–10.3)
CO2: 23 mmol/L (ref 22–32)
Chloride: 100 mmol/L — ABNORMAL LOW (ref 101–111)
Creatinine, Ser: 0.88 mg/dL (ref 0.44–1.00)
GFR calc Af Amer: >60 mL/min (ref >60)
GFR calc non Af Amer: >60 mL/min (ref >60)
Glucose, Bld: 98 mg/dL (ref 65–99)
POTASSIUM: 4.5 mmol/L (ref 3.5–5.1)
SODIUM: 132 mmol/L — AB (ref 135–145)
Total Bilirubin: 0.5 mg/dL (ref 0.3–1.2)
Total Protein: 7.3 g/dL (ref 6.5–8.1)

## 2015-08-24 LAB — URINALYSIS, ROUTINE W REFLEX MICROSCOPIC
Bilirubin Urine: NEGATIVE
Glucose, UA: NEGATIVE mg/dL
Hgb urine dipstick: NEGATIVE
Ketones, ur: NEGATIVE mg/dL
LEUKOCYTES UA: NEGATIVE
NITRITE: NEGATIVE
Protein, ur: NEGATIVE mg/dL
SPECIFIC GRAVITY, URINE: 1.014 (ref 1.005–1.030)
UROBILINOGEN UA: 0.2 mg/dL (ref 0.0–1.0)
pH: 6 (ref 5.0–8.0)

## 2015-08-24 MED ORDER — SODIUM CHLORIDE 0.9 % IV BOLUS (SEPSIS)
500.0000 mL | Freq: Once | INTRAVENOUS | Status: AC
  Administered 2015-08-24: 500 mL via INTRAVENOUS

## 2015-08-24 NOTE — ED Notes (Addendum)
Pt states that she lost her balance today and fell, hitting the back of her head. No LOC. Only blood thinnner is aspirin. Denies neck or back pain. R shoulder pain from previous fall. 2 inch lac on back of head. Bleeding controlled. Alert and oriented. From home.

## 2015-08-24 NOTE — ED Notes (Signed)
Patient transported to X-ray 

## 2015-08-24 NOTE — ED Notes (Signed)
Bed: WA02 Expected date:  Expected time:  Means of arrival:  Comments: Fall, head laceration

## 2015-08-24 NOTE — ED Notes (Signed)
PA at bedside.

## 2015-08-24 NOTE — ED Provider Notes (Signed)
CSN: [id number]     Arrival date & time 08/24/15  1855 History   First MD Initiated Contact with Patient 08/24/15 1919     Chief Complaint  Patient presents with  . Fall     (Consider location/radiation/quality/duration/timing/severity/associated sxs/prior Treatment) The history is provided by the patient.     Pt with hx tremor, OSA p/w 3rd fall in 10 days.  States that when she attempts to turn quickly to the side she loses her balance and falls.  Husband notes pt occasionally takes a "studder step" while walking.   Today she was walking across her kitchen floor and stumbled, fell backwards hitting her head on either the dishwasher or the floor.  Denies LOC.  Husband reports there is a flap over the posterior scalp.  Pt has had pain in her right elbow since one of her prior falls (had negative xray for this during prior visit), the pain has increased with each fall.  Reports some mild tightness in her neck.  Denies any other injuries or pain.  Pt has been looking on the internet and noting her symptoms - has noticed her feet have been colder than usual at night and she has also been breaking out into sweats and feeling hot.  The hot feeling and sweating has been random.  She also had had a recent medication change of the addition of a beta blocker on 07/28/15 for her tremor.  All of her falls have occurred after starting this medication.  She does note that she does not feel lightheaded or dizzy, and the stumbling and off balance sensation do not always occur just after standing up from sitting.    Past Medical History  Diagnosis Date  . Osteoporosis   . Hypertension   . GERD (gastroesophageal reflux disease)   . 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 Gallstones   . Wears glasses    Past Surgical History  Procedure Laterality Date  . Bladder repair  [phone]  . Carpal tunnel release  1992    right arm  . Rotator cuff repair  2004    two on right, one on left  . 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 Trigger finger release      right hand  . Lumbar laminectomy/decompression microdiscectomy  11/30/2012    Procedure: LUMBAR LAMINECTOMY/DECOMPRESSION MICRODISCECTOMY 2 LEVELS;  Surgeon: Magnus Sinning, MD;  Location: [facility];  Service: Orthopedics;  Laterality: N/A;  DECOMPRESSION LAMINECTOMY L4-L5, L5-S1 CENTRAL   . Eus N/A 03/09/2013    Procedure: UPPER ENDOSCOPIC ULTRASOUND (EUS) LINEAR;  Surgeon: Beryle Beams, MD;  Location: [facility];  Service: Endoscopy;  Laterality: N/A;  . Abdominal hysterectomy  1983  . Cholecystectomy N/A 03/29/2013    Procedure: LAPAROSCOPIC CHOLECYSTECTOMY WITH INTRAOPERATIVE CHOLANGIOGRAM;  Surgeon: Haywood Lasso, MD;  Location: [facility];  Service: General;  Laterality: N/A;   Family History  Problem Relation Age of Onset  . Other Mother     sarcoma  . Other Father     broken hip   Social History  Substance Use Topics  . Smoking status: Former Smoker -- 0.50 packs/day    Types: Cigarettes    Quit date: 07/11/1970  . Smokeless tobacco: Never Used  . Alcohol Use: 0.0 oz/week    0 Standard drinks or equivalent per week     Comment: wine occasionally   OB History    No  data available     Review of Systems  All other systems reviewed and are negative.     Allergies  Buprenorphine hcl; Codeine; and Morphine and related  Home Medications   Prior to Admission medications   Medication Sig Start Date End Date Taking? Authorizing Provider  buPROPion (WELLBUTRIN XL) 150 MG 24 hr tablet Take 150 mg by mouth daily.  07/25/15  Yes Historical Provider, MD  cetirizine (ZYRTEC) 10 MG tablet Take 10 mg by mouth daily.   Yes Historical Provider, MD  Cholecalciferol (VITAMIN D-3) 1000 UNITS CAPS Take 1,000 Units by mouth daily.    Yes Historical Provider, MD  diphenhydramine-acetaminophen (TYLENOL PM) 25-500 MG TABS Take 1 tablet by mouth at bedtime as needed (sleep).    Yes Historical Provider,  MD  DULoxetine (CYMBALTA) 60 MG capsule Take 60 mg by mouth daily.  07/17/15  Yes Historical Provider, MD  hydrochlorothiazide (HYDRODIURIL) 25 MG tablet Take 25 mg by mouth daily.  03/19/15  Yes Historical Provider, MD  meloxicam (MOBIC) 15 MG tablet Take 15 mg by mouth daily.   Yes Historical Provider, MD  pantoprazole (PROTONIX) 40 MG tablet Take 40 mg by mouth 2 (two) times daily. 08/22/15  Yes Historical Provider, MD  primidone (MYSOLINE) 50 MG tablet Take 2 tablets (100 mg total) by mouth 2 (two) times daily. Patient taking differently: Take 100 mg by mouth at bedtime.  08/21/14  Yes Rebecca S Tat, DO  Probiotic Product (PROBIOTIC DAILY PO) Take 1 tablet by mouth daily.    Yes Historical Provider, MD  propranolol ER (INDERAL LA) 80 MG 24 hr capsule Take 1 capsule (80 mg total) by mouth daily. 08/01/15  Yes Rebecca S Tat, DO  traMADol (ULTRAM) 50 MG tablet Take 1 tablet (50 mg total) by mouth every 6 (six) hours as needed for severe pain. 08/11/15  Yes Julianne Rice, MD  trimethoprim (TRIMPEX) 100 MG tablet Take 100 mg by mouth daily.  03/19/15  Yes Historical Provider, MD  valsartan (DIOVAN) 160 MG tablet Take 160 mg by mouth daily.  07/17/15  Yes Historical Provider, MD   BP 152/72 mmHg  Pulse 66  Temp(Src) 97.4 F (36.3 C) (Oral)  Resp 18  SpO2 95% Physical Exam  Constitutional: She appears well-developed and well-nourished. No distress.  HENT:  Head: Normocephalic.  Posterior scalp with 2cm laceration, hemostatic.  Also small abrasion, also hemostatic.    Neck: Neck supple.  Cardiovascular: Normal rate and regular rhythm.   Pulmonary/Chest: Effort normal and breath sounds normal. No respiratory distress. She has no wheezes. She has no rales.  Abdominal: Soft. She exhibits no distension. There is no tenderness. There is no rebound and no guarding.  Musculoskeletal: She exhibits no tenderness.  Neurological: She is alert.  Skin: She is not diaphoretic.  Nursing note and vitals  reviewed.   ED Course  Procedures (including critical care time) Labs Review Labs Reviewed  COMPREHENSIVE METABOLIC PANEL - Abnormal; Notable for the following:    Sodium 132 (*)    Chloride 100 (*)    BUN 25 (*)    All other components within normal limits  CBC WITH DIFFERENTIAL/PLATELET - Abnormal; Notable for the following:    WBC 11.2 (*)    RBC 3.79 (*)    HCT 35.9 (*)    Neutro Abs 7.9 (*)    Monocytes Absolute 1.2 (*)    All other components within normal limits  URINALYSIS, ROUTINE W REFLEX MICROSCOPIC (NOT AT [facility])    Imaging  Review Dg Elbow Complete Right  08/24/2015   CLINICAL DATA:  Acute right elbow pain after fall today. Initial encounter.  EXAM: RIGHT ELBOW - COMPLETE 3+ VIEW  COMPARISON:  August 11, 2015.  FINDINGS: There is no evidence of fracture, dislocation, or joint effusion. There is no evidence of arthropathy or other focal bone abnormality. Soft tissues are unremarkable.  IMPRESSION: No definite abnormality seen in the right elbow.   Electronically Signed   By: Marijo Conception, M.D.   On: 08/24/2015 21:23   Ct Head Wo Contrast  08/24/2015   CLINICAL DATA:  74 year old female with fall  EXAM: CT HEAD WITHOUT CONTRAST  CT CERVICAL SPINE WITHOUT CONTRAST  TECHNIQUE: Multidetector CT imaging of the head and cervical spine was performed following the standard protocol without intravenous contrast. Multiplanar CT image reconstructions of the cervical spine were also generated.  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  The ventricles are dilated and the sulci are prominent compatible with age-related atrophy. Periventricular and deep white matter hypodensities represent chronic microvascular ischemic changes. There is no intracranial hemorrhage. No mass effect or midline shift identified. The visualized paranasal sinuses and mastoid air cells are well  aerated. The calvarium is intact.  CT CERVICAL SPINE FINDINGS  There is no acute fracture or subluxation of the cervical spine. There is incomplete bone fusion of the posterior ring of C1. There is irregularity of the anterior arch of the C1 which may be related to an old healed fracture. There are multilevel degenerative changes with facet hypertrophy. Multilevel disc desiccation with vacuum phenomenon noted.The odontoid and spinous processes are intact.There is normal anatomic alignment of the C1-C2 lateral masses. The visualized soft tissues appear unremarkable.  IMPRESSION: No acute intracranial pathology.  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  No acute/traumatic cervical spine pathology.   Electronically Signed   By: Anner Crete M.D.   On: 08/24/2015 21:38   Ct Cervical Spine Wo Contrast  08/24/2015   CLINICAL DATA:  74 year old female with fall  EXAM: CT HEAD WITHOUT CONTRAST  CT CERVICAL SPINE WITHOUT CONTRAST  TECHNIQUE: Multidetector CT imaging of the head and cervical spine was performed following the standard protocol without intravenous contrast. Multiplanar CT image reconstructions of the cervical spine were also generated.  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  The ventricles are dilated and the sulci are prominent compatible with age-related atrophy. Periventricular and deep white matter hypodensities represent chronic microvascular ischemic changes. There is no intracranial hemorrhage. No mass effect or midline shift identified. The visualized paranasal sinuses and mastoid air cells are well aerated. The calvarium is intact.  CT CERVICAL SPINE FINDINGS  There is no acute fracture or subluxation of the cervical spine. There is incomplete bone fusion of the posterior ring of C1. There is irregularity of the anterior arch of the C1 which may be  related to an old healed fracture. There are multilevel degenerative changes with facet hypertrophy. Multilevel disc desiccation with vacuum phenomenon noted.The odontoid and spinous processes are intact.There is normal anatomic alignment of the C1-C2 lateral masses. The visualized soft tissues appear unremarkable.  IMPRESSION: No acute intracranial pathology.  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  No acute/traumatic cervical spine pathology.   Electronically Signed  By: Anner Crete M.D.   On: 08/24/2015 21:38   I have personally reviewed and evaluated these images and lab results as part of my medical decision-making.   EKG Interpretation   Date/Time:  Sunday August 24 2015 19:58:47 EDT Ventricular Rate:  54 PR Interval:  216 QRS Duration: 89 QT Interval:  418 QTC Calculation: 396 R Axis:   21 Text Interpretation:  Sinus arrhythmia Borderline prolonged PR interval  Low voltage, precordial leads Confirmed by Zenia Resides  MD, ANTHONY ([id number]) on  08/24/2015 8:35:56 PM       8:39 PM Discussed pt with Dr Zenia Resides.  Anticipate d/c home if no significant abnormalities in workup.  Will stop beta blocker temporarily.  Close PCP follow up.   LACERATION REPAIR Performed by: Clayton Bibles Authorized by: Clayton Bibles Consent: Verbal consent obtained. Risks and benefits: risks, benefits and alternatives were discussed Consent given by: patient Patient identity confirmed: provided demographic data Prepped and Draped in normal sterile fashion Wound explored  Laceration Location: posterior scalp  Laceration Length: 2cm  No Foreign Bodies seen or palpated  Anesthesia: none  Irrigation method: syringe Amount of cleaning: standard  Skin closure: staples  Number of sutures: 2  Technique: staples  Patient tolerance: Patient tolerated the procedure well with no immediate complications.   MDM   Final diagnoses:  Fall, initial encounter  Scalp laceration, initial  encounter  Orthostasis    Afebrile, nontoxic patient with three falls in past 10 days with issue of becoming off balance when turning quickly and also having "studder steps" with walking.  Her blood pressure does drop with orthostatic testing but she does not become hypotensive.  Labs reflective of mild dehydration.  Discussed patient with Dr Zenia Resides. Scalp wound repaired in ED.  Pt has cane and walker at home, I have recommended she use them for security and support until she follows up with her PCP Dr [name] [facility]).  D/C home with recommendation to stop beta blocker (on it for tremor), close PCP follow up.  Discussed result, findings, treatment, and follow up  with patient.  Pt given return precautions.  Pt verbalizes understanding and agrees with plan.          Clayton Bibles, PA-C 08/25/15 0004  Lacretia Leigh, MD 08/25/15 [phone]

## 2015-08-24 NOTE — ED Notes (Signed)
Patient was alert, oriented and stable upon discharge. RN went over AVS and patient had no further questions.  

## 2015-08-24 NOTE — Discharge Instructions (Signed)
Read the information below.  You may return to the Emergency Department at any time for worsening condition or any new symptoms that concern you.  Please hold your beta blocker until you follow up with and discuss this with your primary care provider.  Drink plenty of fluids over the next few days.  If you develop fevers, severe headache, weakness or numbness of the arms or legs, difficulty speaking or walking, uncontrolled dizziness, or if you pass out, call 911 and return to the Emergency Department immediately.   If you develop redness, swelling, pus draining from the wound, or fevers greater than 100.4, return to the ER immediately for a recheck.     Fall Prevention and Home Safety Falls cause injuries and can affect all age groups. It is possible to use preventive measures to significantly decrease the likelihood of falls. There are many simple measures which can make your home safer and prevent falls. OUTDOORS  Repair cracks and edges of walkways and driveways.  Remove high doorway thresholds.  Trim shrubbery on the main path into your home.  Have good outside lighting.  Clear walkways of tools, rocks, debris, and clutter.  Check that handrails are not broken and are securely fastened. Both sides of steps should have handrails.  Have leaves, snow, and ice cleared regularly.  Use sand or salt on walkways during winter months.  In the garage, clean up grease or oil spills. BATHROOM  Install night lights.  Install grab bars by the toilet and in the tub and shower.  Use non-skid mats or decals in the tub or shower.  Place a plastic non-slip stool in the shower to sit on, if needed.  Keep floors dry and clean up all water on the floor immediately.  Remove soap buildup in the tub or shower on a regular basis.  Secure bath mats with non-slip, double-sided rug tape.  Remove throw rugs and tripping hazards from the floors. BEDROOMS  Install night lights.  Make sure a bedside  light is easy to reach.  Do not use oversized bedding.  Keep a telephone by your bedside.  Have a firm chair with side arms to use for getting dressed.  Remove throw rugs and tripping hazards from the floor. KITCHEN  Keep handles on pots and pans turned toward the center of the stove. Use back burners when possible.  Clean up spills quickly and allow time for drying.  Avoid walking on wet floors.  Avoid hot utensils and knives.  Position shelves so they are not too high or low.  Place commonly used objects within easy reach.  If necessary, use a sturdy step stool with a grab bar when reaching.  Keep electrical cables out of the way.  Do not use floor polish or wax that makes floors slippery. If you must use wax, use non-skid floor wax.  Remove throw rugs and tripping hazards from the floor. STAIRWAYS  Never leave objects on stairs.  Place handrails on both sides of stairways and use them. Fix any loose handrails. Make sure handrails on both sides of the stairways are as long as the stairs.  Check carpeting to make sure it is firmly attached along stairs. Make repairs to worn or loose carpet promptly.  Avoid placing throw rugs at the top or bottom of stairways, or properly secure the rug with carpet tape to prevent slippage. Get rid of throw rugs, if possible.  Have an electrician put in a light switch at the top and bottom  of the stairs. OTHER FALL PREVENTION TIPS  Wear low-heel or rubber-soled shoes that are supportive and fit well. Wear closed toe shoes.  When using a stepladder, make sure it is fully opened and both spreaders are firmly locked. Do not climb a closed stepladder.  Add color or contrast paint or tape to grab bars and handrails in your home. Place contrasting color strips on first and last steps.  Learn and use mobility aids as needed. Install an electrical emergency response system.  Turn on lights to avoid dark areas. Replace light bulbs that burn  out immediately. Get light switches that glow.  Arrange furniture to create clear pathways. Keep furniture in the same place.  Firmly attach carpet with non-skid or double-sided tape.  Eliminate uneven floor surfaces.  Select a carpet pattern that does not visually hide the edge of steps.  Be aware of all pets. OTHER HOME SAFETY TIPS  Set the water temperature for 120 F (48.8 C).  Keep emergency numbers on or near the telephone.  Keep smoke detectors on every level of the home and near sleeping areas. Document Released: 11/19/2002 Document Revised: 05/30/2012 Document Reviewed: 02/18/2012 [facility] Patient Information 2015 [place], [place]. This information is not intended to replace advice given to you by your health care provider. Make sure you discuss any questions you have with your health care provider.  Head Injury You have received a head injury. It does not appear serious at this time. Headaches and vomiting are common following head injury. It should be easy to awaken from sleeping. Sometimes it is necessary for you to stay in the emergency department for a while for observation. Sometimes admission to the hospital may be needed. After injuries such as yours, most problems occur within the first 24 hours, but side effects may occur up to 7-10 days after the injury. It is important for you to carefully monitor your condition and contact your health care provider or seek immediate medical care if there is a change in your condition. WHAT ARE THE TYPES OF HEAD INJURIES? Head injuries can be as minor as a bump. Some head injuries can be more severe. More severe head injuries include:  A jarring injury to the brain (concussion).  A bruise of the brain (contusion). This mean there is bleeding in the brain that can cause swelling.  A cracked skull (skull fracture).  Bleeding in the brain that collects, clots, and forms a bump (hematoma). WHAT CAUSES A HEAD INJURY? A serious head  injury is most likely to happen to someone who is in a car wreck and is not wearing a seat belt. Other causes of major head injuries include bicycle or motorcycle accidents, sports injuries, and falls. HOW ARE HEAD INJURIES DIAGNOSED? A complete history of the event leading to the injury and your current symptoms will be helpful in diagnosing head injuries. Many times, pictures of the brain, such as CT or MRI are needed to see the extent of the injury. Often, an overnight hospital stay is necessary for observation.  WHEN SHOULD I SEEK IMMEDIATE MEDICAL CARE?  You should get help right away if:  You have confusion or drowsiness.  You feel sick to your stomach (nauseous) or have continued, forceful vomiting.  You have dizziness or unsteadiness that is getting worse.  You have severe, continued headaches not relieved by medicine. Only take over-the-counter or prescription medicines for pain, fever, or discomfort as directed by your health care provider.  You do not have normal function  of the arms or legs or are unable to walk.  You notice changes in the black spots in the center of the colored part of your eye (pupil).  You have a clear or bloody fluid coming from your nose or ears.  You have a loss of vision. During the next 24 hours after the injury, you must stay with someone who can watch you for the warning signs. This person should contact local emergency services (911 in the [place] if you have seizures, you become unconscious, or you are unable to wake up. HOW CAN I PREVENT A HEAD INJURY IN THE FUTURE? The most important factor for preventing major head injuries is avoiding motor vehicle accidents. To minimize the potential for damage to your head, it is crucial to wear seat belts while riding in motor vehicles. Wearing helmets while bike riding and playing collision sports (like football) is also helpful. Also, avoiding dangerous activities around the house will further help reduce your  risk of head injury.  WHEN CAN I RETURN TO NORMAL ACTIVITIES AND ATHLETICS? You should be reevaluated by your health care provider before returning to these activities. If you have any of the following symptoms, you should not return to activities or contact sports until 1 week after the symptoms have stopped:  Persistent headache.  Dizziness or vertigo.  Poor attention and concentration.  Confusion.  Memory problems.  Nausea or vomiting.  Fatigue or tire easily.  Irritability.  Intolerant of bright lights or loud noises.  Anxiety or depression.  Disturbed sleep. MAKE SURE YOU:   Understand these instructions.  Will watch your condition.  Will get help right away if you are not doing well or get worse. Document Released: 11/29/2005 Document Revised: 12/04/2013 Document Reviewed: 08/06/2013 [facility] Patient Information 2015 [place], [place]. This information is not intended to replace advice given to you by your health care provider. Make sure you discuss any questions you have with your health care provider.

## 2015-08-26 DIAGNOSIS — Z6841 Body Mass Index (BMI) 40.0 and over, adult: Secondary | ICD-10-CM | POA: Diagnosis not present

## 2015-08-26 DIAGNOSIS — G250 Essential tremor: Secondary | ICD-10-CM | POA: Diagnosis not present

## 2015-08-26 DIAGNOSIS — G629 Polyneuropathy, unspecified: Secondary | ICD-10-CM | POA: Diagnosis not present

## 2015-08-26 DIAGNOSIS — I951 Orthostatic hypotension: Secondary | ICD-10-CM | POA: Diagnosis not present

## 2015-08-26 DIAGNOSIS — S0101XA Laceration without foreign body of scalp, initial encounter: Secondary | ICD-10-CM | POA: Diagnosis not present

## 2015-08-26 DIAGNOSIS — R296 Repeated falls: Secondary | ICD-10-CM | POA: Diagnosis not present

## 2015-08-26 DIAGNOSIS — R269 Unspecified abnormalities of gait and mobility: Secondary | ICD-10-CM | POA: Diagnosis not present

## 2015-08-28 ENCOUNTER — Encounter (HOSPITAL_BASED_OUTPATIENT_CLINIC_OR_DEPARTMENT_OTHER): Payer: Self-pay | Admitting: *Deleted

## 2015-08-28 ENCOUNTER — Other Ambulatory Visit: Payer: Self-pay | Admitting: Internal Medicine

## 2015-08-28 ENCOUNTER — Emergency Department (HOSPITAL_BASED_OUTPATIENT_CLINIC_OR_DEPARTMENT_OTHER)
Admission: EM | Admit: 2015-08-28 | Discharge: 2015-08-28 | Disposition: A | Discharge status: Home / Self Care | Payer: Medicare Other | Attending: Emergency Medicine | Admitting: Emergency Medicine

## 2015-08-28 ENCOUNTER — Emergency Department (HOSPITAL_BASED_OUTPATIENT_CLINIC_OR_DEPARTMENT_OTHER): Payer: Medicare Other

## 2015-08-28 DIAGNOSIS — S8391XA Sprain of unspecified site of right knee, initial encounter: Secondary | ICD-10-CM | POA: Insufficient documentation

## 2015-08-28 DIAGNOSIS — M810 Age-related osteoporosis without current pathological fracture: Secondary | ICD-10-CM | POA: Diagnosis not present

## 2015-08-28 DIAGNOSIS — I10 Essential (primary) hypertension: Secondary | ICD-10-CM | POA: Insufficient documentation

## 2015-08-28 DIAGNOSIS — Y9301 Activity, walking, marching and hiking: Secondary | ICD-10-CM | POA: Diagnosis not present

## 2015-08-28 DIAGNOSIS — R2681 Unsteadiness on feet: Secondary | ICD-10-CM

## 2015-08-28 DIAGNOSIS — Y9289 Other specified places as the place of occurrence of the external cause: Secondary | ICD-10-CM | POA: Insufficient documentation

## 2015-08-28 DIAGNOSIS — Z87891 Personal history of nicotine dependence: Secondary | ICD-10-CM | POA: Diagnosis not present

## 2015-08-28 DIAGNOSIS — M179 Osteoarthritis of knee, unspecified: Secondary | ICD-10-CM | POA: Diagnosis not present

## 2015-08-28 DIAGNOSIS — K219 Gastro-esophageal reflux disease without esophagitis: Secondary | ICD-10-CM | POA: Diagnosis not present

## 2015-08-28 DIAGNOSIS — Z79899 Other long term (current) drug therapy: Secondary | ICD-10-CM | POA: Diagnosis not present

## 2015-08-28 DIAGNOSIS — M1990 Unspecified osteoarthritis, unspecified site: Secondary | ICD-10-CM | POA: Diagnosis not present

## 2015-08-28 DIAGNOSIS — Y998 Other external cause status: Secondary | ICD-10-CM | POA: Insufficient documentation

## 2015-08-28 DIAGNOSIS — X58XXXA Exposure to other specified factors, initial encounter: Secondary | ICD-10-CM | POA: Insufficient documentation

## 2015-08-28 DIAGNOSIS — S8991XA Unspecified injury of right lower leg, initial encounter: Secondary | ICD-10-CM | POA: Diagnosis present

## 2015-08-28 NOTE — ED Notes (Signed)
Pt c/o sharp pain behind right knee 2 hour ago while walking. Pt denies injury.

## 2015-08-28 NOTE — Discharge Instructions (Signed)
Joint Sprain A sprain is a tear or stretch in the ligaments that hold a joint together. Severe sprains may need as long as 3-6 weeks of immobilization and/or exercises to heal completely. Sprained joints should be rested and protected. If not, they can become unstable and prone to re-injury. Proper treatment can reduce your pain, shorten the period of disability, and reduce the risk of repeated injuries. TREATMENT   Rest and elevate the injured joint to reduce pain and swelling.  Apply ice packs to the injury for 20-30 minutes every 2-3 hours for the next 2-3 days.  Keep the injury wrapped in a compression bandage or splint as long as the joint is painful or as instructed by your caregiver.  Do not use the injured joint until it is completely healed to prevent re-injury and chronic instability. Follow the instructions of your caregiver.  Long-term sprain management may require exercises and/or treatment by a physical therapist. Taping or special braces may help stabilize the joint until it is completely better. SEEK MEDICAL CARE IF:   You develop increased pain or swelling of the joint.  You develop increasing redness and warmth of the joint.  You develop a fever.  It becomes stiff.  Your hand or foot gets cold or numb. Document Released: 01/06/2005 Document Revised: 02/21/2012 Document Reviewed: 12/16/2008 [facility] Patient Information 2015 [place], [place]. This information is not intended to replace advice given to you by your health care provider. Make sure you discuss any questions you have with your health care provider.

## 2015-08-28 NOTE — ED Provider Notes (Signed)
CSN: [id number]     Arrival date & time 08/28/15  1356 History   First MD Initiated Contact with Patient 08/28/15 1540     Chief Complaint  Patient presents with  . Knee Pain     Patient is a 74 y.o. female presenting with knee pain. The history is provided by the patient.  Knee Pain Location:  Knee Pain details:    Quality:  Aching   Radiates to:  Does not radiate   Severity:  Moderate   Onset quality:  Sudden   Timing:  Constant   Progression:  Worsening Chronicity:  New Relieved by:  Rest Worsened by:  Bearing weight Associated symptoms: no back pain and no muscle weakness   pt was walking and had onset of right knee pain 2 hrs ago No fall/injury No weakness in leg No swelling to leg She reports pain is behind knee No h/o DVT  Past Medical History  Diagnosis Date  . Osteoporosis   . Hypertension   . GERD (gastroesophageal reflux disease)   . 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 Gallstones   . Wears glasses    Past Surgical History  Procedure Laterality Date  . Bladder repair  [phone]  . Carpal tunnel release  1992    right arm  . Rotator cuff repair  2004    two on right, one on left  . Arterial vascular fistula  2004    coiled at [facility]  . Trigger finger release      right hand  . Lumbar laminectomy/decompression microdiscectomy  11/30/2012    Procedure: LUMBAR LAMINECTOMY/DECOMPRESSION MICRODISCECTOMY 2 LEVELS;  Surgeon: Magnus Sinning, MD;  Location: [facility];  Service: Orthopedics;  Laterality: N/A;  DECOMPRESSION LAMINECTOMY L4-L5, L5-S1 CENTRAL   . Eus N/A 03/09/2013    Procedure: UPPER ENDOSCOPIC ULTRASOUND (EUS) LINEAR;  Surgeon: Beryle Beams, MD;  Location: [facility];  Service: Endoscopy;  Laterality: N/A;  . Abdominal hysterectomy  1983  . Cholecystectomy N/A 03/29/2013    Procedure: LAPAROSCOPIC CHOLECYSTECTOMY WITH INTRAOPERATIVE CHOLANGIOGRAM;  Surgeon: Haywood Lasso, MD;  Location: [place];  Service:  General;  Laterality: N/A;   Family History  Problem Relation Age of Onset  . Other Mother     sarcoma  . Other Father     broken hip   Social History  Substance Use Topics  . Smoking status: Former Smoker -- 0.50 packs/day    Types: Cigarettes    Quit date: 07/11/1970  . Smokeless tobacco: Never Used  . Alcohol Use: 0.0 oz/week    0 Standard drinks or equivalent per week     Comment: wine occasionally   OB History    No data available     Review of Systems  Musculoskeletal: Positive for arthralgias. Negative for back pain.  Neurological: Negative for weakness.      Allergies  Buprenorphine hcl; Codeine; and Morphine and related  Home Medications   Prior to Admission medications   Medication Sig Start Date End Date Taking? Authorizing Provider  buPROPion (WELLBUTRIN XL) 150 MG 24 hr tablet Take 150 mg by mouth daily.  07/25/15   Historical Provider, MD  cetirizine (ZYRTEC) 10 MG tablet Take 10 mg by mouth daily.    Historical Provider, MD  Cholecalciferol (VITAMIN D-3) 1000 UNITS CAPS Take 1,000 Units by mouth daily.     Historical Provider, MD  diphenhydramine-acetaminophen (TYLENOL PM) 25-500 MG TABS Take 1 tablet by mouth at  bedtime as needed (sleep).     Historical Provider, MD  DULoxetine (CYMBALTA) 60 MG capsule Take 60 mg by mouth daily.  07/17/15   Historical Provider, MD  hydrochlorothiazide (HYDRODIURIL) 25 MG tablet Take 25 mg by mouth daily.  03/19/15   Historical Provider, MD  meloxicam (MOBIC) 15 MG tablet Take 15 mg by mouth daily.    Historical Provider, MD  pantoprazole (PROTONIX) 40 MG tablet Take 40 mg by mouth 2 (two) times daily. 08/22/15   Historical Provider, MD  primidone (MYSOLINE) 50 MG tablet Take 2 tablets (100 mg total) by mouth 2 (two) times daily. Patient taking differently: Take 100 mg by mouth at bedtime.  08/21/14   Eustace Quail Tat, DO  Probiotic Product (PROBIOTIC DAILY PO) Take 1 tablet by mouth daily.     Historical Provider, MD  traMADol  (ULTRAM) 50 MG tablet Take 1 tablet (50 mg total) by mouth every 6 (six) hours as needed for severe pain. 08/11/15   Julianne Rice, MD  trimethoprim (TRIMPEX) 100 MG tablet Take 100 mg by mouth daily.  03/19/15   Historical Provider, MD  valsartan (DIOVAN) 160 MG tablet Take 160 mg by mouth daily.  07/17/15   Historical Provider, MD   BP 127/65 mmHg  Pulse 61  Temp(Src) 97.9 F (36.6 C) (Oral)  Resp 20  Ht 4\' 11"  (1.499 m)  Wt 208 lb (94.348 kg)  BMI 41.99 kg/m2  SpO2 98% Physical Exam CONSTITUTIONAL: Well developed/well nourished HEAD: Normocephalic/atraumatic EYES: EOMI ENMT: Mucous membranes moist NECK: supple no meningeal signs LUNGS: Lungs are clear to auscultation bilaterally, no apparent distress ABDOMEN: soft, nontender NEURO: Pt is awake/alert/appropriate, moves all extremitiesx4.  Pt is ambulatory EXTREMITIES: pulses normal/equal, full ROM, no tenderness to palpation of right knee or popliteal fossa, no warmth/thrill noted to popliteal fossa.  No LE edema.  No calf tenderness.  Pulses equal/intact.  She has pain in right knee only with ambulation.   SKIN: warm, color normal PSYCH: no abnormalities of mood noted, alert and oriented to situation  ED Course  Procedures Imaging Review Dg Knee Complete 4 Views Right  08/28/2015   CLINICAL DATA:  Posterior knee pain after walking today.  EXAM: RIGHT KNEE - COMPLETE 4+ VIEW  COMPARISON:  None  FINDINGS: Mild articular space narrowing in the medial compartment. Mild spurring along the tibial spine. Moderate loss of articular space in the patellofemoral joint with marginal spurring.  No acute bony findings.  IMPRESSION: 1. 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   Electronically Signed   By: Van Clines M.D.   On: 08/28/2015 14:48   I have personally reviewed and evaluated these images results as part of my medical decision-making.   MDM   Final diagnoses:  Sprain of right  knee, initial encounter    Nursing notes including past medical history and social history reviewed and considered in documentation xrays/imaging reviewed by myself and considered during evaluation  Pt declines pain meds She has arthritis by xray No signs of septic joint Doubt occult fx No signs of DVT Advised rest, use of walker which she has at home and sports med f/u     Ripley Fraise, MD 08/28/15 1612

## 2015-09-02 DIAGNOSIS — G250 Essential tremor: Secondary | ICD-10-CM | POA: Diagnosis not present

## 2015-09-02 DIAGNOSIS — Z4802 Encounter for removal of sutures: Secondary | ICD-10-CM | POA: Diagnosis not present

## 2015-09-02 DIAGNOSIS — S0101XD Laceration without foreign body of scalp, subsequent encounter: Secondary | ICD-10-CM | POA: Diagnosis not present

## 2015-09-02 DIAGNOSIS — Z6839 Body mass index (BMI) 39.0-39.9, adult: Secondary | ICD-10-CM | POA: Diagnosis not present

## 2015-09-04 DIAGNOSIS — M25522 Pain in left elbow: Secondary | ICD-10-CM | POA: Diagnosis not present

## 2015-09-04 DIAGNOSIS — M1711 Unilateral primary osteoarthritis, right knee: Secondary | ICD-10-CM | POA: Diagnosis not present

## 2015-09-06 ENCOUNTER — Ambulatory Visit (HOSPITAL_BASED_OUTPATIENT_CLINIC_OR_DEPARTMENT_OTHER)
Admission: RE | Admit: 2015-09-06 | Discharge: 2015-09-06 | Disposition: A | Discharge status: Home / Self Care | Payer: Medicare Other | Source: Ambulatory Visit | Attending: Internal Medicine | Admitting: Internal Medicine

## 2015-09-06 DIAGNOSIS — R269 Unspecified abnormalities of gait and mobility: Secondary | ICD-10-CM | POA: Diagnosis not present

## 2015-09-06 DIAGNOSIS — R2681 Unsteadiness on feet: Secondary | ICD-10-CM | POA: Insufficient documentation

## 2015-09-06 MED ORDER — GADOBENATE DIMEGLUMINE 529 MG/ML IV SOLN: 19.0000 mL | Freq: Once | INTRAVENOUS | Status: DC | PRN

## 2015-09-17 DIAGNOSIS — M6281 Muscle weakness (generalized): Secondary | ICD-10-CM | POA: Diagnosis not present

## 2015-09-17 DIAGNOSIS — R296 Repeated falls: Secondary | ICD-10-CM | POA: Diagnosis not present

## 2015-09-17 DIAGNOSIS — R262 Difficulty in walking, not elsewhere classified: Secondary | ICD-10-CM | POA: Diagnosis not present

## 2015-09-24 DIAGNOSIS — R296 Repeated falls: Secondary | ICD-10-CM | POA: Diagnosis not present

## 2015-09-24 DIAGNOSIS — R262 Difficulty in walking, not elsewhere classified: Secondary | ICD-10-CM | POA: Diagnosis not present

## 2015-09-24 DIAGNOSIS — M6281 Muscle weakness (generalized): Secondary | ICD-10-CM | POA: Diagnosis not present

## 2015-09-29 DIAGNOSIS — M6281 Muscle weakness (generalized): Secondary | ICD-10-CM | POA: Diagnosis not present

## 2015-09-29 DIAGNOSIS — R296 Repeated falls: Secondary | ICD-10-CM | POA: Diagnosis not present

## 2015-09-29 DIAGNOSIS — R262 Difficulty in walking, not elsewhere classified: Secondary | ICD-10-CM | POA: Diagnosis not present

## 2015-09-30 DIAGNOSIS — Z6841 Body Mass Index (BMI) 40.0 and over, adult: Secondary | ICD-10-CM | POA: Diagnosis not present

## 2015-09-30 DIAGNOSIS — K219 Gastro-esophageal reflux disease without esophagitis: Secondary | ICD-10-CM | POA: Diagnosis not present

## 2015-09-30 DIAGNOSIS — R5383 Other fatigue: Secondary | ICD-10-CM | POA: Diagnosis not present

## 2015-09-30 DIAGNOSIS — G3184 Mild cognitive impairment, so stated: Secondary | ICD-10-CM | POA: Diagnosis not present

## 2015-09-30 DIAGNOSIS — F329 Major depressive disorder, single episode, unspecified: Secondary | ICD-10-CM | POA: Diagnosis not present

## 2015-09-30 DIAGNOSIS — I10 Essential (primary) hypertension: Secondary | ICD-10-CM | POA: Diagnosis not present

## 2015-09-30 DIAGNOSIS — R61 Generalized hyperhidrosis: Secondary | ICD-10-CM | POA: Diagnosis not present

## 2015-09-30 DIAGNOSIS — G4733 Obstructive sleep apnea (adult) (pediatric): Secondary | ICD-10-CM | POA: Diagnosis not present

## 2015-09-30 DIAGNOSIS — G629 Polyneuropathy, unspecified: Secondary | ICD-10-CM | POA: Diagnosis not present

## 2015-09-30 DIAGNOSIS — R269 Unspecified abnormalities of gait and mobility: Secondary | ICD-10-CM | POA: Diagnosis not present

## 2015-10-01 ENCOUNTER — Telehealth: Payer: Self-pay | Admitting: Neurology

## 2015-10-01 DIAGNOSIS — R296 Repeated falls: Secondary | ICD-10-CM | POA: Diagnosis not present

## 2015-10-01 DIAGNOSIS — M6281 Muscle weakness (generalized): Secondary | ICD-10-CM | POA: Diagnosis not present

## 2015-10-01 DIAGNOSIS — R262 Difficulty in walking, not elsewhere classified: Secondary | ICD-10-CM | POA: Diagnosis not present

## 2015-10-01 NOTE — Telephone Encounter (Signed)
I spoke to patient and she reports extreme fatigue during the day. She spoke to her PCP about this and he asked for her to f/u with [name]. We had a patient cancel appt tomorrow and she is able to come at that time. I have printed CPAP download and called Dr. Raul Del office to have them fax [place] her office notes.

## 2015-10-01 NOTE — Telephone Encounter (Signed)
Patient called, was advised by PCP Dr. Lang Snow to see [name] for CPAP evaluation, to see how much air she is getting. She's been very sleepy and has been aroused too many times during the night. Patient would like appointment sooner than the next available opening.

## 2015-10-01 NOTE — Telephone Encounter (Signed)
Left message for patient to call back. Left message on home phone. No answer on cell number.

## 2015-10-02 ENCOUNTER — Encounter: Payer: Self-pay | Admitting: Neurology

## 2015-10-02 ENCOUNTER — Ambulatory Visit (INDEPENDENT_AMBULATORY_CARE_PROVIDER_SITE_OTHER): Payer: Medicare Other | Admitting: Neurology

## 2015-10-02 VITALS — BP 118/70 | HR 78 | Resp 20 | Ht 61.0 in | Wt 210.0 lb

## 2015-10-02 DIAGNOSIS — R400 Somnolence: Secondary | ICD-10-CM

## 2015-10-02 DIAGNOSIS — G250 Essential tremor: Secondary | ICD-10-CM

## 2015-10-02 DIAGNOSIS — G4710 Hypersomnia, unspecified: Secondary | ICD-10-CM | POA: Diagnosis not present

## 2015-10-02 DIAGNOSIS — G4733 Obstructive sleep apnea (adult) (pediatric): Secondary | ICD-10-CM | POA: Diagnosis not present

## 2015-10-02 DIAGNOSIS — Z9989 Dependence on other enabling machines and devices: Principal | ICD-10-CM

## 2015-10-02 DIAGNOSIS — E669 Obesity, unspecified: Secondary | ICD-10-CM

## 2015-10-02 MED ORDER — MODAFINIL 100 MG PO TABS: 100.0000 mg | ORAL_TABLET | Freq: Every day | ORAL | Status: DC | PRN

## 2015-10-02 NOTE — Progress Notes (Signed)
Subjective:    Patient ID: [name] is a 74 y.o. female.  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 Carles Collet), who presents for follow up consultation of her OSA, treated with CPAP. She is unaccompanied today. I last saw her on 04/14/2015, at which time she reported doing well on CPAP. She had improvement of her nocturia. She felt that her sleep was better consolidated. She was wearing about her husband health in his cognitive decline. She reported having 2 biological children and her husband had 8 children from his previous marriage. They were married for 25 years. Her tremor was fluctuating and generally speaking responsive to Mysoline but she was not able to increase the dose. She was getting too sleepy from the Mysoline. She was taking 2 pills at night only. She was reporting stress and seeing a therapist was helpful. She was trying to lose weight.   Today, 10/02/2015: I reviewed her CPAP compliance data from 09/01/2015 through 09/30/2015 which is a total of 30 days during which time she used her machine every night with percent used days greater than 4 hours at 100%, indicating superb compliance with an average usage of 8 hours and 44 minutes, residual AHI low at 1.5 per hour, leaked low at 5.6 L/m for the 95th percentile pressure of 6 cm with EPR.  Today, 10/02/2015: She reports that she has no problems with her CPAP machine. She is compliant with treatment. Her nocturia has become a little worse. She has had more daytime somnolence in the past couple of months or maybe longer. She is worried about the CPAP not working properly. She  has had some more stress, particularly with her husband's health issues. In August 2016 she saw Dr. Carles Collet for tremor follow-up and I reviewed the note. She was started on propranolol LA 80 mg once daily. The patient feels that the propranolol addition has helped her tremors. She saw her primary care physician on 09/30/2015 and talked about the increase in daytime somnolence. She was taken off of the Wellbutrin because it may have been causing her night sweats. She had some blood work at the time including TSH. Results are pending.  Previously:  I saw her on 10/10/2014, at which time she reported doing fairly well, with some residual daytime tiredness. She reported some stress. She was supposed to meet with a therapist soon. She has been on Cymbalta. She reported improved nocturia, and her sleep was more consolidated. She did have an increase in her Mysoline dose right around the same time she started CPAP therapy. She was fully compliant with treatment and I encouraged her to continue with treatment. We talked about the sleep test results at the time.  I reviewed her CPAP compliance data from 03/12/2015 through 04/10/2015 which is a total of 30 days during which time she used her machine every night with percent used days greater than 4 hours at 100%, indicating superb compliance, with an average usage of 8 hours and 12 minutes, pressure of 6 cm, residual AHI low at 1.6 per hour and leak low, with the 95th percentile of leak at 4 L/m   I first met her on  04/29/14, at the request of her PCP, at which time she reported excessive daytime somnolence, nocturia, and snoring, (per husband). I suggested she return for sleep study. She had a split-night sleep study on 06/05/2014 and I went over her test results with her in detail today. Her baseline sleep efficiency was 68.6% with a latency to sleep of 26 minutes and wake after sleep onset of 16.5 minutes with mild sleep fragmentation noted. She had an elevated arousal  index. She had an elevated percentage of light stage sleep, absence of deep sleep, and absence of REM sleep. She had no significant EEG changes and had rare PVCs and PVCs. She had a total AHI of 60.6 per hour. Baseline oxygen saturation was 90% with a nadir of 84%. She was titrated on CPAP. Sleep efficiency was improved during the treatment portion of the study. Arousal index was markedly improved. She achieved deep sleep and REM sleep during the treatment portion of the study. Average oxygen saturation was 91% with a nadir of 84%. Time below 88% saturation during the treatment portion of the study was 3 minutes and 35 seconds. She was titrated from 4-6 cm with a reduction of her AHI to 0.9 events at the final pressure. Supine REM sleep was achieved. Based on her sleep study results I ordered CPAP treatment for her.  I reviewed her compliance data from 08/20/2014 through 09/18/2014 which is a total of 30 days during which time she used her CPAP every night with percent used days greater than 4 hours at 100%, indicating superb compliance, average usage of 8 hours and 10 minutes, pressure at 6 cm with EPR of 3, residual AHI low at 1.9 per hour and leak was low at 4.3 L/m for the 95th percentile.  I reviewed her compliance data from 09/10/2014 through 10/09/2014 which is a total of 30 days during which time she was 100% compliant. Average usage of 8 hours and 17 minutes, residual AHI at 1.8 per hour, leak low. Pressure at 6 cm with EPR of 3.   She has been on Effexor XR 225 mg daily and tapered off of it over the course of a week and was started on Cymbalta 30 mg. She is retired as an Futures trader, but still works some. She has had some additional stressors. She endorses EDS and her ESS is 10/24. She has not fallen asleep driving. Her husband is 68 years old, and recently had eye surgery. She denies gasping for air and is not sure that she has witnessed apneas. She does not have morning headaches, but has  had some headaches since she has been on Mobic. She has nocturia x 2, which is better since the ABx. She drinks caffeine, but has cut back on her sodas to none after 2 PM. She likes Coke zero. She does not take a scheduled nap. She denies RLS symptoms, and is not sure if she kicks in her sleep. She goes to bed by 10 PM and likes to read on her Melanee Spry, and she is asleep by 11 PM. She has taken the occasional tylenol PM.   There is no report of nighttime reflux, now that she is on Zegerid. There Is family history of RLS in her mother and suspected OSA in her father, who also had a tremor.   She is not a very restless sleeper. She likes to sleep on her right side, but can sleep on her stomach and her back.   She denies cataplexy, sleep paralysis, hypnagogic  or hypnopompic hallucinations, or sleep attacks. She does not report any vivid dreams, nightmares, dream enactments, or parasomnias, such as sleep talking or sleep walking. The patient has not had a sleep study or a home sleep test. Her bedroom is usually dark and cool. There is a TV in the bedroom and usually it is not on at night.  Her Past Medical History Is Significant For: Past Medical History  Diagnosis Date  . Osteoporosis   . Hypertension   . GERD (gastroesophageal reflux disease)   . 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 Gallstones   . Wears glasses     Her Past Surgical History Is Significant For: Past Surgical History  Procedure Laterality Date  . Bladder repair  [phone]  . Carpal tunnel release  1992    right arm  . Rotator cuff repair  2004    two on right, one on left  . Arterial vascular fistula  2004    coiled at [facility]  . Trigger finger release      right hand  . Lumbar laminectomy/decompression microdiscectomy  11/30/2012    Procedure: LUMBAR LAMINECTOMY/DECOMPRESSION MICRODISCECTOMY 2 LEVELS;  Surgeon: Magnus Sinning, MD;  Location: [facility];  Service: Orthopedics;  Laterality: N/A;  DECOMPRESSION  LAMINECTOMY L4-L5, L5-S1 CENTRAL   . Eus N/A 03/09/2013    Procedure: UPPER ENDOSCOPIC ULTRASOUND (EUS) LINEAR;  Surgeon: Beryle Beams, MD;  Location: [facility];  Service: Endoscopy;  Laterality: N/A;  . Abdominal hysterectomy  1983  . Cholecystectomy N/A 03/29/2013    Procedure: LAPAROSCOPIC CHOLECYSTECTOMY WITH INTRAOPERATIVE CHOLANGIOGRAM;  Surgeon: Haywood Lasso, MD;  Location: [place];  Service: General;  Laterality: N/A;    Her Family History Is Significant For: Family History  Problem Relation Age of Onset  . Other Mother     sarcoma  . Other Father     broken hip    Her Social History Is Significant For: Social History   Social History  . Marital Status: Married    Spouse Name: Richard  . Number of Children: 2  . Years of Education: 19   Occupational History  . interior designer     partially retired   Social History Main Topics  . Smoking status: Former Smoker -- 0.50 packs/day    Types: Cigarettes    Quit date: 07/11/1970  . Smokeless tobacco: Never Used  . Alcohol Use: 0.0 oz/week    0 Standard drinks or equivalent per week     Comment: wine occasionally  . Drug Use: No  . Sexual Activity: Not Currently   Other Topics Concern  . None   Social History Narrative   Patient is right handed, resides with spouse in a home.   1 cup of coffee a day     Her Allergies Are:  Allergies  Allergen Reactions  . Buprenorphine Hcl Nausea And Vomiting  . Codeine Nausea And Vomiting  . Morphine And Related Nausea And Vomiting  :   Her Current Medications Are:  Outpatient Encounter Prescriptions as of 10/02/2015  Medication Sig  . celecoxib (CELEBREX) 200 MG capsule   . cetirizine (ZYRTEC) 10 MG tablet Take 10 mg by mouth daily.  . Cholecalciferol (VITAMIN D-3) 1000 UNITS CAPS Take 1,000 Units by mouth daily.   . DULoxetine (CYMBALTA) 60 MG capsule Take 60 mg by mouth daily.   Marland Kitchen esomeprazole (NEXIUM) 40 MG capsule   . primidone  (MYSOLINE) 50 MG tablet Take 2  tablets (100 mg total) by mouth 2 (two) times daily. (Patient taking differently: Take 100 mg by mouth at bedtime. )  . Probiotic Product (PROBIOTIC DAILY PO) Take 1 tablet by mouth daily.   . propranolol ER (INDERAL LA) 80 MG 24 hr capsule   . traMADol (ULTRAM) 50 MG tablet Take 1 tablet (50 mg total) by mouth every 6 (six) hours as needed for severe pain.  Marland Kitchen trimethoprim (TRIMPEX) 100 MG tablet Take 100 mg by mouth daily.   . valsartan (DIOVAN) 160 MG tablet Take 160 mg by mouth daily.   . modafinil (PROVIGIL) 100 MG tablet Take 1 tablet (100 mg total) by mouth daily as needed.  . [DISCONTINUED] buPROPion (WELLBUTRIN XL) 150 MG 24 hr tablet Take 150 mg by mouth daily.   . [DISCONTINUED] diphenhydramine-acetaminophen (TYLENOL PM) 25-500 MG TABS Take 1 tablet by mouth at bedtime as needed (sleep).   . [DISCONTINUED] hydrochlorothiazide (HYDRODIURIL) 25 MG tablet Take 25 mg by mouth daily.   . [DISCONTINUED] meloxicam (MOBIC) 15 MG tablet Take 15 mg by mouth daily.  . [DISCONTINUED] pantoprazole (PROTONIX) 40 MG tablet Take 40 mg by mouth 2 (two) times daily.   No facility-administered encounter medications on file as of 10/02/2015.  :  Review of Systems:  Out of a complete 14 point review of systems, all are reviewed and negative with the exception of these symptoms as listed below:   Review of Systems  Neurological:       Patient reports that recently when she gets up in the morning, she is very tired. Unless she has to be somewhere, she will fall asleep after breakfast for a couple of hours.   Wakes up startled during night with CPAP. Reports not sleep well with CPAP recently.     Objective:  Neurologic Exam  Physical Exam Physical Examination:   Filed Vitals:   10/02/15 0936  BP: 118/70  Pulse: 78  Resp: 20   General Examination: The patient is a very pleasant 74 y.o. female in no acute distress. She appears well-developed and well-nourished and  well groomed. She is overweight. She is in good spirits today.   HEENT: Normocephalic, atraumatic, pupils are equal, round and reactive to light and accommodation. Extraocular tracking is good without limitation to gaze excursion or nystagmus noted. Normal smooth pursuit is noted. Hearing is grossly intact. Face is symmetric with normal facial animation and normal facial sensation. 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 There is slight hypophonia. There is no lip, neck/head, jaw or voice tremor. Neck is supple with full range of passive and active motion. There are no carotid bruits on auscultation. Oropharynx exam reveals: moderate mouth dryness, adequate dental hygiene and moderate airway crowding, due to redundant soft palate and long but thin uvula. Mallampati is class II. Tongue protrudes centrally and palate elevates symmetrically. Tonsils are 1+ in size. She has a tiny overbite. Nasal inspection reveals no significant nasal mucosal bogginess or redness and no septal deviation.  Chest: Clear to auscultation without wheezing, rhonchi or crackles noted.  Heart: S1+S2+0, regular and normal without murmurs, rubs or gallops noted.   Abdomen: Soft, non-tender and non-distended with normal bowel sounds appreciated on auscultation.  Extremities: There is 1+ edema in the distal lower extremities bilaterally, left more than right, and she wears compression stockings bilaterally up to the knees. Pedal pulses are intact.  Skin: Warm and dry without trophic changes noted. There are no varicose veins.  Musculoskeletal: exam  reveals no obvious joint deformities, tenderness or joint swelling or erythema.   Neurologically:  Mental status: The patient is awake, alert and oriented in all 4 spheres. Her immediate and remote memory, attention, language skills and fund of knowledge are appropriate. There is no evidence of aphasia, agnosia, apraxia or anomia. Speech is clear with  normal prosody and enunciation. Thought process is linear. Mood is normal and affect is normal.  Cranial nerves II - XII are as described above under HEENT exam. In addition: shoulder shrug is normal with equal shoulder height noted. Motor exam: Normal bulk, strength and tone is noted. There is no drift, rest tremor or rebound. She has a very mild b/l UE postural and action tremor, left more than right. Romberg is negative. Reflexes are 2+ in the upper extremities and 1+ in the lower extremities. Fine motor skills and coordination: intact.  Cerebellar testing: No dysmetria or intention tremor on finger to nose testing. Heel to shin is unremarkable bilaterally. There is no truncal or gait ataxia. Sensory exam: intact to light touch.   Gait, station and balance: She stands easily. No veering to one side is noted. No leaning to one side is noted. Posture is age-appropriate and stance is narrow based. Gait shows normal stride length and normal pace. No problems turning are noted. She turns en bloc.   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 She is fully compliant. Her sleep apnea is under good control with her current settings. Leak from the mask is low. She presents for a sooner than scheduled appointment because of increase in daytime somnolence. We talked about her situation quite a bit today. We have to keep in mind that she is taking a few medications that could potentially contribute to daytime somnolence. While she had great results  with a combination of Mysoline and propranolol, both have a tendency to cause somnolence. She is on 80 mg of long-acting Inderal, and I suggested that she talk to Dr. Carles Collet next month when she has her follow-up appointment about potentially reducing this to 60 mg. She has been on the same dose of primidone for quite some time. Today, I suggested that we continue the CPAP therapy at the current settings. She is congratulated on her superb treatment adherence. She is advised that we can try her on a low-dose Provigil, 100 mg strength once daily as needed. I talked to her about this medication, its side effects and benefits. She is advised to try it on an as needed basis and she is made aware that it could cause increase in blood pressure, worsening of her tremors, and sometimes headaches and anxiety can flareup. Nevertheless, on an as-needed basis she may do fine with it. I provided her with a prescription and written instructions. She has an appointment pending with me for May next year and I asked her to keep it. She can follow-up as needed in the interim. I answered all her questions today and the patient was in agreement.  I spent 25 minutes in total face-to-face time with the patient, more than 50%  of which was spent in counseling and coordination of care, reviewing test results, reviewing medication and discussing or reviewing the diagnosis of OSA, daytime somnolence, ET, the prognosis and treatment options.

## 2015-10-02 NOTE — Patient Instructions (Addendum)
Your sleep apnea is well treated with CPAP, no need to increase pressure or change the settings.   While we could try you on a small dose of Provigil (generic name: modafinil) 100 mg: 1 pill once daily as needed. Avoid after 3 PM, as it can cause insomnia. Side effects include (but are not limited to): high blood pressure, headache, nervousness, palpitations, GI upset, tremor, anxiety.   I know, the primidone and propranolol have helped your tremor, but both can cause sleepiness. Please discuss with Dr. Carles Collet next month the possibility of reducing your propranolol to 60 mg daily longacting.   Look for a better BP machine, [employer] brand is usually very reliable.

## 2015-10-03 ENCOUNTER — Other Ambulatory Visit: Payer: Self-pay

## 2015-10-03 DIAGNOSIS — Z1231 Encounter for screening mammogram for malignant neoplasm of breast: Secondary | ICD-10-CM

## 2015-10-08 DIAGNOSIS — M6281 Muscle weakness (generalized): Secondary | ICD-10-CM | POA: Diagnosis not present

## 2015-10-08 DIAGNOSIS — R296 Repeated falls: Secondary | ICD-10-CM | POA: Diagnosis not present

## 2015-10-08 DIAGNOSIS — R262 Difficulty in walking, not elsewhere classified: Secondary | ICD-10-CM | POA: Diagnosis not present

## 2015-10-13 DIAGNOSIS — R296 Repeated falls: Secondary | ICD-10-CM | POA: Diagnosis not present

## 2015-10-13 DIAGNOSIS — M6281 Muscle weakness (generalized): Secondary | ICD-10-CM | POA: Diagnosis not present

## 2015-10-13 DIAGNOSIS — R262 Difficulty in walking, not elsewhere classified: Secondary | ICD-10-CM | POA: Diagnosis not present

## 2015-10-15 DIAGNOSIS — R262 Difficulty in walking, not elsewhere classified: Secondary | ICD-10-CM | POA: Diagnosis not present

## 2015-10-15 DIAGNOSIS — R296 Repeated falls: Secondary | ICD-10-CM | POA: Diagnosis not present

## 2015-10-15 DIAGNOSIS — M6281 Muscle weakness (generalized): Secondary | ICD-10-CM | POA: Diagnosis not present

## 2015-10-20 DIAGNOSIS — R262 Difficulty in walking, not elsewhere classified: Secondary | ICD-10-CM | POA: Diagnosis not present

## 2015-10-20 DIAGNOSIS — M6281 Muscle weakness (generalized): Secondary | ICD-10-CM | POA: Diagnosis not present

## 2015-10-20 DIAGNOSIS — R296 Repeated falls: Secondary | ICD-10-CM | POA: Diagnosis not present

## 2015-10-22 DIAGNOSIS — R296 Repeated falls: Secondary | ICD-10-CM | POA: Diagnosis not present

## 2015-10-22 DIAGNOSIS — M6281 Muscle weakness (generalized): Secondary | ICD-10-CM | POA: Diagnosis not present

## 2015-10-22 DIAGNOSIS — R262 Difficulty in walking, not elsewhere classified: Secondary | ICD-10-CM | POA: Diagnosis not present

## 2015-10-24 DIAGNOSIS — Z23 Encounter for immunization: Secondary | ICD-10-CM | POA: Diagnosis not present

## 2015-10-27 DIAGNOSIS — M6281 Muscle weakness (generalized): Secondary | ICD-10-CM | POA: Diagnosis not present

## 2015-10-27 DIAGNOSIS — R262 Difficulty in walking, not elsewhere classified: Secondary | ICD-10-CM | POA: Diagnosis not present

## 2015-10-27 DIAGNOSIS — R296 Repeated falls: Secondary | ICD-10-CM | POA: Diagnosis not present

## 2015-10-29 DIAGNOSIS — M6281 Muscle weakness (generalized): Secondary | ICD-10-CM | POA: Diagnosis not present

## 2015-10-29 DIAGNOSIS — N3020 Other chronic cystitis without hematuria: Secondary | ICD-10-CM | POA: Diagnosis not present

## 2015-10-29 DIAGNOSIS — R262 Difficulty in walking, not elsewhere classified: Secondary | ICD-10-CM | POA: Diagnosis not present

## 2015-10-29 DIAGNOSIS — N3946 Mixed incontinence: Secondary | ICD-10-CM | POA: Diagnosis not present

## 2015-10-29 DIAGNOSIS — R296 Repeated falls: Secondary | ICD-10-CM | POA: Diagnosis not present

## 2015-11-03 ENCOUNTER — Ambulatory Visit
Admission: RE | Admit: 2015-11-03 | Discharge: 2015-11-03 | Disposition: A | Discharge status: Home / Self Care | Payer: Medicare Other | Source: Ambulatory Visit

## 2015-11-03 DIAGNOSIS — M6281 Muscle weakness (generalized): Secondary | ICD-10-CM | POA: Diagnosis not present

## 2015-11-03 DIAGNOSIS — R296 Repeated falls: Secondary | ICD-10-CM | POA: Diagnosis not present

## 2015-11-03 DIAGNOSIS — R262 Difficulty in walking, not elsewhere classified: Secondary | ICD-10-CM | POA: Diagnosis not present

## 2015-11-03 DIAGNOSIS — Z1231 Encounter for screening mammogram for malignant neoplasm of breast: Secondary | ICD-10-CM

## 2015-11-04 ENCOUNTER — Ambulatory Visit (INDEPENDENT_AMBULATORY_CARE_PROVIDER_SITE_OTHER): Payer: Medicare Other | Admitting: Neurology

## 2015-11-04 ENCOUNTER — Encounter: Payer: Self-pay | Admitting: Neurology

## 2015-11-04 VITALS — BP 138/90 | HR 75 | Ht 60.0 in | Wt 214.0 lb

## 2015-11-04 DIAGNOSIS — R400 Somnolence: Secondary | ICD-10-CM

## 2015-11-04 DIAGNOSIS — Z9989 Dependence on other enabling machines and devices: Principal | ICD-10-CM

## 2015-11-04 DIAGNOSIS — R61 Generalized hyperhidrosis: Secondary | ICD-10-CM

## 2015-11-04 DIAGNOSIS — R296 Repeated falls: Secondary | ICD-10-CM

## 2015-11-04 DIAGNOSIS — G4733 Obstructive sleep apnea (adult) (pediatric): Secondary | ICD-10-CM

## 2015-11-04 DIAGNOSIS — G4710 Hypersomnia, unspecified: Secondary | ICD-10-CM | POA: Diagnosis not present

## 2015-11-04 DIAGNOSIS — G250 Essential tremor: Secondary | ICD-10-CM

## 2015-11-04 MED ORDER — PROPRANOLOL HCL 20 MG PO TABS: 20.0000 mg | ORAL_TABLET | Freq: Two times a day (BID) | ORAL | Status: DC

## 2015-11-04 NOTE — Progress Notes (Signed)
[name] was seen today in the movement disorders clinic for neurologic consultation at the request of Shirley Redwood, MD.  The consultation is for the evaluation of tremor.  The records that were made available to me were reviewed.  Pt is R hand dominant.  Pt reports tremor for 20-30 years, but was mild when it started.  It started in both hands.  Her father had the same thing, as did her sister and her son now has the same thing.  It didn't really start to bother her until 5-6 years ago.  She started on primidone at that time, 50 mg and it helped.  It was increased to 100 mg and it helped as well.  No SE with the medication.  Over the last few years, tremor has continued to pick up, and she is noticing it in the legs now as well.  She notices that anxiety/stress will increase the tremor.   In January, the patient ran out of her primidone for 4-5 days and she really noted a deterioration in the tremor.  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  She does not necessarily doze unexpectedly, but is very sleepy during the day.  She has an appointment with [name] to discuss this.   07/29/14 update:  Last visit, I increased the patients primidone to 50 mg in the AM and she is on 100 mg at night.  She is doing better in terms of tremor.  She has good days and bad days and she knows that stress makes it worse.  She did see [name] since last visit and I reviewed those notes.  She had a PSG.  She had an AHI of 60.6 but this was all in supine position and AHI in lateral position was 0.0 (but I was unsure of how much time, if any, was spent in the lateral position).  CPAP at 6 was recommended but she admits that she "hasn't given in to that" and wants to talk with Dr. [name].    01/29/15 update:  Pt returns today.  She had called and wanted to try to increase the primidone to 100 mg bid but it made her too sleepy.  She is therefore just on 2 at night.  She generally is doing  okay, but her husband is physically not well and that stress will increase tremor.  Husband with dementia and she is caregiving.  She is seeking out advice from psychologist.  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  The records that were made available to me were reviewed.  Less sleepy during the day with CPAP.    08/01/15 update:  The patient returns today for follow-up.  I have reviewed records since her last visit.  She has a history of essential tremor.  She is currently on primidone, 100 mg at night.  Daytime dosages have caused sleepiness.  She did follow-up with [name] on 04/14/2015 in regards to her sleep apnea.  She has been very compliant with this.  She reports that she is doing worse.  She states that she can hardly get food in the mouth at times.  She has no asthma.  She states that she has tried to relieve stress (primary caregiver for her husband who is ill) through painting and when she tried to sign the painting today, she couldn't do it.  She has never been on a beta blocker.  She does state  that she has been ill recently and is on an aquatic's, but she is not on any prednisone.  11/04/15 update:  The patient is following up today regarding her tremor.  I have reviewed records since our last visit.  She is on primidone, 100 mg at night.  Last visit, I cautiously started her on Inderal LA, 80 mg daily.  I told her to call me with any side effects or problems.  I have not heard from her, but it appears that she has been in the emergency room multiple times.  She went to the emergency room on 08/11/2015 after a fall on her arm.  She states that with this one she misstepped.  A CT of the brain was performed and was negative.   A few days later she twisted wrong in the kitchen and fell.   On 08/24/2015 she went back to the emergency room with another fall.  She fell in the kitchen and hit her head on the dishwasher.  Her scalp wound was repaired with sutures and staples and she was advised to discontinue  her Inderal.  Of note was that her pulse was 66 in the emergency room and blood pressure was 152/72.  She followed up with her primary care physician and had an MRI of the brain on 09/07/2015.  I had the opportunity to review this.  It was nonacute.  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  Since those falls, she has gone to PT and gone to the gym.  She loved the PT but she states that sx's are so erratic that she may not know it was going to occur.  She went back to the emergency room on 08/28/2015 with complaints of knee pain, after her right leg gave out while walking in a restaurant.  She had one other fall where she tripped over a throw rug at a friends house.  She attributes the last 2 falls to back pain.    She saw [name] on 10/02/2015 with excessive daytime hypersomnolence.  [name] interrogated her machine and she was very compliant with her CPAP and there was no significant mask leak.  [name] suggested perhaps decreasing her Inderal and also started her on Provigil, 100 mg daily.  The patient didn't try the provigil yet as she was worried about SE that [name] talked to her about.  Separate from all of this, she c/o sweats.  She is unsure of how long she has been on cymbalta or how long the sweats have been.  Her pharmacist suggested that the sweats may have been from propranolol.  ALLERGIES:   Allergies  Allergen Reactions  . Buprenorphine Hcl Nausea And Vomiting  . Codeine Nausea And Vomiting  . Morphine And Related Nausea And Vomiting    CURRENT MEDICATIONS:  Current Outpatient Prescriptions on File Prior to Visit  Medication Sig Dispense Refill  . celecoxib (CELEBREX) 200 MG capsule     . Cholecalciferol (VITAMIN D-3) 1000 UNITS CAPS Take 1,000 Units by mouth daily.     . DULoxetine (CYMBALTA) 60 MG capsule Take 60 mg by mouth daily.     Marland Kitchen esomeprazole (NEXIUM) 40 MG capsule     . primidone  (MYSOLINE) 50 MG tablet Take 2 tablets (100 mg total) by mouth 2 (two) times daily. (Patient taking differently: Take 100 mg by mouth at bedtime. ) 360 tablet 3  . propranolol ER (INDERAL LA)  80 MG 24 hr capsule Take 80 mg by mouth daily.     . traMADol (ULTRAM) 50 MG tablet Take 1 tablet (50 mg total) by mouth every 6 (six) hours as needed for severe pain. 15 tablet 0  . trimethoprim (TRIMPEX) 100 MG tablet Take 100 mg by mouth daily.     . valsartan (DIOVAN) 160 MG tablet Take 160 mg by mouth daily.      No current facility-administered medications on file prior to visit.    PAST MEDICAL HISTORY:   Past Medical History  Diagnosis Date  . Osteoporosis   . Hypertension   . GERD (gastroesophageal reflux disease)   . 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 Gallstones   . Wears glasses     PAST SURGICAL HISTORY:   Past Surgical History  Procedure Laterality Date  . Bladder repair  [phone]  . Carpal tunnel release  1992    right arm  . Rotator cuff repair  2004    two on right, one on left  . Arterial vascular fistula  2004    coiled at [facility]  . Trigger finger release      right hand  . Lumbar laminectomy/decompression microdiscectomy  11/30/2012    Procedure: LUMBAR LAMINECTOMY/DECOMPRESSION MICRODISCECTOMY 2 LEVELS;  Surgeon: Magnus Sinning, MD;  Location: [facility];  Service: Orthopedics;  Laterality: N/A;  DECOMPRESSION LAMINECTOMY L4-L5, L5-S1 CENTRAL   . Eus N/A 03/09/2013    Procedure: UPPER ENDOSCOPIC ULTRASOUND (EUS) LINEAR;  Surgeon: Beryle Beams, MD;  Location: [facility];  Service: Endoscopy;  Laterality: N/A;  . Abdominal hysterectomy  1983  . Cholecystectomy N/A 03/29/2013    Procedure: LAPAROSCOPIC CHOLECYSTECTOMY WITH INTRAOPERATIVE CHOLANGIOGRAM;  Surgeon: Haywood Lasso, MD;  Location: [facility];  Service: General;  Laterality: N/A;    SOCIAL HISTORY:   Social History   Social History  . Marital Status: Married    Spouse Name:  Richard  . Number of Children: 2  . Years of Education: 19   Occupational History  . interior designer     partially retired   Social History Main Topics  . Smoking status: Former Smoker -- 0.50 packs/day    Types: Cigarettes    Quit date: 07/11/1970  . Smokeless tobacco: Never Used  . Alcohol Use: 0.0 oz/week    0 Standard drinks or equivalent per week     Comment: wine occasionally  . Drug Use: No  . Sexual Activity: Not Currently   Other Topics Concern  . Not on file   Social History Narrative   Patient is right handed, resides with spouse in a home.   1 cup of coffee a day     FAMILY HISTORY:   Family Status  Relation Status Death Age  . Mother Deceased 71    sarcoma  . Father Deceased     complications from broken hip, ET  . Sister Deceased     blood clot, ET  . Son Alive     healthy, [place]  . Daughter Alive     healthy    ROS:  A complete 10 system review of systems was obtained and was unremarkable apart from what is mentioned above.  PHYSICAL EXAMINATION:    VITALS:   Filed Vitals:   11/04/15 1020  BP: 138/90  Pulse: 75  Height: 5' (1.524 m)  Weight: 214 lb (97.07 kg)    GEN:  The patient appears stated age and  is in NAD. HEENT:  Normocephalic, atraumatic.  The mucous membranes are moist. The superficial temporal arteries are without ropiness or tenderness. CV:  RRR Lungs:  CTAB Neck/HEME:  There are no carotid bruits bilaterally.  Neurological examination:  Orientation: The patient is alert and oriented x3.  Cranial nerves: There is good facial symmetry.  Extraocular muscles are intact. The visual fields are full to confrontational testing. The speech is fluent and clear. Soft palate rises symmetrically and there is no tongue deviation. Hearing is intact to conversational tone. Sensation: Sensation is intact to light and pinprick throughout (facial, trunk, extremities). Vibration is intact at the bilateral big toe. There is no extinction with  double simultaneous stimulation. There is no sensory dermatomal level identified. Motor: Strength is 5/5 in the bilateral upper and lower extremities.   Shoulder shrug is equal and symmetric.  There is no pronator drift.   Movement examination: Tone: There is normal tone in the bilateral upper extremities.  The tone in the lower extremities is normal.  Abnormal movements: There is minimal left hand rest tremor.  There is tremor of the outstretched hands that increases with intention bilaterally.  Coordination:  There is no significant decremation with RAM's, with any form of rapid alternating movement including alternating supination and pronation of the forearm, hand opening and closing, finger taps, heel taps and toe taps. Gait and Station: The patient has no significant difficulty arising out of a deep-seated chair without the use of the hands. The patient's stride length is good.    ASSESSMENT/PLAN:  1.  Essential tremor.  - She had difficulty with daytime hypersomnolence with daytime dosages of primidone.  She is on 100 mg at night.    -She has started falls since starting inderal LA, 80.  She is frustrated by the fact that tremor is well controlled so she is frustrated by the fact that I want to lower that.  Nonetheless, I am going to decrease it to propranolol, 20 mg twice a day.  If she continues to have falls, she is to call me.  I may stop the medication for a week or 2 and see how she does.  -ET pt education provided. 2.  Hx AVM repair with coiling  -She does have an upgoing toe on the right, which I think is likely just a result of her history of AVM, which she reports was on the left.  I see nothing else focal or lateralizing on her examination today, so decided to hold off on any further neuroimaging.   3.  OSAS  -On CPAP faithfully and doing well.  Still has EDS and seeing [name].  [name] gave her provigil but pt hasnt' tried it yet. 4.  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  -Admits that she has been somewhat more depressed lately, but that is primarily because of falls and sweating.  She is already seeing psychology.  Under more stress with caregiving for her husband who is ill.  She is not suicidal or homicidal.  Talked about coping mechanisms.   5.  Diaphoresis  -I wonder if related to cymbalta.  She is to try and figure out when this medication started and whether or not this correlated with time that started sweating.  Her pharmacist suggested that perhaps it was from the propranolol, but beta blockers can be used to treat hyperhidrosis, so I think it would be unusual that it caused it.  Regardless, I am backing down on the dose. 6.  I will plan on seeing her back in 4 months, sooner should new neurologic issues arise.  Much greater than 50% of this visit was spent in counseling with the patient .  Total face to face time:  30 min

## 2015-11-05 DIAGNOSIS — R296 Repeated falls: Secondary | ICD-10-CM | POA: Diagnosis not present

## 2015-11-05 DIAGNOSIS — R262 Difficulty in walking, not elsewhere classified: Secondary | ICD-10-CM | POA: Diagnosis not present

## 2015-11-05 DIAGNOSIS — M6281 Muscle weakness (generalized): Secondary | ICD-10-CM | POA: Diagnosis not present

## 2015-11-12 DIAGNOSIS — M6281 Muscle weakness (generalized): Secondary | ICD-10-CM | POA: Diagnosis not present

## 2015-11-12 DIAGNOSIS — R296 Repeated falls: Secondary | ICD-10-CM | POA: Diagnosis not present

## 2015-11-12 DIAGNOSIS — R350 Frequency of micturition: Secondary | ICD-10-CM | POA: Diagnosis not present

## 2015-11-12 DIAGNOSIS — R262 Difficulty in walking, not elsewhere classified: Secondary | ICD-10-CM | POA: Diagnosis not present

## 2015-11-12 DIAGNOSIS — N3946 Mixed incontinence: Secondary | ICD-10-CM | POA: Diagnosis not present

## 2015-11-12 DIAGNOSIS — N3020 Other chronic cystitis without hematuria: Secondary | ICD-10-CM | POA: Diagnosis not present

## 2015-11-14 ENCOUNTER — Telehealth: Payer: Self-pay | Admitting: Neurology

## 2015-11-14 NOTE — Telephone Encounter (Signed)
Left message on machine for patient to call back.

## 2015-11-14 NOTE — Telephone Encounter (Signed)
Spoke with patient and she states her tremors have increased dramatically since dropping Inderal dosage. She is no longer having falls. Please advise.

## 2015-11-14 NOTE — Telephone Encounter (Signed)
i am very nervous to increase it but we can try 20 mg tid and call her in 2 weeks

## 2015-11-14 NOTE — Telephone Encounter (Signed)
[facility] making patient aware of below instructions and to call if she has any questions.

## 2015-11-14 NOTE — Telephone Encounter (Signed)
PT called in regards to her medication Inderal and it not working/Dawn CB# [phone]

## 2015-11-17 DIAGNOSIS — R262 Difficulty in walking, not elsewhere classified: Secondary | ICD-10-CM | POA: Diagnosis not present

## 2015-11-17 DIAGNOSIS — R296 Repeated falls: Secondary | ICD-10-CM | POA: Diagnosis not present

## 2015-11-17 DIAGNOSIS — M6281 Muscle weakness (generalized): Secondary | ICD-10-CM | POA: Diagnosis not present

## 2015-11-19 DIAGNOSIS — R296 Repeated falls: Secondary | ICD-10-CM | POA: Diagnosis not present

## 2015-11-19 DIAGNOSIS — R262 Difficulty in walking, not elsewhere classified: Secondary | ICD-10-CM | POA: Diagnosis not present

## 2015-11-19 DIAGNOSIS — M6281 Muscle weakness (generalized): Secondary | ICD-10-CM | POA: Diagnosis not present

## 2015-11-24 DIAGNOSIS — R296 Repeated falls: Secondary | ICD-10-CM | POA: Diagnosis not present

## 2015-11-24 DIAGNOSIS — M6281 Muscle weakness (generalized): Secondary | ICD-10-CM | POA: Diagnosis not present

## 2015-11-24 DIAGNOSIS — R262 Difficulty in walking, not elsewhere classified: Secondary | ICD-10-CM | POA: Diagnosis not present

## 2015-11-26 DIAGNOSIS — R296 Repeated falls: Secondary | ICD-10-CM | POA: Diagnosis not present

## 2015-11-26 DIAGNOSIS — R262 Difficulty in walking, not elsewhere classified: Secondary | ICD-10-CM | POA: Diagnosis not present

## 2015-11-26 DIAGNOSIS — M6281 Muscle weakness (generalized): Secondary | ICD-10-CM | POA: Diagnosis not present

## 2015-11-28 ENCOUNTER — Telehealth: Payer: Self-pay | Admitting: Neurology

## 2015-11-28 NOTE — Telephone Encounter (Signed)
Spoke with patient and she states doing well on Inderal 20 mg TID. She usually just takes this BID but will take three times occasionally. She is having no falls. She has only had a couple episodes of sweating. She will stay on this dose and call with any problems.

## 2015-11-28 NOTE — Telephone Encounter (Signed)
Left message on machine for patient to call back. To see how she is doing on increase of Inderal? If she has had any falls or better tremor control. Awaiting call back to discuss.

## 2015-12-01 DIAGNOSIS — R262 Difficulty in walking, not elsewhere classified: Secondary | ICD-10-CM | POA: Diagnosis not present

## 2015-12-01 DIAGNOSIS — R296 Repeated falls: Secondary | ICD-10-CM | POA: Diagnosis not present

## 2015-12-01 DIAGNOSIS — M6281 Muscle weakness (generalized): Secondary | ICD-10-CM | POA: Diagnosis not present

## 2015-12-04 DIAGNOSIS — N3020 Other chronic cystitis without hematuria: Secondary | ICD-10-CM | POA: Diagnosis not present

## 2015-12-04 DIAGNOSIS — R350 Frequency of micturition: Secondary | ICD-10-CM | POA: Diagnosis not present

## 2015-12-10 DIAGNOSIS — G8929 Other chronic pain: Secondary | ICD-10-CM | POA: Diagnosis not present

## 2015-12-10 DIAGNOSIS — M1711 Unilateral primary osteoarthritis, right knee: Secondary | ICD-10-CM | POA: Diagnosis not present

## 2015-12-10 DIAGNOSIS — M25561 Pain in right knee: Secondary | ICD-10-CM | POA: Diagnosis not present

## 2015-12-10 DIAGNOSIS — M79604 Pain in right leg: Secondary | ICD-10-CM | POA: Diagnosis not present

## 2015-12-19 DIAGNOSIS — M4800 Spinal stenosis, site unspecified: Secondary | ICD-10-CM | POA: Diagnosis not present

## 2015-12-19 DIAGNOSIS — G4733 Obstructive sleep apnea (adult) (pediatric): Secondary | ICD-10-CM | POA: Diagnosis not present

## 2015-12-19 DIAGNOSIS — M7051 Other bursitis of knee, right knee: Secondary | ICD-10-CM | POA: Diagnosis not present

## 2015-12-19 DIAGNOSIS — Z6841 Body Mass Index (BMI) 40.0 and over, adult: Secondary | ICD-10-CM | POA: Diagnosis not present

## 2015-12-19 DIAGNOSIS — G250 Essential tremor: Secondary | ICD-10-CM | POA: Diagnosis not present

## 2015-12-19 DIAGNOSIS — Z1389 Encounter for screening for other disorder: Secondary | ICD-10-CM | POA: Diagnosis not present

## 2015-12-19 DIAGNOSIS — F329 Major depressive disorder, single episode, unspecified: Secondary | ICD-10-CM | POA: Diagnosis not present

## 2015-12-19 DIAGNOSIS — I10 Essential (primary) hypertension: Secondary | ICD-10-CM | POA: Diagnosis not present

## 2015-12-19 DIAGNOSIS — R7301 Impaired fasting glucose: Secondary | ICD-10-CM | POA: Diagnosis not present

## 2015-12-29 DIAGNOSIS — M6281 Muscle weakness (generalized): Secondary | ICD-10-CM | POA: Diagnosis not present

## 2015-12-29 DIAGNOSIS — R296 Repeated falls: Secondary | ICD-10-CM | POA: Diagnosis not present

## 2015-12-29 DIAGNOSIS — R262 Difficulty in walking, not elsewhere classified: Secondary | ICD-10-CM | POA: Diagnosis not present

## 2015-12-30 DIAGNOSIS — M5416 Radiculopathy, lumbar region: Secondary | ICD-10-CM | POA: Diagnosis not present

## 2015-12-30 DIAGNOSIS — M4316 Spondylolisthesis, lumbar region: Secondary | ICD-10-CM | POA: Diagnosis not present

## 2015-12-30 DIAGNOSIS — M5136 Other intervertebral disc degeneration, lumbar region: Secondary | ICD-10-CM | POA: Diagnosis not present

## 2015-12-30 DIAGNOSIS — M4726 Other spondylosis with radiculopathy, lumbar region: Secondary | ICD-10-CM | POA: Diagnosis not present

## 2015-12-31 ENCOUNTER — Other Ambulatory Visit: Payer: Self-pay | Admitting: Neurology

## 2015-12-31 MED ORDER — PRIMIDONE 50 MG PO TABS: 100.0000 mg | ORAL_TABLET | Freq: Every day | ORAL | Status: DC

## 2015-12-31 NOTE — Telephone Encounter (Signed)
Primidone refill requested. Per last office note- patient to remain on medication. Refill approved and sent to patient's pharmacy.   

## 2016-01-05 DIAGNOSIS — M6281 Muscle weakness (generalized): Secondary | ICD-10-CM | POA: Diagnosis not present

## 2016-01-05 DIAGNOSIS — R262 Difficulty in walking, not elsewhere classified: Secondary | ICD-10-CM | POA: Diagnosis not present

## 2016-01-05 DIAGNOSIS — R296 Repeated falls: Secondary | ICD-10-CM | POA: Diagnosis not present

## 2016-01-15 DIAGNOSIS — N3946 Mixed incontinence: Secondary | ICD-10-CM | POA: Diagnosis not present

## 2016-01-15 DIAGNOSIS — N3020 Other chronic cystitis without hematuria: Secondary | ICD-10-CM | POA: Diagnosis not present

## 2016-01-15 DIAGNOSIS — Z0000 Encounter for general adult medical examination without abnormal findings: Secondary | ICD-10-CM | POA: Diagnosis not present

## 2016-01-16 DIAGNOSIS — R296 Repeated falls: Secondary | ICD-10-CM | POA: Diagnosis not present

## 2016-01-16 DIAGNOSIS — M6281 Muscle weakness (generalized): Secondary | ICD-10-CM | POA: Diagnosis not present

## 2016-01-16 DIAGNOSIS — R262 Difficulty in walking, not elsewhere classified: Secondary | ICD-10-CM | POA: Diagnosis not present

## 2016-01-19 DIAGNOSIS — M6281 Muscle weakness (generalized): Secondary | ICD-10-CM | POA: Diagnosis not present

## 2016-01-19 DIAGNOSIS — R262 Difficulty in walking, not elsewhere classified: Secondary | ICD-10-CM | POA: Diagnosis not present

## 2016-01-19 DIAGNOSIS — R296 Repeated falls: Secondary | ICD-10-CM | POA: Diagnosis not present

## 2016-01-21 DIAGNOSIS — M6281 Muscle weakness (generalized): Secondary | ICD-10-CM | POA: Diagnosis not present

## 2016-01-21 DIAGNOSIS — R296 Repeated falls: Secondary | ICD-10-CM | POA: Diagnosis not present

## 2016-01-21 DIAGNOSIS — R262 Difficulty in walking, not elsewhere classified: Secondary | ICD-10-CM | POA: Diagnosis not present

## 2016-01-26 DIAGNOSIS — R296 Repeated falls: Secondary | ICD-10-CM | POA: Diagnosis not present

## 2016-01-26 DIAGNOSIS — R262 Difficulty in walking, not elsewhere classified: Secondary | ICD-10-CM | POA: Diagnosis not present

## 2016-01-26 DIAGNOSIS — M6281 Muscle weakness (generalized): Secondary | ICD-10-CM | POA: Diagnosis not present

## 2016-01-28 DIAGNOSIS — G629 Polyneuropathy, unspecified: Secondary | ICD-10-CM | POA: Diagnosis not present

## 2016-01-28 DIAGNOSIS — W19XXXA Unspecified fall, initial encounter: Secondary | ICD-10-CM | POA: Diagnosis not present

## 2016-01-28 DIAGNOSIS — G250 Essential tremor: Secondary | ICD-10-CM | POA: Diagnosis not present

## 2016-01-28 DIAGNOSIS — G3184 Mild cognitive impairment, so stated: Secondary | ICD-10-CM | POA: Diagnosis not present

## 2016-01-28 DIAGNOSIS — M4800 Spinal stenosis, site unspecified: Secondary | ICD-10-CM | POA: Diagnosis not present

## 2016-01-28 DIAGNOSIS — G4733 Obstructive sleep apnea (adult) (pediatric): Secondary | ICD-10-CM | POA: Diagnosis not present

## 2016-01-28 DIAGNOSIS — Z6841 Body Mass Index (BMI) 40.0 and over, adult: Secondary | ICD-10-CM | POA: Diagnosis not present

## 2016-01-28 DIAGNOSIS — R2689 Other abnormalities of gait and mobility: Secondary | ICD-10-CM | POA: Diagnosis not present

## 2016-02-02 DIAGNOSIS — R262 Difficulty in walking, not elsewhere classified: Secondary | ICD-10-CM | POA: Diagnosis not present

## 2016-02-02 DIAGNOSIS — M6281 Muscle weakness (generalized): Secondary | ICD-10-CM | POA: Diagnosis not present

## 2016-02-02 DIAGNOSIS — R296 Repeated falls: Secondary | ICD-10-CM | POA: Diagnosis not present

## 2016-02-03 DIAGNOSIS — M4726 Other spondylosis with radiculopathy, lumbar region: Secondary | ICD-10-CM | POA: Diagnosis not present

## 2016-02-03 DIAGNOSIS — I739 Peripheral vascular disease, unspecified: Secondary | ICD-10-CM | POA: Diagnosis not present

## 2016-02-03 DIAGNOSIS — M4316 Spondylolisthesis, lumbar region: Secondary | ICD-10-CM | POA: Diagnosis not present

## 2016-02-03 DIAGNOSIS — M5136 Other intervertebral disc degeneration, lumbar region: Secondary | ICD-10-CM | POA: Diagnosis not present

## 2016-02-04 ENCOUNTER — Ambulatory Visit (INDEPENDENT_AMBULATORY_CARE_PROVIDER_SITE_OTHER): Payer: Medicare Other | Admitting: Neurology

## 2016-02-04 ENCOUNTER — Encounter: Payer: Self-pay | Admitting: Neurology

## 2016-02-04 ENCOUNTER — Telehealth: Payer: Self-pay | Admitting: Neurology

## 2016-02-04 VITALS — BP 138/74 | HR 78 | Ht 60.0 in | Wt 216.0 lb

## 2016-02-04 DIAGNOSIS — R296 Repeated falls: Secondary | ICD-10-CM

## 2016-02-04 DIAGNOSIS — R61 Generalized hyperhidrosis: Secondary | ICD-10-CM

## 2016-02-04 DIAGNOSIS — G250 Essential tremor: Secondary | ICD-10-CM

## 2016-02-04 DIAGNOSIS — G609 Hereditary and idiopathic neuropathy, unspecified: Secondary | ICD-10-CM

## 2016-02-04 DIAGNOSIS — F331 Major depressive disorder, recurrent, moderate: Secondary | ICD-10-CM

## 2016-02-04 NOTE — Telephone Encounter (Signed)
Left message on machine for patient to call back.

## 2016-02-04 NOTE — Patient Instructions (Signed)
1.  You call Dr. [name] and talk with him about your cymbalta and sweating as well as your decisions on neurologists here or in [place].  I also want you to decide on whether or not you want to proceed with EMG testing for possible peripheral neuropathy, whether here or in [place].

## 2016-02-04 NOTE — Progress Notes (Signed)
[name] was seen today in the movement disorders clinic for neurologic consultation at the request of [name], MD.  The consultation is for the evaluation of tremor.  The records that were made available to me were reviewed.  Pt is R hand dominant.  Pt reports tremor for 20-30 years, but was mild when it started.  It started in both hands.  Her father had the same thing, as did her sister and her son now has the same thing.  It didn't really start to bother her until 5-6 years ago.  She started on primidone at that time, 50 mg and it helped.  It was increased to 100 mg and it helped as well.  No SE with the medication.  Over the last few years, tremor has continued to pick up, and she is noticing it in the legs now as well.  She notices that anxiety/stress will increase the tremor.   In January, the patient ran out of her primidone for 4-5 days and she really noted a deterioration in the tremor.  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  She does not necessarily doze unexpectedly, but is very sleepy during the day.  She has an appointment with [name] to discuss this.   07/29/14 update:  Last visit, I increased the patients primidone to 50 mg in the AM and she is on 100 mg at night.  She is doing better in terms of tremor.  She has good days and bad days and she knows that stress makes it worse.  She did see [name] since last visit and I reviewed those notes.  She had a PSG.  She had an AHI of 60.6 but this was all in supine position and AHI in lateral position was 0.0 (but I was unsure of how much time, if any, was spent in the lateral position).  CPAP at 6 was recommended but she admits that she "hasn't given in to that" and wants to talk with Dr. [name].    01/29/15 update:  Pt returns today.  She had called and wanted to try to increase the primidone to 100 mg bid but it made her too sleepy.  She is therefore just on 2 at night.  She generally is doing  okay, but her husband is physically not well and that stress will increase tremor.  Husband with dementia and she is caregiving.  She is seeking out advice from psychologist.  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  The records that were made available to me were reviewed.  Less sleepy during the day with CPAP.    08/01/15 update:  The patient returns today for follow-up.  I have reviewed records since her last visit.  She has a history of essential tremor.  She is currently on primidone, 100 mg at night.  Daytime dosages have caused sleepiness.  She did follow-up with [name] on 04/14/2015 in regards to her sleep apnea.  She has been very compliant with this.  She reports that she is doing worse.  She states that she can hardly get food in the mouth at times.  She has no asthma.  She states that she has tried to relieve stress (primary caregiver for her husband who is ill) through painting and when she tried to sign the painting today, she couldn't do it.  She has never been on a beta blocker.  She does state  that she has been ill recently and is on an aquatic's, but she is not on any prednisone.  11/04/15 update:  The patient is following up today regarding her tremor.  I have reviewed records since our last visit.  She is on primidone, 100 mg at night.  Last visit, I cautiously started her on Inderal LA, 80 mg daily.  I told her to call me with any side effects or problems.  I have not heard from her, but it appears that she has been in the emergency room multiple times.  She went to the emergency room on 08/11/2015 after a fall on her arm.  She states that with this one she misstepped.  A CT of the brain was performed and was negative.   A few days later she twisted wrong in the kitchen and fell.   On 08/24/2015 she went back to the emergency room with another fall.  She fell in the kitchen and hit her head on the dishwasher.  Her scalp wound was repaired with sutures and staples and she was advised to discontinue  her Inderal.  Of note was that her pulse was 66 in the emergency room and blood pressure was 152/72.  She followed up with her primary care physician and had an MRI of the brain on 09/07/2015.  I had the opportunity to review this.  It was nonacute.  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  Since those falls, she has gone to PT and gone to the gym.  She loved the PT but she states that sx's are so erratic that she may not know it was going to occur.  She went back to the emergency room on 08/28/2015 with complaints of knee pain, after her right leg gave out while walking in a restaurant.  She had one other fall where she tripped over a throw rug at a friends house.  She attributes the last 2 falls to back pain.    She saw [name] on 10/02/2015 with excessive daytime hypersomnolence.  [name] interrogated her machine and she was very compliant with her CPAP and there was no significant mask leak.  [name] suggested perhaps decreasing her Inderal and also started her on Provigil, 100 mg daily.  The patient didn't try the provigil yet as she was worried about SE that [name] talked to her about.  Separate from all of this, she c/o sweats.  She is unsure of how long she has been on cymbalta or how long the sweats have been.  Her pharmacist suggested that the sweats may have been from propranolol.  02/04/16 update:  The patient follows up today for essential tremor.  She remains on primidone, 100 mg at night.  Daytime dosages have made her sleepy, so she is only on nighttime dosages.  Last visit, I changed her from Inderal LA, 80 mg to propranolol 20 mg twice a day, primarily because she started falling after I added the Inderal LA.  She called me not long after I did that to let me know that while she was no longer falling, she could not tolerate the tremor and wanted to go back up on the medication.  I was very leery about this, but  we ultimately told her she can go up to 20 mg 3 times a day.  We called her shortly thereafter and she stated that she was only doing 3 times a  day as needed.  She states today, however, that she is only taking 20 mg at night unless she has a "lunch date" and then she may take an extra during the day.   Last visit, she was also complaining about significant diaphoresis and I told her that it could be the Cymbalta and told her that perhaps she could discuss this with her prescribing physician.  She actually thinks that lowering the propranolol helped the sweating but she is still sweating (but states that weather is bit cooler).  Unfortunately, despite the change in medication, she has had a few more falls.  The last fall was a week ago.  She had her cane and was coming out of the store and she "stubbed" her foot and fell.  No dizziness associated with the falls.  She is in her 2nd session of PT in [place].    ALLERGIES:   Allergies  Allergen Reactions  . Buprenorphine Hcl Nausea And Vomiting  . Codeine Nausea And Vomiting  . Morphine And Related Nausea And Vomiting    CURRENT MEDICATIONS:  Current Outpatient Prescriptions on File Prior to Visit  Medication Sig Dispense Refill  . celecoxib (CELEBREX) 200 MG capsule     . Cholecalciferol (VITAMIN D-3) 1000 UNITS CAPS Take 1,000 Units by mouth daily.     . DULoxetine (CYMBALTA) 60 MG capsule Take 60 mg by mouth daily.     Marland Kitchen esomeprazole (NEXIUM) 40 MG capsule     . primidone (MYSOLINE) 50 MG tablet Take 2 tablets (100 mg total) by mouth at bedtime. 180 tablet 1  . propranolol (INDERAL) 20 MG tablet Take 1 tablet (20 mg total) by mouth 2 (two) times daily. (Patient taking differently: Take 20 mg by mouth at bedtime. ) 60 tablet 5  . traMADol (ULTRAM) 50 MG tablet Take 1 tablet (50 mg total) by mouth every 6 (six) hours as needed for severe pain. 15 tablet 0  . trimethoprim (TRIMPEX) 100 MG tablet Take 100 mg by mouth daily.     . valsartan  (DIOVAN) 160 MG tablet Take 160 mg by mouth daily.      No current facility-administered medications on file prior to visit.    PAST MEDICAL HISTORY:   Past Medical History  Diagnosis Date  . Osteoporosis   . Hypertension   . GERD (gastroesophageal reflux disease)   . 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 Gallstones   . Wears glasses     PAST SURGICAL HISTORY:   Past Surgical History  Procedure Laterality Date  . Bladder repair  [phone]  . Carpal tunnel release  1992    right arm  . Rotator cuff repair  2004    two on right, one on left  . Arterial vascular fistula  2004    coiled at [facility]  . Trigger finger release      right hand  . Lumbar laminectomy/decompression microdiscectomy  11/30/2012    Procedure: LUMBAR LAMINECTOMY/DECOMPRESSION MICRODISCECTOMY 2 LEVELS;  Surgeon: Magnus Sinning, MD;  Location: [facility];  Service: Orthopedics;  Laterality: N/A;  DECOMPRESSION LAMINECTOMY L4-L5, L5-S1 CENTRAL   . Eus N/A 03/09/2013    Procedure: UPPER ENDOSCOPIC ULTRASOUND (EUS) LINEAR;  Surgeon: Beryle Beams, MD;  Location: [facility];  Service: Endoscopy;  Laterality: N/A;  . Abdominal hysterectomy  1983  . Cholecystectomy N/A 03/29/2013    Procedure: LAPAROSCOPIC CHOLECYSTECTOMY WITH INTRAOPERATIVE CHOLANGIOGRAM;  Surgeon: Haywood Lasso, MD;  Location: [facility];  Service: General;  Laterality: N/A;    SOCIAL HISTORY:   Social History   Social History  . Marital Status: Married    Spouse Name: Richard  . Number of Children: 2  . Years of Education: 19   Occupational History  . interior designer     partially retired   Social History Main Topics  . Smoking status: Former Smoker -- 0.50 packs/day    Types: Cigarettes    Quit date: 07/11/1970  . Smokeless tobacco: Never Used  . Alcohol Use: 0.0 oz/week    0 Standard drinks or equivalent per week     Comment: wine occasionally  . Drug Use: No  . Sexual Activity: Not Currently    Other Topics Concern  . Not on file   Social History Narrative   Patient is right handed, resides with spouse in a home.   1 cup of coffee a day     FAMILY HISTORY:   Family Status  Relation Status Death Age  . Mother Deceased 52    sarcoma  . Father Deceased     complications from broken hip, ET  . Sister Deceased     blood clot, ET  . Son Alive     healthy,   . Daughter Alive     healthy    ROS:  A complete 10 system review of systems was obtained and was unremarkable apart from what is mentioned above.  PHYSICAL EXAMINATION:    VITALS:   Filed Vitals:   02/04/16 0928  BP: 138/74  Pulse: 78  Height: 5' (1.524 m)  Weight: 216 lb (97.977 kg)    GEN:  The patient appears stated age and is in NAD. HEENT:  Normocephalic, atraumatic.  The mucous membranes are moist. The superficial temporal arteries are without ropiness or tenderness. CV:  RRR Lungs:  CTAB Neck/HEME:  There are no carotid bruits bilaterally.  Neurological examination:  Orientation: The patient is alert and oriented x3.  Cranial nerves: There is good facial symmetry.  Extraocular muscles are intact. The visual fields are full to confrontational testing. The speech is fluent and clear. Soft palate rises symmetrically and there is no tongue deviation. Hearing is intact to conversational tone. Sensation: Sensation is intact to light and pinprick throughout (facial, trunk, extremities). Vibration is decreased at the bilateral big toe. There is no extinction with double simultaneous stimulation. There is no sensory dermatomal level identified. Motor: Strength is 5/5 in the bilateral upper and lower extremities.   Shoulder shrug is equal and symmetric.  There is no pronator drift.   Movement examination: Tone: There is normal tone in the bilateral upper extremities.  The tone in the lower extremities is normal.  Abnormal movements: There is minimal left hand rest tremor.  There is tremor of the  outstretched hands that increases with intention bilaterally.  Coordination:  There is no significant decremation with RAM's, with any form of rapid alternating movement including alternating supination and pronation of the forearm, hand opening and closing, finger taps, heel taps and toe taps. Gait and Station: The patient has no significant difficulty arising out of a deep-seated chair without the use of the hands. The patient's stride length is good.    ASSESSMENT/PLAN:  1.  Essential tremor.  - She had difficulty with daytime hypersomnolence with daytime dosages of primidone.  She is on 100 mg at night.    -Initially, we thought that her falls were related to Inderal LA, 80 mg  daily.  She is now only on Inderal, 20 mg daily at night and continues to have falls.  I do not think the falls are related to Inderal, nor does she.  She does not necessarily want to go back up on the Inderal right now, however.  -talked about weighted spoons/forks.  She wants to investigate these things further before I write a prescription for these.  -showed her the video on [employer] but it costs $295  -told her that I saw no evidence of parkinsons disease 2.  Falls  -doesn't appear to be associated with medications  -has evidence of peripheral neuropathy.  Talked to her about EMG.  She wants to think about this as her family is encouraging her to go to a neurologist in [facility].  I actually gave her names of neurologist in [place].    -she is already in physical therapy 2.  Hx AVM repair with coiling  -She does have an upgoing toe on the right, which I think is likely just a result of her history of AVM, which she reports was on the left.  I see nothing else focal or lateralizing on her examination today, so decided to hold off on any further neuroimaging.   3.  OSAS  -On CPAP faithfully and doing well.  Still has EDS and seeing [name].  [name] gave her provigil but pt hasnt' tried it yet. 4.  Anxiety  and depression, related to caregiving.  -Admits that she has been somewhat more depressed lately, but that is primarily because of falls and sweating.  She is already seeing psychology.  Under more stress with caregiving for her husband who is ill.  She is not suicidal or homicidal.  Talked about coping mechanisms.    -worries about dementia but think depression is just playing a major role.  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  Talked to her about neuropsych testing.  Told her that we will likely have a neuropsychologist in our office, or I can refer her, as she was worried about this.  Again, she is going to think about this and talk about it with her primary care physician and her family. 5.  Diaphoresis  -I wonder if related to cymbalta.  She is going to talk about this with her primary care physician 6.  She will let me know if she wants to follow-up here.  As above, her family is encouraging her to find a neurologist in [facility] and I actually gave her the names of some.  Much greater than 50% of this 45 minute visit was spent in counseling and coordinating care.

## 2016-02-04 NOTE — Telephone Encounter (Signed)
PT called and would like a call back in regards to EMG/Dawn CB# [phone]

## 2016-02-05 DIAGNOSIS — R296 Repeated falls: Secondary | ICD-10-CM | POA: Diagnosis not present

## 2016-02-05 DIAGNOSIS — R262 Difficulty in walking, not elsewhere classified: Secondary | ICD-10-CM | POA: Diagnosis not present

## 2016-02-05 DIAGNOSIS — M6281 Muscle weakness (generalized): Secondary | ICD-10-CM | POA: Diagnosis not present

## 2016-02-05 NOTE — Telephone Encounter (Signed)
Called patient again. She would like to proceed with EMG and have workup by Dr Tat. Order entered and given to front desk to schedule.

## 2016-02-09 DIAGNOSIS — M6281 Muscle weakness (generalized): Secondary | ICD-10-CM | POA: Diagnosis not present

## 2016-02-09 DIAGNOSIS — R296 Repeated falls: Secondary | ICD-10-CM | POA: Diagnosis not present

## 2016-02-09 DIAGNOSIS — R262 Difficulty in walking, not elsewhere classified: Secondary | ICD-10-CM | POA: Diagnosis not present

## 2016-02-10 DIAGNOSIS — M5136 Other intervertebral disc degeneration, lumbar region: Secondary | ICD-10-CM | POA: Diagnosis not present

## 2016-02-10 DIAGNOSIS — I739 Peripheral vascular disease, unspecified: Secondary | ICD-10-CM | POA: Diagnosis not present

## 2016-02-10 DIAGNOSIS — M4726 Other spondylosis with radiculopathy, lumbar region: Secondary | ICD-10-CM | POA: Diagnosis not present

## 2016-02-10 DIAGNOSIS — M4807 Spinal stenosis, lumbosacral region: Secondary | ICD-10-CM | POA: Diagnosis not present

## 2016-02-10 DIAGNOSIS — M4316 Spondylolisthesis, lumbar region: Secondary | ICD-10-CM | POA: Diagnosis not present

## 2016-02-11 DIAGNOSIS — M6281 Muscle weakness (generalized): Secondary | ICD-10-CM | POA: Diagnosis not present

## 2016-02-11 DIAGNOSIS — R296 Repeated falls: Secondary | ICD-10-CM | POA: Diagnosis not present

## 2016-02-11 DIAGNOSIS — R262 Difficulty in walking, not elsewhere classified: Secondary | ICD-10-CM | POA: Diagnosis not present

## 2016-02-16 DIAGNOSIS — M1711 Unilateral primary osteoarthritis, right knee: Secondary | ICD-10-CM | POA: Diagnosis not present

## 2016-02-16 DIAGNOSIS — R269 Unspecified abnormalities of gait and mobility: Secondary | ICD-10-CM | POA: Diagnosis not present

## 2016-02-16 DIAGNOSIS — M545 Low back pain: Secondary | ICD-10-CM | POA: Diagnosis not present

## 2016-02-16 DIAGNOSIS — M6281 Muscle weakness (generalized): Secondary | ICD-10-CM | POA: Diagnosis not present

## 2016-02-18 DIAGNOSIS — R269 Unspecified abnormalities of gait and mobility: Secondary | ICD-10-CM | POA: Diagnosis not present

## 2016-02-18 DIAGNOSIS — M545 Low back pain: Secondary | ICD-10-CM | POA: Diagnosis not present

## 2016-02-18 DIAGNOSIS — M1711 Unilateral primary osteoarthritis, right knee: Secondary | ICD-10-CM | POA: Diagnosis not present

## 2016-02-18 DIAGNOSIS — M6281 Muscle weakness (generalized): Secondary | ICD-10-CM | POA: Diagnosis not present

## 2016-02-20 DIAGNOSIS — M4726 Other spondylosis with radiculopathy, lumbar region: Secondary | ICD-10-CM | POA: Diagnosis not present

## 2016-02-20 DIAGNOSIS — M4316 Spondylolisthesis, lumbar region: Secondary | ICD-10-CM | POA: Diagnosis not present

## 2016-02-20 DIAGNOSIS — M5136 Other intervertebral disc degeneration, lumbar region: Secondary | ICD-10-CM | POA: Diagnosis not present

## 2016-02-20 DIAGNOSIS — M48061 Spinal stenosis, lumbar region without neurogenic claudication: Secondary | ICD-10-CM | POA: Insufficient documentation

## 2016-02-20 DIAGNOSIS — M4806 Spinal stenosis, lumbar region: Secondary | ICD-10-CM | POA: Diagnosis not present

## 2016-02-23 DIAGNOSIS — M1711 Unilateral primary osteoarthritis, right knee: Secondary | ICD-10-CM | POA: Diagnosis not present

## 2016-02-23 DIAGNOSIS — M6281 Muscle weakness (generalized): Secondary | ICD-10-CM | POA: Diagnosis not present

## 2016-02-23 DIAGNOSIS — R269 Unspecified abnormalities of gait and mobility: Secondary | ICD-10-CM | POA: Diagnosis not present

## 2016-02-23 DIAGNOSIS — M545 Low back pain: Secondary | ICD-10-CM | POA: Diagnosis not present

## 2016-02-24 ENCOUNTER — Encounter: Payer: Medicare Other | Admitting: Neurology

## 2016-02-25 DIAGNOSIS — M6281 Muscle weakness (generalized): Secondary | ICD-10-CM | POA: Diagnosis not present

## 2016-02-25 DIAGNOSIS — M1711 Unilateral primary osteoarthritis, right knee: Secondary | ICD-10-CM | POA: Diagnosis not present

## 2016-02-25 DIAGNOSIS — M545 Low back pain: Secondary | ICD-10-CM | POA: Diagnosis not present

## 2016-02-25 DIAGNOSIS — R269 Unspecified abnormalities of gait and mobility: Secondary | ICD-10-CM | POA: Diagnosis not present

## 2016-03-01 DIAGNOSIS — M545 Low back pain: Secondary | ICD-10-CM | POA: Diagnosis not present

## 2016-03-01 DIAGNOSIS — M1711 Unilateral primary osteoarthritis, right knee: Secondary | ICD-10-CM | POA: Diagnosis not present

## 2016-03-01 DIAGNOSIS — M6281 Muscle weakness (generalized): Secondary | ICD-10-CM | POA: Diagnosis not present

## 2016-03-01 DIAGNOSIS — R269 Unspecified abnormalities of gait and mobility: Secondary | ICD-10-CM | POA: Diagnosis not present

## 2016-03-02 DIAGNOSIS — I739 Peripheral vascular disease, unspecified: Secondary | ICD-10-CM | POA: Diagnosis not present

## 2016-03-02 DIAGNOSIS — M4726 Other spondylosis with radiculopathy, lumbar region: Secondary | ICD-10-CM | POA: Diagnosis not present

## 2016-03-02 DIAGNOSIS — M4316 Spondylolisthesis, lumbar region: Secondary | ICD-10-CM | POA: Diagnosis not present

## 2016-03-02 DIAGNOSIS — M4806 Spinal stenosis, lumbar region: Secondary | ICD-10-CM | POA: Diagnosis not present

## 2016-03-02 DIAGNOSIS — M5136 Other intervertebral disc degeneration, lumbar region: Secondary | ICD-10-CM | POA: Diagnosis not present

## 2016-03-03 DIAGNOSIS — M1711 Unilateral primary osteoarthritis, right knee: Secondary | ICD-10-CM | POA: Diagnosis not present

## 2016-03-03 DIAGNOSIS — M6281 Muscle weakness (generalized): Secondary | ICD-10-CM | POA: Diagnosis not present

## 2016-03-03 DIAGNOSIS — M545 Low back pain: Secondary | ICD-10-CM | POA: Diagnosis not present

## 2016-03-03 DIAGNOSIS — R269 Unspecified abnormalities of gait and mobility: Secondary | ICD-10-CM | POA: Diagnosis not present

## 2016-03-05 DIAGNOSIS — M4316 Spondylolisthesis, lumbar region: Secondary | ICD-10-CM | POA: Diagnosis not present

## 2016-03-05 DIAGNOSIS — M545 Low back pain: Secondary | ICD-10-CM | POA: Diagnosis not present

## 2016-03-11 DIAGNOSIS — M545 Low back pain: Secondary | ICD-10-CM | POA: Diagnosis not present

## 2016-03-11 DIAGNOSIS — M6281 Muscle weakness (generalized): Secondary | ICD-10-CM | POA: Diagnosis not present

## 2016-03-11 DIAGNOSIS — R269 Unspecified abnormalities of gait and mobility: Secondary | ICD-10-CM | POA: Diagnosis not present

## 2016-03-11 DIAGNOSIS — M1711 Unilateral primary osteoarthritis, right knee: Secondary | ICD-10-CM | POA: Diagnosis not present

## 2016-03-16 DIAGNOSIS — M1711 Unilateral primary osteoarthritis, right knee: Secondary | ICD-10-CM | POA: Diagnosis not present

## 2016-03-16 DIAGNOSIS — M6281 Muscle weakness (generalized): Secondary | ICD-10-CM | POA: Diagnosis not present

## 2016-03-16 DIAGNOSIS — R2681 Unsteadiness on feet: Secondary | ICD-10-CM | POA: Diagnosis not present

## 2016-03-18 DIAGNOSIS — M6281 Muscle weakness (generalized): Secondary | ICD-10-CM | POA: Diagnosis not present

## 2016-03-18 DIAGNOSIS — M1711 Unilateral primary osteoarthritis, right knee: Secondary | ICD-10-CM | POA: Diagnosis not present

## 2016-03-18 DIAGNOSIS — R2681 Unsteadiness on feet: Secondary | ICD-10-CM | POA: Diagnosis not present

## 2016-03-22 DIAGNOSIS — M5416 Radiculopathy, lumbar region: Secondary | ICD-10-CM | POA: Diagnosis not present

## 2016-03-24 DIAGNOSIS — M1711 Unilateral primary osteoarthritis, right knee: Secondary | ICD-10-CM | POA: Diagnosis not present

## 2016-03-24 DIAGNOSIS — M6281 Muscle weakness (generalized): Secondary | ICD-10-CM | POA: Diagnosis not present

## 2016-03-24 DIAGNOSIS — R2681 Unsteadiness on feet: Secondary | ICD-10-CM | POA: Diagnosis not present

## 2016-03-29 DIAGNOSIS — M79605 Pain in left leg: Secondary | ICD-10-CM | POA: Diagnosis not present

## 2016-03-29 DIAGNOSIS — I872 Venous insufficiency (chronic) (peripheral): Secondary | ICD-10-CM | POA: Diagnosis not present

## 2016-03-29 DIAGNOSIS — I739 Peripheral vascular disease, unspecified: Secondary | ICD-10-CM | POA: Diagnosis not present

## 2016-03-29 DIAGNOSIS — M7989 Other specified soft tissue disorders: Secondary | ICD-10-CM | POA: Diagnosis not present

## 2016-03-29 DIAGNOSIS — M1711 Unilateral primary osteoarthritis, right knee: Secondary | ICD-10-CM | POA: Diagnosis not present

## 2016-03-29 DIAGNOSIS — R2681 Unsteadiness on feet: Secondary | ICD-10-CM | POA: Diagnosis not present

## 2016-03-29 DIAGNOSIS — M6281 Muscle weakness (generalized): Secondary | ICD-10-CM | POA: Diagnosis not present

## 2016-03-29 DIAGNOSIS — Z6841 Body Mass Index (BMI) 40.0 and over, adult: Secondary | ICD-10-CM | POA: Diagnosis not present

## 2016-03-30 ENCOUNTER — Other Ambulatory Visit (HOSPITAL_COMMUNITY): Payer: Self-pay | Admitting: Internal Medicine

## 2016-03-30 ENCOUNTER — Ambulatory Visit (HOSPITAL_COMMUNITY)
Admission: RE | Admit: 2016-03-30 | Discharge: 2016-03-30 | Disposition: A | Discharge status: Home / Self Care | Payer: Medicare Other | Source: Ambulatory Visit | Attending: Vascular Surgery | Admitting: Vascular Surgery

## 2016-03-30 DIAGNOSIS — I10 Essential (primary) hypertension: Secondary | ICD-10-CM | POA: Insufficient documentation

## 2016-03-30 DIAGNOSIS — I82890 Acute embolism and thrombosis of other specified veins: Secondary | ICD-10-CM | POA: Diagnosis not present

## 2016-03-30 DIAGNOSIS — I82412 Acute embolism and thrombosis of left femoral vein: Secondary | ICD-10-CM | POA: Insufficient documentation

## 2016-03-30 DIAGNOSIS — I82432 Acute embolism and thrombosis of left popliteal vein: Secondary | ICD-10-CM | POA: Insufficient documentation

## 2016-03-30 DIAGNOSIS — M7989 Other specified soft tissue disorders: Secondary | ICD-10-CM | POA: Diagnosis not present

## 2016-03-30 DIAGNOSIS — K219 Gastro-esophageal reflux disease without esophagitis: Secondary | ICD-10-CM | POA: Insufficient documentation

## 2016-03-30 DIAGNOSIS — I82409 Acute embolism and thrombosis of unspecified deep veins of unspecified lower extremity: Secondary | ICD-10-CM | POA: Diagnosis not present

## 2016-03-30 DIAGNOSIS — Z7901 Long term (current) use of anticoagulants: Secondary | ICD-10-CM | POA: Diagnosis not present

## 2016-03-30 DIAGNOSIS — M79605 Pain in left leg: Secondary | ICD-10-CM | POA: Diagnosis not present

## 2016-03-30 DIAGNOSIS — Z6841 Body Mass Index (BMI) 40.0 and over, adult: Secondary | ICD-10-CM | POA: Diagnosis not present

## 2016-03-31 DIAGNOSIS — R2681 Unsteadiness on feet: Secondary | ICD-10-CM | POA: Diagnosis not present

## 2016-03-31 DIAGNOSIS — M1711 Unilateral primary osteoarthritis, right knee: Secondary | ICD-10-CM | POA: Diagnosis not present

## 2016-03-31 DIAGNOSIS — M6281 Muscle weakness (generalized): Secondary | ICD-10-CM | POA: Diagnosis not present

## 2016-04-05 DIAGNOSIS — M4726 Other spondylosis with radiculopathy, lumbar region: Secondary | ICD-10-CM | POA: Diagnosis not present

## 2016-04-05 DIAGNOSIS — R269 Unspecified abnormalities of gait and mobility: Secondary | ICD-10-CM | POA: Insufficient documentation

## 2016-04-05 DIAGNOSIS — R2681 Unsteadiness on feet: Secondary | ICD-10-CM | POA: Diagnosis not present

## 2016-04-05 DIAGNOSIS — M4806 Spinal stenosis, lumbar region: Secondary | ICD-10-CM | POA: Diagnosis not present

## 2016-04-05 DIAGNOSIS — M5136 Other intervertebral disc degeneration, lumbar region: Secondary | ICD-10-CM | POA: Diagnosis not present

## 2016-04-05 DIAGNOSIS — M1711 Unilateral primary osteoarthritis, right knee: Secondary | ICD-10-CM | POA: Diagnosis not present

## 2016-04-05 DIAGNOSIS — M6281 Muscle weakness (generalized): Secondary | ICD-10-CM | POA: Diagnosis not present

## 2016-04-05 DIAGNOSIS — M4316 Spondylolisthesis, lumbar region: Secondary | ICD-10-CM | POA: Diagnosis not present

## 2016-04-07 DIAGNOSIS — M1711 Unilateral primary osteoarthritis, right knee: Secondary | ICD-10-CM | POA: Diagnosis not present

## 2016-04-07 DIAGNOSIS — M6281 Muscle weakness (generalized): Secondary | ICD-10-CM | POA: Diagnosis not present

## 2016-04-07 DIAGNOSIS — R2681 Unsteadiness on feet: Secondary | ICD-10-CM | POA: Diagnosis not present

## 2016-04-12 ENCOUNTER — Ambulatory Visit: Payer: Self-pay | Admitting: Neurology

## 2016-04-12 DIAGNOSIS — M1711 Unilateral primary osteoarthritis, right knee: Secondary | ICD-10-CM | POA: Diagnosis not present

## 2016-04-12 DIAGNOSIS — R2681 Unsteadiness on feet: Secondary | ICD-10-CM | POA: Diagnosis not present

## 2016-04-12 DIAGNOSIS — M6281 Muscle weakness (generalized): Secondary | ICD-10-CM | POA: Diagnosis not present

## 2016-04-19 DIAGNOSIS — M1711 Unilateral primary osteoarthritis, right knee: Secondary | ICD-10-CM | POA: Diagnosis not present

## 2016-04-19 DIAGNOSIS — M545 Low back pain: Secondary | ICD-10-CM | POA: Diagnosis not present

## 2016-04-19 DIAGNOSIS — M6281 Muscle weakness (generalized): Secondary | ICD-10-CM | POA: Diagnosis not present

## 2016-04-19 DIAGNOSIS — R2681 Unsteadiness on feet: Secondary | ICD-10-CM | POA: Diagnosis not present

## 2016-04-21 DIAGNOSIS — I10 Essential (primary) hypertension: Secondary | ICD-10-CM | POA: Diagnosis not present

## 2016-04-21 DIAGNOSIS — R7301 Impaired fasting glucose: Secondary | ICD-10-CM | POA: Diagnosis not present

## 2016-04-21 DIAGNOSIS — M859 Disorder of bone density and structure, unspecified: Secondary | ICD-10-CM | POA: Diagnosis not present

## 2016-04-26 DIAGNOSIS — G4733 Obstructive sleep apnea (adult) (pediatric): Secondary | ICD-10-CM | POA: Diagnosis not present

## 2016-04-26 DIAGNOSIS — R269 Unspecified abnormalities of gait and mobility: Secondary | ICD-10-CM | POA: Diagnosis not present

## 2016-04-26 DIAGNOSIS — G250 Essential tremor: Secondary | ICD-10-CM | POA: Diagnosis not present

## 2016-04-28 DIAGNOSIS — Z0000 Encounter for general adult medical examination without abnormal findings: Secondary | ICD-10-CM | POA: Diagnosis not present

## 2016-04-28 DIAGNOSIS — M859 Disorder of bone density and structure, unspecified: Secondary | ICD-10-CM | POA: Diagnosis not present

## 2016-04-28 DIAGNOSIS — Z1389 Encounter for screening for other disorder: Secondary | ICD-10-CM | POA: Diagnosis not present

## 2016-04-28 DIAGNOSIS — I82409 Acute embolism and thrombosis of unspecified deep veins of unspecified lower extremity: Secondary | ICD-10-CM | POA: Diagnosis not present

## 2016-04-28 DIAGNOSIS — Z6841 Body Mass Index (BMI) 40.0 and over, adult: Secondary | ICD-10-CM | POA: Diagnosis not present

## 2016-04-28 DIAGNOSIS — R7301 Impaired fasting glucose: Secondary | ICD-10-CM | POA: Diagnosis not present

## 2016-04-28 DIAGNOSIS — G250 Essential tremor: Secondary | ICD-10-CM | POA: Diagnosis not present

## 2016-04-28 DIAGNOSIS — G3184 Mild cognitive impairment, so stated: Secondary | ICD-10-CM | POA: Diagnosis not present

## 2016-04-28 DIAGNOSIS — M4800 Spinal stenosis, site unspecified: Secondary | ICD-10-CM | POA: Diagnosis not present

## 2016-04-28 DIAGNOSIS — I10 Essential (primary) hypertension: Secondary | ICD-10-CM | POA: Diagnosis not present

## 2016-04-28 DIAGNOSIS — Z7901 Long term (current) use of anticoagulants: Secondary | ICD-10-CM | POA: Diagnosis not present

## 2016-04-28 DIAGNOSIS — R2689 Other abnormalities of gait and mobility: Secondary | ICD-10-CM | POA: Diagnosis not present

## 2016-04-29 DIAGNOSIS — R2681 Unsteadiness on feet: Secondary | ICD-10-CM | POA: Diagnosis not present

## 2016-04-29 DIAGNOSIS — M1711 Unilateral primary osteoarthritis, right knee: Secondary | ICD-10-CM | POA: Diagnosis not present

## 2016-04-29 DIAGNOSIS — M545 Low back pain: Secondary | ICD-10-CM | POA: Diagnosis not present

## 2016-04-29 DIAGNOSIS — M6281 Muscle weakness (generalized): Secondary | ICD-10-CM | POA: Diagnosis not present

## 2016-05-03 DIAGNOSIS — M545 Low back pain: Secondary | ICD-10-CM | POA: Diagnosis not present

## 2016-05-03 DIAGNOSIS — R2681 Unsteadiness on feet: Secondary | ICD-10-CM | POA: Diagnosis not present

## 2016-05-03 DIAGNOSIS — M1711 Unilateral primary osteoarthritis, right knee: Secondary | ICD-10-CM | POA: Diagnosis not present

## 2016-05-03 DIAGNOSIS — M6281 Muscle weakness (generalized): Secondary | ICD-10-CM | POA: Diagnosis not present

## 2016-05-05 DIAGNOSIS — R2681 Unsteadiness on feet: Secondary | ICD-10-CM | POA: Diagnosis not present

## 2016-05-05 DIAGNOSIS — M545 Low back pain: Secondary | ICD-10-CM | POA: Diagnosis not present

## 2016-05-05 DIAGNOSIS — M1711 Unilateral primary osteoarthritis, right knee: Secondary | ICD-10-CM | POA: Diagnosis not present

## 2016-05-05 DIAGNOSIS — M6281 Muscle weakness (generalized): Secondary | ICD-10-CM | POA: Diagnosis not present

## 2016-05-18 DIAGNOSIS — B351 Tinea unguium: Secondary | ICD-10-CM | POA: Diagnosis not present

## 2016-05-18 DIAGNOSIS — M79674 Pain in right toe(s): Secondary | ICD-10-CM | POA: Diagnosis not present

## 2016-05-18 DIAGNOSIS — M2021 Hallux rigidus, right foot: Secondary | ICD-10-CM | POA: Diagnosis not present

## 2016-05-18 DIAGNOSIS — M79675 Pain in left toe(s): Secondary | ICD-10-CM | POA: Diagnosis not present

## 2016-05-19 DIAGNOSIS — B351 Tinea unguium: Secondary | ICD-10-CM | POA: Insufficient documentation

## 2016-05-19 DIAGNOSIS — M2021 Hallux rigidus, right foot: Secondary | ICD-10-CM | POA: Insufficient documentation

## 2016-05-20 DIAGNOSIS — N3020 Other chronic cystitis without hematuria: Secondary | ICD-10-CM | POA: Diagnosis not present

## 2016-05-20 DIAGNOSIS — N3946 Mixed incontinence: Secondary | ICD-10-CM | POA: Diagnosis not present

## 2016-07-14 DIAGNOSIS — R51 Headache: Secondary | ICD-10-CM | POA: Diagnosis not present

## 2016-07-14 DIAGNOSIS — Z7901 Long term (current) use of anticoagulants: Secondary | ICD-10-CM | POA: Diagnosis not present

## 2016-07-14 DIAGNOSIS — Z6841 Body Mass Index (BMI) 40.0 and over, adult: Secondary | ICD-10-CM | POA: Diagnosis not present

## 2016-07-14 DIAGNOSIS — I82409 Acute embolism and thrombosis of unspecified deep veins of unspecified lower extremity: Secondary | ICD-10-CM | POA: Diagnosis not present

## 2016-08-20 ENCOUNTER — Other Ambulatory Visit: Payer: Self-pay | Admitting: Internal Medicine

## 2016-08-20 DIAGNOSIS — R519 Headache, unspecified: Secondary | ICD-10-CM

## 2016-08-20 DIAGNOSIS — R51 Headache: Principal | ICD-10-CM

## 2016-08-23 ENCOUNTER — Other Ambulatory Visit: Payer: Medicare Other

## 2016-08-31 ENCOUNTER — Ambulatory Visit
Admission: RE | Admit: 2016-08-31 | Discharge: 2016-08-31 | Disposition: A | Discharge status: Home / Self Care | Payer: Medicare Other | Source: Ambulatory Visit | Attending: Internal Medicine | Admitting: Internal Medicine

## 2016-08-31 DIAGNOSIS — R51 Headache: Principal | ICD-10-CM

## 2016-08-31 DIAGNOSIS — R519 Headache, unspecified: Secondary | ICD-10-CM

## 2016-08-31 DIAGNOSIS — R296 Repeated falls: Secondary | ICD-10-CM | POA: Diagnosis not present

## 2016-08-31 MED ORDER — GADOBENATE DIMEGLUMINE 529 MG/ML IV SOLN
20.0000 mL | Freq: Once | INTRAVENOUS | Status: AC | PRN
  Administered 2016-08-31: 20 mL via INTRAVENOUS

## 2016-09-01 NOTE — Progress Notes (Signed)
[name] was seen today in the movement disorders clinic for neurologic consultation at the request of [name], MD.  The consultation is for the evaluation of tremor.  The records that were made available to me were reviewed.  Pt is R hand dominant.  Pt reports tremor for 20-30 years, but was mild when it started.  It started in both hands.  Her father had the same thing, as did her sister and her son now has the same thing.  It didn't really start to bother her until 5-6 years ago.  She started on primidone at that time, 50 mg and it helped.  It was increased to 100 mg and it helped as well.  No SE with the medication.  Over the last few years, tremor has continued to pick up, and she is noticing it in the legs now as well.  She notices that anxiety/stress will increase the tremor.   In January, the patient ran out of her primidone for 4-5 days and she really noted a deterioration in the tremor.  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  She does not necessarily doze unexpectedly, but is very sleepy during the day.  She has an appointment with [name] to discuss this.   07/29/14 update:  Last visit, I increased the patients primidone to 50 mg in the AM and she is on 100 mg at night.  She is doing better in terms of tremor.  She has good days and bad days and she knows that stress makes it worse.  She did see [name] since last visit and I reviewed those notes.  She had a PSG.  She had an AHI of 60.6 but this was all in supine position and AHI in lateral position was 0.0 (but I was unsure of how much time, if any, was spent in the lateral position).  CPAP at 6 was recommended but she admits that she "hasn't given in to that" and wants to talk with Dr. [name].    01/29/15 update:  Pt returns today.  She had called and wanted to try to increase the primidone to 100 mg bid but it made her too sleepy.  She is therefore just on 2 at night.  She generally is doing  okay, but her husband is physically not well and that stress will increase tremor.  Husband with dementia and she is caregiving.  She is seeking out advice from psychologist.  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  The records that were made available to me were reviewed.  Less sleepy during the day with CPAP.    08/01/15 update:  The patient returns today for follow-up.  I have reviewed records since her last visit.  She has a history of essential tremor.  She is currently on primidone, 100 mg at night.  Daytime dosages have caused sleepiness.  She did follow-up with [name] on 04/14/2015 in regards to her sleep apnea.  She has been very compliant with this.  She reports that she is doing worse.  She states that she can hardly get food in the mouth at times.  She has no asthma.  She states that she has tried to relieve stress (primary caregiver for her husband who is ill) through painting and when she tried to sign the painting today, she couldn't do it.  She has never been on a beta blocker.  She does state  that she has been ill recently and is on an aquatic's, but she is not on any prednisone.  11/04/15 update:  The patient is following up today regarding her tremor.  I have reviewed records since our last visit.  She is on primidone, 100 mg at night.  Last visit, I cautiously started her on Inderal LA, 80 mg daily.  I told her to call me with any side effects or problems.  I have not heard from her, but it appears that she has been in the emergency room multiple times.  She went to the emergency room on 08/11/2015 after a fall on her arm.  She states that with this one she misstepped.  A CT of the brain was performed and was negative.   A few days later she twisted wrong in the kitchen and fell.   On 08/24/2015 she went back to the emergency room with another fall.  She fell in the kitchen and hit her head on the dishwasher.  Her scalp wound was repaired with sutures and staples and she was advised to discontinue  her Inderal.  Of note was that her pulse was 66 in the emergency room and blood pressure was 152/72.  She followed up with her primary care physician and had an MRI of the brain on 09/07/2015.  I had the opportunity to review this.  It was nonacute.  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  Since those falls, she has gone to PT and gone to the gym.  She loved the PT but she states that sx's are so erratic that she may not know it was going to occur.  She went back to the emergency room on 08/28/2015 with complaints of knee pain, after her right leg gave out while walking in a restaurant.  She had one other fall where she tripped over a throw rug at a friends house.  She attributes the last 2 falls to back pain.    She saw [name] on 10/02/2015 with excessive daytime hypersomnolence.  [name] interrogated her machine and she was very compliant with her CPAP and there was no significant mask leak.  [name] suggested perhaps decreasing her Inderal and also started her on Provigil, 100 mg daily.  The patient didn't try the provigil yet as she was worried about SE that [name] talked to her about.  Separate from all of this, she c/o sweats.  She is unsure of how long she has been on cymbalta or how long the sweats have been.  Her pharmacist suggested that the sweats may have been from propranolol.  02/04/16 update:  The patient follows up today for essential tremor.  She remains on primidone, 100 mg at night.  Daytime dosages have made her sleepy, so she is only on nighttime dosages.  Last visit, I changed her from Inderal LA, 80 mg to propranolol 20 mg twice a day, primarily because she started falling after I added the Inderal LA.  She called me not long after I did that to let me know that while she was no longer falling, she could not tolerate the tremor and wanted to go back up on the medication.  I was very leery about this, but  we ultimately told her she can go up to 20 mg 3 times a day.  We called her shortly thereafter and she stated that she was only doing 3 times a  day as needed.  She states today, however, that she is only taking 20 mg at night unless she has a "lunch date" and then she may take an extra during the day.   Last visit, she was also complaining about significant diaphoresis and I told her that it could be the Cymbalta and told her that perhaps she could discuss this with her prescribing physician.  She actually thinks that lowering the propranolol helped the sweating but she is still sweating (but states that weather is bit cooler).  Unfortunately, despite the change in medication, she has had a few more falls.  The last fall was a week ago.  She had her cane and was coming out of the store and she "stubbed" her foot and fell.  No dizziness associated with the falls.  She is in her 2nd session of PT in [place].    09/02/16 update:  Pt f/u today.  This patient is accompanied in the office by her daughter who supplements the history.  The records that were made available to me were reviewed since last visit.  On primidone - 100 mg q hs but she d/c her propranolol since last visit.  Last visit, we discussed EMG for peripheral neuropathy and the patient called back the day after the last visit and wanted to schedule that but she ultimately cancelled the appt.  She fell in March and the only other fall was on the grass a few weeks ago at church as she was on uneven terrain.  She continues to caregive and is stressed but is seeing her counselor.  Reports that in April she had a DVT and is now on eloquis.  Not long after starting on eloquis, she noted a sharp shooting pain in the L face and L eye.  States that she saw PCP and did not have suspicion for temporal arteritis.  It generally has gotten better and is not coming often now.  She cannot remember the last time that she had that.  The patient had an MRI of the brain on  08/31/16 that I had the opportunity to review.  There was significant/severe white matter disease but it had not changed since 2016.  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  I reviewed this with pt/daughter.  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  She states that she thinks that last time she had angiogram was at her AVM coiling in the 1990's at [facility].    Still worries about her memory but does think that stress is affecting that.  Husband with dementia.  Looking into moving into assisted living.  ALLERGIES:   Allergies  Allergen Reactions  . Buprenorphine Hcl Nausea And Vomiting  . Codeine Nausea And Vomiting  . Morphine And Related Nausea And Vomiting    CURRENT MEDICATIONS:  Current Outpatient Prescriptions on File Prior to Visit  Medication Sig Dispense Refill  . celecoxib (CELEBREX) 200 MG capsule     . Cholecalciferol (VITAMIN D-3) 1000 UNITS CAPS Take 1,000 Units by mouth daily.     Marland Kitchen esomeprazole (NEXIUM) 40 MG capsule     . primidone (MYSOLINE) 50 MG tablet Take 2 tablets (100 mg total) by mouth at bedtime. 180 tablet 1  . traMADol (ULTRAM) 50  MG tablet Take 1 tablet (50 mg total) by mouth every 6 (six) hours as needed for severe pain. 15 tablet 0  . trimethoprim (TRIMPEX) 100 MG tablet Take 100 mg by mouth daily.     . valsartan (DIOVAN) 160 MG tablet Take 160 mg by mouth daily.      No current facility-administered medications on file prior to visit.     PAST MEDICAL HISTORY:   Past Medical History:  Diagnosis Date  . 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 Gallstones   . GERD (gastroesophageal reflux disease)   . Hypertension   . Osteoporosis   . Wears glasses     PAST SURGICAL HISTORY:   Past  Surgical History:  Procedure Laterality Date  . ABDOMINAL HYSTERECTOMY  1983  . arterial vascular fistula  2004   coiled at [facility]  [phone]  . CARPAL TUNNEL RELEASE  1992   right arm  . CHOLECYSTECTOMY N/A 03/29/2013   Procedure: LAPAROSCOPIC CHOLECYSTECTOMY WITH INTRAOPERATIVE CHOLANGIOGRAM;  Surgeon: Haywood Lasso, MD;  Location: [facility];  Service: General;  Laterality: N/A;  . EUS N/A 03/09/2013   Procedure: UPPER ENDOSCOPIC ULTRASOUND (EUS) LINEAR;  Surgeon: Beryle Beams, MD;  Location: [facility];  Service: Endoscopy;  Laterality: N/A;  . LUMBAR LAMINECTOMY/DECOMPRESSION MICRODISCECTOMY  11/30/2012   Procedure: LUMBAR LAMINECTOMY/DECOMPRESSION MICRODISCECTOMY 2 LEVELS;  Surgeon: Magnus Sinning, MD;  Location: [facility];  Service: Orthopedics;  Laterality: N/A;  DECOMPRESSION LAMINECTOMY L4-L5, L5-S1 CENTRAL   . ROTATOR CUFF REPAIR  2004   two on right, one on left  . TRIGGER FINGER RELEASE     right hand    SOCIAL HISTORY:   Social History   Social History  . Marital status: Married    Spouse name: Richard  . Number of children: 2  . Years of education: 63   Occupational History  . interior designer     partially retired   Social History Main Topics  . Smoking status: Former Smoker    Packs/day: 0.50    Types: Cigarettes    Quit date: 07/11/1970  . Smokeless tobacco: Never Used  . Alcohol use 0.0 oz/week     Comment: wine occasionally  . Drug use: No  . Sexual activity: Not Currently   Other Topics Concern  . Not on file   Social History Narrative   Patient is right handed, resides with spouse in a home.   1 cup of coffee a day     FAMILY HISTORY:   Family Status  Relation Status  . Mother Deceased at age 58   sarcoma  . Father Deceased   complications from broken hip, ET  . Sister Deceased   blood clot, ET  . Son Alive   healthy, [place]  . Daughter Alive   healthy    ROS:  A complete 10 system review of  systems was obtained and was unremarkable apart from what is mentioned above.  PHYSICAL EXAMINATION:    VITALS:   Vitals:   09/02/16 1120  BP: 138/86  BP Location: Left Arm  Patient Position: Sitting  Cuff Size: Normal  Pulse: 76  Weight: 215 lb (97.5 kg)  Height: 4\' 11"  (1.499 m)    GEN:  The patient appears stated age and is in NAD. HEENT:  Normocephalic, atraumatic.  The mucous membranes are moist. The superficial temporal arteries are without ropiness or tenderness. CV:  RRR Lungs:  CTAB Neck/HEME:  There are no carotid bruits bilaterally.  Neurological examination:  Orientation: The patient is alert and oriented x3.  Cranial nerves: There is good facial symmetry.  Extraocular muscles are intact. The visual fields are full to confrontational testing. The speech is fluent and clear. Soft palate rises symmetrically and there is no tongue deviation. Hearing is intact to conversational tone. Sensation: Sensation is intact to light touch.   Vibration is decreased at the bilateral big toe. There is no extinction with double simultaneous stimulation. There is no sensory dermatomal level identified. Motor: Strength is 5/5 in the bilateral upper and lower extremities.   Shoulder shrug is equal and symmetric.  There is no pronator drift.   Movement examination: Tone: There is normal tone in the bilateral upper extremities.  The tone in the lower extremities is normal.  Abnormal movements: There is mild bilateral independent resting tremor.  There is much more significant tremor of the outstretched hands that increases with intention bilaterally.  Coordination:  There is no significant decremation with RAM's, with any form of rapid alternating movement including alternating supination and pronation of the forearm, hand opening and closing, finger taps, heel taps and toe taps. Gait and Station: The patient has no significant difficulty arising out of a deep-seated chair without the use of  the hands. The patient has a wide based gait and is antalgic.    ASSESSMENT/PLAN:  1.  Essential tremor.  - She had difficulty with daytime hypersomnolence with daytime dosages of primidone.  She is on 100 mg at night.  She has been started on eloquis since our last visit and told her that primidone could interfere with this.  Reports that she is only to be on eloquis a few more weeks.  Will d/c primidone until then. We will call her on Oct 18 to see if off of eloquis.    -Initially, we thought that her falls were related to Inderal LA, 80 mg daily but ultimately decided that they were not.  She has taken herself off of it for now and will leave off.   2.  Falls  -doesn't appear to be associated with medications  -fall frequency markedly improved.  Using cane at all times per pt (daughter not so sure)  -has evidence of peripheral neuropathy.  Cancelled her EMG. 3.  Hx AVM repair with coiling  -She does have an upgoing toe on the right, which I think is likely just a result of her history of AVM, which she reports was on the left.  I see nothing else focal or lateralizing on her examination today, so decided to hold off on any further neuroimaging.    -will do MRA or CTA as recent MRI said that had evidence of aneurysm in R ICA.  However, I need to find out from radiology if this was intracranial or extracranial. 4.  Trigeminal neuralgia  -L face pain sounds c/w TN.  Pt doing better and wants no med.  Warned her that it may come back.  We discussed the diagnosis as well as pathophysiology of the disease.  We discussed treatment options as well as prognostic indicators.  Patient education was provided. 4.  OSAS  -On CPAP faithfully and doing well.  Still has EDS and seeing [name].  [name] gave her provigil but pt hasnt' tried it yet. 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  -Admits that she has been somewhat more depressed lately, but that is primarily because of falls and sweating.  She is already seeing psychology.  Under more stress with caregiving for her husband who is ill.  She is not suicidal or homicidal.  Talked about coping mechanisms.    -worries about dementia but think depression is just playing a major role.  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  Talked to her about neuropsych testing.  Encouraged her to proceed and she will think about it 6.  Diaphoresis  -Improved off of cymbalta and wellbutrin 7.  Much greater than 50% of this visit was spent in counseling and coordinating care.  Total face to face time:  40 min

## 2016-09-02 ENCOUNTER — Encounter: Payer: Self-pay | Admitting: Neurology

## 2016-09-02 ENCOUNTER — Ambulatory Visit (INDEPENDENT_AMBULATORY_CARE_PROVIDER_SITE_OTHER): Payer: Medicare Other | Admitting: Neurology

## 2016-09-02 VITALS — BP 138/86 | HR 76 | Ht 59.0 in | Wt 215.0 lb

## 2016-09-02 DIAGNOSIS — G500 Trigeminal neuralgia: Secondary | ICD-10-CM

## 2016-09-02 DIAGNOSIS — G4733 Obstructive sleep apnea (adult) (pediatric): Secondary | ICD-10-CM | POA: Diagnosis not present

## 2016-09-02 DIAGNOSIS — G250 Essential tremor: Secondary | ICD-10-CM

## 2016-09-02 DIAGNOSIS — Z9989 Dependence on other enabling machines and devices: Secondary | ICD-10-CM

## 2016-09-02 DIAGNOSIS — F331 Major depressive disorder, recurrent, moderate: Secondary | ICD-10-CM | POA: Diagnosis not present

## 2016-09-02 NOTE — Patient Instructions (Signed)
1.  Decrease primidone to 1 tablet nightly for a week and then discontinue 2.  [name] will call you regarding any further testing we may need to do for your history of aneurysm 3.  Let me know if you want to schedule the neuropschyometric testing for memory change.   4.  Call us if the left face pain comes back before your next visit

## 2016-09-03 ENCOUNTER — Telehealth: Payer: Self-pay | Admitting: Neurology

## 2016-09-03 DIAGNOSIS — I729 Aneurysm of unspecified site: Secondary | ICD-10-CM

## 2016-09-03 DIAGNOSIS — I6521 Occlusion and stenosis of right carotid artery: Secondary | ICD-10-CM

## 2016-09-03 NOTE — Telephone Encounter (Signed)
Called [facility] to find out from radiology if evidence of aneurysm in R ICA was intracranial or extracranial and if they would recommend CTA or MRA. Dr. Jeannine Kitten who read the scan will be back Monday and let us know.

## 2016-09-08 DIAGNOSIS — L218 Other seborrheic dermatitis: Secondary | ICD-10-CM | POA: Diagnosis not present

## 2016-09-08 DIAGNOSIS — L812 Freckles: Secondary | ICD-10-CM | POA: Diagnosis not present

## 2016-09-08 DIAGNOSIS — L814 Other melanin hyperpigmentation: Secondary | ICD-10-CM | POA: Diagnosis not present

## 2016-09-08 DIAGNOSIS — D1801 Hemangioma of skin and subcutaneous tissue: Secondary | ICD-10-CM | POA: Diagnosis not present

## 2016-09-08 DIAGNOSIS — Z85828 Personal history of other malignant neoplasm of skin: Secondary | ICD-10-CM | POA: Diagnosis not present

## 2016-09-08 DIAGNOSIS — L821 Other seborrheic keratosis: Secondary | ICD-10-CM | POA: Diagnosis not present

## 2016-09-08 NOTE — Telephone Encounter (Signed)
Patient made aware. Order entered.

## 2016-09-08 NOTE — Telephone Encounter (Signed)
Left message on machine for patient to call back.

## 2016-09-08 NOTE — Telephone Encounter (Signed)
Thank you!  Order MRA without contrast to further eval

## 2016-09-08 NOTE — Telephone Encounter (Signed)
See addended MR brain report. Please advise.

## 2016-09-10 ENCOUNTER — Telehealth: Payer: Self-pay | Admitting: Neurology

## 2016-09-10 ENCOUNTER — Ambulatory Visit
Admission: RE | Admit: 2016-09-10 | Discharge: 2016-09-10 | Disposition: A | Discharge status: Home / Self Care | Payer: Medicare Other | Source: Ambulatory Visit | Attending: Neurology | Admitting: Neurology

## 2016-09-10 DIAGNOSIS — I6521 Occlusion and stenosis of right carotid artery: Secondary | ICD-10-CM | POA: Diagnosis not present

## 2016-09-10 DIAGNOSIS — I729 Aneurysm of unspecified site: Secondary | ICD-10-CM

## 2016-09-10 NOTE — Telephone Encounter (Signed)
Pt wants a call about her MRA results please.

## 2016-09-10 NOTE — Telephone Encounter (Signed)
-----   Message from [place], DO sent at 09/10/2016  3:09 PM EDT ----- Let pt know that the small aneurysm identified has not changed over the year.

## 2016-09-10 NOTE — Telephone Encounter (Signed)
Patient made aware of results.  

## 2016-09-10 NOTE — Telephone Encounter (Signed)
Will call with results once exam is read and Dr. Carles Collet reviews. Patient just had scan today.

## 2016-09-21 ENCOUNTER — Encounter: Payer: Self-pay | Admitting: Psychology

## 2016-09-21 ENCOUNTER — Ambulatory Visit (INDEPENDENT_AMBULATORY_CARE_PROVIDER_SITE_OTHER): Payer: Medicare Other | Admitting: Psychology

## 2016-09-21 DIAGNOSIS — R413 Other amnesia: Secondary | ICD-10-CM

## 2016-09-21 DIAGNOSIS — F4323 Adjustment disorder with mixed anxiety and depressed mood: Secondary | ICD-10-CM

## 2016-09-21 NOTE — Progress Notes (Signed)
NEUROPSYCHOLOGICAL INTERVIEW (CPT: [id number])  Name: [name] Date of Birth: 04-29-1941 Date of Interview: 09/21/2016  Reason for Referral:  [name] is a 75 y.o., right-handed, married female who is referred for neuropsychological evaluation by Dr. Wells Guiles Tat of [facility] due to concerns about memory loss. This patient is accompanied in the office by her son, Gerald Stabs, and daughter, Danton Clap, who supplement the history.  History of Presenting Problem:  [name] is followed by Dr. Carles Collet for essential tremor which is managed with primidone. She also has a history of frequent falls but this has improved more recently. Additionally, the patient has a history of AVM repair with coiling.  An MRI of the brain on 08/31/2016 reportedly demonstrated significant/severe white matter disease with no major change since 2016. 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  In the past year, the patient has had some difficulty with cognitive "overload". This was particularly noticeable in Feb/March of this year. She described having difficulty with memory and planning. Her children reported acute confusion. She would forget conversations and events and had a tendency to repeat herself. 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 However, this largely resolved. She continues to have episodic difficulty performing tasks and processing information when she is under a lot of stress. She also reported word finding difficulty (which is much worse on "bad" days) but no receptive language or comprehension difficulties. She has not had any trouble with navigating when driving and she has never gotten lost.  The patient is the primary caregiver for her husband with dementia. Her husband is completely dependent on her, and he has anxiety about something happening to her because  there would be no one to care for him. This in turn makes her feel guilty when she cannot be with him or care for him. She does have some assistance from a caregiver who comes twice a week, 8 hours/week. This has been very helpful over the past 2 years, as she has been a companion for both the patient and her husband. Otherwise, there is very little support. Her husband has 8 children but only one of them visits him. The patient works very hard to get her husband out and engaged in social activities. She does this at the expense of her own social interaction, and she is a much more social person than he. She is hardly ever away from her husband. She was working up until this past Spring, and that provided her with more interaction. She does play in a bridge club and she is in a book club but she has no regular social interaction/connection other than the caregiver and her children. She tries to arrange luncheons with friends but it is usually too difficult because she is taking her husband to various activities (e.g., gym three times a week, bowling twice a week). Managing his schedule has become stressful. Additionally, she expends a lot of energy trying to keep him calm. He recently spoke of suicidal ideation, and she had to remove the handgun from their home. She has never attended a dementia caregiver support group. She is looking into assisted living facilities, and is having some difficulty accepting this.  She has taken antidepressants for a number of years. Her medications were recently changed as she seemed to have develop tolerance to the medication she had taken for several years.  Current Functioning: Mrs.  [name] lives with her husband in a condominium. She continues to drive. Her children reported concerns about her slow speed when driving. She also continues to manage medications and appointments. She does cooking. She denies any issues with these tasks. Her son has begun assisting with the  finances, and she is thankful for this.   She has back pain and back issues that interfere with her mobility and her ability to complete certain tasks. She is now walking with a cane.  She reported recent onset of sleep difficulty characterized by frequent awakening. She uses her CPAP nightly. She has had to take Tylenol PM for the past few nights.   She denied any change in appetite. She does have depression and anxiety related to her stressors.   Social History: Born/Raised: [employer] Education: 2 bachelors degrees. 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 She was still working part time in the winter/spring but had to stop working in the middle of a job due to her cognitive deficits.  Marital history: Married x2. First marriage lasted 41 years. She remarried nine years later. She has been married to her current husband for 27 years. She has two children and three grandsons. Alcohol/Tobacco/Substances: Occasional/rare alcohol ("once a quarter" per her son), no current tobacco use. No substance abuse.  Medical History: Past Medical History:  Diagnosis Date  . 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 Gallstones   . GERD (gastroesophageal reflux disease)   . Hypertension   . Osteoporosis   . Wears glasses    Essential tremor Sleep apnea (wears CPAP)   Current Medications:  Outpatient Encounter Prescriptions as of 09/21/2016  Medication Sig  . apixaban (ELIQUIS) 5 MG TABS tablet Take by mouth.  . celecoxib (CELEBREX) 200 MG capsule   . Cholecalciferol (VITAMIN D-3) 1000 UNITS CAPS Take 1,000 Units by mouth daily.   Marland Kitchen escitalopram (LEXAPRO) 20 MG tablet   . esomeprazole (NEXIUM) 40 MG capsule   . mirabegron ER (MYRBETRIQ) 50 MG TB24 tablet Take 50 mg by mouth.  . primidone (MYSOLINE) 50 MG tablet Take 2 tablets (100  mg total) by mouth at bedtime.  . traMADol (ULTRAM) 50 MG tablet Take 1 tablet (50 mg total) by mouth every 6 (six) hours as needed for severe pain.  Marland Kitchen trimethoprim (TRIMPEX) 100 MG tablet Take 100 mg by mouth daily.   . valsartan (DIOVAN) 160 MG tablet Take 160 mg by mouth daily.    No facility-administered encounter medications on file as of 09/21/2016.      Behavioral Observations:   Appearance: Neatly and appropriately dressed and groomed Gait: Ambulated with a cane, some unsteadiness observed Speech: Fluent; normal rate, rhythm and volume. Articulate. Thought process: Circumstantial to tangential Affect: Full, appropriate to context Interpersonal: Pleasant, appropriate   TESTING: There is medical necessity to proceed with neuropsychological assessment as the results will be used to aid in differential diagnosis and clinical decision-making and to inform specific treatment recommendations. Per the patient, her children and medical records reviewed, there has been a change in cognitive functioning and a reasonable suspicion of dementia versus pseudodementia.   PLAN: The patient will return for a full battery of neuropsychological testing with a psychometrician under my supervision. Education regarding testing procedures was provided. Subsequently, the patient will see this provider for a follow-up session at which time  her test performances and my impressions and treatment recommendations will be reviewed in detail.   Full neuropsychological evaluation report to follow.

## 2016-09-22 ENCOUNTER — Ambulatory Visit (INDEPENDENT_AMBULATORY_CARE_PROVIDER_SITE_OTHER): Payer: Medicare Other | Admitting: Psychology

## 2016-09-22 DIAGNOSIS — R413 Other amnesia: Secondary | ICD-10-CM

## 2016-09-22 DIAGNOSIS — G250 Essential tremor: Secondary | ICD-10-CM | POA: Diagnosis not present

## 2016-09-22 NOTE — Progress Notes (Signed)
   Neuropsychology Note  [name] returned today for 2 hours of neuropsychological testing with technician, Milana Kidney, BS, under the supervision of Dr. Macarthur Critchley. The patient did not appear overtly distressed by the testing session, per behavioral observation or via self-report to the technician. Rest breaks were offered. [name] will return within 2 weeks for a feedback session with Dr. Si Raider at which time her test performances, clinical impressions and treatment recommendations will be reviewed in detail. The patient understands she can contact our office should she require our assistance before this time.  Full report to follow.

## 2016-09-25 DIAGNOSIS — Z23 Encounter for immunization: Secondary | ICD-10-CM | POA: Diagnosis not present

## 2016-10-04 NOTE — Progress Notes (Signed)
NEUROPSYCHOLOGICAL EVALUATION   Name:    [name]  Date of Birth:   03-13-1941 Date of Interview:  09/21/2016 Date of Testing:  09/22/2016   Date of Feedback:  10/05/2016      Background Information:  Reason for Referral:  [name] is a 75 y.o. female referred by Dr. Wells Guiles Tat to assess her current level of cognitive functioning and assist in differential diagnosis. The current evaluation consisted of a review of available medical records, an interview with the patient and her son [name]) and daughter [name]), and the completion of a neuropsychological testing battery. Informed consent was obtained.  History of Presenting Problem:  [name] is followed by Dr. Carles Collet for essential tremor which is managed with primidone. She also has a history of frequent falls but this has improved more recently. Additionally, the patient has a history of AVM repair with coiling.  An MRI of the brain on 08/31/2016 reportedly demonstrated significant/severe white matter disease with no major change since 2016. 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  In the past year, the patient has had some difficulty with cognitive "overload". This was particularly noticeable in Feb/March of this year. She described having difficulty with memory and planning. Her children reported acute confusion. She would forget conversations and events and had a tendency to repeat herself. 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 However, this largely resolved. She continues to have episodic difficulty performing tasks and processing information when she is under a lot of stress. She also reported word finding difficulty (which is much worse on "bad" days) but no receptive language or comprehension difficulties. She has not had any trouble with navigating when driving and she has  never gotten lost.  The patient is the primary caregiver for her husband with dementia. Her husband is completely dependent on her, and he has anxiety about something happening to her because there would be no one to care for him. This in turn makes her feel guilty when she cannot be with him or care for him. She does have some assistance from a caregiver who comes twice a week, 8 hours/week. This has been very helpful over the past 2 years, as she has been a companion for both the patient and her husband. Otherwise, there is very little support. Her husband has 8 children but only one of them visits him. The patient works very hard to get her husband out and engaged in social activities. She does this at the expense of her own social interaction, and she is a much more social person than he. She is hardly ever away from her husband. She was working up until this past Spring, and that provided her with more interaction. She does play in a bridge club and she is in a book club but she has no regular social interaction/connection other than the caregiver and her children. She tries to arrange luncheons with friends but it is usually too difficult because she is taking her husband to various activities (e.g., gym three times a week, bowling twice a week). Managing his schedule has become stressful. Additionally, she expends a lot of energy trying to keep him calm. He recently spoke of suicidal ideation, and she had to remove the handgun from their home. She has never attended a dementia caregiver support group. She is looking into assisted living facilities, and is  having some difficulty accepting this.  She has taken antidepressants for a number of years. Her medications were recently changed as she seemed to have develop tolerance to the medication she had taken for several years. She sees a Engineer, water regularly.  Current Functioning: [name] lives with her husband in a condominium. She continues to  drive. Her children reported concerns about her slow speed when driving. She also continues to manage medications and appointments. She does the cooking. She denies any issues with these tasks. Her son has begun assisting with the finances, and she is thankful for this.   She has back pain and back issues that interfere with her mobility and her ability to complete certain tasks. She is now walking with a cane.  She reported recent onset of sleep difficulty characterized by frequent awakening. She uses her CPAP nightly. She has had to take Tylenol PM for the past few nights.   She denied any change in appetite. She does have depression and anxiety related to her stressors.  Social History: Born/Raised: [employer] Education: 2 bachelors degrees. 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 She was still working part time in the winter/spring but had to stop working in the middle of a job due to her cognitive deficits.  Marital history: Married x2. First marriage lasted 29 years. She remarried nine years later. She has been married to her current husband for 27 years. She has two children and three grandsons. Alcohol/Tobacco/Substances: Occasional/rare alcohol ("once a quarter" per her son), no current tobacco use. No substance abuse.  Medical History:  Past Medical History:  Diagnosis Date  . 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 Gallstones   . GERD (gastroesophageal reflux disease)   . Hypertension   . Osteoporosis   . Wears glasses   Essential tremor Sleep apnea (wears CPAP)  Current medications:  Outpatient Encounter Prescriptions as of 10/05/2016  Medication Sig  . apixaban (ELIQUIS) 5 MG TABS tablet Take by mouth.  . celecoxib (CELEBREX) 200 MG capsule   . Cholecalciferol (VITAMIN D-3) 1000 UNITS CAPS Take 1,000  Units by mouth daily.   Marland Kitchen escitalopram (LEXAPRO) 20 MG tablet   . esomeprazole (NEXIUM) 40 MG capsule   . mirabegron ER (MYRBETRIQ) 50 MG TB24 tablet Take 50 mg by mouth.  . primidone (MYSOLINE) 50 MG tablet Take 2 tablets (100 mg total) by mouth at bedtime.  . traMADol (ULTRAM) 50 MG tablet Take 1 tablet (50 mg total) by mouth every 6 (six) hours as needed for severe pain.  Marland Kitchen trimethoprim (TRIMPEX) 100 MG tablet Take 100 mg by mouth daily.   . valsartan (DIOVAN) 160 MG tablet Take 160 mg by mouth daily.    No facility-administered encounter medications on file as of 10/05/2016.      Current Examination:  Behavioral Observations:  Appearance: Neatly and appropriately dressed and groomed Gait: Ambulated with a cane, some unsteadiness observed Speech: Fluent; normal rate, rhythm and volume. Articulate. Thought process: Circumstantial to tangential Affect: Full, appropriate to context Interpersonal: Pleasant, appropriate Orientation: Oriented to person, place and most aspects of time (off on the date by two days). Accurately named the current President and his predecessor.  Tests Administered: . Test of Premorbid Functioning (TOPF) . Wechsler Adult Intelligence Scale-Fourth Edition (WAIS-IV): Similarities, Block Design, Matrix Reasoning, Coding and Digit Span subtests .  Wechsler Memory Scale-Fourth Edition (WMS-IV) Older Adult Version (ages 5-90): Logical Memory I, II and Recognition subtests  . [employer] Verbal Learning Test - 2nd Edition (CVLT-2) Short Form . Repeatable Battery for the Assessment of Neuropsychological Status (RBANS) Form A:  Figure Copy and Recall subtests . Neuropsychological Assessment Battery (NAB) Language Module, Form 1: Naming Subtest . Symbol Digit Modalities Test (SDMT) - oral only . Controlled Oral Word Association Test (COWAT) . Trail Making Test A and B . Clock drawing test . [employer] [facility]) . Generalized Anxiety Disorder - 7 item  screener (GAD-7) . Beck Depression Inventory - Second edition (BDI-II)  Test Results: Note: Standardized scores are presented only for use by appropriately trained professionals and to allow for any future test-retest comparison. These scores should not be interpreted without consideration of all the information that is contained in the rest of the report. The most recent standardization samples from the test publisher or other sources were used whenever possible to derive standard scores; scores were corrected for age, gender, ethnicity and education when available.   Test Scores:  Test Name Standardized Score Descriptor  TOPF SS= 109 Average  WAIS-IV Subtests    Similarities ss= 16 Very superior  Block Design ss= 15 Superior  Matrix Reasoning ss= 14 Superior  Coding ss= 11 Average  Digit Span Forward ss= 13 High average  Digit Span Backward ss= 8 Average  WMS-IV Subtests    LM I ss= 13 High average  LM II ss= 9 Average  LM II Recognition Cum %: >75 Above average  RBANS Subtests    Figure Copy Z= 1.2 High average  Figure Recall Z= 0.6 Average  CVLT-II Scores    Trial 1 Z= -1 Low average  Trial 4 Z= -0.5 Average  Trials 1-4 total T= 45 Average  SD Free Recall Z= -1 Low average  LD Free Recall Z= 0 Average  LD Cued Recall Z= -0.5 Average  Recognition Discriminability (8/9 hits, 4 false positives) Z= -1 Low average  Forced Choice Recognition Raw= 9/9 WNL  NAB Naming T= 60 High average  SDMT - oral Z= -1.5 Borderline  COWAT-FAS T= 59 High average  COWAT-Animals T= 54 Average  Trail Making Test A 0 errors T= 57 High average  Trail Making Test B 0 errors T= 58 High average  Clock Drawing  WNL  WCST    Total errors T= 44 Average  Perseverative Responses T= 51 Average  Perseverative Errors T= 48 Average  Conceptual Level Responses T= 43 Average  Categories Completed 1; 11-16%   Trials to Complete 1st Category 45; 11-16%   Failure to Maintain Set 2   GAD-7 10/21 Severe    BDI-II 20/63 Moderate      Description of Test Results:  Premorbid verbal intellectual abilities were estimated to have been within the average range based on a test of word reading. Psychomotor processing speed was average. When the motor component was removed, processing speed was borderline impaired. Auditory attention and working memory were high average to average, respectively. Visual-spatial construction was high average to superior. Language abilities were intact. Specifically, confrontation naming was high average, and semantic verbal fluency was average. With regard to verbal memory, encoding and acquisition of non-contextual information (i.e., word list) was average. After a brief distracter task, free recall was low average. After a delay, free recall was average. Cued recall was average. Performance on a yes/no recognition task was low average due to increased false positive responses. On another  verbal memory test, encoding and acquisition of contextual auditory information (i.e., short stories) was high average. After a delay, free recall was average. Performance on a yes/no recognition task was above average. With regard to non-verbal memory, delayed free recall of visual information was average. Executive functioning was within normal limits overall. Mental flexibility and set-shifting were high average on Trails B. Verbal fluency with phonemic search restrictions was high average. Verbal abstract reasoning was very superior. Non-verbal abstract reasoning was superior. Deductive reasoning and problem solving abilities were within normal limits. Performance on a clock drawing task was intact. On self-report questionnaires, the patient's responses were indicative of clinically significant depression and anxiety at the present time.    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 Results of cognitive testing were entirely within normal limits, with many  areas above average. She did demonstrate a relative weakness in learning/retrieval of non-contextual auditory information, which I suspect is related to stress of increased task demands (i.e., self reported cognitive "overload"). Test results do not warrant a diagnosis of a cognitive disorder and there is no evidence to suggest an underlying dementia or Alzheimer's disease at this time. Meanwhile, she is reporting a significant level of depression and anxiety, which appear related to caregiver stress. Based on the available data, it is most likely that her subjective cognitive complaints are due to depression/anxiety stress in daily life. This corresponds with the fluctuating nature of her cognitive symptoms as well.   Recommendations/Plan: Based on the findings of the present evaluation, the following recommendations are offered:  1. No Tylenol PM (can try melatonin). Continue using CPAP. 2. Caregiver support group and/or memory cafe may be helpful in increasing patient's socialization with other caregivers. 3. Increased caregiver/companion time in order to allow for respite is highly recommended. This way the patient can engage in relaxation, hobbies or social events. 4. Continue individual counseling for depression/caregiver stress. 5. Optimal control of vascular risk factors (e.g., hypertension) in order to reduce the risk of vascular cognitive impairment. 6. Re-evaluation in one year can be considered, especially if the patient is demonstrating any new cognitive issues in daily life. The current results will serve as a nice baseline for future comparison.   Feedback to Patient: [name] and her son and daughter returned for a feedback appointment on 10/05/2016 to review the results of her neuropsychological evaluation with this provider. 35 minutes face-to-face time was spent reviewing her test results, my impressions and my recommendations as detailed above.    Total time spent on this  patient's case: 90791x1 unit for interview with psychologist; [phone] units of testing by psychometrician under psychologist's supervision; [phone] units for medical record review, scoring of neuropsychological tests, interpretation of test results, preparation of this report, and review of results to the patient by psychologist.      Thank you for your referral of [name]. Please feel free to contact me if you have any questions or concerns regarding this report.

## 2016-10-05 ENCOUNTER — Ambulatory Visit (INDEPENDENT_AMBULATORY_CARE_PROVIDER_SITE_OTHER): Payer: Medicare Other | Admitting: Psychology

## 2016-10-05 DIAGNOSIS — G250 Essential tremor: Secondary | ICD-10-CM

## 2016-10-05 DIAGNOSIS — F4323 Adjustment disorder with mixed anxiety and depressed mood: Secondary | ICD-10-CM

## 2016-10-05 NOTE — Patient Instructions (Addendum)
Fortunately, results of cognitive testing were entirely within normal limits and NOT indicative of a neurocognitive disorder or dementia.  Psychological testing demonstrated significant signs of depression and anxiety.  It is most likely the case that your cognitive symptoms in daily life are due to depression, anxiety and stress.  Below is some more information on this.    The effect of depression and anxiety on your cognitive functioning: . One of the typical symptoms of depression is difficulty concentrating and making decisions, and various types of anxiety also interfere with attention and concentration . Problems with attention and concentration can disrupt the process of learning and making new memories, which can make it seem like there is a problem with your memory. In your daily life, you may experience this disruption as forgetting names and appointments, misplacing items, and needing to make lists for shopping and errands. It may be harder for you to stay focused on tasks and feel as "sharp" as you did in the past.  . Also, when we are depressed or anxious, we often pay more attention to our difficulties (rather than our strengths) in our daily life, and this can make it seem to [place] like we are doing worse cognitively than we really are. . The cognitive aspects of depression and anxiety are sometimes observed as an identifiable pattern of poor performance on a neuropsychological evaluation, but it is also possible that all scores on an evaluation are within normal limits. . Regardless of the test scores, distress related to depression and anxiety can interfere with the ability to make use of your cognitive resources and function optimally across settings such as work or school, maintaining the home and responsibilities, and personal relationships. . Fortunately, there are treatments for depression and anxiety, and when mood improves, cognitive functioning in daily life often  improves. . Treatment options include psychotherapy, medications (e.g., antidepressants), and behavioral changes, such as increasing your involvement in enjoyable activities, increasing the amount of exercise you are getting, and maintaining a regular routine.     Recommendations/Plan: Based on the findings of the present evaluation, the following recommendations are offered:  1. No Tylenol PM (can try melatonin). Continue using CPAP. 2. Caregiver support group, [facility] 3. Increased caregiver/companion time for respite! 4. Continue individual counseling for depression/caregiver stress 5. Optimal control of hypertension, regular mental stimulation and social interaction for brain health 6. Re-evaluation in one year if demonstrating any new cognitive issues in daily life

## 2016-10-26 DIAGNOSIS — I10 Essential (primary) hypertension: Secondary | ICD-10-CM | POA: Diagnosis not present

## 2016-10-26 DIAGNOSIS — R197 Diarrhea, unspecified: Secondary | ICD-10-CM | POA: Diagnosis not present

## 2016-10-26 DIAGNOSIS — R7301 Impaired fasting glucose: Secondary | ICD-10-CM | POA: Diagnosis not present

## 2016-10-26 DIAGNOSIS — F329 Major depressive disorder, single episode, unspecified: Secondary | ICD-10-CM | POA: Diagnosis not present

## 2016-10-26 DIAGNOSIS — Z6841 Body Mass Index (BMI) 40.0 and over, adult: Secondary | ICD-10-CM | POA: Diagnosis not present

## 2016-10-26 DIAGNOSIS — G3184 Mild cognitive impairment, so stated: Secondary | ICD-10-CM | POA: Diagnosis not present

## 2016-11-08 DIAGNOSIS — Z6841 Body Mass Index (BMI) 40.0 and over, adult: Secondary | ICD-10-CM | POA: Diagnosis not present

## 2016-11-08 DIAGNOSIS — R198 Other specified symptoms and signs involving the digestive system and abdomen: Secondary | ICD-10-CM | POA: Diagnosis not present

## 2016-11-08 DIAGNOSIS — I10 Essential (primary) hypertension: Secondary | ICD-10-CM | POA: Diagnosis not present

## 2016-11-08 DIAGNOSIS — R197 Diarrhea, unspecified: Secondary | ICD-10-CM | POA: Diagnosis not present

## 2016-11-12 DIAGNOSIS — M4316 Spondylolisthesis, lumbar region: Secondary | ICD-10-CM | POA: Diagnosis not present

## 2016-11-12 DIAGNOSIS — M48062 Spinal stenosis, lumbar region with neurogenic claudication: Secondary | ICD-10-CM | POA: Diagnosis not present

## 2016-11-12 DIAGNOSIS — M5136 Other intervertebral disc degeneration, lumbar region: Secondary | ICD-10-CM | POA: Diagnosis not present

## 2016-11-12 DIAGNOSIS — M47816 Spondylosis without myelopathy or radiculopathy, lumbar region: Secondary | ICD-10-CM | POA: Diagnosis not present

## 2016-11-12 DIAGNOSIS — M5416 Radiculopathy, lumbar region: Secondary | ICD-10-CM | POA: Diagnosis not present

## 2016-11-13 DIAGNOSIS — M5416 Radiculopathy, lumbar region: Secondary | ICD-10-CM | POA: Insufficient documentation

## 2016-11-24 DIAGNOSIS — R197 Diarrhea, unspecified: Secondary | ICD-10-CM | POA: Diagnosis not present

## 2016-12-02 NOTE — Progress Notes (Signed)
[name] was seen today in the movement disorders clinic for neurologic consultation at the request of [name], MD.  The consultation is for the evaluation of tremor.  The records that were made available to me were reviewed.  Pt is R hand dominant.  Pt reports tremor for 20-30 years, but was mild when it started.  It started in both hands.  Her father had the same thing, as did her sister and her son now has the same thing.  It didn't really start to bother her until 5-6 years ago.  She started on primidone at that time, 50 mg and it helped.  It was increased to 100 mg and it helped as well.  No SE with the medication.  Over the last few years, tremor has continued to pick up, and she is noticing it in the legs now as well.  She notices that anxiety/stress will increase the tremor.   In January, the patient ran out of her primidone for 4-5 days and she really noted a deterioration in the tremor.  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  She does not necessarily doze unexpectedly, but is very sleepy during the day.  She has an appointment with [name] to discuss this.   07/29/14 update:  Last visit, I increased the patients primidone to 50 mg in the AM and she is on 100 mg at night.  She is doing better in terms of tremor.  She has good days and bad days and she knows that stress makes it worse.  She did see [name] since last visit and I reviewed those notes.  She had a PSG.  She had an AHI of 60.6 but this was all in supine position and AHI in lateral position was 0.0 (but I was unsure of how much time, if any, was spent in the lateral position).  CPAP at 6 was recommended but she admits that she "hasn't given in to that" and wants to talk with Dr. [name].    01/29/15 update:  Pt returns today.  She had called and wanted to try to increase the primidone to 100 mg bid but it made her too sleepy.  She is therefore just on 2 at night.  She generally is doing  okay, but her husband is physically not well and that stress will increase tremor.  Husband with dementia and she is caregiving.  She is seeking out advice from psychologist.  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  The records that were made available to me were reviewed.  Less sleepy during the day with CPAP.    08/01/15 update:  The patient returns today for follow-up.  I have reviewed records since her last visit.  She has a history of essential tremor.  She is currently on primidone, 100 mg at night.  Daytime dosages have caused sleepiness.  She did follow-up with [name] on 04/14/2015 in regards to her sleep apnea.  She has been very compliant with this.  She reports that she is doing worse.  She states that she can hardly get food in the mouth at times.  She has no asthma.  She states that she has tried to relieve stress (primary caregiver for her husband who is ill) through painting and when she tried to sign the painting today, she couldn't do it.  She has never been on a beta blocker.  She does state  that she has been ill recently and is on an aquatic's, but she is not on any prednisone.  11/04/15 update:  The patient is following up today regarding her tremor.  I have reviewed records since our last visit.  She is on primidone, 100 mg at night.  Last visit, I cautiously started her on Inderal LA, 80 mg daily.  I told her to call me with any side effects or problems.  I have not heard from her, but it appears that she has been in the emergency room multiple times.  She went to the emergency room on 08/11/2015 after a fall on her arm.  She states that with this one she misstepped.  A CT of the brain was performed and was negative.   A few days later she twisted wrong in the kitchen and fell.   On 08/24/2015 she went back to the emergency room with another fall.  She fell in the kitchen and hit her head on the dishwasher.  Her scalp wound was repaired with sutures and staples and she was advised to discontinue  her Inderal.  Of note was that her pulse was 66 in the emergency room and blood pressure was 152/72.  She followed up with her primary care physician and had an MRI of the brain on 09/07/2015.  I had the opportunity to review this.  It was nonacute.  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  Since those falls, she has gone to PT and gone to the gym.  She loved the PT but she states that sx's are so erratic that she may not know it was going to occur.  She went back to the emergency room on 08/28/2015 with complaints of knee pain, after her right leg gave out while walking in a restaurant.  She had one other fall where she tripped over a throw rug at a friends house.  She attributes the last 2 falls to back pain.    She saw [name] on 10/02/2015 with excessive daytime hypersomnolence.  [name] interrogated her machine and she was very compliant with her CPAP and there was no significant mask leak.  [name] suggested perhaps decreasing her Inderal and also started her on Provigil, 100 mg daily.  The patient didn't try the provigil yet as she was worried about SE that [name] talked to her about.  Separate from all of this, she c/o sweats.  She is unsure of how long she has been on cymbalta or how long the sweats have been.  Her pharmacist suggested that the sweats may have been from propranolol.  02/04/16 update:  The patient follows up today for essential tremor.  She remains on primidone, 100 mg at night.  Daytime dosages have made her sleepy, so she is only on nighttime dosages.  Last visit, I changed her from Inderal LA, 80 mg to propranolol 20 mg twice a day, primarily because she started falling after I added the Inderal LA.  She called me not long after I did that to let me know that while she was no longer falling, she could not tolerate the tremor and wanted to go back up on the medication.  I was very leery about this, but  we ultimately told her she can go up to 20 mg 3 times a day.  We called her shortly thereafter and she stated that she was only doing 3 times a  day as needed.  She states today, however, that she is only taking 20 mg at night unless she has a "lunch date" and then she may take an extra during the day.   Last visit, she was also complaining about significant diaphoresis and I told her that it could be the Cymbalta and told her that perhaps she could discuss this with her prescribing physician.  She actually thinks that lowering the propranolol helped the sweating but she is still sweating (but states that weather is bit cooler).  Unfortunately, despite the change in medication, she has had a few more falls.  The last fall was a week ago.  She had her cane and was coming out of the store and she "stubbed" her foot and fell.  No dizziness associated with the falls.  She is in her 2nd session of PT in [place].    09/02/16 update:  Pt f/u today.  This patient is accompanied in the office by her daughter who supplements the history.  The records that were made available to me were reviewed since last visit.  On primidone - 100 mg q hs but she d/c her propranolol since last visit.  Last visit, we discussed EMG for peripheral neuropathy and the patient called back the day after the last visit and wanted to schedule that but she ultimately cancelled the appt.  She fell in March and the only other fall was on the grass a few weeks ago at church as she was on uneven terrain.  She continues to caregive and is stressed but is seeing her counselor.  Reports that in April she had a DVT and is now on eloquis.  Not long after starting on eloquis, she noted a sharp shooting pain in the L face and L eye.  States that she saw PCP and did not have suspicion for temporal arteritis.  It generally has gotten better and is not coming often now.  She cannot remember the last time that she had that.  The patient had an MRI of the brain on  08/31/16 that I had the opportunity to review.  There was significant/severe white matter disease but it had not changed since 2016.  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  I reviewed this with pt/daughter.  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  She states that she thinks that last time she had angiogram was at her AVM coiling in the 1990's at [facility].    Still worries about her memory but does think that stress is affecting that.  Husband with dementia.  Looking into moving into assisted living.  12/14/16 update:  Patient f/u today.  Last visit, I did have to stop her primidone, just while she was on Eliquis.  She is now off of the Eliquis and restarted her primidone.  "Boy am I glad to be back on that."  She is back on 100 mg at night of the primidone.  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  She was very worried about memory  change last visit and subsequently underwent neuropsych testing on 09/22/2016.  She has seen Dr. Si Raider for follow-up and results.  As suspected, there was no evidence of dementia.  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  States that she was so glad that she did that as she just needed confirmation that she did not have dementia.  States that she has colitis and was told that it was from anxiety.  Had diarrhea x 4-5 weeks.  Getting ready to move to [facility] at independent assisted living and that has been stressful.  Husbands health declining  both physically and mentally.  Pt is seeing counselor, Jackelyn Hoehn at [facility].  When asked if lexapro was helping, she states that her psychologist recommended a change but she thinks that changing previously didn't help so she is Chiropodist.  She does think that the lexapro is helping.  She was previously on effexor.  Wellbutrin caused SE.  Been on cymbalta in the past but caused sweats.     ALLERGIES:   Allergies  Allergen Reactions  . Buprenorphine Hcl Nausea And Vomiting  . Codeine Nausea And Vomiting  . Morphine And Related Nausea And Vomiting    CURRENT MEDICATIONS:  Current Outpatient Prescriptions on File Prior to Visit  Medication Sig Dispense Refill  . Cholecalciferol (VITAMIN D-3) 1000 UNITS CAPS Take 1,000 Units by mouth daily.     Marland Kitchen escitalopram (LEXAPRO) 20 MG tablet     . esomeprazole (NEXIUM) 40 MG capsule     . mirabegron ER (MYRBETRIQ) 50 MG TB24 tablet Take 50 mg by mouth.    . primidone (MYSOLINE) 50 MG tablet Take 2 tablets (100 mg total) by mouth at bedtime. 180 tablet 1  . traMADol (ULTRAM) 50 MG tablet Take 1 tablet (50 mg total) by mouth every 6 (six) hours as needed for severe pain. 15 tablet 0  . trimethoprim (TRIMPEX) 100 MG tablet Take 100 mg by mouth daily.     . valsartan (DIOVAN) 160 MG tablet Take 160 mg by mouth daily.      No current facility-administered medications on file prior to visit.     PAST MEDICAL HISTORY:   Past Medical History:  Diagnosis Date  . 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 Gallstones   . GERD (gastroesophageal reflux disease)   . Hypertension   . Osteoporosis   . Wears glasses     PAST SURGICAL HISTORY:   Past Surgical History:  Procedure Laterality Date  . ABDOMINAL HYSTERECTOMY  1983  . arterial vascular fistula  2004   coiled at [place]  [phone]  . CARPAL TUNNEL RELEASE  1992   right arm  . CHOLECYSTECTOMY N/A 03/29/2013   Procedure: LAPAROSCOPIC CHOLECYSTECTOMY WITH INTRAOPERATIVE  CHOLANGIOGRAM;  Surgeon: Haywood Lasso, MD;  Location: [facility];  Service: General;  Laterality: N/A;  . EUS N/A 03/09/2013   Procedure: UPPER ENDOSCOPIC ULTRASOUND (EUS) LINEAR;  Surgeon: Beryle Beams, MD;  Location: [facility];  Service: Endoscopy;  Laterality: N/A;  . LUMBAR LAMINECTOMY/DECOMPRESSION MICRODISCECTOMY  11/30/2012   Procedure: LUMBAR LAMINECTOMY/DECOMPRESSION MICRODISCECTOMY 2 LEVELS;  Surgeon: Magnus Sinning, MD;  Location: [facility];  Service: Orthopedics;  Laterality: N/A;  DECOMPRESSION LAMINECTOMY L4-L5, L5-S1 CENTRAL   . ROTATOR CUFF REPAIR  2004   two on right, one on left  . TRIGGER FINGER RELEASE     right hand    SOCIAL HISTORY:  Social History   Social History  . Marital status: Married    Spouse name: Richard  . Number of children: 2  . Years of education: 97   Occupational History  . interior designer     partially retired   Social History Main Topics  . Smoking status: Former Smoker    Packs/day: 0.50    Types: Cigarettes    Quit date: 07/11/1970  . Smokeless tobacco: Never Used  . Alcohol use 0.0 oz/week     Comment: wine occasionally  . Drug use: No  . Sexual activity: Not Currently   Other Topics Concern  . Not on file   Social History Narrative   Patient is right handed, resides with spouse in a home.   1 cup of coffee a day     FAMILY HISTORY:   Family Status  Relation Status  . Mother Deceased at age 76   sarcoma  . Father Deceased   complications from broken hip, ET  . Sister Deceased   blood clot, ET  . Son Alive   healthy,   . Daughter Alive   healthy    ROS:  A complete 10 system review of systems was obtained and was unremarkable apart from what is mentioned above.  PHYSICAL EXAMINATION:    VITALS:   Vitals:   12/14/16 1126  BP: 140/80  Pulse: 85  Weight: 214 lb (97.1 kg)  Height: 5' (1.524 m)    GEN:  The patient appears stated age and is in NAD. HEENT:  Normocephalic,  atraumatic.  The mucous membranes are moist. The superficial temporal arteries are without ropiness or tenderness. CV:  RRR Lungs:  CTAB Neck/HEME:  There are no carotid bruits bilaterally.  Neurological examination:  Orientation: The patient is alert and oriented x3. Tearful/emotional today Cranial nerves: There is good facial symmetry.  Extraocular muscles are intact. The visual fields are full to confrontational testing. The speech is fluent and clear. Soft palate rises symmetrically and there is no tongue deviation. Hearing is intact to conversational tone. Sensation: Sensation is intact to light touch.   Vibration is decreased at the bilateral big toe. There is no extinction with double simultaneous stimulation. There is no sensory dermatomal level identified. Motor: Strength is 5/5 in the bilateral upper and lower extremities.   Shoulder shrug is equal and symmetric.  There is no pronator drift.   Movement examination: Tone: There is normal tone in the bilateral upper extremities.  The tone in the lower extremities is normal.  Abnormal movements: There is mild bilateral independent resting tremor.  There is tremor of the outstretched hands that increases with intention bilaterally.  Coordination:  There is no significant decremation with RAM's, with any form of rapid alternating movement including alternating supination and pronation of the forearm, hand opening and closing, finger taps, heel taps and toe taps. Gait and Station: The patient has a wide based gait and is antalgic.    ASSESSMENT/PLAN:  1.  Essential tremor.  - She had difficulty with daytime hypersomnolence with daytime dosages of primidone.  She is on 100 mg at night.   -Initially, we thought that her falls were related to Inderal LA, 80 mg daily but ultimately decided that they were not.  She has taken herself off of it for now and will leave off.   2.  Falls  -fall frequency markedly improved.  Using cane at all times  per pt (daughter not so sure)  -has evidence of peripheral neuropathy.  Cancelled her EMG in the past. 3.  Hx AVM repair with coiling  -She does have an upgoing toe on the right, which I think is likely just a result of her history of AVM, which she reports was on the left.  I see nothing else focal or lateralizing on her examination today, so decided to hold off on any further neuroimaging.    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 4.  Trigeminal neuralgia  -L face pain sounds c/w TN.  Pt doing better and wants no med.  Warned her that it may come back.  We discussed the diagnosis as well as pathophysiology of the disease.  We discussed treatment options as well as prognostic indicators.  Patient education was provided. 4.  OSAS  -On CPAP faithfully and doing well.  Still has EDS and seeing [name].  [name] gave her provigil but pt hasnt' tried it yet.  Pt wishes to transition CPAP care here and that is okay. 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  -Admits that she has been somewhat more depressed lately, but that is primarily because of falls and sweating.  She is already seeing psychology.  Under more stress with caregiving for her husband who is ill.  Moving to [place] but she isn't sure that she will be able to care for him.  Discussed methods to release stress.  -I think that she could benefit from psychiatry to help get her meds straighted around.  She was initially resistant to that but then agreeable.  Will call her psychologist, Dr. Jackelyn Hoehn and see if he has psychiatrist that he prefers to work with.  -She had neuropsych testing on 09/22/2016.   As suspected, there was no evidence of dementia.  There was evidence of an adjustment disorder with mixed anxiety and  depression. 6.  Diaphoresis  -Improved off of cymbalta and wellbutrin 7.  Much greater than 50% of this visit was spent in counseling and coordinating care.  Total face to face time:  40 min

## 2016-12-03 DIAGNOSIS — R8299 Other abnormal findings in urine: Secondary | ICD-10-CM | POA: Diagnosis not present

## 2016-12-03 DIAGNOSIS — N390 Urinary tract infection, site not specified: Secondary | ICD-10-CM | POA: Diagnosis not present

## 2016-12-08 DIAGNOSIS — M5136 Other intervertebral disc degeneration, lumbar region: Secondary | ICD-10-CM | POA: Diagnosis not present

## 2016-12-08 DIAGNOSIS — M5416 Radiculopathy, lumbar region: Secondary | ICD-10-CM | POA: Diagnosis not present

## 2016-12-08 DIAGNOSIS — M1288 Other specific arthropathies, not elsewhere classified, other specified site: Secondary | ICD-10-CM | POA: Diagnosis not present

## 2016-12-14 ENCOUNTER — Ambulatory Visit (INDEPENDENT_AMBULATORY_CARE_PROVIDER_SITE_OTHER): Payer: Medicare Other | Admitting: Neurology

## 2016-12-14 ENCOUNTER — Telehealth: Payer: Self-pay | Admitting: Neurology

## 2016-12-14 ENCOUNTER — Encounter: Payer: Self-pay | Admitting: Neurology

## 2016-12-14 VITALS — BP 140/80 | HR 85 | Ht 60.0 in | Wt 214.0 lb

## 2016-12-14 DIAGNOSIS — F331 Major depressive disorder, recurrent, moderate: Secondary | ICD-10-CM | POA: Diagnosis not present

## 2016-12-14 DIAGNOSIS — G250 Essential tremor: Secondary | ICD-10-CM | POA: Diagnosis not present

## 2016-12-14 DIAGNOSIS — F32A Depression, unspecified: Secondary | ICD-10-CM

## 2016-12-14 DIAGNOSIS — R413 Other amnesia: Secondary | ICD-10-CM | POA: Diagnosis not present

## 2016-12-14 DIAGNOSIS — F329 Major depressive disorder, single episode, unspecified: Secondary | ICD-10-CM

## 2016-12-14 NOTE — Telephone Encounter (Signed)
Called Dr. Mare Ferrari at [phone] and left message for them to call back with information.

## 2016-12-14 NOTE — Telephone Encounter (Signed)
Made patient aware of information. Referral entered.

## 2016-12-14 NOTE — Telephone Encounter (Signed)
I'm pretty sure that Dr. Casimiro Needle does.  Go ahead and give her that information and make referral if needed.

## 2016-12-14 NOTE — Telephone Encounter (Signed)
Spoke with Dr. Mare Ferrari who wasn't sure about insurance but He uses Dr. Toy Care, Dr. Casimiro Needle, Dr. Clovis Pu, Dr. Louretta Shorten, and Dr. Caprice Beaver.  Dr. Louretta Shorten treats children.  Dr. Caprice Beaver closed her practice.  Dr. Toy Care and Dr. Clovis Pu do not accept Medicare.  Tried to call the billing office with Dr. Casimiro Needle to see if they accept medicare and received no answer. Please advise.

## 2016-12-14 NOTE — Telephone Encounter (Signed)
-----   Message from [place], DO sent at 12/14/2016 12:09 PM EST ----- Call Domenic Schwab office and see if he has psychiatrist that takes [facility] that he prefers to work with

## 2016-12-22 DIAGNOSIS — R198 Other specified symptoms and signs involving the digestive system and abdomen: Secondary | ICD-10-CM | POA: Diagnosis not present

## 2016-12-22 DIAGNOSIS — R358 Other polyuria: Secondary | ICD-10-CM | POA: Diagnosis not present

## 2016-12-22 DIAGNOSIS — R300 Dysuria: Secondary | ICD-10-CM | POA: Diagnosis not present

## 2016-12-22 DIAGNOSIS — R197 Diarrhea, unspecified: Secondary | ICD-10-CM | POA: Diagnosis not present

## 2016-12-30 ENCOUNTER — Other Ambulatory Visit: Payer: Self-pay | Admitting: Internal Medicine

## 2016-12-30 DIAGNOSIS — Z1231 Encounter for screening mammogram for malignant neoplasm of breast: Secondary | ICD-10-CM

## 2017-01-10 DIAGNOSIS — N390 Urinary tract infection, site not specified: Secondary | ICD-10-CM | POA: Diagnosis not present

## 2017-01-12 ENCOUNTER — Other Ambulatory Visit: Payer: Self-pay | Admitting: Internal Medicine

## 2017-01-12 DIAGNOSIS — N390 Urinary tract infection, site not specified: Secondary | ICD-10-CM

## 2017-01-12 DIAGNOSIS — N3000 Acute cystitis without hematuria: Secondary | ICD-10-CM

## 2017-01-13 ENCOUNTER — Encounter: Payer: Self-pay | Admitting: Internal Medicine

## 2017-01-14 ENCOUNTER — Ambulatory Visit
Admission: RE | Admit: 2017-01-14 | Discharge: 2017-01-14 | Disposition: A | Discharge status: Home / Self Care | Payer: Medicare Other | Source: Ambulatory Visit | Attending: Internal Medicine | Admitting: Internal Medicine

## 2017-01-14 DIAGNOSIS — N390 Urinary tract infection, site not specified: Secondary | ICD-10-CM

## 2017-01-18 ENCOUNTER — Ambulatory Visit (INDEPENDENT_AMBULATORY_CARE_PROVIDER_SITE_OTHER): Payer: Medicare Other | Admitting: Internal Medicine

## 2017-01-18 ENCOUNTER — Encounter: Payer: Self-pay | Admitting: Internal Medicine

## 2017-01-18 VITALS — BP 140/72 | HR 84 | Ht 60.0 in | Wt 210.0 lb

## 2017-01-18 DIAGNOSIS — A0472 Enterocolitis due to Clostridium difficile, not specified as recurrent: Secondary | ICD-10-CM

## 2017-01-18 MED ORDER — DICYCLOMINE HCL 10 MG PO CAPS: ORAL_CAPSULE | ORAL | 1 refills | Status: DC

## 2017-01-18 NOTE — Progress Notes (Signed)
Patient ID: [name], female   DOB: [birth year], 76 y.o.   MRN: [id number] HPI: [name] is a 76 year old female with a recent history of C. difficile, history of colonic diverticulosis, history of GERD, hypertension, osteoporosis, and DVT now having completed 6 months of anticoagulation therapy who is seen in consultation at the request of Dr. [name] to evaluate recent C. difficile colitis. She is here alone today.  She reports that over the last 2 months she developed soft stools which were urgent and explosive. This was associated with lower abdominal crampy pain. She was treated for presumed diverticulitis in late November. She recalls being treated with Flagyl, Cipro and Augmentin over the course of a few weeks. She was also treated for UTI around this time. After antibiotics symptoms never really improved and stools remained loose very soft, urgent and explosive. She describes having to "accidents". Crampy lower abdominal pain continued. She denies seeing blood in her stool or melena. She has had recent increase in lower gas.   She is currently taking her second 14 day course of oral vancomycin therapy. She had heard the word C. difficile before but was not certain she was diagnosed with it. Records at the time of our encounter were not available. She reports she took 14 days of oral vancomycin and symptoms improved but within a few days of stopping symptoms started to recur. She is about 7 days to her second 14 day course. Stools are "forming up" but not yet normal. Her lower abdominal crampy discomfort has improved and resolved.  She is having mid and lower back pain which she attributes to a pulled muscle from twisting. No fevers or chills. No nausea or vomiting. Good appetite. No trouble swallowing.  She was taking probiotic Florastor initially then align because she ran out. No current probiotic  She's had prior colonoscopy the last around 2012. No report of prior colon  polyps.   Past Medical History:  Diagnosis Date  . 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 Benign familial tremor   . Diverticulosis   . DVT (deep venous thrombosis) (HCC)    left leg  . Gallstones   . GERD (gastroesophageal reflux disease)   . Hypertension   . Osteoporosis   . Wears glasses     Past Surgical History:  Procedure Laterality Date  . ABDOMINAL HYSTERECTOMY  1983  . arterial vascular fistula  2004   coiled at [place]  [phone]  . CARPAL TUNNEL RELEASE  right  . CHOLECYSTECTOMY N/A 03/29/2013   Procedure: LAPAROSCOPIC CHOLECYSTECTOMY WITH INTRAOPERATIVE CHOLANGIOGRAM;  Surgeon: Haywood Lasso, MD;  Location: [facility];  Service: General;  Laterality: N/A;  . EUS N/A 03/09/2013   Procedure: UPPER ENDOSCOPIC ULTRASOUND (EUS) LINEAR;  Surgeon: Beryle Beams, MD;  Location: [facility];  Service: Endoscopy;  Laterality: N/A;  . LUMBAR LAMINECTOMY/DECOMPRESSION MICRODISCECTOMY  11/30/2012   Procedure: LUMBAR LAMINECTOMY/DECOMPRESSION MICRODISCECTOMY 2 LEVELS;  Surgeon: Magnus Sinning, MD;  Location: [facility];  Service: Orthopedics;  Laterality: N/A;  DECOMPRESSION LAMINECTOMY L4-L5, L5-S1 CENTRAL   . ROTATOR CUFF REPAIR Bilateral 2004   two on right, one on left  . TRIGGER FINGER RELEASE Right     hand    Outpatient Medications Prior to Visit  Medication Sig Dispense Refill  . Cholecalciferol (VITAMIN D-3) 1000 UNITS CAPS Take 1,000 Units by mouth daily.     Marland Kitchen escitalopram (LEXAPRO) 20 MG tablet     .  esomeprazole (NEXIUM) 40 MG capsule     . mirabegron ER (MYRBETRIQ) 50 MG TB24 tablet Take 50 mg by mouth daily.     . primidone (MYSOLINE) 50 MG tablet Take 2 tablets (100 mg total) by mouth at bedtime. 180 tablet 1  . traMADol (ULTRAM) 50 MG tablet Take 1 tablet (50 mg total) by mouth every 6 (six) hours as needed for severe pain. 15 tablet 0  . trimethoprim (TRIMPEX) 100 MG tablet Take 100 mg by mouth daily.     .  valsartan (DIOVAN) 160 MG tablet Take 160 mg by mouth daily.     Marland Kitchen amoxicillin-clavulanate (AUGMENTIN) 875-125 MG tablet     . metroNIDAZOLE (FLAGYL) 500 MG tablet      No facility-administered medications prior to visit.     Allergies  Allergen Reactions  . Buprenorphine Hcl Nausea And Vomiting  . Codeine Nausea And Vomiting  . Morphine And Related Nausea And Vomiting    Family History  Problem Relation Age of Onset  . Other Mother     sarcoma  . Other Father     broken hip  . Dementia Father   . Pneumonia Father   . Clotting disorder Sister     Social History  Substance Use Topics  . Smoking status: Former Smoker    Packs/day: 0.50    Types: Cigarettes    Quit date: 07/11/1970  . Smokeless tobacco: Never Used  . Alcohol use 0.0 oz/week     Comment: wine occasionally    ROS: As per history of present illness, otherwise negative  BP 140/72 (BP Location: Left Arm, Patient Position: Sitting, Cuff Size: Normal)   Pulse 84   Ht 5' (1.524 m) Comment: height measured without shoes  Wt 210 lb (95.3 kg)   BMI 41.01 kg/m  Constitutional: Well-developed and well-nourished. No distress. HEENT: Normocephalic and atraumatic. Oropharynx is clear and moist. No oropharyngeal exudate. Conjunctivae are normal.  No scleral icterus. Neck: Neck supple. Trachea midline. Cardiovascular: Normal rate, regular rhythm and intact distal pulses. Pulmonary/chest: Effort normal and breath sounds normal. No wheezing, rales or rhonchi. Abdominal: Soft, obese, nontender, nondistended. Bowel sounds active throughout.  Extremities: no clubbing, cyanosis, or edema Neurological: Alert and oriented to person place and time. Skin: Skin is warm and dry.  Psychiatric: Normal mood and affect. Behavior is normal.  Per Dr. Raul Del clinic note, last colonoscopy 06/23/2011   ASSESSMENT/PLAN: 76 year old female with a recent history of C. difficile, history of colonic diverticulosis, history of GERD,  hypertension, osteoporosis, and DVT now having completed 6 months of anticoagulation therapy who is seen in consultation at the request of Dr. [name] to evaluate recent C. difficile colitis.  --Records obtained after our encounter a before note dictation  1. C. difficile colitis -- I have received records including a positive C. difficile toxin dated 12/23/2016. With the knowledge of positive C. difficile, her symptoms are consistent with C. difficile colitis. She seems to be improving on oral vancomycin. She is on her second 14 day course. I recommended that she complete the course as directed. I also asked that she resume Florastor 250 mg twice a day 1 month. She is instructed to notify me if loose stools, urgency, or lower abdominal pain return after stopping oral vancomycin. Should this occur, would recommend repeat C. difficile PCR stat. Prescription for Bentyl 10 mg 3 times a day when necessary for lower abdominal crampy discomfort and/or loose stools. --If symptoms recur and C. difficile negative consider colonoscopy  Records requested from prior colonoscopy. We'll have her return in 4-6 weeks with me or advanced practitioner for follow-up and to ensure she is improving.   [id number] [place], [place] [address]

## 2017-01-18 NOTE — Patient Instructions (Addendum)
If you are age 78 or older, your body mass index should be between 23-30. Your Body mass index is 41.01 kg/m. If this is out of the aforementioned range listed, please consider follow up with your Primary Care Provider.  If you are age 25 or younger, your body mass index should be between 19-25. Your Body mass index is 41.01 kg/m. If this is out of the aformentioned range listed, please consider follow up with your Primary Care Provider.   Complete Vancomycin as directed.  Please purchase the following medications over the counter and take as directed:  Florastor 250mg  twice daily  We have sent the following medications to your pharmacy for you to pick up at your convenience:  Bentyl 10mg  three times daily as needed.  Call Dr Hilarie Fredrickson if symptoms recur after stopping Vancomycin.  Follow up with Nicoletta Ba, Tuesday 02/22/17 at 10 am.

## 2017-01-19 DIAGNOSIS — N3946 Mixed incontinence: Secondary | ICD-10-CM | POA: Diagnosis not present

## 2017-01-19 DIAGNOSIS — R350 Frequency of micturition: Secondary | ICD-10-CM | POA: Diagnosis not present

## 2017-01-19 DIAGNOSIS — N3020 Other chronic cystitis without hematuria: Secondary | ICD-10-CM | POA: Diagnosis not present

## 2017-01-20 ENCOUNTER — Telehealth: Payer: Self-pay | Admitting: *Deleted

## 2017-01-20 NOTE — Telephone Encounter (Signed)
Dr Hilarie Fredrickson has received patient's previous GI records from [place]. Per Dr Hilarie Fredrickson, " Colon reviewed, 2012 age 76. 1 polyp, distal, hyperplastic. Recall would be 2022 at age 58, thus no screening recall needed. If diarrhea fails to resolve after c.diff treated, can consider diagnostic colonoscopy. JMP"  I have spoken to patient to advise of the information above and she verbalizes understanding.

## 2017-02-03 ENCOUNTER — Telehealth: Payer: Self-pay | Admitting: Internal Medicine

## 2017-02-03 ENCOUNTER — Other Ambulatory Visit: Payer: Self-pay

## 2017-02-03 DIAGNOSIS — R197 Diarrhea, unspecified: Secondary | ICD-10-CM

## 2017-02-03 MED ORDER — VANCOMYCIN HCL 250 MG PO CAPS: 250.0000 mg | ORAL_CAPSULE | Freq: Four times a day (QID) | ORAL | 0 refills | Status: DC

## 2017-02-03 NOTE — Telephone Encounter (Signed)
Spoke with pt and she is aware, order in epic for lab and script sent to pharmacy.

## 2017-02-03 NOTE — Telephone Encounter (Signed)
Pt states she finished the vancomycin several days ago and today she has started having very loose, uncontrollable stools. Please advise.

## 2017-02-03 NOTE — Telephone Encounter (Signed)
Repeat C diff PCR, send stat Rx for Vancomycin oral which can be started AFTER she submits the new stool sample Plan 250 mg QID x 14 day, and will taper but await repeat PCR

## 2017-02-04 ENCOUNTER — Telehealth: Payer: Self-pay | Admitting: Internal Medicine

## 2017-02-04 ENCOUNTER — Other Ambulatory Visit: Payer: Medicare Other

## 2017-02-04 DIAGNOSIS — R197 Diarrhea, unspecified: Secondary | ICD-10-CM | POA: Diagnosis not present

## 2017-02-04 NOTE — Telephone Encounter (Signed)
Discussed with pt that she does not need to come to the office she just needs to return the stool specimen to the lab. Pt is not supposed to start the vanco until she returns stool specimen. Pt verbalized understanding.

## 2017-02-05 ENCOUNTER — Telehealth: Payer: Self-pay | Admitting: Gastroenterology

## 2017-02-05 LAB — CLOSTRIDIUM DIFFICILE BY PCR: Toxigenic C. Difficile by PCR: DETECTED — CR

## 2017-02-05 NOTE — Telephone Encounter (Signed)
C diff positive on your patient. Lab called me 4pm Saturday, but looks like resulted in Epic yesterday afternoon.  I do not see that the patient was made aware.  Your notes indicate this was expected , and that the patient was told to resume vancomycin after submitting the stool specimen.  I was unable to reach her on either number listed for her.

## 2017-02-07 ENCOUNTER — Other Ambulatory Visit: Payer: Self-pay

## 2017-02-07 MED ORDER — VANCOMYCIN HCL 250 MG PO CAPS: ORAL_CAPSULE | ORAL | 0 refills | Status: DC

## 2017-02-10 ENCOUNTER — Other Ambulatory Visit: Payer: Self-pay | Admitting: Internal Medicine

## 2017-02-15 DIAGNOSIS — R350 Frequency of micturition: Secondary | ICD-10-CM | POA: Diagnosis not present

## 2017-02-15 DIAGNOSIS — N3946 Mixed incontinence: Secondary | ICD-10-CM | POA: Diagnosis not present

## 2017-02-16 DIAGNOSIS — L720 Epidermal cyst: Secondary | ICD-10-CM | POA: Diagnosis not present

## 2017-02-16 DIAGNOSIS — L821 Other seborrheic keratosis: Secondary | ICD-10-CM | POA: Diagnosis not present

## 2017-02-16 DIAGNOSIS — Z85828 Personal history of other malignant neoplasm of skin: Secondary | ICD-10-CM | POA: Diagnosis not present

## 2017-02-16 DIAGNOSIS — L814 Other melanin hyperpigmentation: Secondary | ICD-10-CM | POA: Diagnosis not present

## 2017-02-22 ENCOUNTER — Ambulatory Visit: Payer: Medicare Other | Admitting: Physician Assistant

## 2017-03-01 DIAGNOSIS — G959 Disease of spinal cord, unspecified: Secondary | ICD-10-CM | POA: Diagnosis not present

## 2017-03-01 DIAGNOSIS — M545 Low back pain: Secondary | ICD-10-CM | POA: Diagnosis not present

## 2017-03-03 ENCOUNTER — Ambulatory Visit
Admission: RE | Admit: 2017-03-03 | Discharge: 2017-03-03 | Disposition: A | Discharge status: Home / Self Care | Payer: Medicare Other | Source: Ambulatory Visit | Attending: Internal Medicine | Admitting: Internal Medicine

## 2017-03-03 DIAGNOSIS — Z1231 Encounter for screening mammogram for malignant neoplasm of breast: Secondary | ICD-10-CM | POA: Diagnosis not present

## 2017-03-08 DIAGNOSIS — M545 Low back pain: Secondary | ICD-10-CM | POA: Diagnosis not present

## 2017-03-08 DIAGNOSIS — M48062 Spinal stenosis, lumbar region with neurogenic claudication: Secondary | ICD-10-CM | POA: Diagnosis not present

## 2017-03-08 DIAGNOSIS — R262 Difficulty in walking, not elsewhere classified: Secondary | ICD-10-CM | POA: Diagnosis not present

## 2017-03-08 DIAGNOSIS — M4316 Spondylolisthesis, lumbar region: Secondary | ICD-10-CM | POA: Diagnosis not present

## 2017-03-09 DIAGNOSIS — R262 Difficulty in walking, not elsewhere classified: Secondary | ICD-10-CM | POA: Diagnosis not present

## 2017-03-09 DIAGNOSIS — M48062 Spinal stenosis, lumbar region with neurogenic claudication: Secondary | ICD-10-CM | POA: Diagnosis not present

## 2017-03-09 DIAGNOSIS — M4316 Spondylolisthesis, lumbar region: Secondary | ICD-10-CM | POA: Diagnosis not present

## 2017-03-09 DIAGNOSIS — M545 Low back pain: Secondary | ICD-10-CM | POA: Diagnosis not present

## 2017-03-14 DIAGNOSIS — M4316 Spondylolisthesis, lumbar region: Secondary | ICD-10-CM | POA: Diagnosis not present

## 2017-03-14 DIAGNOSIS — R262 Difficulty in walking, not elsewhere classified: Secondary | ICD-10-CM | POA: Diagnosis not present

## 2017-03-14 DIAGNOSIS — M545 Low back pain: Secondary | ICD-10-CM | POA: Diagnosis not present

## 2017-03-14 DIAGNOSIS — M48062 Spinal stenosis, lumbar region with neurogenic claudication: Secondary | ICD-10-CM | POA: Diagnosis not present

## 2017-03-17 DIAGNOSIS — M542 Cervicalgia: Secondary | ICD-10-CM | POA: Diagnosis not present

## 2017-03-17 DIAGNOSIS — M545 Low back pain: Secondary | ICD-10-CM | POA: Diagnosis not present

## 2017-03-18 DIAGNOSIS — R262 Difficulty in walking, not elsewhere classified: Secondary | ICD-10-CM | POA: Diagnosis not present

## 2017-03-18 DIAGNOSIS — M4316 Spondylolisthesis, lumbar region: Secondary | ICD-10-CM | POA: Diagnosis not present

## 2017-03-18 DIAGNOSIS — M545 Low back pain: Secondary | ICD-10-CM | POA: Diagnosis not present

## 2017-03-18 DIAGNOSIS — M48062 Spinal stenosis, lumbar region with neurogenic claudication: Secondary | ICD-10-CM | POA: Diagnosis not present

## 2017-03-21 DIAGNOSIS — R262 Difficulty in walking, not elsewhere classified: Secondary | ICD-10-CM | POA: Diagnosis not present

## 2017-03-21 DIAGNOSIS — M545 Low back pain: Secondary | ICD-10-CM | POA: Diagnosis not present

## 2017-03-21 DIAGNOSIS — M4696 Unspecified inflammatory spondylopathy, lumbar region: Secondary | ICD-10-CM | POA: Diagnosis not present

## 2017-03-21 DIAGNOSIS — M4316 Spondylolisthesis, lumbar region: Secondary | ICD-10-CM | POA: Diagnosis not present

## 2017-03-21 DIAGNOSIS — M48062 Spinal stenosis, lumbar region with neurogenic claudication: Secondary | ICD-10-CM | POA: Diagnosis not present

## 2017-03-22 DIAGNOSIS — R350 Frequency of micturition: Secondary | ICD-10-CM | POA: Diagnosis not present

## 2017-03-22 DIAGNOSIS — N3946 Mixed incontinence: Secondary | ICD-10-CM | POA: Diagnosis not present

## 2017-03-23 DIAGNOSIS — M47816 Spondylosis without myelopathy or radiculopathy, lumbar region: Secondary | ICD-10-CM | POA: Diagnosis not present

## 2017-03-24 DIAGNOSIS — M48062 Spinal stenosis, lumbar region with neurogenic claudication: Secondary | ICD-10-CM | POA: Diagnosis not present

## 2017-03-24 DIAGNOSIS — M545 Low back pain: Secondary | ICD-10-CM | POA: Diagnosis not present

## 2017-03-24 DIAGNOSIS — M4316 Spondylolisthesis, lumbar region: Secondary | ICD-10-CM | POA: Diagnosis not present

## 2017-03-24 DIAGNOSIS — R262 Difficulty in walking, not elsewhere classified: Secondary | ICD-10-CM | POA: Diagnosis not present

## 2017-03-28 ENCOUNTER — Other Ambulatory Visit: Payer: Self-pay

## 2017-03-28 ENCOUNTER — Telehealth: Payer: Self-pay | Admitting: Internal Medicine

## 2017-03-28 DIAGNOSIS — A0471 Enterocolitis due to Clostridium difficile, recurrent: Secondary | ICD-10-CM

## 2017-03-28 NOTE — Telephone Encounter (Signed)
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 States she "did not feel well" on 03/25/17. Reports she started having diarrhea again on Saturday 4/14 and really bad on 4/15. She reports she had a refill on her vanc prescription and her son refilled it for her. She took 2 pills yesterday and states so far she has not had diarrhea this morning. Please advise.

## 2017-03-28 NOTE — Telephone Encounter (Signed)
Would continue oral vancomycin 250 mg QID for 21 days ID consult with Dr. Baxter Flattery (ASAP) to discuss and arrange FMT given now multiple C diff recurrences

## 2017-03-28 NOTE — Telephone Encounter (Signed)
Pt will take the vancomycin 250mg  QID for 21 days. Referral entered in epic for ID appt. Pt knows to expect a phone call from Dr. Storm Frisk office.

## 2017-03-30 DIAGNOSIS — R262 Difficulty in walking, not elsewhere classified: Secondary | ICD-10-CM | POA: Diagnosis not present

## 2017-03-30 DIAGNOSIS — M545 Low back pain: Secondary | ICD-10-CM | POA: Diagnosis not present

## 2017-03-30 DIAGNOSIS — M48062 Spinal stenosis, lumbar region with neurogenic claudication: Secondary | ICD-10-CM | POA: Diagnosis not present

## 2017-03-30 DIAGNOSIS — M4316 Spondylolisthesis, lumbar region: Secondary | ICD-10-CM | POA: Diagnosis not present

## 2017-04-01 DIAGNOSIS — M545 Low back pain: Secondary | ICD-10-CM | POA: Diagnosis not present

## 2017-04-01 DIAGNOSIS — M48062 Spinal stenosis, lumbar region with neurogenic claudication: Secondary | ICD-10-CM | POA: Diagnosis not present

## 2017-04-01 DIAGNOSIS — R262 Difficulty in walking, not elsewhere classified: Secondary | ICD-10-CM | POA: Diagnosis not present

## 2017-04-01 DIAGNOSIS — M4316 Spondylolisthesis, lumbar region: Secondary | ICD-10-CM | POA: Diagnosis not present

## 2017-04-12 NOTE — Progress Notes (Signed)
[name] was seen today in the movement disorders clinic for neurologic consultation at the request of [name], MD.  The consultation is for the evaluation of tremor.  The records that were made available to me were reviewed.  Pt is R hand dominant.  Pt reports tremor for 20-30 years, but was mild when it started.  It started in both hands.  Her father had the same thing, as did her sister and her son now has the same thing.  It didn't really start to bother her until 5-6 years ago.  She started on primidone at that time, 50 mg and it helped.  It was increased to 100 mg and it helped as well.  No SE with the medication.  Over the last few years, tremor has continued to pick up, and she is noticing it in the legs now as well.  She notices that anxiety/stress will increase the tremor.   In January, the patient ran out of her primidone for 4-5 days and she really noted a deterioration in the tremor.  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  She does not necessarily doze unexpectedly, but is very sleepy during the day.  She has an appointment with [name] to discuss this.   07/29/14 update:  Last visit, I increased the patients primidone to 50 mg in the AM and she is on 100 mg at night.  She is doing better in terms of tremor.  She has good days and bad days and she knows that stress makes it worse.  She did see [name] since last visit and I reviewed those notes.  She had a PSG.  She had an AHI of 60.6 but this was all in supine position and AHI in lateral position was 0.0 (but I was unsure of how much time, if any, was spent in the lateral position).  CPAP at 6 was recommended but she admits that she "hasn't given in to that" and wants to talk with Dr. [name].    01/29/15 update:  Pt returns today.  She had called and wanted to try to increase the primidone to 100 mg bid but it made her too sleepy.  She is therefore just on 2 at night.  She generally is doing  okay, but her husband is physically not well and that stress will increase tremor.  Husband with dementia and she is caregiving.  She is seeking out advice from psychologist.  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  The records that were made available to me were reviewed.  Less sleepy during the day with CPAP.    08/01/15 update:  The patient returns today for follow-up.  I have reviewed records since her last visit.  She has a history of essential tremor.  She is currently on primidone, 100 mg at night.  Daytime dosages have caused sleepiness.  She did follow-up with [name] on 04/14/2015 in regards to her sleep apnea.  She has been very compliant with this.  She reports that she is doing worse.  She states that she can hardly get food in the mouth at times.  She has no asthma.  She states that she has tried to relieve stress (primary caregiver for her husband who is ill) through painting and when she tried to sign the painting today, she couldn't do it.  She has never been on a beta blocker.  She does state  that she has been ill recently and is on an aquatic's, but she is not on any prednisone.  11/04/15 update:  The patient is following up today regarding her tremor.  I have reviewed records since our last visit.  She is on primidone, 100 mg at night.  Last visit, I cautiously started her on Inderal LA, 80 mg daily.  I told her to call me with any side effects or problems.  I have not heard from her, but it appears that she has been in the emergency room multiple times.  She went to the emergency room on 08/11/2015 after a fall on her arm.  She states that with this one she misstepped.  A CT of the brain was performed and was negative.   A few days later she twisted wrong in the kitchen and fell.   On 08/24/2015 she went back to the emergency room with another fall.  She fell in the kitchen and hit her head on the dishwasher.  Her scalp wound was repaired with sutures and staples and she was advised to discontinue  her Inderal.  Of note was that her pulse was 66 in the emergency room and blood pressure was 152/72.  She followed up with her primary care physician and had an MRI of the brain on 09/07/2015.  I had the opportunity to review this.  It was nonacute.  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  Since those falls, she has gone to PT and gone to the gym.  She loved the PT but she states that sx's are so erratic that she may not know it was going to occur.  She went back to the emergency room on 08/28/2015 with complaints of knee pain, after her right leg gave out while walking in a restaurant.  She had one other fall where she tripped over a throw rug at a friends house.  She attributes the last 2 falls to back pain.    She saw [name] on 10/02/2015 with excessive daytime hypersomnolence.  [name] interrogated her machine and she was very compliant with her CPAP and there was no significant mask leak.  [name] suggested perhaps decreasing her Inderal and also started her on Provigil, 100 mg daily.  The patient didn't try the provigil yet as she was worried about SE that [name] talked to her about.  Separate from all of this, she c/o sweats.  She is unsure of how long she has been on cymbalta or how long the sweats have been.  Her pharmacist suggested that the sweats may have been from propranolol.  02/04/16 update:  The patient follows up today for essential tremor.  She remains on primidone, 100 mg at night.  Daytime dosages have made her sleepy, so she is only on nighttime dosages.  Last visit, I changed her from Inderal LA, 80 mg to propranolol 20 mg twice a day, primarily because she started falling after I added the Inderal LA.  She called me not long after I did that to let me know that while she was no longer falling, she could not tolerate the tremor and wanted to go back up on the medication.  I was very leery about this, but  we ultimately told her she can go up to 20 mg 3 times a day.  We called her shortly thereafter and she stated that she was only doing 3 times a  day as needed.  She states today, however, that she is only taking 20 mg at night unless she has a "lunch date" and then she may take an extra during the day.   Last visit, she was also complaining about significant diaphoresis and I told her that it could be the Cymbalta and told her that perhaps she could discuss this with her prescribing physician.  She actually thinks that lowering the propranolol helped the sweating but she is still sweating (but states that weather is bit cooler).  Unfortunately, despite the change in medication, she has had a few more falls.  The last fall was a week ago.  She had her cane and was coming out of the store and she "stubbed" her foot and fell.  No dizziness associated with the falls.  She is in her 2nd session of PT in [place].    09/02/16 update:  Pt f/u today.  This patient is accompanied in the office by her daughter who supplements the history.  The records that were made available to me were reviewed since last visit.  On primidone - 100 mg q hs but she d/c her propranolol since last visit.  Last visit, we discussed EMG for peripheral neuropathy and the patient called back the day after the last visit and wanted to schedule that but she ultimately cancelled the appt.  She fell in March and the only other fall was on the grass a few weeks ago at church as she was on uneven terrain.  She continues to caregive and is stressed but is seeing her counselor.  Reports that in April she had a DVT and is now on eloquis.  Not long after starting on eloquis, she noted a sharp shooting pain in the L face and L eye.  States that she saw PCP and did not have suspicion for temporal arteritis.  It generally has gotten better and is not coming often now.  She cannot remember the last time that she had that.  The patient had an MRI of the brain on  08/31/16 that I had the opportunity to review.  There was significant/severe white matter disease but it had not changed since 2016.  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  I reviewed this with pt/daughter.  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  She states that she thinks that last time she had angiogram was at her AVM coiling in the 1990's at [facility].    Still worries about her memory but does think that stress is affecting that.  Husband with dementia.  Looking into moving into assisted living.  12/14/16 update:  Patient f/u today.  Last visit, I did have to stop her primidone, just while she was on Eliquis.  She is now off of the Eliquis and restarted her primidone.  "Boy am I glad to be back on that."  She is back on 100 mg at night of the primidone.  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  She was very worried about memory  change last visit and subsequently underwent neuropsych testing on 09/22/2016.  She has seen Dr. Si Raider for follow-up and results.  As suspected, there was no evidence of dementia.  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  States that she was so glad that she did that as she just needed confirmation that she did not have dementia.  States that she has colitis and was told that it was from anxiety.  Had diarrhea x 4-5 weeks.  Getting ready to move to [facility] at independent assisted living and that has been stressful.  Husbands health declining  both physically and mentally.  Pt is seeing counselor, Jackelyn Hoehn at [facility].  When asked if lexapro was helping, she states that her psychologist recommended a change but she thinks that changing previously didn't help so she is Chiropodist.  She does think that the lexapro is helping.  She was previously on effexor.  Wellbutrin caused SE.  Been on cymbalta in the past but caused sweats.    04/13/17 update:  Pt seen in f/u today.  On primidone 100 mg q hs and stable in regards to tremor.  In regards to falls states that she is doing well.  Moved to [place] and moved to independent living. Was a very stressful move for her and caused anxiety/depression for her.  Feels that she is adjusting better.  Still seeing Jackelyn Hoehn and he suggested he suggested talking to Dr. [name] and Trintellix was started.  It helped.  It did cause itching but patient states it is intermittent and she is trying to tolerate that.   Developed C. Diff in Jan (complained last visit of diarrhea but hadn't yet been diagnosed).     ALLERGIES:   Allergies  Allergen Reactions  . Buprenorphine Hcl Nausea And Vomiting  . Codeine Nausea And Vomiting  . Morphine And Related Nausea And Vomiting    CURRENT MEDICATIONS:   Current Outpatient Prescriptions:  .  Cholecalciferol (VITAMIN D-3) 1000 UNITS CAPS, Take 1,000 Units by mouth daily. , Disp: , Rfl:  .  dicyclomine (BENTYL) 10 MG capsule, Take one tablet three times daily as needed for loose stool, cramping, and abdominal pain, Disp: 90 capsule, Rfl: 1 .  esomeprazole (NEXIUM) 40 MG capsule, , Disp: , Rfl:  .  mirabegron ER (MYRBETRIQ) 50 MG TB24 tablet, Take 50 mg by mouth daily. , Disp: , Rfl:  .  primidone (MYSOLINE) 50 MG tablet, Take 2 tablets (100 mg total) by mouth at bedtime., Disp: 180 tablet, Rfl: 1 .  solifenacin (VESICARE) 10 MG tablet, Take by mouth daily., Disp: , Rfl:  .  traMADol (ULTRAM) 50 MG tablet, Take 1 tablet (50 mg total) by mouth every 6 (six) hours  as needed for severe pain., Disp: 15 tablet, Rfl: 0 .  trimethoprim (TRIMPEX) 100 MG tablet, Take 100 mg by mouth daily. , Disp: , Rfl:  .  valsartan (DIOVAN) 160 MG tablet, Take 160 mg by mouth daily. , Disp: , Rfl:  .  vancomycin (VANCOCIN) 250 MG capsule, Take 2 capsules by mouth every 6 (six) hours., Disp: , Rfl:  .  Vortioxetine HBr (TRINTELLIX PO), Take by mouth daily., Disp: , Rfl:    PAST MEDICAL HISTORY:   Past Medical History:  Diagnosis Date  . 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 Benign familial tremor   . Clostridium difficile infection   . DDD (degenerative disc disease), lumbar   . Diverticulosis   .  DVT (deep venous thrombosis) (HCC)    left leg  . Gallstones   . GERD (gastroesophageal reflux disease)   . Hypertension   . Osteoporosis   . Wears glasses     PAST SURGICAL HISTORY:   Past Surgical History:  Procedure Laterality Date  . ABDOMINAL HYSTERECTOMY  1983  . arterial vascular fistula  2004   coiled at [place]  [phone]  . CARPAL TUNNEL RELEASE  right  . CHOLECYSTECTOMY N/A 03/29/2013   Procedure: LAPAROSCOPIC CHOLECYSTECTOMY WITH INTRAOPERATIVE CHOLANGIOGRAM;  Surgeon: Haywood Lasso, MD;  Location: [facility];  Service: General;  Laterality: N/A;  . EUS N/A 03/09/2013   Procedure: UPPER ENDOSCOPIC ULTRASOUND (EUS) LINEAR;  Surgeon: Beryle Beams, MD;  Location: [facility];  Service: Endoscopy;  Laterality: N/A;  . LUMBAR LAMINECTOMY/DECOMPRESSION MICRODISCECTOMY  11/30/2012   Procedure: LUMBAR LAMINECTOMY/DECOMPRESSION MICRODISCECTOMY 2 LEVELS;  Surgeon: Magnus Sinning, MD;  Location: [facility];  Service: Orthopedics;  Laterality: N/A;  DECOMPRESSION LAMINECTOMY L4-L5, L5-S1 CENTRAL   . ROTATOR CUFF REPAIR Bilateral 2004   two on right, one on left  . TRIGGER FINGER RELEASE Right     hand    SOCIAL HISTORY:   Social History   Social History  . Marital status: Married    Spouse name: Richard  .  Number of children: 2  . Years of education: 15   Occupational History  . interior designer     partially retired   Social History Main Topics  . Smoking status: Former Smoker    Packs/day: 0.50    Types: Cigarettes    Quit date: 07/11/1970  . Smokeless tobacco: Never Used  . Alcohol use 0.0 oz/week     Comment: wine occasionally  . Drug use: No  . Sexual activity: Not Currently   Other Topics Concern  . Not on file   Social History Narrative   Patient is right handed, resides with spouse in a home.   1 cup of coffee a day     FAMILY HISTORY:   Family Status  Relation Status  . Mother Deceased at age 30   sarcoma  . Father Deceased   complications from broken hip, ET  . Sister Deceased   blood clot, ET  . Son Alive   healthy, [place]  . Daughter Alive   healthy    ROS:  A complete 10 system review of systems was obtained and was unremarkable apart from what is mentioned above.  PHYSICAL EXAMINATION:    VITALS:   Vitals:   04/13/17 1100  BP: 120/72  Pulse: 78  SpO2: 94%  Weight: 207 lb (93.9 kg)  Height: 5' (1.524 m)    GEN:  The patient appears stated age and is in NAD. HEENT:  Normocephalic, atraumatic.  The mucous membranes are moist. The superficial temporal arteries are without ropiness or tenderness. CV:  RRR Lungs:  CTAB Neck/HEME:  There are no carotid bruits bilaterally.  Neurological examination:  Orientation: The patient is alert and oriented x3. Tearful/emotional today Cranial nerves: There is good facial symmetry.  Extraocular muscles are intact. The visual fields are full to confrontational testing. The speech is fluent and clear. Soft palate rises symmetrically and there is no tongue deviation. Hearing is intact to conversational tone. Sensation: Sensation is intact to light touch.   Vibration is decreased at the bilateral big toe. There is no extinction with double simultaneous stimulation. There is no sensory dermatomal level  identified. Motor:  Strength is 5/5 in the bilateral upper and lower extremities.   Shoulder shrug is equal and symmetric.  There is no pronator drift.   Movement examination: Tone: There is normal tone in the bilateral upper extremities.  The tone in the lower extremities is normal.  Abnormal movements: There is mild bilateral independent resting tremor.  There is tremor of the outstretched hands that increases with intention bilaterally. Tremor becomes mod with holding a weight in hand.  Some trouble with archimedes spirals.   Coordination:  There is no significant decremation with RAM's, with any form of rapid alternating movement including alternating supination and pronation of the forearm, hand opening and closing, finger taps, heel taps and toe taps. Gait and Station: The patient has a wide based gait and is antalgic.  She has her walker today  ASSESSMENT/PLAN:  1.  Essential tremor.  - She had difficulty with daytime hypersomnolence with daytime dosages of primidone.  We will try again now that she is on it longer.  Increase to 25/100 for a week and then 50/100 thereafter and see if able to tolerate better.  She will let me know if she has problems.    -Initially, we thought that her falls were related to Inderal LA, 80 mg daily but ultimately decided that they were not.  She has taken herself off of it for now and will leave off.   2.  Falls  -fall frequency markedly improved.  Using walker at all times per pt  -has evidence of peripheral neuropathy.  Cancelled her EMG in the past. 3.  Hx AVM repair with coiling  -She does have an upgoing toe on the right, which I think is likely just a result of her history of AVM, which she reports was on the left.  I see nothing else focal or lateralizing on her examination today, so decided to hold off on any further neuroimaging.    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 4.  Trigeminal neuralgia  -L face pain sounds c/w TN.  Pt doing better and wants no med.  Doing better in this regard 4.  OSAS  -On CPAP faithfully and doing well.  Still has EDS and seeing [name].  [name] gave her provigil but pt hasnt' tried it yet.  Pt wishes to transition CPAP care here and that is okay. 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  -Admits that she wasn't doing well with move to [place] but started on trintellix and some better but does have itching on trintellix that she is trying to "work through."  Still seeing Jackelyn Hoehn.  -She had neuropsych testing on 09/22/2016.   As suspected, there was no evidence of dementia.  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  -encouraged her to get involved with activities at [place] 6.  Diaphoresis  -Improved off of cymbalta and wellbutrin 7.  Much greater than 50% of this visit was spent in counseling and coordinating care.  Total face to face time:  35 min

## 2017-04-13 ENCOUNTER — Encounter: Payer: Self-pay | Admitting: Neurology

## 2017-04-13 ENCOUNTER — Ambulatory Visit (INDEPENDENT_AMBULATORY_CARE_PROVIDER_SITE_OTHER): Payer: Medicare Other | Admitting: Neurology

## 2017-04-13 VITALS — BP 120/72 | HR 78 | Ht 60.0 in | Wt 207.0 lb

## 2017-04-13 DIAGNOSIS — F32A Depression, unspecified: Secondary | ICD-10-CM

## 2017-04-13 DIAGNOSIS — F329 Major depressive disorder, single episode, unspecified: Secondary | ICD-10-CM | POA: Diagnosis not present

## 2017-04-13 DIAGNOSIS — G250 Essential tremor: Secondary | ICD-10-CM | POA: Diagnosis not present

## 2017-04-13 MED ORDER — PRIMIDONE 50 MG PO TABS: ORAL_TABLET | ORAL | 1 refills | Status: DC

## 2017-04-13 NOTE — Patient Instructions (Signed)
Increase primidone to 1/2 tablet in the morning, 2 tablets at night for a week and 1 tablet in the morning, 2 tablets at night after. Please let us know if you have excessive sleepiness.

## 2017-04-19 DIAGNOSIS — M4316 Spondylolisthesis, lumbar region: Secondary | ICD-10-CM | POA: Diagnosis not present

## 2017-04-19 DIAGNOSIS — M545 Low back pain: Secondary | ICD-10-CM | POA: Diagnosis not present

## 2017-04-19 DIAGNOSIS — R262 Difficulty in walking, not elsewhere classified: Secondary | ICD-10-CM | POA: Diagnosis not present

## 2017-04-19 DIAGNOSIS — M48062 Spinal stenosis, lumbar region with neurogenic claudication: Secondary | ICD-10-CM | POA: Diagnosis not present

## 2017-04-21 DIAGNOSIS — Z961 Presence of intraocular lens: Secondary | ICD-10-CM | POA: Diagnosis not present

## 2017-04-21 DIAGNOSIS — H3500 Unspecified background retinopathy: Secondary | ICD-10-CM | POA: Diagnosis not present

## 2017-04-21 DIAGNOSIS — H524 Presbyopia: Secondary | ICD-10-CM | POA: Diagnosis not present

## 2017-04-21 DIAGNOSIS — H52203 Unspecified astigmatism, bilateral: Secondary | ICD-10-CM | POA: Diagnosis not present

## 2017-04-28 DIAGNOSIS — I10 Essential (primary) hypertension: Secondary | ICD-10-CM | POA: Diagnosis not present

## 2017-04-28 DIAGNOSIS — M859 Disorder of bone density and structure, unspecified: Secondary | ICD-10-CM | POA: Diagnosis not present

## 2017-04-28 DIAGNOSIS — R7301 Impaired fasting glucose: Secondary | ICD-10-CM | POA: Diagnosis not present

## 2017-05-05 DIAGNOSIS — M859 Disorder of bone density and structure, unspecified: Secondary | ICD-10-CM | POA: Diagnosis not present

## 2017-05-05 DIAGNOSIS — Z6841 Body Mass Index (BMI) 40.0 and over, adult: Secondary | ICD-10-CM | POA: Diagnosis not present

## 2017-05-05 DIAGNOSIS — N390 Urinary tract infection, site not specified: Secondary | ICD-10-CM | POA: Diagnosis not present

## 2017-05-05 DIAGNOSIS — I10 Essential (primary) hypertension: Secondary | ICD-10-CM | POA: Diagnosis not present

## 2017-05-05 DIAGNOSIS — Z0000 Encounter for general adult medical examination without abnormal findings: Secondary | ICD-10-CM | POA: Diagnosis not present

## 2017-05-05 DIAGNOSIS — A0472 Enterocolitis due to Clostridium difficile, not specified as recurrent: Secondary | ICD-10-CM | POA: Diagnosis not present

## 2017-05-05 DIAGNOSIS — F3289 Other specified depressive episodes: Secondary | ICD-10-CM | POA: Diagnosis not present

## 2017-05-05 DIAGNOSIS — Z1389 Encounter for screening for other disorder: Secondary | ICD-10-CM | POA: Diagnosis not present

## 2017-05-05 DIAGNOSIS — Z86718 Personal history of other venous thrombosis and embolism: Secondary | ICD-10-CM | POA: Diagnosis not present

## 2017-05-05 DIAGNOSIS — G4733 Obstructive sleep apnea (adult) (pediatric): Secondary | ICD-10-CM | POA: Diagnosis not present

## 2017-05-05 DIAGNOSIS — R7301 Impaired fasting glucose: Secondary | ICD-10-CM | POA: Diagnosis not present

## 2017-05-05 DIAGNOSIS — N3281 Overactive bladder: Secondary | ICD-10-CM | POA: Diagnosis not present

## 2017-05-17 ENCOUNTER — Ambulatory Visit: Payer: Medicare Other | Admitting: Internal Medicine

## 2017-05-24 DIAGNOSIS — M859 Disorder of bone density and structure, unspecified: Secondary | ICD-10-CM | POA: Diagnosis not present

## 2017-05-30 DIAGNOSIS — M48062 Spinal stenosis, lumbar region with neurogenic claudication: Secondary | ICD-10-CM | POA: Diagnosis not present

## 2017-05-30 DIAGNOSIS — M4696 Unspecified inflammatory spondylopathy, lumbar region: Secondary | ICD-10-CM | POA: Diagnosis not present

## 2017-06-30 DIAGNOSIS — M47816 Spondylosis without myelopathy or radiculopathy, lumbar region: Secondary | ICD-10-CM | POA: Diagnosis not present

## 2017-07-15 ENCOUNTER — Telehealth: Payer: Self-pay | Admitting: Internal Medicine

## 2017-07-15 DIAGNOSIS — R197 Diarrhea, unspecified: Secondary | ICD-10-CM

## 2017-07-15 NOTE — Telephone Encounter (Signed)
Pyrtle pt with h/o recurrent cdiff. Was set up for appt with Dr. Baxter Flattery for consideration of fecal transplant and she cancelled the appointment, states she was confused about who she was supposed to see. Pt calling this am reporting that she is having the same symptoms again with explosive loose stools and she thinks she may have cdiff again. Dr. Ardis Hughs as DOD please advise.

## 2017-07-15 NOTE — Telephone Encounter (Signed)
Spoke with Shirley Bogus PA and pt needs to come in and give a stool specimen for Cdiff. Pt aware, orders in epic.

## 2017-07-18 ENCOUNTER — Other Ambulatory Visit: Payer: Medicare Other

## 2017-07-18 DIAGNOSIS — R197 Diarrhea, unspecified: Secondary | ICD-10-CM

## 2017-07-19 LAB — CLOSTRIDIUM DIFFICILE BY PCR: Toxigenic C. Difficile by PCR: NOT DETECTED

## 2017-07-26 ENCOUNTER — Telehealth: Payer: Self-pay

## 2017-07-26 NOTE — Telephone Encounter (Signed)
Spoke with pt and her cdiff test was negative. States she is feeling some better.

## 2017-07-28 DIAGNOSIS — N3946 Mixed incontinence: Secondary | ICD-10-CM | POA: Diagnosis not present

## 2017-07-28 DIAGNOSIS — N3020 Other chronic cystitis without hematuria: Secondary | ICD-10-CM | POA: Diagnosis not present

## 2017-08-01 DIAGNOSIS — M47816 Spondylosis without myelopathy or radiculopathy, lumbar region: Secondary | ICD-10-CM | POA: Diagnosis not present

## 2017-08-05 DIAGNOSIS — M545 Low back pain: Secondary | ICD-10-CM | POA: Diagnosis not present

## 2017-08-05 DIAGNOSIS — M47816 Spondylosis without myelopathy or radiculopathy, lumbar region: Secondary | ICD-10-CM | POA: Diagnosis not present

## 2017-08-08 ENCOUNTER — Other Ambulatory Visit: Payer: Self-pay | Admitting: Neurology

## 2017-08-09 DIAGNOSIS — R350 Frequency of micturition: Secondary | ICD-10-CM | POA: Diagnosis not present

## 2017-08-09 DIAGNOSIS — N3946 Mixed incontinence: Secondary | ICD-10-CM | POA: Diagnosis not present

## 2017-08-11 DIAGNOSIS — M47816 Spondylosis without myelopathy or radiculopathy, lumbar region: Secondary | ICD-10-CM | POA: Diagnosis not present

## 2017-08-11 DIAGNOSIS — M545 Low back pain: Secondary | ICD-10-CM | POA: Diagnosis not present

## 2017-08-17 DIAGNOSIS — M47816 Spondylosis without myelopathy or radiculopathy, lumbar region: Secondary | ICD-10-CM | POA: Diagnosis not present

## 2017-08-17 DIAGNOSIS — M545 Low back pain: Secondary | ICD-10-CM | POA: Diagnosis not present

## 2017-08-18 DIAGNOSIS — R350 Frequency of micturition: Secondary | ICD-10-CM | POA: Diagnosis not present

## 2017-08-19 DIAGNOSIS — D2261 Melanocytic nevi of right upper limb, including shoulder: Secondary | ICD-10-CM | POA: Diagnosis not present

## 2017-08-19 DIAGNOSIS — D225 Melanocytic nevi of trunk: Secondary | ICD-10-CM | POA: Diagnosis not present

## 2017-08-19 DIAGNOSIS — D692 Other nonthrombocytopenic purpura: Secondary | ICD-10-CM | POA: Diagnosis not present

## 2017-08-19 DIAGNOSIS — Z85828 Personal history of other malignant neoplasm of skin: Secondary | ICD-10-CM | POA: Diagnosis not present

## 2017-08-19 DIAGNOSIS — L853 Xerosis cutis: Secondary | ICD-10-CM | POA: Diagnosis not present

## 2017-08-19 DIAGNOSIS — L821 Other seborrheic keratosis: Secondary | ICD-10-CM | POA: Diagnosis not present

## 2017-08-23 DIAGNOSIS — R350 Frequency of micturition: Secondary | ICD-10-CM | POA: Diagnosis not present

## 2017-08-24 DIAGNOSIS — M545 Low back pain: Secondary | ICD-10-CM | POA: Diagnosis not present

## 2017-08-24 DIAGNOSIS — M47816 Spondylosis without myelopathy or radiculopathy, lumbar region: Secondary | ICD-10-CM | POA: Diagnosis not present

## 2017-08-25 DIAGNOSIS — M47816 Spondylosis without myelopathy or radiculopathy, lumbar region: Secondary | ICD-10-CM | POA: Diagnosis not present

## 2017-08-25 DIAGNOSIS — M545 Low back pain: Secondary | ICD-10-CM | POA: Diagnosis not present

## 2017-08-31 DIAGNOSIS — M47816 Spondylosis without myelopathy or radiculopathy, lumbar region: Secondary | ICD-10-CM | POA: Diagnosis not present

## 2017-08-31 DIAGNOSIS — M545 Low back pain: Secondary | ICD-10-CM | POA: Diagnosis not present

## 2017-09-01 NOTE — Progress Notes (Signed)
[name] was seen today in the movement disorders clinic for neurologic consultation at the request of [name], MD.  The consultation is for the evaluation of tremor.  The records that were made available to me were reviewed.  Pt is R hand dominant.  Pt reports tremor for 20-30 years, but was mild when it started.  It started in both hands.  Her father had the same thing, as did her sister and her son now has the same thing.  It didn't really start to bother her until 5-6 years ago.  She started on primidone at that time, 50 mg and it helped.  It was increased to 100 mg and it helped as well.  No SE with the medication.  Over the last few years, tremor has continued to pick up, and she is noticing it in the legs now as well.  She notices that anxiety/stress will increase the tremor.   In January, the patient ran out of her primidone for 4-5 days and she really noted a deterioration in the tremor.  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  She does not necessarily doze unexpectedly, but is very sleepy during the day.  She has an appointment with [name] to discuss this.   07/29/14 update:  Last visit, I increased the patients primidone to 50 mg in the AM and she is on 100 mg at night.  She is doing better in terms of tremor.  She has good days and bad days and she knows that stress makes it worse.  She did see [name] since last visit and I reviewed those notes.  She had a PSG.  She had an AHI of 60.6 but this was all in supine position and AHI in lateral position was 0.0 (but I was unsure of how much time, if any, was spent in the lateral position).  CPAP at 6 was recommended but she admits that she "hasn't given in to that" and wants to talk with Dr. [name].    01/29/15 update:  Pt returns today.  She had called and wanted to try to increase the primidone to 100 mg bid but it made her too sleepy.  She is therefore just on 2 at night.  She generally is doing  okay, but her husband is physically not well and that stress will increase tremor.  Husband with dementia and she is caregiving.  She is seeking out advice from psychologist.  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  The records that were made available to me were reviewed.  Less sleepy during the day with CPAP.    08/01/15 update:  The patient returns today for follow-up.  I have reviewed records since her last visit.  She has a history of essential tremor.  She is currently on primidone, 100 mg at night.  Daytime dosages have caused sleepiness.  She did follow-up with [name] on 04/14/2015 in regards to her sleep apnea.  She has been very compliant with this.  She reports that she is doing worse.  She states that she can hardly get food in the mouth at times.  She has no asthma.  She states that she has tried to relieve stress (primary caregiver for her husband who is ill) through painting and when she tried to sign the painting today, she couldn't do it.  She has never been on a beta blocker.  She does state  that she has been ill recently and is on an aquatic's, but she is not on any prednisone.  11/04/15 update:  The patient is following up today regarding her tremor.  I have reviewed records since our last visit.  She is on primidone, 100 mg at night.  Last visit, I cautiously started her on Inderal LA, 80 mg daily.  I told her to call me with any side effects or problems.  I have not heard from her, but it appears that she has been in the emergency room multiple times.  She went to the emergency room on 08/11/2015 after a fall on her arm.  She states that with this one she misstepped.  A CT of the brain was performed and was negative.   A few days later she twisted wrong in the kitchen and fell.   On 08/24/2015 she went back to the emergency room with another fall.  She fell in the kitchen and hit her head on the dishwasher.  Her scalp wound was repaired with sutures and staples and she was advised to discontinue  her Inderal.  Of note was that her pulse was 66 in the emergency room and blood pressure was 152/72.  She followed up with her primary care physician and had an MRI of the brain on 09/07/2015.  I had the opportunity to review this.  It was nonacute.  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  Since those falls, she has gone to PT and gone to the gym.  She loved the PT but she states that sx's are so erratic that she may not know it was going to occur.  She went back to the emergency room on 08/28/2015 with complaints of knee pain, after her right leg gave out while walking in a restaurant.  She had one other fall where she tripped over a throw rug at a friends house.  She attributes the last 2 falls to back pain.    She saw [name] on 10/02/2015 with excessive daytime hypersomnolence.  [name] interrogated her machine and she was very compliant with her CPAP and there was no significant mask leak.  [name] suggested perhaps decreasing her Inderal and also started her on Provigil, 100 mg daily.  The patient didn't try the provigil yet as she was worried about SE that [name] talked to her about.  Separate from all of this, she c/o sweats.  She is unsure of how long she has been on cymbalta or how long the sweats have been.  Her pharmacist suggested that the sweats may have been from propranolol.  02/04/16 update:  The patient follows up today for essential tremor.  She remains on primidone, 100 mg at night.  Daytime dosages have made her sleepy, so she is only on nighttime dosages.  Last visit, I changed her from Inderal LA, 80 mg to propranolol 20 mg twice a day, primarily because she started falling after I added the Inderal LA.  She called me not long after I did that to let me know that while she was no longer falling, she could not tolerate the tremor and wanted to go back up on the medication.  I was very leery about this, but  we ultimately told her she can go up to 20 mg 3 times a day.  We called her shortly thereafter and she stated that she was only doing 3 times a  day as needed.  She states today, however, that she is only taking 20 mg at night unless she has a "lunch date" and then she may take an extra during the day.   Last visit, she was also complaining about significant diaphoresis and I told her that it could be the Cymbalta and told her that perhaps she could discuss this with her prescribing physician.  She actually thinks that lowering the propranolol helped the sweating but she is still sweating (but states that weather is bit cooler).  Unfortunately, despite the change in medication, she has had a few more falls.  The last fall was a week ago.  She had her cane and was coming out of the store and she "stubbed" her foot and fell.  No dizziness associated with the falls.  She is in her 2nd session of PT in [place].    09/02/16 update:  Pt f/u today.  This patient is accompanied in the office by her daughter who supplements the history.  The records that were made available to me were reviewed since last visit.  On primidone - 100 mg q hs but she d/c her propranolol since last visit.  Last visit, we discussed EMG for peripheral neuropathy and the patient called back the day after the last visit and wanted to schedule that but she ultimately cancelled the appt.  She fell in March and the only other fall was on the grass a few weeks ago at church as she was on uneven terrain.  She continues to caregive and is stressed but is seeing her counselor.  Reports that in April she had a DVT and is now on eloquis.  Not long after starting on eloquis, she noted a sharp shooting pain in the L face and L eye.  States that she saw PCP and did not have suspicion for temporal arteritis.  It generally has gotten better and is not coming often now.  She cannot remember the last time that she had that.  The patient had an MRI of the brain on  08/31/16 that I had the opportunity to review.  There was significant/severe white matter disease but it had not changed since 2016.  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  I reviewed this with pt/daughter.  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  She states that she thinks that last time she had angiogram was at her AVM coiling in the 1990's at [facility].    Still worries about her memory but does think that stress is affecting that.  Husband with dementia.  Looking into moving into assisted living.  12/14/16 update:  Patient f/u today.  Last visit, I did have to stop her primidone, just while she was on Eliquis.  She is now off of the Eliquis and restarted her primidone.  "Boy am I glad to be back on that."  She is back on 100 mg at night of the primidone.  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  She was very worried about memory  change last visit and subsequently underwent neuropsych testing on 09/22/2016.  She has seen Dr. Si Raider for follow-up and results.  As suspected, there was no evidence of dementia.  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  States that she was so glad that she did that as she just needed confirmation that she did not have dementia.  States that she has colitis and was told that it was from anxiety.  Had diarrhea x 4-5 weeks.  Getting ready to move to [facility] at independent assisted living and that has been stressful.  Husbands health declining  both physically and mentally.  Pt is seeing counselor, Jackelyn Hoehn at [facility].  When asked if lexapro was helping, she states that her psychologist recommended a change but she thinks that changing previously didn't help so she is Chiropodist.  She does think that the lexapro is helping.  She was previously on effexor.  Wellbutrin caused SE.  Been on cymbalta in the past but caused sweats.    04/13/17 update:  Pt seen in f/u today.  On primidone 100 mg q hs and stable in regards to tremor.  In regards to falls states that she is doing well.  Moved to [place] and moved to independent living. Was a very stressful move for her and caused anxiety/depression for her.  Feels that she is adjusting better.  Still seeing Jackelyn Hoehn and he suggested he suggested talking to Dr. [name] and Trintellix was started.  It helped.  It did cause itching but patient states it is intermittent and she is trying to tolerate that.   Developed C. Diff in Jan (complained last visit of diarrhea but hadn't yet been diagnosed).    09/02/17 update:  Patient seen today in follow-up for essential tremor.  The records that were made available to me were reviewed.    I increased her primidone last visit cautiously and slowly because she had had hypersomnolence with that in the past.  My goal is to get her up to 50 mg in the morning and 100 mg at night.  She states that this just made her too sleepy and she ended up going back to 2 tablets at night.  She is using her walker at all times.  She is still seeing Jackelyn Hoehn for counseling for depression.  States that she is still adjusting to moving to [place] but she is doing somewhat better.  States that she has had a lot of anxiety.  Lots going on in personal life and finds herself worried about that.  Memory isn't as good.  She is faithful with CPAP but still has EDS.  Did fall with her husband at [employer].  They made each other fall.  She fell in the living room last week because she  tripped over her husbands shoes.     ALLERGIES:   Allergies  Allergen Reactions  . Buprenorphine Hcl Nausea And Vomiting  . Codeine Nausea And Vomiting  . Morphine And Related Nausea And Vomiting    CURRENT MEDICATIONS:   Current Outpatient Prescriptions:  .  esomeprazole (NEXIUM) 40 MG capsule, , Disp: , Rfl:  .  mirabegron ER (MYRBETRIQ) 50 MG TB24 tablet, Take 50 mg by mouth daily. , Disp: , Rfl:  .  olmesartan (BENICAR) 20 MG tablet, Take 20 mg by mouth daily., Disp: , Rfl:  .  primidone (MYSOLINE) 50 MG tablet, Take 3 tablets (150 mg total) by mouth at bedtime., Disp: 270 tablet, Rfl:  1 .  solifenacin (VESICARE) 10 MG tablet, Take by mouth daily., Disp: , Rfl:  .  trimethoprim (TRIMPEX) 100 MG tablet, Take 100 mg by mouth daily. , Disp: , Rfl:    PAST MEDICAL HISTORY:   Past Medical History:  Diagnosis Date  . 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 Benign familial tremor   . Clostridium difficile infection   . DDD (degenerative disc disease), lumbar   . Diverticulosis   . DVT (deep venous thrombosis) (HCC)    left leg  . Gallstones   . GERD (gastroesophageal reflux disease)   . Hypertension   . Osteoporosis   . Wears glasses     PAST SURGICAL HISTORY:   Past Surgical History:  Procedure Laterality Date  . ABDOMINAL HYSTERECTOMY  1983  . arterial vascular fistula  2004   coiled at [facility]  [phone]  . CARPAL TUNNEL RELEASE  right  . CHOLECYSTECTOMY N/A 03/29/2013   Procedure: LAPAROSCOPIC CHOLECYSTECTOMY WITH INTRAOPERATIVE CHOLANGIOGRAM;  Surgeon: Haywood Lasso, MD;  Location: [facility];  Service: General;  Laterality: N/A;  . EUS N/A 03/09/2013   Procedure: UPPER ENDOSCOPIC ULTRASOUND (EUS) LINEAR;  Surgeon: Beryle Beams, MD;  Location: [facility];  Service: Endoscopy;  Laterality: N/A;  . LUMBAR LAMINECTOMY/DECOMPRESSION MICRODISCECTOMY  11/30/2012   Procedure: LUMBAR LAMINECTOMY/DECOMPRESSION MICRODISCECTOMY 2  LEVELS;  Surgeon: Magnus Sinning, MD;  Location: [facility];  Service: Orthopedics;  Laterality: N/A;  DECOMPRESSION LAMINECTOMY L4-L5, L5-S1 CENTRAL   . ROTATOR CUFF REPAIR Bilateral 2004   two on right, one on left  . TRIGGER FINGER RELEASE Right     hand    SOCIAL HISTORY:   Social History   Social History  . Marital status: Married    Spouse name: Richard  . Number of children: 2  . Years of education: 72   Occupational History  . interior designer     partially retired   Social History Main Topics  . Smoking status: Former Smoker    Packs/day: 0.50    Types: Cigarettes    Quit date: 07/11/1970  . Smokeless tobacco: Never Used  . Alcohol use 0.0 oz/week     Comment: wine occasionally  . Drug use: No  . Sexual activity: Not Currently   Other Topics Concern  . Not on file   Social History Narrative   Patient is right handed, resides with spouse in a home.   1 cup of coffee a day     FAMILY HISTORY:   Family Status  Relation Status  . Mother Deceased at age 31       sarcoma  . Father Deceased       complications from broken hip, ET  . Sister Deceased       blood clot, ET  . Son Alive       healthy, [place]  . Daughter Alive       healthy    ROS:  A complete 10 system review of systems was obtained and was unremarkable apart from what is mentioned above.  PHYSICAL EXAMINATION:    VITALS:   Vitals:   09/02/17 1110  BP: 118/70  Pulse: 78  SpO2: 93%  Weight: 216 lb (98 kg)  Height: 5' (1.524 m)    GEN:  The patient appears stated age and is in NAD.  Somewhat emotional at times HEENT:  Normocephalic, atraumatic.  The mucous membranes are moist. The superficial temporal arteries  are without ropiness or tenderness. CV:  RRR Lungs:  CTAB Neck/HEME:  There are no carotid bruits bilaterally.  Neurological examination:  Orientation: The patient is alert and oriented x3.  Cranial nerves: There is good facial symmetry.  Extraocular muscles are intact. The  visual fields are full to confrontational testing. The speech is fluent and clear. Soft palate rises symmetrically and there is no tongue deviation. Hearing is intact to conversational tone. Sensation: Sensation is intact to light touch.   Vibration is decreased at the bilateral big toe. There is no extinction with double simultaneous stimulation. There is no sensory dermatomal level identified. Motor: Strength is 5/5 in the bilateral upper and lower extremities.   Shoulder shrug is equal and symmetric.  There is no pronator drift.   Movement examination: Tone: There is normal tone in the bilateral upper extremities.  The tone in the lower extremities is normal.  Abnormal movements: There is mild bilateral independent resting tremor.  There is tremor of the outstretched hands that increases with intention bilaterally. Tremor becomes mod with holding a weight in hand.  Some trouble with archimedes spirals.  Able to drink from a glass okay. Coordination:  There is no significant decremation with RAM's, with any form of rapid alternating movement including alternating supination and pronation of the forearm, hand opening and closing, finger taps, heel taps and toe taps. Gait and Station: The patient has a wide based gait and slightly antalgic with the walker  ASSESSMENT/PLAN:  1.  Essential tremor.  - increase Primidone, 50 mg, 3 po q hs.  Risks, benefits, side effects and alternative therapies were discussed.  The opportunity to ask questions was given and they were answered to the best of my ability.  The patient expressed understanding and willingness to follow the outlined treatment protocols.  -Initially, we thought that her falls were related to Inderal LA, 80 mg daily but ultimately decided that they were not.  She has taken herself off of it for now and will leave off.   2.  Falls  -had more falls lately.  In PT but she feels that they aren't working on her balance.  Will transfer referral to  neurorehab center at her request  -gave information to [facility]  -has evidence of peripheral neuropathy.  Cancelled her EMG in the past. 3.  Hx AVM repair with coiling  -She does have an upgoing toe on the right, which I think is likely just a result of her history of AVM, which she reports was on the left.  I see nothing else focal or lateralizing on her examination today, so decided to hold off on any further neuroimaging.    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 4.  Trigeminal neuralgia  -L face pain sounds c/w TN.  Pt doing better and wants no med.  Doing better in this regard 4.  OSAS  -On CPAP faithfully but still with some EDS.  I think likely related to depression 5  Anxiety and depression  -still having trouble with adjusting to move to [place].  Long counseling session.  Talked about goal setting and getting out of the house.  Talked about regular daily schedule. Still seeing Jackelyn Hoehn.  -She had neuropsych testing on 09/22/2016.   As suspected, there was no evidence of dementia.  There  was evidence of an adjustment disorder with mixed anxiety and depression.  Talked about whether or not to repeat given c/o memory change but I still think that this is just increase in anxiety/depression.  Can reconsider in future. 6.  Much greater than 50% of this visit was spent in counseling and coordinating care.  Total face to face time:  45 min.  Follow up is anticipated in the next few months, sooner should new neurologic issues arise.

## 2017-09-02 ENCOUNTER — Encounter: Payer: Self-pay | Admitting: Neurology

## 2017-09-02 ENCOUNTER — Ambulatory Visit (INDEPENDENT_AMBULATORY_CARE_PROVIDER_SITE_OTHER): Payer: Medicare Other | Admitting: Neurology

## 2017-09-02 VITALS — BP 118/70 | HR 78 | Ht 60.0 in | Wt 216.0 lb

## 2017-09-02 DIAGNOSIS — F331 Major depressive disorder, recurrent, moderate: Secondary | ICD-10-CM | POA: Diagnosis not present

## 2017-09-02 DIAGNOSIS — G250 Essential tremor: Secondary | ICD-10-CM | POA: Diagnosis not present

## 2017-09-02 DIAGNOSIS — R296 Repeated falls: Secondary | ICD-10-CM | POA: Diagnosis not present

## 2017-09-02 MED ORDER — PRIMIDONE 50 MG PO TABS: 150.0000 mg | ORAL_TABLET | Freq: Every day | ORAL | 1 refills | Status: DC

## 2017-09-02 NOTE — Patient Instructions (Addendum)
Increase primidone to 50 mg - 3 tablets at night  Check on [facility]  We will send a referral to the neurorehab center

## 2017-09-05 DIAGNOSIS — M545 Low back pain: Secondary | ICD-10-CM | POA: Diagnosis not present

## 2017-09-05 DIAGNOSIS — M47816 Spondylosis without myelopathy or radiculopathy, lumbar region: Secondary | ICD-10-CM | POA: Diagnosis not present

## 2017-09-06 DIAGNOSIS — R350 Frequency of micturition: Secondary | ICD-10-CM | POA: Diagnosis not present

## 2017-09-12 DIAGNOSIS — M47816 Spondylosis without myelopathy or radiculopathy, lumbar region: Secondary | ICD-10-CM | POA: Diagnosis not present

## 2017-09-12 DIAGNOSIS — M545 Low back pain: Secondary | ICD-10-CM | POA: Diagnosis not present

## 2017-09-20 DIAGNOSIS — R350 Frequency of micturition: Secondary | ICD-10-CM | POA: Diagnosis not present

## 2017-09-29 DIAGNOSIS — M25551 Pain in right hip: Secondary | ICD-10-CM | POA: Diagnosis not present

## 2017-10-11 DIAGNOSIS — Z23 Encounter for immunization: Secondary | ICD-10-CM | POA: Diagnosis not present

## 2017-10-13 DIAGNOSIS — L659 Nonscarring hair loss, unspecified: Secondary | ICD-10-CM | POA: Diagnosis not present

## 2017-10-13 DIAGNOSIS — Z85828 Personal history of other malignant neoplasm of skin: Secondary | ICD-10-CM | POA: Diagnosis not present

## 2017-10-25 DIAGNOSIS — M25562 Pain in left knee: Secondary | ICD-10-CM | POA: Diagnosis not present

## 2017-10-25 DIAGNOSIS — M79641 Pain in right hand: Secondary | ICD-10-CM | POA: Diagnosis not present

## 2017-11-09 DIAGNOSIS — F329 Major depressive disorder, single episode, unspecified: Secondary | ICD-10-CM | POA: Diagnosis not present

## 2017-11-09 DIAGNOSIS — G4733 Obstructive sleep apnea (adult) (pediatric): Secondary | ICD-10-CM | POA: Diagnosis not present

## 2017-11-09 DIAGNOSIS — K219 Gastro-esophageal reflux disease without esophagitis: Secondary | ICD-10-CM | POA: Diagnosis not present

## 2017-11-09 DIAGNOSIS — R7301 Impaired fasting glucose: Secondary | ICD-10-CM | POA: Diagnosis not present

## 2017-11-09 DIAGNOSIS — I10 Essential (primary) hypertension: Secondary | ICD-10-CM | POA: Diagnosis not present

## 2017-11-09 DIAGNOSIS — M4800 Spinal stenosis, site unspecified: Secondary | ICD-10-CM | POA: Diagnosis not present

## 2017-11-09 DIAGNOSIS — R269 Unspecified abnormalities of gait and mobility: Secondary | ICD-10-CM | POA: Diagnosis not present

## 2017-11-09 DIAGNOSIS — Z6841 Body Mass Index (BMI) 40.0 and over, adult: Secondary | ICD-10-CM | POA: Diagnosis not present

## 2017-11-14 DIAGNOSIS — R262 Difficulty in walking, not elsewhere classified: Secondary | ICD-10-CM | POA: Diagnosis not present

## 2017-11-14 DIAGNOSIS — R2689 Other abnormalities of gait and mobility: Secondary | ICD-10-CM | POA: Diagnosis not present

## 2017-11-14 DIAGNOSIS — Z9181 History of falling: Secondary | ICD-10-CM | POA: Diagnosis not present

## 2017-11-15 DIAGNOSIS — M7061 Trochanteric bursitis, right hip: Secondary | ICD-10-CM | POA: Diagnosis not present

## 2017-11-15 DIAGNOSIS — S62354D Nondisplaced fracture of shaft of fourth metacarpal bone, right hand, subsequent encounter for fracture with routine healing: Secondary | ICD-10-CM | POA: Diagnosis not present

## 2017-11-16 DIAGNOSIS — R2689 Other abnormalities of gait and mobility: Secondary | ICD-10-CM | POA: Diagnosis not present

## 2017-11-16 DIAGNOSIS — Z9181 History of falling: Secondary | ICD-10-CM | POA: Diagnosis not present

## 2017-11-16 DIAGNOSIS — R262 Difficulty in walking, not elsewhere classified: Secondary | ICD-10-CM | POA: Diagnosis not present

## 2017-11-24 DIAGNOSIS — R2689 Other abnormalities of gait and mobility: Secondary | ICD-10-CM | POA: Diagnosis not present

## 2017-11-24 DIAGNOSIS — Z9181 History of falling: Secondary | ICD-10-CM | POA: Diagnosis not present

## 2017-11-24 DIAGNOSIS — R262 Difficulty in walking, not elsewhere classified: Secondary | ICD-10-CM | POA: Diagnosis not present

## 2017-11-28 DIAGNOSIS — Z9181 History of falling: Secondary | ICD-10-CM | POA: Diagnosis not present

## 2017-11-28 DIAGNOSIS — R262 Difficulty in walking, not elsewhere classified: Secondary | ICD-10-CM | POA: Diagnosis not present

## 2017-11-28 DIAGNOSIS — R2689 Other abnormalities of gait and mobility: Secondary | ICD-10-CM | POA: Diagnosis not present

## 2017-11-29 DIAGNOSIS — M47816 Spondylosis without myelopathy or radiculopathy, lumbar region: Secondary | ICD-10-CM | POA: Diagnosis not present

## 2017-12-01 ENCOUNTER — Emergency Department (HOSPITAL_COMMUNITY): Payer: Medicare Other

## 2017-12-01 ENCOUNTER — Encounter (HOSPITAL_COMMUNITY): Payer: Self-pay | Admitting: *Deleted

## 2017-12-01 ENCOUNTER — Other Ambulatory Visit: Payer: Self-pay

## 2017-12-01 ENCOUNTER — Emergency Department (HOSPITAL_COMMUNITY)
Admission: EM | Admit: 2017-12-01 | Discharge: 2017-12-02 | Disposition: A | Discharge status: Home / Self Care | Payer: Medicare Other | Attending: Emergency Medicine | Admitting: Emergency Medicine

## 2017-12-01 DIAGNOSIS — M62838 Other muscle spasm: Secondary | ICD-10-CM

## 2017-12-01 DIAGNOSIS — Z7982 Long term (current) use of aspirin: Secondary | ICD-10-CM | POA: Diagnosis not present

## 2017-12-01 DIAGNOSIS — M549 Dorsalgia, unspecified: Secondary | ICD-10-CM | POA: Diagnosis not present

## 2017-12-01 DIAGNOSIS — R079 Chest pain, unspecified: Secondary | ICD-10-CM | POA: Diagnosis not present

## 2017-12-01 DIAGNOSIS — Z79899 Other long term (current) drug therapy: Secondary | ICD-10-CM | POA: Insufficient documentation

## 2017-12-01 DIAGNOSIS — R9431 Abnormal electrocardiogram [ECG] [EKG]: Secondary | ICD-10-CM | POA: Diagnosis not present

## 2017-12-01 DIAGNOSIS — M1990 Unspecified osteoarthritis, unspecified site: Secondary | ICD-10-CM | POA: Diagnosis not present

## 2017-12-01 DIAGNOSIS — Z87891 Personal history of nicotine dependence: Secondary | ICD-10-CM | POA: Diagnosis not present

## 2017-12-01 DIAGNOSIS — R0602 Shortness of breath: Secondary | ICD-10-CM | POA: Diagnosis not present

## 2017-12-01 DIAGNOSIS — I10 Essential (primary) hypertension: Secondary | ICD-10-CM | POA: Insufficient documentation

## 2017-12-01 DIAGNOSIS — I700 Atherosclerosis of aorta: Secondary | ICD-10-CM | POA: Diagnosis not present

## 2017-12-01 MED ORDER — CALCIUM CARBONATE-VITAMIN D 500-200 MG-UNIT PO TABS
2.0000 | ORAL_TABLET | Freq: Every day | ORAL | Status: DC
  Administered 2017-12-02: 2 via ORAL
  Filled 2017-12-01: qty 2

## 2017-12-01 MED ORDER — KETOROLAC TROMETHAMINE 30 MG/ML IJ SOLN
15.0000 mg | Freq: Once | INTRAMUSCULAR | Status: AC
  Administered 2017-12-02: 15 mg via INTRAVENOUS
  Filled 2017-12-01: qty 1

## 2017-12-01 MED ORDER — PANTOPRAZOLE SODIUM 40 MG PO TBEC: 40.0000 mg | DELAYED_RELEASE_TABLET | Freq: Every day | ORAL | Status: DC

## 2017-12-01 MED ORDER — LIDOCAINE 5 % EX PTCH
1.0000 | MEDICATED_PATCH | CUTANEOUS | Status: DC
  Administered 2017-12-02: 1 via TRANSDERMAL
  Filled 2017-12-01: qty 1

## 2017-12-01 MED ORDER — LORATADINE 10 MG PO TABS: 10.0000 mg | ORAL_TABLET | Freq: Every day | ORAL | Status: DC

## 2017-12-01 MED ORDER — METHOCARBAMOL 1000 MG/10ML IJ SOLN: 1000.0000 mg | Freq: Once | INTRAMUSCULAR | Status: DC

## 2017-12-01 MED ORDER — PRIMIDONE 50 MG PO TABS
150.0000 mg | ORAL_TABLET | Freq: Every day | ORAL | Status: DC
  Administered 2017-12-02: 150 mg via ORAL
  Filled 2017-12-01: qty 3

## 2017-12-01 MED ORDER — FAMOTIDINE 20 MG PO TABS
20.0000 mg | ORAL_TABLET | Freq: Every evening | ORAL | Status: DC | PRN
  Administered 2017-12-02: 20 mg via ORAL
  Filled 2017-12-01: qty 1

## 2017-12-01 MED ORDER — ASPIRIN EC 81 MG PO TBEC
81.0000 mg | DELAYED_RELEASE_TABLET | Freq: Every day | ORAL | Status: DC
  Administered 2017-12-02: 81 mg via ORAL
  Filled 2017-12-01: qty 1

## 2017-12-01 MED ORDER — METHOCARBAMOL 1000 MG/10ML IJ SOLN
1000.0000 mg | Freq: Once | INTRAVENOUS | Status: DC
  Filled 2017-12-01: qty 10

## 2017-12-01 NOTE — ED Triage Notes (Addendum)
PTAR states pt lives at [facility], she developed rt back pain last night relieved with Aleve, tonight started again without relief for Aleve. Pain location is upper left back at bra line, pt does state she has a history of gall stones

## 2017-12-01 NOTE — ED Notes (Signed)
Bed: WA09 Expected date:  Expected time:  Means of arrival:  Comments: 76 yo F  Back pain

## 2017-12-02 ENCOUNTER — Emergency Department (HOSPITAL_COMMUNITY): Payer: Medicare Other

## 2017-12-02 ENCOUNTER — Encounter (HOSPITAL_COMMUNITY): Payer: Self-pay

## 2017-12-02 DIAGNOSIS — M62838 Other muscle spasm: Secondary | ICD-10-CM | POA: Diagnosis not present

## 2017-12-02 DIAGNOSIS — R079 Chest pain, unspecified: Secondary | ICD-10-CM | POA: Diagnosis not present

## 2017-12-02 DIAGNOSIS — I700 Atherosclerosis of aorta: Secondary | ICD-10-CM | POA: Diagnosis not present

## 2017-12-02 DIAGNOSIS — R0602 Shortness of breath: Secondary | ICD-10-CM | POA: Diagnosis not present

## 2017-12-02 LAB — CBC WITH DIFFERENTIAL/PLATELET
BASOS PCT: 0 %
Basophils Absolute: 0 10*3/uL (ref 0.0–0.1)
Eosinophils Absolute: 0.2 10*3/uL (ref 0.0–0.7)
Eosinophils Relative: 1 %
HEMATOCRIT: 35.7 % — AB (ref 36.0–46.0)
Hemoglobin: 12 g/dL (ref 12.0–15.0)
Lymphocytes Relative: 12 %
Lymphs Abs: 1.5 10*3/uL (ref 0.7–4.0)
MCH: 31.5 pg (ref 26.0–34.0)
MCHC: 33.6 g/dL (ref 30.0–36.0)
MCV: 93.7 fL (ref 78.0–100.0)
MONO ABS: 1.3 10*3/uL — AB (ref 0.1–1.0)
MONOS PCT: 10 %
NEUTROS ABS: 9.9 10*3/uL — AB (ref 1.7–7.7)
Neutrophils Relative %: 77 %
Platelets: 201 10*3/uL (ref 150–400)
RBC: 3.81 MIL/uL — ABNORMAL LOW (ref 3.87–5.11)
RDW: 13.9 % (ref 11.5–15.5)
WBC: 12.9 10*3/uL — ABNORMAL HIGH (ref 4.0–10.5)

## 2017-12-02 LAB — URINALYSIS, ROUTINE W REFLEX MICROSCOPIC
Bilirubin Urine: NEGATIVE
Glucose, UA: NEGATIVE mg/dL
KETONES UR: NEGATIVE mg/dL
Nitrite: NEGATIVE
PH: 6 (ref 5.0–8.0)
PROTEIN: NEGATIVE mg/dL
Specific Gravity, Urine: 1.013 (ref 1.005–1.030)

## 2017-12-02 LAB — BASIC METABOLIC PANEL
ANION GAP: 7 (ref 5–15)
BUN: 18 mg/dL (ref 6–20)
CALCIUM: 8.5 mg/dL — AB (ref 8.9–10.3)
CO2: 23 mmol/L (ref 22–32)
Chloride: 105 mmol/L (ref 101–111)
Creatinine, Ser: 0.69 mg/dL (ref 0.44–1.00)
GFR calc non Af Amer: >60 mL/min (ref >60)
Glucose, Bld: 121 mg/dL — ABNORMAL HIGH (ref 65–99)
Potassium: 4.4 mmol/L (ref 3.5–5.1)
SODIUM: 135 mmol/L (ref 135–145)

## 2017-12-02 LAB — I-STAT TROPONIN, ED: Troponin i, poc: 0.01 ng/mL (ref 0.00–0.08)

## 2017-12-02 MED ORDER — CEPHALEXIN 500 MG PO CAPS
500.0000 mg | ORAL_CAPSULE | Freq: Once | ORAL | Status: AC
  Administered 2017-12-02: 500 mg via ORAL
  Filled 2017-12-02: qty 1

## 2017-12-02 MED ORDER — ACETAMINOPHEN 500 MG PO TABS
1000.0000 mg | ORAL_TABLET | Freq: Once | ORAL | Status: AC
  Administered 2017-12-02: 1000 mg via ORAL
  Filled 2017-12-02: qty 2

## 2017-12-02 MED ORDER — IOPAMIDOL (ISOVUE-370) INJECTION 76%
INTRAVENOUS | Status: AC
  Filled 2017-12-02: qty 100

## 2017-12-02 MED ORDER — DICLOFENAC SODIUM ER 100 MG PO TB24
100.0000 mg | ORAL_TABLET | Freq: Every day | ORAL | 0 refills | Status: DC
Start: 2017-12-02 — End: 2018-01-31

## 2017-12-02 MED ORDER — LIDOCAINE 5 % EX PTCH: 1.0000 | MEDICATED_PATCH | CUTANEOUS | 0 refills | Status: DC

## 2017-12-02 MED ORDER — DICLOFENAC SODIUM 75 MG PO TBEC
75.0000 mg | DELAYED_RELEASE_TABLET | Freq: Two times a day (BID) | ORAL | Status: DC
  Administered 2017-12-02: 75 mg via ORAL
  Filled 2017-12-02: qty 1

## 2017-12-02 MED ORDER — METHOCARBAMOL 500 MG PO TABS: 500.0000 mg | ORAL_TABLET | Freq: Two times a day (BID) | ORAL | 0 refills | Status: DC

## 2017-12-02 MED ORDER — IOPAMIDOL (ISOVUE-370) INJECTION 76%
100.0000 mL | Freq: Once | INTRAVENOUS | Status: AC | PRN
  Administered 2017-12-02: 100 mL via INTRAVENOUS

## 2017-12-02 NOTE — ED Provider Notes (Signed)
[facility] DEPT Provider Note   CSN: [id number] Arrival date & time: 12/01/17  2142     History   Chief Complaint Chief Complaint  Patient presents with  . Back Pain    left    HPI [name] is a 76 y.o. female.  The history is provided by the patient.  Back Pain   This is a new problem. The current episode started yesterday. The problem occurs constantly. The problem has not changed since onset.The pain is associated with lifting heavy objects. The pain is present in the thoracic spine. The pain does not radiate. The pain is moderate. The pain is the same all the time. Pertinent negatives include no chest pain, no fever, no numbness, no weight loss, no headaches, no abdominal pain, no abdominal swelling, no bowel incontinence, no perianal numbness, no bladder incontinence, no dysuria, no pelvic pain, no leg pain, no paresthesias, no paresis, no tingling and no weakness. She has tried nothing for the symptoms. The treatment provided no relief. Risk factors include obesity.  Started lifted bags while shopping 2 days ago.  No DOE, no SOB, no weakness, no numbness, no no f/c/r.    Past Medical History:  Diagnosis Date  . 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 Benign familial tremor   . Clostridium difficile infection   . DDD (degenerative disc disease), lumbar   . Diverticulosis   . DVT (deep venous thrombosis) (HCC)    left leg  . Gallstones   . GERD (gastroesophageal reflux disease)   . Hypertension   . Osteoporosis   . Wears glasses     Patient Active Problem List   Diagnosis Date Noted  . Sleep apnea, obstructive 07/29/2014  . Essential tremor 04/24/2014    Past Surgical History:  Procedure Laterality Date  . ABDOMINAL HYSTERECTOMY  1983  . arterial vascular fistula  2004   coiled at [place]  [phone]  . CARPAL TUNNEL RELEASE  right  . CHOLECYSTECTOMY N/A 03/29/2013   Procedure: LAPAROSCOPIC  CHOLECYSTECTOMY WITH INTRAOPERATIVE CHOLANGIOGRAM;  Surgeon: Haywood Lasso, MD;  Location: [facility];  Service: General;  Laterality: N/A;  . EUS N/A 03/09/2013   Procedure: UPPER ENDOSCOPIC ULTRASOUND (EUS) LINEAR;  Surgeon: Beryle Beams, MD;  Location: [facility];  Service: Endoscopy;  Laterality: N/A;  . LUMBAR LAMINECTOMY/DECOMPRESSION MICRODISCECTOMY  11/30/2012   Procedure: LUMBAR LAMINECTOMY/DECOMPRESSION MICRODISCECTOMY 2 LEVELS;  Surgeon: Magnus Sinning, MD;  Location: [facility];  Service: Orthopedics;  Laterality: N/A;  DECOMPRESSION LAMINECTOMY L4-L5, L5-S1 CENTRAL   . ROTATOR CUFF REPAIR Bilateral 2004   two on right, one on left  . TRIGGER FINGER RELEASE Right     hand    OB History    No data available       Home Medications    Prior to Admission medications   Medication Sig Start Date End Date Taking? Authorizing Provider  amLODipine (NORVASC) 5 MG tablet Take 5 mg by mouth daily.   Yes [provider]  aspirin EC 81 MG tablet Take 81 mg by mouth at bedtime.   Yes [provider]  calcium citrate-vitamin D (CITRACAL+D) 315-200 MG-UNIT tablet Take 2 tablets by mouth at bedtime.   Yes [provider]  Cholecalciferol (VITAMIN D3) 2000 units capsule Take 1 capsule by mouth at bedtime.   Yes [provider]  escitalopram (LEXAPRO) 10 MG tablet Take 10 mg by mouth daily. 02/26/16  Yes [provider]  esomeprazole (NEXIUM) 40 MG capsule Take 40 mg by mouth at bedtime.  10/01/15  Yes [provider]  famotidine (PEPCID) 20 MG tablet Take 20 mg by mouth at bedtime as needed for heartburn or indigestion.   Yes [provider]  loratadine (CLARITIN) 10 MG tablet Take 10 mg by mouth at bedtime.   Yes [provider]  mirabegron ER (MYRBETRIQ) 50 MG TB24 tablet Take 50 mg by mouth daily.  02/13/16  Yes [provider]  olmesartan (BENICAR) 20 MG tablet Take 20 mg by mouth daily.    Yes [provider]  primidone (MYSOLINE) 50 MG tablet Take 3 tablets (150 mg total) by mouth at bedtime. 09/02/17  Yes Tat, Eustace Quail, DO  traMADol (ULTRAM) 50 MG tablet Take 50 mg by mouth daily as needed. 12/19/15  Yes [provider]  trimethoprim (TRIMPEX) 100 MG tablet Take 100 mg by mouth daily.  03/19/15  Yes [provider]  valsartan (DIOVAN) 160 MG tablet Take 160 mg by mouth daily. 02/13/16  Yes [provider]  Diclofenac Sodium CR (VOLTAREN-XR) 100 MG 24 hr tablet Take 1 tablet (100 mg total) by mouth daily. 12/02/17   Marshell Rieger, MD  lidocaine (LIDODERM) 5 % Place 1 patch onto the skin daily. Remove & Discard patch within 12 hours or as directed by MD 12/02/17   Randal Buba, Random Dobrowski, MD  methocarbamol (ROBAXIN) 500 MG tablet Take 1 tablet (500 mg total) by mouth 2 (two) times daily. 12/02/17   Dheeraj Hail, MD    Family History Family History  Problem Relation Age of Onset  . Other Mother        sarcoma  . Other Father        broken hip  . Dementia Father   . Pneumonia Father   . Clotting disorder Sister     Social History Social History   Tobacco Use  . Smoking status: Former Smoker    Packs/day: 0.50    Types: Cigarettes    Last attempt to quit: 07/11/1970    Years since quitting: 47.4  . Smokeless tobacco: Never Used  Substance Use Topics  . Alcohol use: Yes    Alcohol/week: 0.0 oz    Comment: wine occasionally  . Drug use: No     Allergies   Buprenorphine hcl; Codeine; and Morphine and related   Review of Systems Review of Systems  Constitutional: Negative for fever and weight loss.  Respiratory: Negative for choking and shortness of breath.   Cardiovascular: Negative for chest pain, palpitations and leg swelling.  Gastrointestinal: Negative for abdominal pain and bowel incontinence.  Genitourinary: Negative for bladder incontinence, dysuria and pelvic pain.  Musculoskeletal: Positive for back pain. Negative for gait  problem.  Neurological: Negative for tingling, weakness, numbness, headaches and paresthesias.  All other systems reviewed and are negative.    Physical Exam Updated Vital Signs BP (!) 168/75 (BP Location: Left Arm)   Pulse 76   Temp 98 F (36.7 C) (Oral)   Resp 18   SpO2 96%   Physical Exam  Constitutional: She is oriented to person, place, and time. She appears well-developed and well-nourished. No distress.  HENT:  Head: Normocephalic and atraumatic.  Mouth/Throat: No oropharyngeal exudate.  Eyes: Conjunctivae are normal. Pupils are equal, round, and reactive to light.  Neck: Normal range of motion. Neck supple. No JVD present. No tracheal deviation present.  Cardiovascular: Normal rate, regular rhythm, normal heart sounds and intact  distal pulses.  Pulmonary/Chest: Effort normal and breath sounds normal. No stridor. She has no wheezes. She has no rales.      Abdominal: Soft. Bowel sounds are normal. She exhibits no mass. There is no tenderness. There is no rebound and no guarding.  Musculoskeletal: Normal range of motion.       Cervical back: Normal.       Lumbar back: Normal.  Neurological: She is alert and oriented to person, place, and time. She displays normal reflexes. She exhibits normal muscle tone. Coordination normal.  Skin: Skin is warm and dry. Capillary refill takes less than 2 seconds.  Psychiatric: She has a normal mood and affect.     ED Treatments / Results  Labs (all labs ordered are listed, but only a E Results for orders placed or performed during the hospital encounter of 12/01/17  CBC with Differential/Platelet  Result Value Ref Range   WBC 12.9 (H) 4.0 - 10.5 K/uL   RBC 3.81 (L) 3.87 - 5.11 MIL/uL   Hemoglobin 12.0 12.0 - 15.0 g/dL   HCT 35.7 (L) 36.0 - 46.0 %   MCV 93.7 78.0 - 100.0 fL   MCH 31.5 26.0 - 34.0 pg   MCHC 33.6 30.0 - 36.0 g/dL   RDW 13.9 11.5 - 15.5 %   Platelets 201 150 - 400 K/uL   Neutrophils Relative % 77 %   Neutro  Abs 9.9 (H) 1.7 - 7.7 K/uL   Lymphocytes Relative 12 %   Lymphs Abs 1.5 0.7 - 4.0 K/uL   Monocytes Relative 10 %   Monocytes Absolute 1.3 (H) 0.1 - 1.0 K/uL   Eosinophils Relative 1 %   Eosinophils Absolute 0.2 0.0 - 0.7 K/uL   Basophils Relative 0 %   Basophils Absolute 0.0 0.0 - 0.1 K/uL  Urinalysis, Routine w reflex microscopic  Result Value Ref Range   Color, Urine YELLOW YELLOW   APPearance CLEAR CLEAR   Specific Gravity, Urine 1.013 1.005 - 1.030   pH 6.0 5.0 - 8.0   Glucose, UA NEGATIVE NEGATIVE mg/dL   Hgb urine dipstick SMALL (A) NEGATIVE   Bilirubin Urine NEGATIVE NEGATIVE   Ketones, ur NEGATIVE NEGATIVE mg/dL   Protein, ur NEGATIVE NEGATIVE mg/dL   Nitrite NEGATIVE NEGATIVE   Leukocytes, UA TRACE (A) NEGATIVE   RBC / HPF 0-5 0 - 5 RBC/hpf   WBC, UA 6-30 0 - 5 WBC/hpf   Bacteria, UA MANY (A) NONE SEEN   Squamous Epithelial / LPF 0-5 (A) NONE SEEN   Mucus PRESENT   Basic metabolic panel  Result Value Ref Range   Sodium 135 135 - 145 mmol/L   Potassium 4.4 3.5 - 5.1 mmol/L   Chloride 105 101 - 111 mmol/L   CO2 23 22 - 32 mmol/L   Glucose, Bld 121 (H) 65 - 99 mg/dL   BUN 18 6 - 20 mg/dL   Creatinine, Ser 0.69 0.44 - 1.00 mg/dL   Calcium 8.5 (L) 8.9 - 10.3 mg/dL   GFR calc non Af Amer >60 >60 mL/min   GFR calc Af Amer >60 >60 mL/min   Anion gap 7 5 - 15  I-stat troponin, ED  Result Value Ref Range   Troponin i, poc 0.01 0.00 - 0.08 ng/mL   Comment 3           Dg Chest 2 View  Result Date: 12/02/2017 CLINICAL DATA:  Chest pain for 3 days.  Shortness of breath. EXAM: CHEST  2 VIEW COMPARISON:  Radiographs 03/02/2013 FINDINGS: Mild cardiomegaly. Aortic atherosclerosis and tortuosity. No pulmonary edema, consolidation, pleural effusion or pneumothorax. Surgical anchors in the right humeral head. No acute osseous abnormalities. IMPRESSION: Mild cardiomegaly with aortic tortuosity and atherosclerosis. No acute pulmonary process. Electronically Signed   By: Jeb Levering M.D.   On: 12/02/2017 00:21   Ct Angio Chest/abd/pel For Dissection W And/or Wo Contrast  Result Date: 12/02/2017 CLINICAL DATA:  Left upper back pain. EXAM: CT ANGIOGRAPHY CHEST, ABDOMEN AND PELVIS TECHNIQUE: Multidetector CT imaging through the chest, abdomen and pelvis was performed using the standard protocol during bolus administration of intravenous contrast. Multiplanar reconstructed images and MIPs were obtained and reviewed to evaluate the vascular anatomy. CONTRAST:  100 cc Isovue 370 IV COMPARISON:  Chest radiograph earlier this day. FINDINGS: CTA CHEST FINDINGS Cardiovascular: Motion artifact through the ascending aorta. No aortic dissection, aneurysm, or hematoma. Mild atherosclerosis. Two-vessel arch with common origin of the brachiocephalic and left common carotid artery. Mild cardiomegaly. No pericardial effusion. No central pulmonary embolus. Mediastinum/Nodes: No enlarged mediastinal or hilar lymph nodes. No axillary adenopathy. The esophagus is decompressed. Lungs/Pleura: 5 mm left apical pulmonary nodule image 15 series 7. No consolidation. The trachea and mainstem bronchi are patent. No pulmonary edema or pleural effusion. Musculoskeletal: There are no acute or suspicious osseous abnormalities. Remote right anterior rib fractures. Degenerative change in the lower thoracic spine. Review of the MIP images confirms the above findings. CTA ABDOMEN AND PELVIS FINDINGS VASCULAR Aorta: Normal caliber aorta without aneurysm, dissection, vasculitis or significant stenosis. Moderate aortic atherosclerosis. Celiac: Patent without evidence of aneurysm, dissection, vasculitis or significant stenosis. SMA: Patent without evidence of aneurysm, dissection, vasculitis or significant stenosis. Renals: Both renal arteries are patent without evidence of aneurysm, dissection, vasculitis, fibromuscular dysplasia or significant stenosis. IMA: Patent without evidence of aneurysm, dissection, vasculitis or  significant stenosis. Inflow: Patent without evidence of aneurysm, dissection, vasculitis or significant stenosis. Veins: No obvious venous abnormality within the limitations of this arterial phase study. Review of the MIP images confirms the above findings. NON-VASCULAR Hepatobiliary: Subcapsular 7 mm hypodensity in the right lobe is too small to [place] days. Postcholecystectomy. Prominent common bile duct measuring 13 mm. No calcified choledocholithiasis. Pancreas: No ductal dilatation or inflammation. Spleen: Normal in size without focal abnormality. Small splenule anteriorly. Adrenals/Urinary Tract: Normal adrenal glands. No hydronephrosis. No perinephric edema. Probable nonobstructing stone in the lower left kidney. Urinary bladder is physiologically distended, no wall thickening. Stomach/Bowel: Sigmoid colonic diverticulosis and tortuosity. No diverticulitis. No bowel wall thickening or inflammation. Normal air-filled appendix. Stomach is nondistended. Lymphatic: No enlarged abdominal or pelvic lymph nodes. Reproductive: Status post hysterectomy. No adnexal masses. Other: Tiny fat containing umbilical hernia. No free air, free fluid, or intra-abdominal fluid collection. Musculoskeletal: Degenerative change in the lumbar spine with grade 1 degenerative anterolisthesis of L4 on L5 and L5 on S1 with facet arthropathy and degenerative disc disease. Remote right inferior pubic ramus fracture. There are no acute or suspicious osseous abnormalities. Review of the MIP images confirms the above findings. IMPRESSION: 1. Aortic atherosclerosis without acute abnormality. No aortic aneurysm. 2. Postcholecystectomy with prominent common bile duct measuring 13 mm. Recommend correlation with LFTs. If LFTs are elevated, recommend further evaluation with MRCP, ERCP, or EUS. If LFTs are normal, no further evaluation is needed. 3. Otherwise no acute abnormality in the chest, abdomen, or pelvis. 4. Nonobstructing left renal stone.  Colonic diverticulosis without diverticulitis. 5. Left upper lobe 5 mm pulmonary nodule. No follow-up needed if patient is low-risk.  Non-contrast chest CT can be considered in 12 months if patient is high-risk. This recommendation follows the consensus statement: Guidelines for Management of Incidental Pulmonary Nodules Detected on CT Images: From the Fleischner Society 2017; Radiology 2017; 284:228-243. Aortic Atherosclerosis (ICD10-I70.0). Electronically Signed   By: Jeb Levering M.D.   On: 12/02/2017 04:49   KG  EKG Interpretation  Date/Time:  Thursday December 01 2017 23:40:17 EST Ventricular Rate:  84 PR Interval:    QRS Duration: 92 QT Interval:  386 QTC Calculation: 457 R Axis:   -2 Text Interpretation:  Sinus rhythm Borderline prolonged PR interval Confirmed by Dory Horn) on 12/01/2017 11:46:15 PM       Radiology Dg Chest 2 View  Result Date: 12/02/2017 CLINICAL DATA:  Chest pain for 3 days.  Shortness of breath. EXAM: CHEST  2 VIEW COMPARISON:  Radiographs 03/02/2013 FINDINGS: Mild cardiomegaly. Aortic atherosclerosis and tortuosity. No pulmonary edema, consolidation, pleural effusion or pneumothorax. Surgical anchors in the right humeral head. No acute osseous abnormalities. IMPRESSION: Mild cardiomegaly with aortic tortuosity and atherosclerosis. No acute pulmonary process. Electronically Signed   By: Jeb Levering M.D.   On: 12/02/2017 00:21   Ct Angio Chest/abd/pel For Dissection W And/or Wo Contrast  Result Date: 12/02/2017 CLINICAL DATA:  Left upper back pain. EXAM: CT ANGIOGRAPHY CHEST, ABDOMEN AND PELVIS TECHNIQUE: Multidetector CT imaging through the chest, abdomen and pelvis was performed using the standard protocol during bolus administration of intravenous contrast. Multiplanar reconstructed images and MIPs were obtained and reviewed to evaluate the vascular anatomy. CONTRAST:  100 cc Isovue 370 IV COMPARISON:  Chest radiograph earlier this day.  FINDINGS: CTA CHEST FINDINGS Cardiovascular: Motion artifact through the ascending aorta. No aortic dissection, aneurysm, or hematoma. Mild atherosclerosis. Two-vessel arch with common origin of the brachiocephalic and left common carotid artery. Mild cardiomegaly. No pericardial effusion. No central pulmonary embolus. Mediastinum/Nodes: No enlarged mediastinal or hilar lymph nodes. No axillary adenopathy. The esophagus is decompressed. Lungs/Pleura: 5 mm left apical pulmonary nodule image 15 series 7. No consolidation. The trachea and mainstem bronchi are patent. No pulmonary edema or pleural effusion. Musculoskeletal: There are no acute or suspicious osseous abnormalities. Remote right anterior rib fractures. Degenerative change in the lower thoracic spine. Review of the MIP images confirms the above findings. CTA ABDOMEN AND PELVIS FINDINGS VASCULAR Aorta: Normal caliber aorta without aneurysm, dissection, vasculitis or significant stenosis. Moderate aortic atherosclerosis. Celiac: Patent without evidence of aneurysm, dissection, vasculitis or significant stenosis. SMA: Patent without evidence of aneurysm, dissection, vasculitis or significant stenosis. Renals: Both renal arteries are patent without evidence of aneurysm, dissection, vasculitis, fibromuscular dysplasia or significant stenosis. IMA: Patent without evidence of aneurysm, dissection, vasculitis or significant stenosis. Inflow: Patent without evidence of aneurysm, dissection, vasculitis or significant stenosis. Veins: No obvious venous abnormality within the limitations of this arterial phase study. Review of the MIP images confirms the above findings. NON-VASCULAR Hepatobiliary: Subcapsular 7 mm hypodensity in the right lobe is too small to [place] days. Postcholecystectomy. Prominent common bile duct measuring 13 mm. No calcified choledocholithiasis. Pancreas: No ductal dilatation or inflammation. Spleen: Normal in size without focal abnormality. Small  splenule anteriorly. Adrenals/Urinary Tract: Normal adrenal glands. No hydronephrosis. No perinephric edema. Probable nonobstructing stone in the lower left kidney. Urinary bladder is physiologically distended, no wall thickening. Stomach/Bowel: Sigmoid colonic diverticulosis and tortuosity. No diverticulitis. No bowel wall thickening or inflammation. Normal air-filled appendix. Stomach is nondistended. Lymphatic: No enlarged abdominal or pelvic lymph nodes. Reproductive: Status post hysterectomy. No adnexal masses.  Other: Tiny fat containing umbilical hernia. No free air, free fluid, or intra-abdominal fluid collection. Musculoskeletal: Degenerative change in the lumbar spine with grade 1 degenerative anterolisthesis of L4 on L5 and L5 on S1 with facet arthropathy and degenerative disc disease. Remote right inferior pubic ramus fracture. There are no acute or suspicious osseous abnormalities. Review of the MIP images confirms the above findings. IMPRESSION: 1. Aortic atherosclerosis without acute abnormality. No aortic aneurysm. 2. Postcholecystectomy with prominent common bile duct measuring 13 mm. Recommend correlation with LFTs. If LFTs are elevated, recommend further evaluation with MRCP, ERCP, or EUS. If LFTs are normal, no further evaluation is needed. 3. Otherwise no acute abnormality in the chest, abdomen, or pelvis. 4. Nonobstructing left renal stone. Colonic diverticulosis without diverticulitis. 5. Left upper lobe 5 mm pulmonary nodule. No follow-up needed if patient is low-risk. Non-contrast chest CT can be considered in 12 months if patient is high-risk. This recommendation follows the consensus statement: Guidelines for Management of Incidental Pulmonary Nodules Detected on CT Images: From the Fleischner Society 2017; Radiology 2017; 284:228-243. Aortic Atherosclerosis (ICD10-I70.0). Electronically Signed   By: Jeb Levering M.D.   On: 12/02/2017 04:49    Procedures Procedures (including  critical care time)  Medications Ordered in ED Medications  lidocaine (LIDODERM) 5 % 1 patch (1 patch Transdermal Patch Applied 12/02/17 0031)  aspirin EC tablet 81 mg (81 mg Oral Given 12/02/17 0028)  calcium-vitamin D (OSCAL WITH D) 500-200 MG-UNIT per tablet 2 tablet (2 tablets Oral Given 12/02/17 0028)  pantoprazole (PROTONIX) EC tablet 40 mg (not administered)  famotidine (PEPCID) tablet 20 mg (20 mg Oral Given 12/02/17 0028)  loratadine (CLARITIN) tablet 10 mg (10 mg Oral Not Given 12/02/17 0350)  primidone (MYSOLINE) tablet 150 mg (150 mg Oral Given 12/02/17 0028)  methocarbamol (ROBAXIN) 1,000 mg in dextrose 5 % 50 mL IVPB (0 mg Intravenous Hold 12/02/17 0038)  iopamidol (ISOVUE-370) 76 % injection (not administered)  acetaminophen (TYLENOL) tablet 1,000 mg (not administered)  diclofenac (VOLTAREN) EC tablet 75 mg (not administered)  ketorolac (TORADOL) 30 MG/ML injection 15 mg (15 mg Intravenous Given 12/02/17 0028)  cephALEXin (KEFLEX) capsule 500 mg (500 mg Oral Given 12/02/17 0224)  iopamidol (ISOVUE-370) 76 % injection 100 mL (100 mLs Intravenous Contrast Given 12/02/17 0358)       Final Clinical Impressions(s) / ED Diagnoses   Final diagnoses:  Muscle spasm   Pain is a muscle spasm in nature ruled out for MI dissection in the ED. Is on chronic antibiotics for UTI by her urologist.  On tramadol at home with add NSAIDS muscle relaxants and lidoderm.  Have verbally and in writing informed patient of need for repeat chest CT to assess stability of pulmonary nodule seen on CT.  Patient and son verbalize understanding and agree to follow up. No driving nor drinking alcohol on your prescriptions   Return for fevers > 100.4, global weakness, stiff neck, intractable vomiting, or diarrhea, abdominal pain, Inability to tolerate liquids or food, cough, altered mental status or any concerns. No signs of systemic illness or infection. The patient is nontoxic-appearing on exam and vital  signs are within normal limits.    I have reviewed the triage vital signs and the nursing notes. Pertinent labs &imaging results that were available during my care of the patient were reviewed by me and considered in my medical decision making (see chart for details).  After history, exam, and medical workup I feel the patient has been appropriately medically screened and is  safe for discharge home. Pertinent diagnoses were discussed with the patient. Patient was given return precautions   ED Discharge Orders        Ordered    Diclofenac Sodium CR (VOLTAREN-XR) 100 MG 24 hr tablet  Daily     12/02/17 0522    lidocaine (LIDODERM) 5 %  Every 24 hours     12/02/17 0522    methocarbamol (ROBAXIN) 500 MG tablet  2 times daily     12/02/17 0522       Mattheo Swindle, MD 12/02/17 [phone]

## 2017-12-02 NOTE — ED Notes (Signed)
Patient transported to CT 

## 2017-12-07 DIAGNOSIS — N3020 Other chronic cystitis without hematuria: Secondary | ICD-10-CM | POA: Diagnosis not present

## 2017-12-09 DIAGNOSIS — R350 Frequency of micturition: Secondary | ICD-10-CM | POA: Diagnosis not present

## 2017-12-14 DIAGNOSIS — K30 Functional dyspepsia: Secondary | ICD-10-CM | POA: Diagnosis not present

## 2017-12-14 DIAGNOSIS — M546 Pain in thoracic spine: Secondary | ICD-10-CM | POA: Diagnosis not present

## 2017-12-14 DIAGNOSIS — R911 Solitary pulmonary nodule: Secondary | ICD-10-CM | POA: Diagnosis not present

## 2017-12-14 DIAGNOSIS — Z6841 Body Mass Index (BMI) 40.0 and over, adult: Secondary | ICD-10-CM | POA: Diagnosis not present

## 2017-12-26 DIAGNOSIS — Z9181 History of falling: Secondary | ICD-10-CM | POA: Diagnosis not present

## 2017-12-26 DIAGNOSIS — R262 Difficulty in walking, not elsewhere classified: Secondary | ICD-10-CM | POA: Diagnosis not present

## 2017-12-26 DIAGNOSIS — R2689 Other abnormalities of gait and mobility: Secondary | ICD-10-CM | POA: Diagnosis not present

## 2017-12-27 DIAGNOSIS — R350 Frequency of micturition: Secondary | ICD-10-CM | POA: Diagnosis not present

## 2017-12-27 DIAGNOSIS — M7061 Trochanteric bursitis, right hip: Secondary | ICD-10-CM | POA: Diagnosis not present

## 2017-12-27 DIAGNOSIS — M545 Low back pain: Secondary | ICD-10-CM | POA: Diagnosis not present

## 2018-01-03 DIAGNOSIS — N3946 Mixed incontinence: Secondary | ICD-10-CM | POA: Diagnosis not present

## 2018-01-03 DIAGNOSIS — R350 Frequency of micturition: Secondary | ICD-10-CM | POA: Diagnosis not present

## 2018-01-12 ENCOUNTER — Telehealth: Payer: Self-pay | Admitting: Neurology

## 2018-01-13 DIAGNOSIS — R350 Frequency of micturition: Secondary | ICD-10-CM | POA: Diagnosis not present

## 2018-01-13 DIAGNOSIS — N3946 Mixed incontinence: Secondary | ICD-10-CM | POA: Diagnosis not present

## 2018-01-17 DIAGNOSIS — R350 Frequency of micturition: Secondary | ICD-10-CM | POA: Diagnosis not present

## 2018-01-24 DIAGNOSIS — R350 Frequency of micturition: Secondary | ICD-10-CM | POA: Diagnosis not present

## 2018-01-30 NOTE — Progress Notes (Signed)
[name] was seen today in the movement disorders clinic for neurologic consultation at the request of [name], MD.  The consultation is for the evaluation of tremor.  The records that were made available to me were reviewed.  Pt is R hand dominant.  Pt reports tremor for 20-30 years, but was mild when it started.  It started in both hands.  Her father had the same thing, as did her sister and her son now has the same thing.  It didn't really start to bother her until 5-6 years ago.  She started on primidone at that time, 50 mg and it helped.  It was increased to 100 mg and it helped as well.  No SE with the medication.  Over the last few years, tremor has continued to pick up, and she is noticing it in the legs now as well.  She notices that anxiety/stress will increase the tremor.   In January, the patient ran out of her primidone for 4-5 days and she really noted a deterioration in the tremor.  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  She does not necessarily doze unexpectedly, but is very sleepy during the day.  She has an appointment with [name] to discuss this.   07/29/14 update:  Last visit, I increased the patients primidone to 50 mg in the AM and she is on 100 mg at night.  She is doing better in terms of tremor.  She has good days and bad days and she knows that stress makes it worse.  She did see [name] since last visit and I reviewed those notes.  She had a PSG.  She had an AHI of 60.6 but this was all in supine position and AHI in lateral position was 0.0 (but I was unsure of how much time, if any, was spent in the lateral position).  CPAP at 6 was recommended but she admits that she "hasn't given in to that" and wants to talk with Dr. [name].    01/29/15 update:  Pt returns today.  She had called and wanted to try to increase the primidone to 100 mg bid but it made her too sleepy.  She is therefore just on 2 at night.  She generally is doing  okay, but her husband is physically not well and that stress will increase tremor.  Husband with dementia and she is caregiving.  She is seeking out advice from psychologist.  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  The records that were made available to me were reviewed.  Less sleepy during the day with CPAP.    08/01/15 update:  The patient returns today for follow-up.  I have reviewed records since her last visit.  She has a history of essential tremor.  She is currently on primidone, 100 mg at night.  Daytime dosages have caused sleepiness.  She did follow-up with [name] on 04/14/2015 in regards to her sleep apnea.  She has been very compliant with this.  She reports that she is doing worse.  She states that she can hardly get food in the mouth at times.  She has no asthma.  She states that she has tried to relieve stress (primary caregiver for her husband who is ill) through painting and when she tried to sign the painting today, she couldn't do it.  She has never been on a beta blocker.  She does state  that she has been ill recently and is on an aquatic's, but she is not on any prednisone.  11/04/15 update:  The patient is following up today regarding her tremor.  I have reviewed records since our last visit.  She is on primidone, 100 mg at night.  Last visit, I cautiously started her on Inderal LA, 80 mg daily.  I told her to call me with any side effects or problems.  I have not heard from her, but it appears that she has been in the emergency room multiple times.  She went to the emergency room on 08/11/2015 after a fall on her arm.  She states that with this one she misstepped.  A CT of the brain was performed and was negative.   A few days later she twisted wrong in the kitchen and fell.   On 08/24/2015 she went back to the emergency room with another fall.  She fell in the kitchen and hit her head on the dishwasher.  Her scalp wound was repaired with sutures and staples and she was advised to discontinue  her Inderal.  Of note was that her pulse was 66 in the emergency room and blood pressure was 152/72.  She followed up with her primary care physician and had an MRI of the brain on 09/07/2015.  I had the opportunity to review this.  It was nonacute.  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  Since those falls, she has gone to PT and gone to the gym.  She loved the PT but she states that sx's are so erratic that she may not know it was going to occur.  She went back to the emergency room on 08/28/2015 with complaints of knee pain, after her right leg gave out while walking in a restaurant.  She had one other fall where she tripped over a throw rug at a friends house.  She attributes the last 2 falls to back pain.    She saw [name] on 10/02/2015 with excessive daytime hypersomnolence.  [name] interrogated her machine and she was very compliant with her CPAP and there was no significant mask leak.  [name] suggested perhaps decreasing her Inderal and also started her on Provigil, 100 mg daily.  The patient didn't try the provigil yet as she was worried about SE that [name] talked to her about.  Separate from all of this, she c/o sweats.  She is unsure of how long she has been on cymbalta or how long the sweats have been.  Her pharmacist suggested that the sweats may have been from propranolol.  02/04/16 update:  The patient follows up today for essential tremor.  She remains on primidone, 100 mg at night.  Daytime dosages have made her sleepy, so she is only on nighttime dosages.  Last visit, I changed her from Inderal LA, 80 mg to propranolol 20 mg twice a day, primarily because she started falling after I added the Inderal LA.  She called me not long after I did that to let me know that while she was no longer falling, she could not tolerate the tremor and wanted to go back up on the medication.  I was very leery about this, but  we ultimately told her she can go up to 20 mg 3 times a day.  We called her shortly thereafter and she stated that she was only doing 3 times a  day as needed.  She states today, however, that she is only taking 20 mg at night unless she has a "lunch date" and then she may take an extra during the day.   Last visit, she was also complaining about significant diaphoresis and I told her that it could be the Cymbalta and told her that perhaps she could discuss this with her prescribing physician.  She actually thinks that lowering the propranolol helped the sweating but she is still sweating (but states that weather is bit cooler).  Unfortunately, despite the change in medication, she has had a few more falls.  The last fall was a week ago.  She had her cane and was coming out of the store and she "stubbed" her foot and fell.  No dizziness associated with the falls.  She is in her 2nd session of PT in [place].    09/02/16 update:  Pt f/u today.  This patient is accompanied in the office by her daughter who supplements the history.  The records that were made available to me were reviewed since last visit.  On primidone - 100 mg q hs but she d/c her propranolol since last visit.  Last visit, we discussed EMG for peripheral neuropathy and the patient called back the day after the last visit and wanted to schedule that but she ultimately cancelled the appt.  She fell in March and the only other fall was on the grass a few weeks ago at church as she was on uneven terrain.  She continues to caregive and is stressed but is seeing her counselor.  Reports that in April she had a DVT and is now on eloquis.  Not long after starting on eloquis, she noted a sharp shooting pain in the L face and L eye.  States that she saw PCP and did not have suspicion for temporal arteritis.  It generally has gotten better and is not coming often now.  She cannot remember the last time that she had that.  The patient had an MRI of the brain on  08/31/16 that I had the opportunity to review.  There was significant/severe white matter disease but it had not changed since 2016.  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  I reviewed this with pt/daughter.  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  She states that she thinks that last time she had angiogram was at her AVM coiling in the 1990's at [facility].    Still worries about her memory but does think that stress is affecting that.  Husband with dementia.  Looking into moving into assisted living.  12/14/16 update:  Patient f/u today.  Last visit, I did have to stop her primidone, just while she was on Eliquis.  She is now off of the Eliquis and restarted her primidone.  "Boy am I glad to be back on that."  She is back on 100 mg at night of the primidone.  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  She was very worried about memory  change last visit and subsequently underwent neuropsych testing on 09/22/2016.  She has seen Dr. Si Raider for follow-up and results.  As suspected, there was no evidence of dementia.  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  States that she was so glad that she did that as she just needed confirmation that she did not have dementia.  States that she has colitis and was told that it was from anxiety.  Had diarrhea x 4-5 weeks.  Getting ready to move to [facility] at independent assisted living and that has been stressful.  Husbands health declining  both physically and mentally.  Pt is seeing counselor, Jackelyn Hoehn at [facility].  When asked if lexapro was helping, she states that her psychologist recommended a change but she thinks that changing previously didn't help so she is Chiropodist.  She does think that the lexapro is helping.  She was previously on effexor.  Wellbutrin caused SE.  Been on cymbalta in the past but caused sweats.    04/13/17 update:  Pt seen in f/u today.  On primidone 100 mg q hs and stable in regards to tremor.  In regards to falls states that she is doing well.  Moved to [place] and moved to independent living. Was a very stressful move for her and caused anxiety/depression for her.  Feels that she is adjusting better.  Still seeing Jackelyn Hoehn and he suggested he suggested talking to Dr. [name] and Trintellix was started.  It helped.  It did cause itching but patient states it is intermittent and she is trying to tolerate that.   Developed C. Diff in Jan (complained last visit of diarrhea but hadn't yet been diagnosed).    09/02/17 update:  Patient seen today in follow-up for essential tremor.  The records that were made available to me were reviewed.    I increased her primidone last visit cautiously and slowly because she had had hypersomnolence with that in the past.  My goal is to get her up to 50 mg in the morning and 100 mg at night.  She states that this just made her too sleepy and she ended up going back to 2 tablets at night.  She is using her walker at all times.  She is still seeing Jackelyn Hoehn for counseling for depression.  States that she is still adjusting to moving to [place] but she is doing somewhat better.  States that she has had a lot of anxiety.  Lots going on in personal life and finds herself worried about that.  Memory isn't as good.  She is faithful with CPAP but still has EDS.  Did fall with her husband at [employer].  They made each other fall.  She fell in the living room last week because she  tripped over her husbands shoes.    01/31/18 update: Patient is seen today in follow-up for essential tremor.  I have reviewed records made available to me.  She is currently on primidone, 150 mg at night (unable to tolerate daytime dosages).  She reports that she isn't sure how much she is taking as son was doing all medications.  She thinks that she is only taking 2 and "if I am taking 2 and can take 3, I would like to."  Cannot write name due to tremor.  She is exercising some.     ALLERGIES:   Allergies  Allergen Reactions  . Buprenorphine Hcl Nausea And Vomiting  . Codeine Nausea  And Vomiting  . Morphine And Related Nausea And Vomiting    CURRENT MEDICATIONS:   Current Outpatient Medications:  .  aspirin EC 81 MG tablet, Take 81 mg by mouth at bedtime., Disp: , Rfl:  .  calcium citrate-vitamin D (CITRACAL+D) 315-200 MG-UNIT tablet, Take 2 tablets by mouth at bedtime., Disp: , Rfl:  .  Cholecalciferol (VITAMIN D3) 2000 units capsule, Take 1 capsule by mouth at bedtime., Disp: , Rfl:  .  escitalopram (LEXAPRO) 10 MG tablet, Take 10 mg by mouth daily., Disp: , Rfl:  .  esomeprazole (NEXIUM) 40 MG capsule, Take 40 mg by mouth at bedtime. , Disp: , Rfl:  .  famotidine (PEPCID) 20 MG tablet, Take 20 mg by mouth at bedtime as needed for heartburn or indigestion., Disp: , Rfl:  .  lidocaine (LIDODERM) 5 %, Place 1 patch onto the skin daily. Remove & Discard patch within 12 hours or as directed by MD, Disp: 14 patch, Rfl: 0 .  loratadine (CLARITIN) 10 MG tablet, Take 10 mg by mouth at bedtime., Disp: , Rfl:  .  mirabegron ER (MYRBETRIQ) 50 MG TB24 tablet, Take 50 mg by mouth daily. , Disp: , Rfl:  .  olmesartan (BENICAR) 20 MG tablet, Take 20 mg by mouth daily., Disp: , Rfl:  .  primidone (MYSOLINE) 50 MG tablet, Take 3 tablets (150 mg total) by mouth at bedtime., Disp: 270 tablet, Rfl: 1 .  traMADol (ULTRAM) 50 MG tablet, Take 50 mg by mouth daily as needed., Disp: , Rfl:  .  trimethoprim  (TRIMPEX) 100 MG tablet, Take 100 mg by mouth daily. , Disp: , Rfl:  .  valsartan (DIOVAN) 160 MG tablet, Take 160 mg by mouth daily., Disp: , Rfl:    PAST MEDICAL HISTORY:   Past Medical History:  Diagnosis Date  . 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 Benign familial tremor   . Clostridium difficile infection   . DDD (degenerative disc disease), lumbar   . Diverticulosis   . DVT (deep venous thrombosis) (HCC)    left leg  . Gallstones   . GERD (gastroesophageal reflux disease)   . Hypertension   . Osteoporosis   . Wears glasses     PAST SURGICAL HISTORY:   Past Surgical History:  Procedure Laterality Date  . ABDOMINAL HYSTERECTOMY  1983  . arterial vascular fistula  2004   coiled at [place]  [phone]  . CARPAL TUNNEL RELEASE  right  . CHOLECYSTECTOMY N/A 03/29/2013   Procedure: LAPAROSCOPIC CHOLECYSTECTOMY WITH INTRAOPERATIVE CHOLANGIOGRAM;  Surgeon: Haywood Lasso, MD;  Location: [facility];  Service: General;  Laterality: N/A;  . EUS N/A 03/09/2013   Procedure: UPPER ENDOSCOPIC ULTRASOUND (EUS) LINEAR;  Surgeon: Beryle Beams, MD;  Location: [facility];  Service: Endoscopy;  Laterality: N/A;  . LUMBAR LAMINECTOMY/DECOMPRESSION MICRODISCECTOMY  11/30/2012   Procedure: LUMBAR LAMINECTOMY/DECOMPRESSION MICRODISCECTOMY 2 LEVELS;  Surgeon: Magnus Sinning, MD;  Location: [facility];  Service: Orthopedics;  Laterality: N/A;  DECOMPRESSION LAMINECTOMY L4-L5, L5-S1 CENTRAL   . ROTATOR CUFF REPAIR Bilateral 2004   two on right, one on left  . TRIGGER FINGER RELEASE Right     hand    SOCIAL HISTORY:   Social History   Socioeconomic History  . Marital status: Married    Spouse name: Richard  . Number of children: 2  . Years of education: 35  . Highest education level: Not on file  Social  Needs  . Financial resource strain: Not on file  . Food insecurity - worry: Not on file  . Food insecurity - inability: Not on file  .  Transportation needs - medical: Not on file  . Transportation needs - non-medical: Not on file  Occupational History  . Occupation: Futures trader    Comment: partially retired  Tobacco Use  . Smoking status: Former Smoker    Packs/day: 0.50    Types: Cigarettes    Last attempt to quit: 07/11/1970    Years since quitting: 47.5  . Smokeless tobacco: Never Used  Substance and Sexual Activity  . Alcohol use: Yes    Alcohol/week: 0.0 oz    Comment: wine occasionally  . Drug use: No  . Sexual activity: Not Currently  Other Topics Concern  . Not on file  Social History Narrative   Patient is right handed, resides with spouse in a home.   1 cup of coffee a day     FAMILY HISTORY:   Family Status  Relation Name Status  . Mother  Deceased at age 41       sarcoma  . Father  Deceased       complications from broken hip, ET  . Sister  Deceased       blood clot, ET  . Son  Alive       healthy, [place]  . Daughter  Alive       healthy    ROS:  A complete 10 system review of systems was obtained and was unremarkable apart from what is mentioned above.  PHYSICAL EXAMINATION:    VITALS:   Vitals:   01/31/18 1021  BP: 126/64  Pulse: 84  SpO2: 95%  Weight: 219 lb (99.3 kg)  Height: 4\' 11"  (1.499 m)    GEN:  The patient appears stated age and is in NAD.  Somewhat emotional at times HEENT:  Normocephalic, atraumatic.  The mucous membranes are moist. The superficial temporal arteries are without ropiness or tenderness. CV:  RRR Lungs:  CTAB Neck/HEME:  There are no carotid bruits bilaterally.  Neurological examination:  Orientation: The patient is alert and oriented x3.  Cranial nerves: There is good facial symmetry.  Extraocular muscles are intact. The visual fields are full to confrontational testing. The speech is fluent and clear. Soft palate rises symmetrically and there is no tongue deviation. Hearing is intact to conversational tone. Sensation: Sensation is intact to  light touch.   Vibration is decreased at the bilateral big toe. There is no extinction with double simultaneous stimulation. There is no sensory dermatomal level identified. Motor: Strength is 5/5 in the bilateral upper and lower extremities.   Shoulder shrug is equal and symmetric.  There is no pronator drift.   Movement examination: Tone: There is normal tone in the bilateral upper extremities.  The tone in the lower extremities is normal.  Abnormal movements: There is mild bilateral independent resting tremor.  There is tremor of the outstretched hands that increases with intention bilaterally. Tremor becomes mod with holding a weight in hand (same as previous) Coordination:  There is no significant decremation with RAM's, with any form of rapid alternating movement including alternating supination and pronation of the forearm, hand opening and closing, finger taps, heel taps and toe taps. Gait and Station: The patient has a wide based gait and slightly antalgic with the walker  ASSESSMENT/PLAN:  1.  Essential tremor.  - continue Primidone 50 mg.  She is supposed to  be on 3 tablets at night but she isn't sure if she doing that.  She is to let me know if on 2 or 3 tablets at night.  -Initially, we thought that her falls were related to Inderal LA, 80 mg daily but ultimately decided that they were not.  She has taken herself off of it in the past.  We will restart at a lower dosage, inderal 20 mg bid.    -has evidence of peripheral neuropathy.  Cancelled her EMG in the past. 3.  Hx AVM repair with coiling  -She does have an upgoing toe on the right, which I think is likely just a result of her history of AVM, which she reports was on the left.  I see nothing else focal or lateralizing on her examination today, so decided to hold off on any further neuroimaging.    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 4.  Trigeminal neuralgia  -no longer having sx 4.  OSAS  -On CPAP faithfully but still with some EDS.  I think likely related to depression 5  Anxiety and depression  -still having trouble with adjusting to move to [place].  Doing better than in the past.  Asks several questions about husband/care today  -She had neuropsych testing on 09/22/2016.   As suspected, there was no evidence of dementia.  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  Talked about whether or not to repeat given c/o memory change but I still think that this is just increase in anxiety/depression.  Can reconsider in future. 6.  Follow up is anticipated in the next few months, sooner should new neurologic issues arise.  Much greater than 50% of this visit was spent in counseling and coordinating care.  Total face to face time:  25 min

## 2018-01-31 ENCOUNTER — Ambulatory Visit (INDEPENDENT_AMBULATORY_CARE_PROVIDER_SITE_OTHER): Payer: Medicare Other | Admitting: Neurology

## 2018-01-31 ENCOUNTER — Encounter: Payer: Self-pay | Admitting: Neurology

## 2018-01-31 VITALS — BP 126/64 | HR 84 | Ht 59.0 in | Wt 219.0 lb

## 2018-01-31 DIAGNOSIS — G250 Essential tremor: Secondary | ICD-10-CM | POA: Diagnosis not present

## 2018-01-31 MED ORDER — PROPRANOLOL HCL 20 MG PO TABS: 20.0000 mg | ORAL_TABLET | Freq: Two times a day (BID) | ORAL | 1 refills | Status: DC

## 2018-01-31 NOTE — Patient Instructions (Signed)
Let me know how many primidone you are taking at night Start propranolol 20 mg every 12 hours.  Watch your blood pressure

## 2018-02-01 ENCOUNTER — Telehealth: Payer: Self-pay | Admitting: Neurology

## 2018-02-01 NOTE — Telephone Encounter (Signed)
Patient was told to call back to let us know. Dr. Carles ColletJuluis Rainier.

## 2018-02-01 NOTE — Telephone Encounter (Signed)
Pt called and said she is taking 3 primidone at night

## 2018-02-02 DIAGNOSIS — R350 Frequency of micturition: Secondary | ICD-10-CM | POA: Diagnosis not present

## 2018-02-03 ENCOUNTER — Ambulatory Visit: Payer: Medicare Other | Admitting: Neurology

## 2018-02-08 ENCOUNTER — Telehealth: Payer: Self-pay | Admitting: Neurology

## 2018-02-08 NOTE — Telephone Encounter (Signed)
Pt called and said she is taking the propanolo but has not noticed much of a difference, will call back after a couple days of observing

## 2018-02-08 NOTE — Telephone Encounter (Signed)
Ok.  Thanks.  Is she doing better with the addition of propranolol?  Any SE of that?

## 2018-02-08 NOTE — Telephone Encounter (Signed)
Spoke with patient to check on her. She states tremors seem better but was really confused and didn't remember a new medication being added and wasn't sure if she was taking it. She will check to make sure her son picked it up and put in her pill box and let us know.

## 2018-02-08 NOTE — Telephone Encounter (Signed)
She just told me 30 minutes ago tremors were better. Will await feedback.

## 2018-02-15 NOTE — Telephone Encounter (Signed)
Error

## 2018-02-17 DIAGNOSIS — D1801 Hemangioma of skin and subcutaneous tissue: Secondary | ICD-10-CM | POA: Diagnosis not present

## 2018-02-17 DIAGNOSIS — L820 Inflamed seborrheic keratosis: Secondary | ICD-10-CM | POA: Diagnosis not present

## 2018-02-17 DIAGNOSIS — L814 Other melanin hyperpigmentation: Secondary | ICD-10-CM | POA: Diagnosis not present

## 2018-02-17 DIAGNOSIS — Z85828 Personal history of other malignant neoplasm of skin: Secondary | ICD-10-CM | POA: Diagnosis not present

## 2018-02-17 DIAGNOSIS — D225 Melanocytic nevi of trunk: Secondary | ICD-10-CM | POA: Diagnosis not present

## 2018-02-17 DIAGNOSIS — L659 Nonscarring hair loss, unspecified: Secondary | ICD-10-CM | POA: Diagnosis not present

## 2018-02-17 DIAGNOSIS — L821 Other seborrheic keratosis: Secondary | ICD-10-CM | POA: Diagnosis not present

## 2018-02-17 DIAGNOSIS — L2089 Other atopic dermatitis: Secondary | ICD-10-CM | POA: Diagnosis not present

## 2018-02-24 DIAGNOSIS — R350 Frequency of micturition: Secondary | ICD-10-CM | POA: Diagnosis not present

## 2018-03-17 DIAGNOSIS — R350 Frequency of micturition: Secondary | ICD-10-CM | POA: Diagnosis not present

## 2018-03-23 DIAGNOSIS — R8290 Unspecified abnormal findings in urine: Secondary | ICD-10-CM | POA: Diagnosis not present

## 2018-03-23 DIAGNOSIS — Z6841 Body Mass Index (BMI) 40.0 and over, adult: Secondary | ICD-10-CM | POA: Diagnosis not present

## 2018-03-23 DIAGNOSIS — R410 Disorientation, unspecified: Secondary | ICD-10-CM | POA: Diagnosis not present

## 2018-03-23 DIAGNOSIS — N390 Urinary tract infection, site not specified: Secondary | ICD-10-CM | POA: Diagnosis not present

## 2018-04-04 DIAGNOSIS — M25551 Pain in right hip: Secondary | ICD-10-CM | POA: Diagnosis not present

## 2018-04-04 DIAGNOSIS — M25552 Pain in left hip: Secondary | ICD-10-CM | POA: Diagnosis not present

## 2018-04-05 DIAGNOSIS — Z6841 Body Mass Index (BMI) 40.0 and over, adult: Secondary | ICD-10-CM | POA: Diagnosis not present

## 2018-04-05 DIAGNOSIS — D72829 Elevated white blood cell count, unspecified: Secondary | ICD-10-CM | POA: Diagnosis not present

## 2018-04-05 DIAGNOSIS — R410 Disorientation, unspecified: Secondary | ICD-10-CM | POA: Diagnosis not present

## 2018-04-05 DIAGNOSIS — I10 Essential (primary) hypertension: Secondary | ICD-10-CM | POA: Diagnosis not present

## 2018-04-05 DIAGNOSIS — N390 Urinary tract infection, site not specified: Secondary | ICD-10-CM | POA: Diagnosis not present

## 2018-04-05 DIAGNOSIS — F3289 Other specified depressive episodes: Secondary | ICD-10-CM | POA: Diagnosis not present

## 2018-04-12 ENCOUNTER — Other Ambulatory Visit: Payer: Self-pay | Admitting: Internal Medicine

## 2018-04-12 DIAGNOSIS — Z1231 Encounter for screening mammogram for malignant neoplasm of breast: Secondary | ICD-10-CM

## 2018-04-18 DIAGNOSIS — M546 Pain in thoracic spine: Secondary | ICD-10-CM | POA: Diagnosis not present

## 2018-04-18 DIAGNOSIS — M545 Low back pain: Secondary | ICD-10-CM | POA: Diagnosis not present

## 2018-04-21 DIAGNOSIS — R350 Frequency of micturition: Secondary | ICD-10-CM | POA: Diagnosis not present

## 2018-04-21 DIAGNOSIS — N3020 Other chronic cystitis without hematuria: Secondary | ICD-10-CM | POA: Diagnosis not present

## 2018-04-21 DIAGNOSIS — R2689 Other abnormalities of gait and mobility: Secondary | ICD-10-CM | POA: Diagnosis not present

## 2018-04-21 DIAGNOSIS — N3946 Mixed incontinence: Secondary | ICD-10-CM | POA: Diagnosis not present

## 2018-04-21 DIAGNOSIS — M150 Primary generalized (osteo)arthritis: Secondary | ICD-10-CM | POA: Diagnosis not present

## 2018-04-24 DIAGNOSIS — R2689 Other abnormalities of gait and mobility: Secondary | ICD-10-CM | POA: Diagnosis not present

## 2018-04-24 DIAGNOSIS — M150 Primary generalized (osteo)arthritis: Secondary | ICD-10-CM | POA: Diagnosis not present

## 2018-04-25 DIAGNOSIS — M545 Low back pain: Secondary | ICD-10-CM | POA: Diagnosis not present

## 2018-04-25 DIAGNOSIS — M48062 Spinal stenosis, lumbar region with neurogenic claudication: Secondary | ICD-10-CM | POA: Diagnosis not present

## 2018-04-25 DIAGNOSIS — M25562 Pain in left knee: Secondary | ICD-10-CM | POA: Diagnosis not present

## 2018-04-26 DIAGNOSIS — R2689 Other abnormalities of gait and mobility: Secondary | ICD-10-CM | POA: Diagnosis not present

## 2018-04-26 DIAGNOSIS — M150 Primary generalized (osteo)arthritis: Secondary | ICD-10-CM | POA: Diagnosis not present

## 2018-04-28 DIAGNOSIS — M47816 Spondylosis without myelopathy or radiculopathy, lumbar region: Secondary | ICD-10-CM | POA: Diagnosis not present

## 2018-05-01 DIAGNOSIS — R2689 Other abnormalities of gait and mobility: Secondary | ICD-10-CM | POA: Diagnosis not present

## 2018-05-01 DIAGNOSIS — M150 Primary generalized (osteo)arthritis: Secondary | ICD-10-CM | POA: Diagnosis not present

## 2018-05-03 ENCOUNTER — Ambulatory Visit: Payer: Medicare Other

## 2018-05-04 ENCOUNTER — Other Ambulatory Visit: Payer: Self-pay | Admitting: Neurology

## 2018-05-05 DIAGNOSIS — R2689 Other abnormalities of gait and mobility: Secondary | ICD-10-CM | POA: Diagnosis not present

## 2018-05-05 DIAGNOSIS — M150 Primary generalized (osteo)arthritis: Secondary | ICD-10-CM | POA: Diagnosis not present

## 2018-05-08 DIAGNOSIS — M150 Primary generalized (osteo)arthritis: Secondary | ICD-10-CM | POA: Diagnosis not present

## 2018-05-08 DIAGNOSIS — R2689 Other abnormalities of gait and mobility: Secondary | ICD-10-CM | POA: Diagnosis not present

## 2018-05-10 DIAGNOSIS — M859 Disorder of bone density and structure, unspecified: Secondary | ICD-10-CM | POA: Diagnosis not present

## 2018-05-10 DIAGNOSIS — R82998 Other abnormal findings in urine: Secondary | ICD-10-CM | POA: Diagnosis not present

## 2018-05-10 DIAGNOSIS — I10 Essential (primary) hypertension: Secondary | ICD-10-CM | POA: Diagnosis not present

## 2018-05-10 DIAGNOSIS — R7301 Impaired fasting glucose: Secondary | ICD-10-CM | POA: Diagnosis not present

## 2018-05-17 DIAGNOSIS — Z0000 Encounter for general adult medical examination without abnormal findings: Secondary | ICD-10-CM | POA: Diagnosis not present

## 2018-05-17 DIAGNOSIS — I10 Essential (primary) hypertension: Secondary | ICD-10-CM | POA: Diagnosis not present

## 2018-05-17 DIAGNOSIS — Z6841 Body Mass Index (BMI) 40.0 and over, adult: Secondary | ICD-10-CM | POA: Diagnosis not present

## 2018-05-17 DIAGNOSIS — R7301 Impaired fasting glucose: Secondary | ICD-10-CM | POA: Diagnosis not present

## 2018-05-17 DIAGNOSIS — R0600 Dyspnea, unspecified: Secondary | ICD-10-CM | POA: Diagnosis not present

## 2018-05-17 DIAGNOSIS — R269 Unspecified abnormalities of gait and mobility: Secondary | ICD-10-CM | POA: Diagnosis not present

## 2018-05-17 DIAGNOSIS — N3281 Overactive bladder: Secondary | ICD-10-CM | POA: Diagnosis not present

## 2018-05-17 DIAGNOSIS — R911 Solitary pulmonary nodule: Secondary | ICD-10-CM | POA: Diagnosis not present

## 2018-05-17 DIAGNOSIS — M8580 Other specified disorders of bone density and structure, unspecified site: Secondary | ICD-10-CM | POA: Diagnosis not present

## 2018-05-17 DIAGNOSIS — R259 Unspecified abnormal involuntary movements: Secondary | ICD-10-CM | POA: Diagnosis not present

## 2018-05-17 DIAGNOSIS — G3184 Mild cognitive impairment, so stated: Secondary | ICD-10-CM | POA: Diagnosis not present

## 2018-05-18 ENCOUNTER — Other Ambulatory Visit: Payer: Self-pay

## 2018-05-18 ENCOUNTER — Encounter (HOSPITAL_COMMUNITY): Payer: Self-pay | Admitting: Emergency Medicine

## 2018-05-18 ENCOUNTER — Other Ambulatory Visit (HOSPITAL_COMMUNITY): Payer: Self-pay | Admitting: Respiratory Therapy

## 2018-05-18 ENCOUNTER — Inpatient Hospital Stay (HOSPITAL_COMMUNITY)
Admission: EM | Admit: 2018-05-18 | Discharge: 2018-05-20 | DRG: 176 | Disposition: A | Discharge status: Nursing Home (Includes ICF) | Payer: Medicare Other | Attending: Internal Medicine | Admitting: Internal Medicine

## 2018-05-18 ENCOUNTER — Telehealth (HOSPITAL_COMMUNITY): Payer: Self-pay | Admitting: Internal Medicine

## 2018-05-18 ENCOUNTER — Ambulatory Visit
Admission: RE | Admit: 2018-05-18 | Discharge: 2018-05-18 | Disposition: A | Discharge status: Home / Self Care | Payer: Medicare Other | Source: Ambulatory Visit | Attending: Internal Medicine | Admitting: Internal Medicine

## 2018-05-18 ENCOUNTER — Other Ambulatory Visit: Payer: Self-pay | Admitting: Internal Medicine

## 2018-05-18 DIAGNOSIS — G250 Essential tremor: Secondary | ICD-10-CM | POA: Diagnosis present

## 2018-05-18 DIAGNOSIS — R911 Solitary pulmonary nodule: Secondary | ICD-10-CM | POA: Diagnosis present

## 2018-05-18 DIAGNOSIS — D72829 Elevated white blood cell count, unspecified: Secondary | ICD-10-CM | POA: Diagnosis not present

## 2018-05-18 DIAGNOSIS — R0600 Dyspnea, unspecified: Secondary | ICD-10-CM

## 2018-05-18 DIAGNOSIS — Z6841 Body Mass Index (BMI) 40.0 and over, adult: Secondary | ICD-10-CM

## 2018-05-18 DIAGNOSIS — I2609 Other pulmonary embolism with acute cor pulmonale: Secondary | ICD-10-CM | POA: Diagnosis not present

## 2018-05-18 DIAGNOSIS — Z79899 Other long term (current) drug therapy: Secondary | ICD-10-CM

## 2018-05-18 DIAGNOSIS — K219 Gastro-esophageal reflux disease without esophagitis: Secondary | ICD-10-CM | POA: Diagnosis not present

## 2018-05-18 DIAGNOSIS — I2699 Other pulmonary embolism without acute cor pulmonale: Secondary | ICD-10-CM | POA: Diagnosis present

## 2018-05-18 DIAGNOSIS — I2721 Secondary pulmonary arterial hypertension: Secondary | ICD-10-CM | POA: Diagnosis present

## 2018-05-18 DIAGNOSIS — Z86718 Personal history of other venous thrombosis and embolism: Secondary | ICD-10-CM | POA: Diagnosis not present

## 2018-05-18 DIAGNOSIS — I10 Essential (primary) hypertension: Secondary | ICD-10-CM | POA: Diagnosis not present

## 2018-05-18 DIAGNOSIS — M810 Age-related osteoporosis without current pathological fracture: Secondary | ICD-10-CM | POA: Diagnosis present

## 2018-05-18 DIAGNOSIS — N3020 Other chronic cystitis without hematuria: Secondary | ICD-10-CM | POA: Diagnosis not present

## 2018-05-18 DIAGNOSIS — N3946 Mixed incontinence: Secondary | ICD-10-CM | POA: Diagnosis not present

## 2018-05-18 DIAGNOSIS — F419 Anxiety disorder, unspecified: Secondary | ICD-10-CM | POA: Diagnosis present

## 2018-05-18 DIAGNOSIS — Z87891 Personal history of nicotine dependence: Secondary | ICD-10-CM

## 2018-05-18 DIAGNOSIS — R350 Frequency of micturition: Secondary | ICD-10-CM | POA: Diagnosis not present

## 2018-05-18 LAB — COMPREHENSIVE METABOLIC PANEL
ALT: 25 U/L (ref 14–54)
AST: 15 U/L (ref 15–41)
Albumin: 4.1 g/dL (ref 3.5–5.0)
Alkaline Phosphatase: 61 U/L (ref 38–126)
Anion gap: 10 (ref 5–15)
BILIRUBIN TOTAL: 0.5 mg/dL (ref 0.3–1.2)
BUN: 28 mg/dL — AB (ref 6–20)
CHLORIDE: 99 mmol/L — AB (ref 101–111)
CO2: 26 mmol/L (ref 22–32)
CREATININE: 0.79 mg/dL (ref 0.44–1.00)
Calcium: 8.9 mg/dL (ref 8.9–10.3)
GFR calc Af Amer: >60 mL/min (ref >60)
Glucose, Bld: 89 mg/dL (ref 65–99)
Potassium: 4.2 mmol/L (ref 3.5–5.1)
Sodium: 135 mmol/L (ref 135–145)
TOTAL PROTEIN: 7.3 g/dL (ref 6.5–8.1)

## 2018-05-18 LAB — I-STAT TROPONIN, ED: TROPONIN I, POC: 0.01 ng/mL (ref 0.00–0.08)

## 2018-05-18 LAB — CBC WITH DIFFERENTIAL/PLATELET
Basophils Absolute: 0 10*3/uL (ref 0.0–0.1)
Basophils Relative: 0 %
EOS ABS: 0.3 10*3/uL (ref 0.0–0.7)
EOS PCT: 2 %
HCT: 39.5 % (ref 36.0–46.0)
Hemoglobin: 13.3 g/dL (ref 12.0–15.0)
LYMPHS ABS: 3.5 10*3/uL (ref 0.7–4.0)
Lymphocytes Relative: 26 %
MCH: 31.1 pg (ref 26.0–34.0)
MCHC: 33.7 g/dL (ref 30.0–36.0)
MCV: 92.3 fL (ref 78.0–100.0)
MONO ABS: 1.2 10*3/uL — AB (ref 0.1–1.0)
Monocytes Relative: 9 %
Neutro Abs: 8.3 10*3/uL — ABNORMAL HIGH (ref 1.7–7.7)
Neutrophils Relative %: 63 %
PLATELETS: 289 10*3/uL (ref 150–400)
RBC: 4.28 MIL/uL (ref 3.87–5.11)
RDW: 14 % (ref 11.5–15.5)
WBC: 13.3 10*3/uL — ABNORMAL HIGH (ref 4.0–10.5)

## 2018-05-18 LAB — TSH: TSH: 1.749 u[IU]/mL (ref 0.350–4.500)

## 2018-05-18 LAB — PROTIME-INR
INR: 0.98
Prothrombin Time: 12.9 seconds (ref 11.4–15.2)

## 2018-05-18 LAB — APTT: aPTT: 23 seconds — ABNORMAL LOW (ref 24–36)

## 2018-05-18 MED ORDER — PANTOPRAZOLE SODIUM 40 MG PO TBEC
40.0000 mg | DELAYED_RELEASE_TABLET | Freq: Every day | ORAL | Status: DC
Start: 2018-05-18 — End: 2018-05-20
  Administered 2018-05-18 – 2018-05-20 (×3): 40 mg via ORAL
  Filled 2018-05-18 (×3): qty 1

## 2018-05-18 MED ORDER — MIRABEGRON ER 25 MG PO TB24
50.0000 mg | ORAL_TABLET | Freq: Every day | ORAL | Status: DC
  Administered 2018-05-18 – 2018-05-20 (×3): 50 mg via ORAL
  Filled 2018-05-18 (×3): qty 2

## 2018-05-18 MED ORDER — IOPAMIDOL (ISOVUE-370) INJECTION 76%
100.0000 mL | Freq: Once | INTRAVENOUS | Status: AC | PRN
  Administered 2018-05-18: 100 mL via INTRAVENOUS

## 2018-05-18 MED ORDER — ONDANSETRON HCL 4 MG PO TABS: 4.0000 mg | ORAL_TABLET | Freq: Four times a day (QID) | ORAL | Status: DC | PRN

## 2018-05-18 MED ORDER — PRIMIDONE 50 MG PO TABS
150.0000 mg | ORAL_TABLET | Freq: Every day | ORAL | Status: DC
  Administered 2018-05-18 – 2018-05-19 (×2): 150 mg via ORAL
  Filled 2018-05-18 (×2): qty 3

## 2018-05-18 MED ORDER — IBUPROFEN 200 MG PO TABS
200.0000 mg | ORAL_TABLET | Freq: Four times a day (QID) | ORAL | Status: DC | PRN
  Administered 2018-05-19: 200 mg via ORAL
  Filled 2018-05-18: qty 1

## 2018-05-18 MED ORDER — HEPARIN (PORCINE) IN NACL 100-0.45 UNIT/ML-% IJ SOLN
1150.0000 [IU]/h | INTRAMUSCULAR | Status: DC
  Filled 2018-05-18: qty 250

## 2018-05-18 MED ORDER — ACETAMINOPHEN 650 MG RE SUPP: 650.0000 mg | Freq: Four times a day (QID) | RECTAL | Status: DC | PRN

## 2018-05-18 MED ORDER — SENNOSIDES-DOCUSATE SODIUM 8.6-50 MG PO TABS
1.0000 | ORAL_TABLET | Freq: Every evening | ORAL | Status: DC | PRN
Start: 2018-05-18 — End: 2018-05-20

## 2018-05-18 MED ORDER — IRBESARTAN 150 MG PO TABS
150.0000 mg | ORAL_TABLET | Freq: Every day | ORAL | Status: DC
  Administered 2018-05-19 – 2018-05-20 (×2): 150 mg via ORAL
  Filled 2018-05-18 (×2): qty 1

## 2018-05-18 MED ORDER — ONDANSETRON HCL 4 MG/2ML IJ SOLN: 4.0000 mg | Freq: Four times a day (QID) | INTRAMUSCULAR | Status: DC | PRN

## 2018-05-18 MED ORDER — BUSPIRONE HCL 10 MG PO TABS
10.0000 mg | ORAL_TABLET | Freq: Every day | ORAL | Status: DC
  Administered 2018-05-19 – 2018-05-20 (×2): 10 mg via ORAL
  Filled 2018-05-18 (×2): qty 1

## 2018-05-18 MED ORDER — HEPARIN BOLUS VIA INFUSION
4000.0000 [IU] | Freq: Once | INTRAVENOUS | Status: AC
  Administered 2018-05-18: 4000 [IU] via INTRAVENOUS
  Filled 2018-05-18: qty 4000

## 2018-05-18 MED ORDER — LORATADINE 10 MG PO TABS
10.0000 mg | ORAL_TABLET | Freq: Every day | ORAL | Status: DC
  Administered 2018-05-18 – 2018-05-19 (×2): 10 mg via ORAL
  Filled 2018-05-18 (×2): qty 1

## 2018-05-18 MED ORDER — ACETAMINOPHEN 325 MG PO TABS: 650.0000 mg | ORAL_TABLET | Freq: Four times a day (QID) | ORAL | Status: DC | PRN

## 2018-05-18 MED ORDER — HYDRALAZINE HCL 20 MG/ML IJ SOLN
5.0000 mg | Freq: Four times a day (QID) | INTRAMUSCULAR | Status: DC | PRN
  Filled 2018-05-18: qty 1

## 2018-05-18 MED ORDER — ESCITALOPRAM OXALATE 10 MG PO TABS
10.0000 mg | ORAL_TABLET | Freq: Every day | ORAL | Status: DC
  Administered 2018-05-19 – 2018-05-20 (×2): 10 mg via ORAL
  Filled 2018-05-18 (×2): qty 1

## 2018-05-18 MED ORDER — SACCHAROMYCES BOULARDII 250 MG PO CAPS
250.0000 mg | ORAL_CAPSULE | Freq: Two times a day (BID) | ORAL | Status: DC
  Administered 2018-05-18 – 2018-05-20 (×4): 250 mg via ORAL
  Filled 2018-05-18 (×4): qty 1

## 2018-05-18 MED ORDER — HEPARIN (PORCINE) IN NACL 100-0.45 UNIT/ML-% IJ SOLN
1150.0000 [IU]/h | INTRAMUSCULAR | Status: AC
  Administered 2018-05-18 – 2018-05-20 (×3): 1150 [IU]/h via INTRAVENOUS
  Filled 2018-05-18 (×3): qty 250

## 2018-05-18 MED ORDER — FAMOTIDINE 20 MG PO TABS: 20.0000 mg | ORAL_TABLET | Freq: Every evening | ORAL | Status: DC | PRN

## 2018-05-18 MED ORDER — ASPIRIN EC 81 MG PO TBEC
81.0000 mg | DELAYED_RELEASE_TABLET | Freq: Every day | ORAL | Status: DC
  Administered 2018-05-18 – 2018-05-19 (×2): 81 mg via ORAL
  Filled 2018-05-18 (×2): qty 1

## 2018-05-18 NOTE — ED Triage Notes (Signed)
Pt reports that she saw Dr [name] yesterday for SOB and wheezing and today had CT scan which showed PEs and told to go to ED.

## 2018-05-18 NOTE — ED Notes (Signed)
Bed: WA02 Expected date:  Expected time:  Means of arrival:  Comments: [facility] confirmed PE

## 2018-05-18 NOTE — ED Notes (Signed)
ED TO INPATIENT HANDOFF REPORT  Name/Age/Gender [name] 77 y.o. female  Code Status    Code Status Orders  (From admission, onward)        Start     Ordered   05/18/18 1750  Full code  Continuous     05/18/18 1753    Code Status History    Date Active Date Inactive Code Status Order ID Comments User Context   11/30/2012 1931 12/02/2012 1410 Full Code [id number]  Tawanna Solo, RN Inpatient      Home/SNF/Other Home  Chief Complaint Blood clots in lungs  Level of Care/Admitting Diagnosis ED Disposition    ED Disposition Condition [place]: [facility] [[id number]]  Level of Care: Telemetry [5]  Admit to tele based on following criteria: Other see comments  Comments: PE  Diagnosis: Pulmonary embolism [facility]) [[id number]]  Admitting Physician: Aline August [[id number]]  Attending Physician: Aline August [[id number]]  PT Class (Do Not Modify): Observation [104]  PT Acc Code (Do Not Modify): Observation [10022]       Medical History Past Medical History:  Diagnosis Date  . 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 Benign familial tremor   . Clostridium difficile infection   . DDD (degenerative disc disease), lumbar   . Diverticulosis   . DVT (deep venous thrombosis) (HCC)    left leg  . Gallstones   . GERD (gastroesophageal reflux disease)   . Hypertension   . Osteoporosis   . Wears glasses     Allergies Allergies  Allergen Reactions  . Buprenorphine Hcl Nausea And Vomiting  . Codeine Nausea And Vomiting    Other reaction(s): Other (See Comments) But tolerates Tramadol But tolerates Tramadol Vomiting   . Morphine And Related Nausea And Vomiting  . Morphine     Other reaction(s): Other (See Comments) Vomiting  . Procaine     Other reaction(s): Other (See Comments) Other reaction(s): Shakes Novacaine-Shakes    IV Location/Drains/Wounds Patient Lines/Drains/Airways Status   Active  Line/Drains/Airways    Name:   Placement date:   Placement time:   Site:   Days:   Peripheral IV 05/18/18 Left Antecubital   05/18/18    1623    Antecubital   less than 1   Peripheral IV 05/18/18 Left Hand   05/18/18    1707    Hand   less than 1   Incision 03/29/13 Abdomen Right   03/29/13    1147     1876   Incision - 4 Ports Abdomen 1: Mid;Upper 2: Umbilicus 3: Right;Lower;Lateral 4: Right;Mid   03/29/13    1104     1876          Labs/Imaging Results for orders placed or performed during the hospital encounter of 05/18/18 (from the past 48 hour(s))  Comprehensive metabolic panel     Status: Abnormal   Collection Time: 05/18/18  4:39 PM  Result Value Ref Range   Sodium 135 135 - 145 mmol/L   Potassium 4.2 3.5 - 5.1 mmol/L   Chloride 99 (L) 101 - 111 mmol/L   CO2 26 22 - 32 mmol/L   Glucose, Bld 89 65 - 99 mg/dL   BUN 28 (H) 6 - 20 mg/dL   Creatinine, Ser 0.79 0.44 - 1.00 mg/dL   Calcium 8.9 8.9 - 10.3 mg/dL   Total Protein 7.3 6.5 - 8.1 g/dL   Albumin 4.1 3.5 - 5.0 g/dL  AST 15 15 - 41 U/L   ALT 25 14 - 54 U/L   Alkaline Phosphatase 61 38 - 126 U/L   Total Bilirubin 0.5 0.3 - 1.2 mg/dL   GFR calc non Af Amer >60 >60 mL/min   GFR calc Af Amer >60 >60 mL/min    Comment: (NOTE) The eGFR has been calculated using the CKD EPI equation. This calculation has not been validated in all clinical situations. eGFR's persistently <60 mL/min signify possible Chronic Kidney Disease.    Anion gap 10 5 - 15    Comment: Performed at [facility], [place] [address] [place], [place] [address]  CBC with Differential     Status: Abnormal   Collection Time: 05/18/18  4:39 PM  Result Value Ref Range   WBC 13.3 (H) 4.0 - 10.5 K/uL   RBC 4.28 3.87 - 5.11 MIL/uL   Hemoglobin 13.3 12.0 - 15.0 g/dL   HCT 39.5 36.0 - 46.0 %   MCV 92.3 78.0 - 100.0 fL   MCH 31.1 26.0 - 34.0 pg   MCHC 33.7 30.0 - 36.0 g/dL   RDW 14.0 11.5 - 15.5 %   Platelets 289 150 - 400 K/uL   Neutrophils  Relative % 63 %   Neutro Abs 8.3 (H) 1.7 - 7.7 K/uL   Lymphocytes Relative 26 %   Lymphs Abs 3.5 0.7 - 4.0 K/uL   Monocytes Relative 9 %   Monocytes Absolute 1.2 (H) 0.1 - 1.0 K/uL   Eosinophils Relative 2 %   Eosinophils Absolute 0.3 0.0 - 0.7 K/uL   Basophils Relative 0 %   Basophils Absolute 0.0 0.0 - 0.1 K/uL    Comment: Performed at [facility], [place] [address], [place], [place] [address]  Protime-INR     Status: None   Collection Time: 05/18/18  4:39 PM  Result Value Ref Range   Prothrombin Time 12.9 11.4 - 15.2 seconds   INR 0.98     Comment: Performed at [facility], [place] [address], [place], [place] [address]  APTT     Status: Abnormal   Collection Time: 05/18/18  4:39 PM  Result Value Ref Range   aPTT 23 (L) 24 - 36 seconds    Comment: Performed at [facility], [place] [address] [place], [place] [address]  I-stat troponin, ED     Status: None   Collection Time: 05/18/18  4:41 PM  Result Value Ref Range   Troponin i, poc 0.01 0.00 - 0.08 ng/mL   Comment 3            Comment: Due to the release kinetics of cTnI, a negative result within the first hours of the onset of symptoms does not rule out myocardial infarction with certainty. If myocardial infarction is still suspected, repeat the test at appropriate intervals.    Ct Angio Chest Pe W Or Wo Contrast  Result Date: 05/18/2018 CLINICAL DATA:  Dyspnea for 4 weeks. EXAM: CT ANGIOGRAPHY CHEST WITH CONTRAST TECHNIQUE: Multidetector CT imaging of the chest was performed using the standard protocol during bolus administration of intravenous contrast. Multiplanar CT image reconstructions and MIPs were obtained to evaluate the vascular anatomy. CONTRAST:  100 cc Isovue 370 IV. COMPARISON:  12/02/2017 chest CT angiogram. FINDINGS: Cardiovascular: The study is high quality for the evaluation of pulmonary embolism. Acute pulmonary emboli are present at the bifurcation of the  left pulmonary artery extending into the lobar, segmental and subsegmental pulmonary artery branches  of the left upper and left lower lobes. Acute lobar and segmental pulmonary emboli are present in the right upper, right middle and right lower lobes. No saddle pulmonary embolus. Atherosclerotic thoracic aorta with stable ectatic 4.2 cm ascending thoracic aorta. Dilated main pulmonary artery (3.5 cm diameter), stable. Normal heart size. No significant pericardial fluid/thickening. Elevated RV/LV ratio 1.4. Mediastinum/Nodes: No discrete thyroid nodules. Unremarkable esophagus. No pathologically enlarged axillary, mediastinal or hilar lymph nodes. Lungs/Pleura: No pneumothorax. No pleural effusion. Left upper lobe solid 4 mm pulmonary nodule (series 12/image 19), stable since 12/02/2017, probably benign. No acute consolidative airspace disease, lung masses or new significant pulmonary nodules. Upper abdomen: Cholecystectomy. Musculoskeletal: No aggressive appearing focal osseous lesions. Moderate thoracic spondylosis. Soft tissue anchors are noted in the right humeral head. Review of the MIP images confirms the above findings. IMPRESSION: 1. Acute large clot burden bilateral pulmonary embolism involving the bifurcation of the left pulmonary artery and the lobar, segmental and subsegmental pulmonary artery branches of all lung lobes. 2. Positive for acute PE with CT evidence of right heart strain (RV/LV Ratio = 1.4) consistent with at least submassive (intermediate risk) PE. The presence of right heart strain has been associated with an increased risk of morbidity and mortality. 3. Dilated main pulmonary artery, stable since 12/02/2017, compatible with chronic pulmonary arterial hypertension. 4. No acute pulmonary disease. Solitary 4 mm left upper lobe pulmonary nodule is stable since 12/02/2017 chest CT, probably benign. No follow-up needed if patient is low-risk. Non-contrast chest CT can be considered in 12 months  if patient is high-risk. This recommendation follows the consensus statement: Guidelines for Management of Incidental Pulmonary Nodules Detected on CT Images: From the Fleischner Society 2017; Radiology 2017; 284:228-243. Aortic Atherosclerosis (ICD10-I70.0). Critical Value/emergent results were called by telephone at the time of interpretation on 05/18/2018 at 3:18 pm to Dr. [name] , who verbally acknowledged these results. Electronically Signed   By: Ilona Sorrel M.D.   On: 05/18/2018 15:23    Pending Labs Unresulted Labs (From admission, onward)   Start     Ordered   05/19/18 0500  CBC  Tomorrow morning,   R     05/18/18 1725   05/19/18 0500  Comprehensive metabolic panel  Tomorrow morning,   R     05/18/18 1753   05/19/18 0500  APTT  Tomorrow morning,   R     05/18/18 1753   05/19/18 0100  Heparin level (unfractionated)  Once-Timed,   R     05/18/18 1728   05/18/18 1751  TSH  Once,   R     05/18/18 1753      Vitals/Pain Today's Vitals   05/18/18 1800 05/18/18 1900 05/18/18 1915 05/18/18 1930  BP: (!) 203/87 (!) 187/125 (!) 158/82 (!) 152/71  Pulse: 69 74 72 73  Resp: _0 Temp:      TempSrc:      SpO2: 92% 95% 96% 93%  Weight:      PainSc:        Isolation Precautions No active isolations  Medications Medications  heparin ADULT infusion 100 units/mL (25000 units/258m sodium chloride 0.45%) (1,150 Units/hr Intravenous New Bag/Given 05/18/18 1651)  aspirin EC tablet 81 mg (has no administration in time range)  busPIRone (BUSPAR) tablet 10 mg (has no administration in time range)  escitalopram (LEXAPRO) tablet 10 mg (has no administration in time range)  pantoprazole (PROTONIX) EC tablet 40 mg (has no administration in time range)  famotidine (PEPCID) tablet  20 mg (has no administration in time range)  ibuprofen (ADVIL,MOTRIN) tablet 200 mg (has no administration in time range)  loratadine (CLARITIN) tablet 10 mg (has no administration in time range)   mirabegron ER (MYRBETRIQ) tablet 50 mg (has no administration in time range)  irbesartan (AVAPRO) tablet 150 mg (has no administration in time range)  saccharomyces boulardii (FLORASTOR) capsule 250 mg (has no administration in time range)  primidone (MYSOLINE) tablet 150 mg (has no administration in time range)  acetaminophen (TYLENOL) tablet 650 mg (has no administration in time range)    Or  acetaminophen (TYLENOL) suppository 650 mg (has no administration in time range)  senna-docusate (Senokot-S) tablet 1 tablet (has no administration in time range)  ondansetron (ZOFRAN) tablet 4 mg (has no administration in time range)    Or  ondansetron (ZOFRAN) injection 4 mg (has no administration in time range)  hydrALAZINE (APRESOLINE) injection 5 mg (has no administration in time range)  heparin bolus via infusion 4,000 Units (4,000 Units Intravenous Bolus from Bag 05/18/18 1652)    Mobility walks

## 2018-05-18 NOTE — Plan of Care (Signed)
  Problem: Clinical Measurements: Goal: Respiratory complications will improve Outcome: Progressing Goal: Cardiovascular complication will be avoided Outcome: Progressing   Problem: Activity: Goal: Risk for activity intolerance will decrease Outcome: Progressing   Problem: Nutrition: Goal: Adequate nutrition will be maintained Outcome: Progressing   Problem: Coping: Goal: Level of anxiety will decrease Outcome: Progressing   Problem: Elimination: Goal: Will not experience complications related to bowel motility Outcome: Progressing Goal: Will not experience complications related to urinary retention Outcome: Progressing   Problem: Pain Managment: Goal: General experience of comfort will improve Outcome: Progressing   Problem: Safety: Goal: Ability to remain free from injury will improve Outcome: Progressing   Problem: Skin Integrity: Goal: Risk for impaired skin integrity will decrease Outcome: Progressing   

## 2018-05-18 NOTE — H&P (Signed)
History and Physical    [name]:[id number] DOB: Sep 18, 1941 DOA: 05/18/2018  PCP: [name], MD   Patient coming from: Home  I have personally briefly reviewed patient's old medical records in [place]  Chief Complaint: Shortness of breath  HPI: [name] is a 77 y.o. 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 difficile colitis, GERD was sent to ED by her primary care provider because of blood clot in her lungs.  Patient had seen her primary care provider yesterday for worsening shortness of breath.  Patient states that she has been having worsening shortness of breath for the last 3 weeks, progressively worsening, exacerbated by minimal exertion, not associated with cough.  She also has had intermittent chest pains.  She feels that her legs are mildly swollen.  No decreased urine output.  No recent travel or major surgery.  She was seen by her primary care provider yesterday who had ordered a CT of the chest which was done today which showed pulmonary embolism and she was referred to the emergency room.  ED Course: She was placed on heparin drip.  Hospitalist service was called to evaluate the patient.  Review of Systems: As per HPI otherwise 10 point review of systems negative.    Past Medical History:  Diagnosis Date  . 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 Benign familial tremor   . Clostridium difficile infection   . DDD (degenerative disc disease), lumbar   . Diverticulosis   . DVT (deep venous thrombosis) (HCC)    left leg  . Gallstones   . GERD (gastroesophageal reflux disease)   . Hypertension   . Osteoporosis   . Wears glasses     Past Surgical History:  Procedure Laterality Date  . ABDOMINAL HYSTERECTOMY  1983  . arterial vascular fistula  2004   coiled at [place]  [phone]  . CARPAL TUNNEL RELEASE  right  . CHOLECYSTECTOMY N/A 03/29/2013   Procedure: LAPAROSCOPIC CHOLECYSTECTOMY WITH INTRAOPERATIVE CHOLANGIOGRAM;  Surgeon: Haywood Lasso, MD;  Location: [facility];  Service: General;  Laterality: N/A;  . EUS N/A 03/09/2013   Procedure: UPPER ENDOSCOPIC ULTRASOUND (EUS) LINEAR;  Surgeon: Beryle Beams, MD;  Location: [facility];  Service: Endoscopy;  Laterality: N/A;  . LUMBAR LAMINECTOMY/DECOMPRESSION MICRODISCECTOMY  11/30/2012   Procedure: LUMBAR LAMINECTOMY/DECOMPRESSION MICRODISCECTOMY 2 LEVELS;  Surgeon: Magnus Sinning, MD;  Location: [facility];  Service: Orthopedics;  Laterality: N/A;  DECOMPRESSION LAMINECTOMY L4-L5, L5-S1 CENTRAL   . ROTATOR CUFF REPAIR Bilateral 2004   two on right, one on left  . TRIGGER FINGER RELEASE Right     hand   Social history  reports that she quit smoking about 47 years ago. Her smoking use included cigarettes. She smoked 0.50 packs per day. She has never used smokeless tobacco. She reports that she drinks alcohol. She reports that she does not use drugs.  Lives at a retirement community home  Allergies  Allergen Reactions  . Buprenorphine Hcl Nausea And Vomiting  . Codeine Nausea And Vomiting    Other reaction(s): Other (See Comments) But tolerates Tramadol But tolerates Tramadol Vomiting   . Morphine And Related Nausea And Vomiting  . Morphine     Other reaction(s): Other (See Comments) Vomiting  . Procaine  Other reaction(s): Other (See Comments) Other reaction(s): Shakes Novacaine-Shakes    Family History  Problem Relation Age of Onset  . Other Mother        sarcoma  . Other Father        broken hip  . Dementia Father   . Pneumonia Father   . Clotting disorder Sister     Prior to Admission medications   Medication Sig Start Date End Date Taking? Authorizing Provider  aspirin EC 81 MG tablet Take 81 mg by mouth at bedtime.   Yes [provider]    busPIRone (BUSPAR) 10 MG tablet Take 10 mg by mouth daily. 05/17/18  Yes [provider]  calcium citrate-vitamin D (CITRACAL+D) 315-200 MG-UNIT tablet Take 2 tablets by mouth at bedtime.   Yes [provider]  Cholecalciferol (VITAMIN D3) 2000 units capsule Take 1 capsule by mouth at bedtime.   Yes [provider]  escitalopram (LEXAPRO) 10 MG tablet Take 10 mg by mouth daily. 02/26/16  Yes [provider]  esomeprazole (NEXIUM) 40 MG capsule Take 40 mg by mouth at bedtime.  10/01/15  Yes [provider]  famotidine (PEPCID) 20 MG tablet Take 20 mg by mouth at bedtime as needed for heartburn or indigestion.   Yes [provider]  ibuprofen (ADVIL,MOTRIN) 200 MG tablet Take 200 mg by mouth every 6 (six) hours as needed.   Yes [provider]  lidocaine (LIDODERM) 5 % Place 1 patch onto the skin daily. Remove & Discard patch within 12 hours or as directed by MD 12/02/17  Yes Palumbo, April, MD  loratadine (CLARITIN) 10 MG tablet Take 10 mg by mouth at bedtime.   Yes [provider]  mirabegron ER (MYRBETRIQ) 50 MG TB24 tablet Take 50 mg by mouth daily.  02/13/16  Yes [provider]  olmesartan (BENICAR) 20 MG tablet Take 20 mg by mouth daily.   Yes [provider]  primidone (MYSOLINE) 50 MG tablet TAKE THREE TABLETS BY MOUTH AT BEDTIME 05/04/18  Yes Tat, Eustace Quail, DO  saccharomyces boulardii (FLORASTOR) 250 MG capsule Take 250 mg by mouth 2 (two) times daily.   Yes [provider]  traMADol (ULTRAM) 50 MG tablet Take 50 mg by mouth daily as needed. 12/19/15  Yes [provider]  trimethoprim (TRIMPEX) 100 MG tablet Take 100 mg by mouth daily.  03/19/15  Yes [provider]  propranolol (INDERAL) 20 MG tablet Take 1 tablet (20 mg total) by mouth 2 (two) times daily. 01/31/18   Ludwig Clarks, DO    Physical Exam: Vitals:   05/18/18 1608 05/18/18 1611 05/18/18 1623  BP: (!) 179/107     Pulse: 75    Resp: 20    Temp:  98.6 F (37 C)   TempSrc:  Oral   SpO2: 95%    Weight:   98.4 kg (217 lb)    Constitutional: NAD, calm, comfortable Vitals:   05/18/18 1608 05/18/18 1611 05/18/18 1623  BP: (!) 179/107    Pulse: 75    Resp: 20    Temp:  98.6 F (37 C)   TempSrc:  Oral   SpO2: 95%    Weight:   98.4 kg (217 lb)   Eyes: PERRL, lids and conjunctivae normal ENMT: Mucous membranes are moist. Posterior pharynx clear of any exudate or lesions.  Neck: normal, supple, no masses, no thyromegaly Respiratory: Bilateral decreased breath sounds at bases with no wheezing or crackles.  No accessory muscle use  cardiovascular: S1-S2 positive, rate controlled.  No murmurs.  Trace lower extremity edema  abdomen: Morbidly obese, no tenderness, no masses palpated. No hepatosplenomegaly. Bowel sounds positive.  Musculoskeletal: no clubbing / cyanosis. No joint deformity upper and lower extremities.  Skin: no rashes, lesions, ulcers. No induration Neurologic: CN 2-12 grossly intact.  Moving extremities.  No focal neurological deficit. Psychiatric: Normal judgment and insight. Alert and oriented x 3. Normal mood.    Labs on Admission: I have personally reviewed following labs and imaging studies  CBC: Recent Labs  Lab 05/18/18 1639  WBC 13.3*  NEUTROABS 8.3*  HGB 13.3  HCT 39.5  MCV 92.3  PLT 389   Basic Metabolic Panel: Recent Labs  Lab 05/18/18 1639  NA 135  K 4.2  CL 99*  CO2 26  GLUCOSE 89  BUN 28*  CREATININE 0.79  CALCIUM 8.9   GFR: Estimated Creatinine Clearance: 58.8 mL/min (by C-G formula based on SCr of 0.79 mg/dL). Liver Function Tests: Recent Labs  Lab 05/18/18 1639  AST 15  ALT 25  ALKPHOS 61  BILITOT 0.5  PROT 7.3  ALBUMIN 4.1   No results for input(s): LIPASE, AMYLASE in the last 168 hours. No results for input(s): AMMONIA in the last 168 hours. Coagulation Profile: Recent Labs  Lab 05/18/18 1639  INR 0.98   Cardiac Enzymes: No  results for input(s): CKTOTAL, CKMB, CKMBINDEX, TROPONINI in the last 168 hours. BNP (last 3 results) No results for input(s): PROBNP in the last 8760 hours. HbA1C: No results for input(s): HGBA1C in the last 72 hours. CBG: No results for input(s): GLUCAP in the last 168 hours. Lipid Profile: No results for input(s): CHOL, HDL, LDLCALC, TRIG, CHOLHDL, LDLDIRECT in the last 72 hours. Thyroid Function Tests: No results for input(s): TSH, T4TOTAL, FREET4, T3FREE, THYROIDAB in the last 72 hours. Anemia Panel: No results for input(s): VITAMINB12, FOLATE, FERRITIN, TIBC, IRON, RETICCTPCT in the last 72 hours. Urine analysis:    Component Value Date/Time   COLORURINE YELLOW 12/02/2017 0143   APPEARANCEUR CLEAR 12/02/2017 0143   LABSPEC 1.013 12/02/2017 0143   PHURINE 6.0 12/02/2017 0143   GLUCOSEU NEGATIVE 12/02/2017 0143   HGBUR SMALL (A) 12/02/2017 0143   BILIRUBINUR NEGATIVE 12/02/2017 0143   KETONESUR NEGATIVE 12/02/2017 0143   PROTEINUR NEGATIVE 12/02/2017 0143   UROBILINOGEN 0.2 08/24/2015 2040   NITRITE NEGATIVE 12/02/2017 0143   LEUKOCYTESUR TRACE (A) 12/02/2017 0143    Radiological Exams on Admission: Ct Angio Chest Pe W Or Wo Contrast  Result Date: 05/18/2018 CLINICAL DATA:  Dyspnea for 4 weeks. EXAM: CT ANGIOGRAPHY CHEST WITH CONTRAST TECHNIQUE: Multidetector CT imaging of the chest was performed using the standard protocol during bolus administration of intravenous contrast. Multiplanar CT image reconstructions and MIPs were obtained to evaluate the vascular anatomy. CONTRAST:  100 cc Isovue 370 IV. COMPARISON:  12/02/2017 chest CT angiogram. FINDINGS: Cardiovascular: The study is high quality for the evaluation of pulmonary embolism. Acute pulmonary emboli are present at the bifurcation of the left pulmonary artery extending into the lobar, segmental and subsegmental pulmonary artery branches of the left upper and left lower lobes. Acute lobar and segmental pulmonary emboli are  present in the right upper, right middle and right lower lobes. No saddle pulmonary embolus. Atherosclerotic thoracic aorta with stable ectatic 4.2 cm ascending thoracic aorta. Dilated main pulmonary artery (3.5 cm diameter), stable. Normal heart size. No significant pericardial fluid/thickening. Elevated RV/LV ratio 1.4. Mediastinum/Nodes: No discrete thyroid nodules. Unremarkable esophagus. No pathologically enlarged axillary, mediastinal  or hilar lymph nodes. Lungs/Pleura: No pneumothorax. No pleural effusion. Left upper lobe solid 4 mm pulmonary nodule (series 12/image 19), stable since 12/02/2017, probably benign. No acute consolidative airspace disease, lung masses or new significant pulmonary nodules. Upper abdomen: Cholecystectomy. Musculoskeletal: No aggressive appearing focal osseous lesions. Moderate thoracic spondylosis. Soft tissue anchors are noted in the right humeral head. Review of the MIP images confirms the above findings. IMPRESSION: 1. Acute large clot burden bilateral pulmonary embolism involving the bifurcation of the left pulmonary artery and the lobar, segmental and subsegmental pulmonary artery branches of all lung lobes. 2. Positive for acute PE with CT evidence of right heart strain (RV/LV Ratio = 1.4) consistent with at least submassive (intermediate risk) PE. The presence of right heart strain has been associated with an increased risk of morbidity and mortality. 3. Dilated main pulmonary artery, stable since 12/02/2017, compatible with chronic pulmonary arterial hypertension. 4. No acute pulmonary disease. Solitary 4 mm left upper lobe pulmonary nodule is stable since 12/02/2017 chest CT, probably benign. No follow-up needed if patient is low-risk. Non-contrast chest CT can be considered in 12 months if patient is high-risk. This recommendation follows the consensus statement: Guidelines for Management of Incidental Pulmonary Nodules Detected on CT Images: From the Fleischner Society  2017; Radiology 2017; 284:228-243. Aortic Atherosclerosis (ICD10-I70.0). Critical Value/emergent results were called by telephone at the time of interpretation on 05/18/2018 at 3:18 pm to Dr. [name] , who verbally acknowledged these results. Electronically Signed   By: Ilona Sorrel M.D.   On: 05/18/2018 15:23    Assessment/Plan Active Problems:   Essential tremor   Pulmonary embolism (HCC)   HTN (hypertension)   Acute pulmonary embolism with right heart strain -Heparin drip has been started in the ED which will be continued.  Pharmacy will be consulted for the same -Discussed oral options for anticoagulation, patient does not remember what oral anticoagulation she was on in the past.  She would like to avoid Coumadin.  Will probably switch to Eliquis versus Xarelto tomorrow if patient remains stable -2D echo.  Lower extremity ultrasound duplex to rule out DVT -Continue telemetry monitoring.  Leukocytosis -Probably reactive secondary to above.  Repeat a.m. labs  History of lower extremity DVT in the past treated with oral anticoagulation  -plan as above  Hypertension -Currently blood pressure is extremely elevated.  Will resume home antihypertensive and use IV antihypertensive if needed.  Essential tremor/benign familial tremor -Continue primidone  Morbid obesity -Outpatient follow-up.  Counseled about weight loss  DVT prophylaxis: Heparin drip Code Status: Full Family Communication: Spoke to son and daughter at bedside Disposition Plan: Home in 1 to 2 days Consults called: None Admission status: Observation in telemetry  Severity of Illness: The appropriate patient status for this patient is OBSERVATION. Observation status is judged to be reasonable and necessary in order to provide the required intensity of service to ensure the patient's safety. The patient's presenting symptoms, physical exam findings, and initial radiographic and laboratory data in the context of their  medical condition is felt to place them at decreased risk for further clinical deterioration. Furthermore, it is anticipated that the patient will be medically stable for discharge from the hospital within 2 midnights of admission. The following factors support the patient status of observation.   " The patient's presenting symptoms include shortness of breath. " The physical exam findings include elevated blood pressure. " The initial radiographic and laboratory data are acute pulmonary embolism.  Aline August MD [facility] Pager [phone]  If 7PM-7AM, please contact night-coverage [email or web address] Password [facility]  05/18/2018, 5:53 PM

## 2018-05-18 NOTE — ED Notes (Signed)
Tried calling report 2x RNs on floor state its still shift change. RN phone not given to nurse accepting patient, therefore have to call me back

## 2018-05-18 NOTE — Progress Notes (Signed)
ANTICOAGULATION CONSULT NOTE - Initial Consult  Pharmacy Consult for heparin Indication: pulmonary embolus  Allergies  Allergen Reactions  . Buprenorphine Hcl Nausea And Vomiting  . Codeine Nausea And Vomiting    Other reaction(s): Other (See Comments) But tolerates Tramadol But tolerates Tramadol Vomiting   . Morphine And Related Nausea And Vomiting  . Morphine     Other reaction(s): Other (See Comments) Vomiting  . Procaine     Other reaction(s): Other (See Comments) Other reaction(s): Shakes Novacaine-Shakes    Patient Measurements: Weight: 217 lb (98.4 kg) Heparin Dosing Weight: 67 kg  Vital Signs: Temp: 98.6 F (37 C) (06/06 1611) Temp Source: Oral (06/06 1611) BP: 179/107 (06/06 1608) Pulse Rate: 75 (06/06 1608)  Labs: No results for input(s): HGB, HCT, PLT, APTT, LABPROT, INR, HEPARINUNFRC, HEPRLOWMOCWT, CREATININE, CKTOTAL, CKMB, TROPONINI in the last 72 hours.  CrCl cannot be calculated (Patient's most recent lab result is older than the maximum 21 days allowed.).   Medical History: Past Medical History:  Diagnosis Date  . 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 Benign familial tremor   . Clostridium difficile infection   . DDD (degenerative disc disease), lumbar   . Diverticulosis   . DVT (deep venous thrombosis) (HCC)    left leg  . Gallstones   . GERD (gastroesophageal reflux disease)   . Hypertension   . Osteoporosis   . Wears glasses     Assessment: Pharmacy consulted to dose and monitor heparin drip in this 77 year old female. Pt has had dyspnea for several weeks, CT chest today showed large bilateral PE. Patient presented to ED for anticogualtion.  Per med rec and discussion with MD, patient is not on anticoagulation PTA. Baseline labs (APTT, CBC, protime-INR) ordered STAT and are pending.  Goal of Therapy:  Heparin level 0.3-0.7 units/ml Monitor platelets by anticoagulation protocol: Yes   Plan:  Give 4000 units bolus x  1 Start heparin infusion at 1150 units/hr Check anti-Xa level in 8 hours and daily while on heparin Continue to monitor H&H and platelets CBC to be ordered daily  Theodis Shove, PharmD, BCPS Clinical Pharmacist Pager: [phone] 05/18/18 4:45 PM

## 2018-05-18 NOTE — ED Provider Notes (Signed)
[facility] DEPT Provider Note   CSN: [id number] Arrival date & time: 05/18/18  1603     History   Chief Complaint Chief Complaint  Patient presents with  . blood clot in lungs    HPI [name] is a 77 y.o. female.  Pt presents to the ED today with "blood clots in her lungs."  The pt went to her pcp yesterday for SOB.  Her doctor ordered a CT chest which was done today which showed bilateral PE with heart strain.  The pt was called and told to come to the ED immediately.  The pt feels sob, but no cp.  No leg swelling.  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  She has not been on any blood thinners for about 2 years.  She said etiology of DVT unknown.  Her sister died of a massive PE, so she may have a genetic d/o.     Past Medical History:  Diagnosis Date  . 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 Benign familial tremor   . Clostridium difficile infection   . DDD (degenerative disc disease), lumbar   . Diverticulosis   . DVT (deep venous thrombosis) (HCC)    left leg  . Gallstones   . GERD (gastroesophageal reflux disease)   . Hypertension   . Osteoporosis   . Wears glasses     Patient Active Problem List   Diagnosis Date Noted  . Sleep apnea, obstructive 07/29/2014  . Essential tremor 04/24/2014    Past Surgical History:  Procedure Laterality Date  . ABDOMINAL HYSTERECTOMY  1983  . arterial vascular fistula  2004   coiled at [place]  [phone]  . CARPAL TUNNEL RELEASE  right  . CHOLECYSTECTOMY N/A 03/29/2013   Procedure: LAPAROSCOPIC CHOLECYSTECTOMY WITH INTRAOPERATIVE CHOLANGIOGRAM;  Surgeon: Haywood Lasso, MD;  Location: [facility];  Service: General;  Laterality: N/A;  . EUS N/A 03/09/2013   Procedure: UPPER ENDOSCOPIC ULTRASOUND (EUS) LINEAR;  Surgeon: Beryle Beams, MD;  Location: [facility];  Service: Endoscopy;  Laterality: N/A;  . LUMBAR  LAMINECTOMY/DECOMPRESSION MICRODISCECTOMY  11/30/2012   Procedure: LUMBAR LAMINECTOMY/DECOMPRESSION MICRODISCECTOMY 2 LEVELS;  Surgeon: Magnus Sinning, MD;  Location: [facility];  Service: Orthopedics;  Laterality: N/A;  DECOMPRESSION LAMINECTOMY L4-L5, L5-S1 CENTRAL   . ROTATOR CUFF REPAIR Bilateral 2004   two on right, one on left  . TRIGGER FINGER RELEASE Right     hand     OB History   None      Home Medications    Prior to Admission medications   Medication Sig Start Date End Date Taking? Authorizing Provider  aspirin EC 81 MG tablet Take 81 mg by mouth at bedtime.   Yes [provider]  busPIRone (BUSPAR) 10 MG tablet Take 10 mg by mouth daily. 05/17/18  Yes [provider]  calcium citrate-vitamin D (CITRACAL+D) 315-200 MG-UNIT tablet Take 2 tablets by mouth at bedtime.   Yes [provider]  Cholecalciferol (VITAMIN D3) 2000 units capsule Take 1 capsule by mouth at bedtime.   Yes [provider]  escitalopram (LEXAPRO) 10 MG tablet Take 10 mg by mouth daily. 02/26/16  Yes [provider]  esomeprazole (NEXIUM) 40 MG capsule Take 40 mg by mouth at bedtime.  10/01/15  Yes [provider]  famotidine (PEPCID) 20 MG tablet Take 20 mg by mouth  at bedtime as needed for heartburn or indigestion.   Yes [provider]  ibuprofen (ADVIL,MOTRIN) 200 MG tablet Take 200 mg by mouth every 6 (six) hours as needed.   Yes [provider]  lidocaine (LIDODERM) 5 % Place 1 patch onto the skin daily. Remove & Discard patch within 12 hours or as directed by MD 12/02/17  Yes Palumbo, April, MD  loratadine (CLARITIN) 10 MG tablet Take 10 mg by mouth at bedtime.   Yes [provider]  mirabegron ER (MYRBETRIQ) 50 MG TB24 tablet Take 50 mg by mouth daily.  02/13/16  Yes [provider]  olmesartan (BENICAR) 20 MG tablet Take 20 mg by mouth daily.   Yes [provider]  primidone (MYSOLINE) 50 MG tablet TAKE  THREE TABLETS BY MOUTH AT BEDTIME 05/04/18  Yes Tat, Eustace Quail, DO  saccharomyces boulardii (FLORASTOR) 250 MG capsule Take 250 mg by mouth 2 (two) times daily.   Yes [provider]  traMADol (ULTRAM) 50 MG tablet Take 50 mg by mouth daily as needed. 12/19/15  Yes [provider]  trimethoprim (TRIMPEX) 100 MG tablet Take 100 mg by mouth daily.  03/19/15  Yes [provider]  propranolol (INDERAL) 20 MG tablet Take 1 tablet (20 mg total) by mouth 2 (two) times daily. 01/31/18   TatEustace Quail, DO    Family History Family History  Problem Relation Age of Onset  . Other Mother        sarcoma  . Other Father        broken hip  . Dementia Father   . Pneumonia Father   . Clotting disorder Sister     Social History Social History   Tobacco Use  . Smoking status: Former Smoker    Packs/day: 0.50    Types: Cigarettes    Last attempt to quit: 07/11/1970    Years since quitting: 47.8  . Smokeless tobacco: Never Used  Substance Use Topics  . Alcohol use: Yes    Alcohol/week: 0.0 oz    Comment: wine occasionally  . Drug use: No     Allergies   Buprenorphine hcl; Codeine; Morphine and related; Morphine; and Procaine   Review of Systems Review of Systems  Respiratory: Positive for shortness of breath.   All other systems reviewed and are negative.    Physical Exam Updated Vital Signs BP (!) 179/107 (BP Location: Right Arm)   Pulse 75   Temp 98.6 F (37 C) (Oral)   Resp 20   Wt 98.4 kg (217 lb)   SpO2 95%   BMI 43.83 kg/m   Physical Exam  Constitutional: She is oriented to person, place, and time. She appears well-developed and well-nourished.  HENT:  Head: Normocephalic and atraumatic.  Right Ear: External ear normal.  Left Ear: External ear normal.  Nose: Nose normal.  Mouth/Throat: Oropharynx is clear and moist.  Eyes: Pupils are equal, round, and reactive to light. Conjunctivae and EOM are normal.  Neck: Normal range of motion. Neck  supple.  Cardiovascular: Normal rate, regular rhythm, normal heart sounds and intact distal pulses.  Pulmonary/Chest: Breath sounds normal. Tachypnea noted.  Abdominal: Soft. Bowel sounds are normal.  Musculoskeletal: Normal range of motion.  Neurological: She is alert and oriented to person, place, and time.  Skin: Skin is warm. Capillary refill takes less than 2 seconds.  Psychiatric: She has a normal mood and affect. Her behavior is normal. Judgment and thought content normal.  Nursing note and vitals  reviewed.    ED Treatments / Results  Labs (all labs ordered are listed, but only abnormal results are displayed) Labs Reviewed  COMPREHENSIVE METABOLIC PANEL - Abnormal; Notable for the following components:      Result Value   Chloride 99 (*)    BUN 28 (*)    All other components within normal limits  CBC WITH DIFFERENTIAL/PLATELET - Abnormal; Notable for the following components:   WBC 13.3 (*)    Neutro Abs 8.3 (*)    Monocytes Absolute 1.2 (*)    All other components within normal limits  APTT - Abnormal; Notable for the following components:   aPTT 23 (*)    All other components within normal limits  PROTIME-INR  CBC  HEPARIN LEVEL (UNFRACTIONATED)  I-STAT TROPONIN, ED    EKG EKG Interpretation  Date/Time:  Thursday May 18 2018 16:32:47 EDT Ventricular Rate:  70 PR Interval:    QRS Duration: 90 QT Interval:  412 QTC Calculation: 445 R Axis:   -12 Text Interpretation:  Sinus rhythm Borderline prolonged PR interval Borderline T abnormalities, anterior leads No significant change since last tracing Confirmed by Isla Pence [phone]) on 05/18/2018 4:35:50 PM   Radiology Ct Angio Chest Pe W Or Wo Contrast  Result Date: 05/18/2018 CLINICAL DATA:  Dyspnea for 4 weeks. EXAM: CT ANGIOGRAPHY CHEST WITH CONTRAST TECHNIQUE: Multidetector CT imaging of the chest was performed using the standard protocol during bolus administration of intravenous contrast. Multiplanar CT  image reconstructions and MIPs were obtained to evaluate the vascular anatomy. CONTRAST:  100 cc Isovue 370 IV. COMPARISON:  12/02/2017 chest CT angiogram. FINDINGS: Cardiovascular: The study is high quality for the evaluation of pulmonary embolism. Acute pulmonary emboli are present at the bifurcation of the left pulmonary artery extending into the lobar, segmental and subsegmental pulmonary artery branches of the left upper and left lower lobes. Acute lobar and segmental pulmonary emboli are present in the right upper, right middle and right lower lobes. No saddle pulmonary embolus. Atherosclerotic thoracic aorta with stable ectatic 4.2 cm ascending thoracic aorta. Dilated main pulmonary artery (3.5 cm diameter), stable. Normal heart size. No significant pericardial fluid/thickening. Elevated RV/LV ratio 1.4. Mediastinum/Nodes: No discrete thyroid nodules. Unremarkable esophagus. No pathologically enlarged axillary, mediastinal or hilar lymph nodes. Lungs/Pleura: No pneumothorax. No pleural effusion. Left upper lobe solid 4 mm pulmonary nodule (series 12/image 19), stable since 12/02/2017, probably benign. No acute consolidative airspace disease, lung masses or new significant pulmonary nodules. Upper abdomen: Cholecystectomy. Musculoskeletal: No aggressive appearing focal osseous lesions. Moderate thoracic spondylosis. Soft tissue anchors are noted in the right humeral head. Review of the MIP images confirms the above findings. IMPRESSION: 1. Acute large clot burden bilateral pulmonary embolism involving the bifurcation of the left pulmonary artery and the lobar, segmental and subsegmental pulmonary artery branches of all lung lobes. 2. Positive for acute PE with CT evidence of right heart strain (RV/LV Ratio = 1.4) consistent with at least submassive (intermediate risk) PE. The presence of right heart strain has been associated with an increased risk of morbidity and mortality. 3. Dilated main pulmonary artery,  stable since 12/02/2017, compatible with chronic pulmonary arterial hypertension. 4. No acute pulmonary disease. Solitary 4 mm left upper lobe pulmonary nodule is stable since 12/02/2017 chest CT, probably benign. No follow-up needed if patient is low-risk. Non-contrast chest CT can be considered in 12 months if patient is high-risk. This recommendation follows the consensus statement: Guidelines for Management of Incidental Pulmonary Nodules Detected on CT Images: From  the Fleischner Society 2017; Radiology 2017; [phone]. Aortic Atherosclerosis (ICD10-I70.0). Critical Value/emergent results were called by telephone at the time of interpretation on 05/18/2018 at 3:18 pm to Dr. [name] , who verbally acknowledged these results. Electronically Signed   By: Ilona Sorrel M.D.   On: 05/18/2018 15:23    Procedures Procedures (including critical care time)  Medications Ordered in ED Medications  heparin ADULT infusion 100 units/mL (25000 units/222mL sodium chloride 0.45%) (1,150 Units/hr Intravenous New Bag/Given 05/18/18 1651)  heparin bolus via infusion 4,000 Units (4,000 Units Intravenous Bolus from Bag 05/18/18 1652)     Initial Impression / Assessment and Plan / ED Course  I have reviewed the triage vital signs and the nursing notes.  Pertinent labs & imaging results that were available during my care of the patient were reviewed by me and considered in my medical decision making (see chart for details).  CRITICAL CARE Performed by: Isla Pence   Total critical care time: 30 minutes  Critical care time was exclusive of separately billable procedures and treating other patients.  Critical care was necessary to treat or prevent imminent or life-threatening deterioration.  Critical care was time spent personally by me on the following activities: development of treatment plan with patient and/or surrogate as well as nursing, discussions with consultants, evaluation of patient's response  to treatment, examination of patient, obtaining history from patient or surrogate, ordering and performing treatments and interventions, ordering and review of laboratory studies, ordering and review of radiographic studies, pulse oximetry and re-evaluation of patient's condition.    Pt placed on heparin gtt per pharmacy consult.  EKG and troponin ok.  She is not hypotensive, in fact, she is hypertensive.  She is very worried about dying like her sister.  Pt d/w Dr. Tawanna Solo (triad) for admission.  Final Clinical Impressions(s) / ED Diagnoses   Final diagnoses:  Other acute pulmonary embolism with acute cor pulmonale [facility])    ED Discharge Orders    None       Isla Pence, MD 05/18/18 1731

## 2018-05-19 ENCOUNTER — Observation Stay (HOSPITAL_COMMUNITY): Payer: Medicare Other

## 2018-05-19 ENCOUNTER — Observation Stay (HOSPITAL_BASED_OUTPATIENT_CLINIC_OR_DEPARTMENT_OTHER): Payer: Medicare Other

## 2018-05-19 ENCOUNTER — Ambulatory Visit: Payer: Medicare Other

## 2018-05-19 DIAGNOSIS — I824Z1 Acute embolism and thrombosis of unspecified deep veins of right distal lower extremity: Secondary | ICD-10-CM

## 2018-05-19 DIAGNOSIS — M810 Age-related osteoporosis without current pathological fracture: Secondary | ICD-10-CM | POA: Diagnosis present

## 2018-05-19 DIAGNOSIS — Z6841 Body Mass Index (BMI) 40.0 and over, adult: Secondary | ICD-10-CM | POA: Diagnosis not present

## 2018-05-19 DIAGNOSIS — F419 Anxiety disorder, unspecified: Secondary | ICD-10-CM | POA: Diagnosis present

## 2018-05-19 DIAGNOSIS — D72829 Elevated white blood cell count, unspecified: Secondary | ICD-10-CM | POA: Diagnosis present

## 2018-05-19 DIAGNOSIS — I2721 Secondary pulmonary arterial hypertension: Secondary | ICD-10-CM | POA: Diagnosis present

## 2018-05-19 DIAGNOSIS — I2699 Other pulmonary embolism without acute cor pulmonale: Secondary | ICD-10-CM | POA: Diagnosis not present

## 2018-05-19 DIAGNOSIS — K219 Gastro-esophageal reflux disease without esophagitis: Secondary | ICD-10-CM | POA: Diagnosis present

## 2018-05-19 DIAGNOSIS — I2609 Other pulmonary embolism with acute cor pulmonale: Secondary | ICD-10-CM | POA: Diagnosis present

## 2018-05-19 DIAGNOSIS — I10 Essential (primary) hypertension: Secondary | ICD-10-CM

## 2018-05-19 DIAGNOSIS — R911 Solitary pulmonary nodule: Secondary | ICD-10-CM | POA: Diagnosis present

## 2018-05-19 DIAGNOSIS — Z86718 Personal history of other venous thrombosis and embolism: Secondary | ICD-10-CM

## 2018-05-19 DIAGNOSIS — G250 Essential tremor: Secondary | ICD-10-CM

## 2018-05-19 DIAGNOSIS — Z87891 Personal history of nicotine dependence: Secondary | ICD-10-CM | POA: Diagnosis not present

## 2018-05-19 DIAGNOSIS — Z79899 Other long term (current) drug therapy: Secondary | ICD-10-CM | POA: Diagnosis not present

## 2018-05-19 LAB — COMPREHENSIVE METABOLIC PANEL
ALT: 21 U/L (ref 14–54)
AST: 11 U/L — AB (ref 15–41)
Albumin: 3.7 g/dL (ref 3.5–5.0)
Alkaline Phosphatase: 57 U/L (ref 38–126)
Anion gap: 9 (ref 5–15)
BILIRUBIN TOTAL: 0.7 mg/dL (ref 0.3–1.2)
BUN: 24 mg/dL — AB (ref 6–20)
CO2: 25 mmol/L (ref 22–32)
Calcium: 8.5 mg/dL — ABNORMAL LOW (ref 8.9–10.3)
Chloride: 103 mmol/L (ref 101–111)
Creatinine, Ser: 0.76 mg/dL (ref 0.44–1.00)
GFR calc Af Amer: >60 mL/min (ref >60)
GFR calc non Af Amer: >60 mL/min (ref >60)
GLUCOSE: 90 mg/dL (ref 65–99)
POTASSIUM: 3.8 mmol/L (ref 3.5–5.1)
Sodium: 137 mmol/L (ref 135–145)
TOTAL PROTEIN: 6.3 g/dL — AB (ref 6.5–8.1)

## 2018-05-19 LAB — CBC
HEMATOCRIT: 37.2 % (ref 36.0–46.0)
Hemoglobin: 12.5 g/dL (ref 12.0–15.0)
MCH: 31.3 pg (ref 26.0–34.0)
MCHC: 33.6 g/dL (ref 30.0–36.0)
MCV: 93 fL (ref 78.0–100.0)
Platelets: 243 10*3/uL (ref 150–400)
RBC: 4 MIL/uL (ref 3.87–5.11)
RDW: 13.9 % (ref 11.5–15.5)
WBC: 12.4 10*3/uL — ABNORMAL HIGH (ref 4.0–10.5)

## 2018-05-19 LAB — HEPARIN LEVEL (UNFRACTIONATED)
HEPARIN UNFRACTIONATED: 0.49 [IU]/mL (ref 0.30–0.70)
Heparin Unfractionated: 0.57 IU/mL (ref 0.30–0.70)

## 2018-05-19 LAB — TROPONIN I: Troponin I: <0.03 ng/mL (ref <0.03)

## 2018-05-19 LAB — APTT: APTT: 69 s — AB (ref 24–36)

## 2018-05-19 NOTE — Progress Notes (Signed)
  Echocardiogram 2D Echocardiogram has been performed.  [name] 05/19/2018, 10:09 AM

## 2018-05-19 NOTE — Telephone Encounter (Signed)
05/18/2018 10:46 AM Phone (Outgoing) Celese, Banner B (Self) [phone] (H)   Left Message - Called pt and lmsg for her to CB to sch an echo.Marland KitchenRG

## 2018-05-19 NOTE — Progress Notes (Signed)
    Pt's Co Pay for Xarelto:   # 2.  S/W DIANE @ [employer] RX # [phone]    1. XARELTO 15 MG BID  COVER-YES  CO-PAY- $ 40.00  TIER- NO  PRIOR APPROVAL- NO    2. XARELTO 20 MG DAILY  COVER- YES  CO-PAY- $ 40.00  TIER- NO  PRIOR APPROVAL- NO    ELIQUIS 10 MG - NONE FORMULARY   3. ELIQUIS 2.5 MG BID  COVER- YES  CO-PAY- $ 40.00  TIER- NO]  PRIOR APPROVAL- NO    4. ELIQUIS 5 MG BID  COVER- YES  CO-PAY- $ 40.00  TIER- NO  PRIOR APPROVAL- NO   PREFERRED PHARMACY : [facility]

## 2018-05-19 NOTE — Care Management Note (Signed)
Case Management Note  Patient Details  Name: [name] MRN: [id number] Date of Birth: 05-19-1941  Subjective/Objective:  Pulmonary Embolism                  Action/Plan:  Plan to discharge back to [facility] with [facility].    Expected Discharge Date:                  Expected Discharge Plan:  Assisted Living / Rest Home([facility])  In-House Referral:     Discharge planning Services  CM Consult  Post Acute Care Choice:    Choice offered to:  Patient  DME Arranged:    DME Agency:     HH Arranged:  RN, PT [place] Agency:  [facility] (now [facility])  Status of Service:  In process, will continue to follow  If discussed at Long Length of Stay Meetings, dates discussed:    Additional CommentsPurcell Mouton, RN 05/19/2018, 3:28 PM

## 2018-05-19 NOTE — Progress Notes (Signed)
PROGRESS NOTE    [name]  QIH:[id number] DOB: 02-Aug-1941 DOA: 05/18/2018 PCP: Shirley Redwood, MD  Brief Narrative:  [name] is a 77 y.o. 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 difficile colitis, GERD was sent to ED by her primary care provider because of blood clot in her lungs.    Patient had seen her primary care provider yesterday for worsening shortness of breath.  Patient states that she has been having worsening shortness of breath for the last 3 weeks, progressively worsening, exacerbated by minimal exertion, not associated with cough.  She also has had intermittent chest pains.  She feels that her legs are mildly swollen.  No decreased urine output.  No recent travel or major surgery.  She was seen by her primary care provider yesterday who had ordered a CT of the chest which was done today which showed pulmonary embolism and she was referred to the emergency room. She was placed on heparin drip.  Hospitalist service was called to evaluate the patient.   She was found to have a blood Clot in Right Leg as well and currently being worked up. Echocardiogram done and pending read.   Assessment & Plan:   Active Problems:   Essential tremor   Pulmonary embolism (HCC)   HTN (hypertension)   Acute pulmonary embolism with right heart strain -CTA of Chest showed Acute large clot burden bilateral pulmonary embolism involving the bifurcation of the left pulmonary artery and the lobar, segmental and subsegmental pulmonary artery branches of all lung lobes. Positive for acute PE with CT evidence of right heart strain (RV/LV Ratio = 1.4) consistent with at least submassive (intermediate risk) PE. The presence of right heart strain has been associated with an increased risk of morbidity and mortality. Dilated main pulmonary  artery, stable since 12/02/2017, compatible with chronic pulmonary arterial hypertension.  No acute pulmonary disease.  -Heparin drip has been started in the ED which will be continued.  Pharmacy will be consulted for the same -Patient was on Apixaban previously and will change  -Transthoracic ECHOCardiogram still pending.   -Lower extremity ultrasound duplex to rule out DVT showed: Ultrasound is unable to distinguish whether obstruction in the Peroneal veins is acute or chronic. Left: There is no evidence of deep vein thrombosis in the lower extremity. However, portions of this examination were limited- see technologist comments above -Troponin I being cycled and Negative so far.  -Continue telemetry monitoring. -Ambulatory Screen done and patient did not desaturate -Follow up ECHO when read and change to NOAC in AM  -Will give Free 30 Day card for NOAC and Pharmacy to educate prior to D/C -Anticipate D/C in AM -PESI Class Low  Leukocytosis -Probably reactive secondary to above and improving -WBC went from 13.3 -> 12.4 -Continue to monitor for signs and symptoms of infection -Repeat CBC in the a.m.  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 Other veins WNL.  A 1.51X1.08X0.73cm fluid collection noted at right popliteal fossa medial portion.  Limited visualization of left calf veins, study could not completely exclude left calf veins thrombosis.; Dr. Oneida Alar cannot distinguish if DVT is acute or Chronic and no definitive thrombus in Left Leg   Hypertension -Blood pressure is extremely elevated on  Admission.  -Resumed Home Antihypertensive Medications with irbesartan 150 mg p.o. Daily -Continue with IV hydralazine 5 mg every 6 hours as needed for systolic blood pressure greater than 326 or diastolic blood pressure greater than 100  Essential tremor/benign familial  tremor -Continue Primidone 150 mg p.o. nightly  Morbid Obesity -Outpatient follow-up.   -Weight Loss Counseling given  Left Upper Lobe Pulmonary Nodule -Noted to be Solitary 4 mm in Left Upper Lobe -follow up with Non-Contrast Chest CT in 12 months  GERD -Continue with famotidine 10 mg p.o. nightly as needed for heartburn and indigestion and continue pantoprazole 40 mg p.o. daily  History of C. difficile infection -Continue Florastor 250 mils p.o. twice daily  DVT prophylaxis: Anticoagulated with Heparin gtt Code Status: FULL CODE Family Communication: Discussed with Son at bedside Disposition Plan: Anticipate D/C Home in Next 24 hours  Consultants:   None   Procedures:  ECHOCARDIOGRAM done and Pending Read   LE VENOUS DUPLEX Preliminary notes--Bilateral lower extremities venous duplex exam completed.  Positive  for DVT involving right Peroneal veins. Other veins WNL.    A 1.51X1.08X0.73cm fluid collection noted at right popliteal fossa medial portion.  Limited visualization of left calf veins, study could not completely exclude left calf veins thrombosis.   Final Interpretation: Right: Ultrasound is unable to distinguish whether obstruction in the Peroneal veins is acute or chronic. Left: There is no evidence of deep vein thrombosis in the lower extremity. However, portions of this examination were limited- see technologist comments above.   Antimicrobials:  Anti-infectives (From admission, onward)   None     Subjective: And examined and states she is doing okay.  Denied any shortness of breath, chest pain, nausea, vomiting.  And later the halls without desaturating.  No other complaints or concerns at this time  Objective: Vitals:   05/18/18 1930 05/18/18 2022 05/18/18 2029 05/19/18 0456  BP: (!) 152/71  (!) 164/74 (!) 149/76  Pulse: 73  72 72  Resp: 17  20 20   Temp:   98.3 F (36.8 C) 98 F (36.7 C)  TempSrc:   Oral Oral  SpO2: 93%  (!) 79% 95%  Weight:   97 kg (213 lb 12.8 oz)    Height:  4\' 11"  (1.499 m)      Intake/Output Summary (Last 24 hours) at 05/19/2018 0806 Last data filed at 05/19/2018 0432 Gross per 24 hour  Intake 254.36 ml  Output -  Net 254.36 ml   Filed Weights   05/18/18 1623 05/18/18 2022  Weight: 98.4 kg (217 lb) 97 kg (213 lb 12.8 oz)   Examination: Physical Exam:  Constitutional: WN/WD obese Caucasian female in NAD and appears calm and comfortable Eyes: Lids and conjunctivae normal, sclerae anicteric  ENMT: External Ears, Nose appear normal. Grossly normal hearing.  Neck: Appears normal, supple, no cervical masses, normal ROM, no appreciable thyromegaly; no JVD Respiratory: Diminished to auscultation bilaterally, no wheezing, rales, rhonchi or crackles. Normal respiratory effort and patient is not tachypenic. No accessory muscle use.  Cardiovascular: RRR, no murmurs / rubs / gallops. S1 and S2 auscultated. No extremity edema but Left Leg bigger than right.  Abdomen: Soft, non-tender, Distended due to body habitus. No masses palpated. No appreciable hepatosplenomegaly. Bowel sounds positive.  GU: Deferred. Musculoskeletal: No clubbing / cyanosis of digits/nails. Left Leg bigger than right  Skin: No rashes, lesions, ulcers on a limited skin evaluation. No induration; Warm and dry.  Neurologic: CN 2-12 grossly intact with no focal deficits. Romberg sign  and cerebellar reflexes not assessed.  Psychiatric: Normal judgment and insight. Alert and oriented x 3. Normal mood and appropriate affect.   Data Reviewed: I have personally reviewed following labs and imaging studies  CBC: Recent Labs  Lab 05/18/18 1639 05/19/18 0035  WBC 13.3* 12.4*  NEUTROABS 8.3*  --   HGB 13.3 12.5  HCT 39.5 37.2  MCV 92.3 93.0  PLT 289 347   Basic Metabolic Panel: Recent Labs  Lab 05/18/18 1639 05/19/18 0035  NA 135 137  K 4.2 3.8  CL 99* 103  CO2 26 25  GLUCOSE 89 90  BUN 28* 24*  CREATININE 0.79 0.76  CALCIUM 8.9 8.5*    GFR: Estimated Creatinine Clearance: 58.2 mL/min (by C-G formula based on SCr of 0.76 mg/dL). Liver Function Tests: Recent Labs  Lab 05/18/18 1639 05/19/18 0035  AST 15 11*  ALT 25 21  ALKPHOS 61 57  BILITOT 0.5 0.7  PROT 7.3 6.3*  ALBUMIN 4.1 3.7   No results for input(s): LIPASE, AMYLASE in the last 168 hours. No results for input(s): AMMONIA in the last 168 hours. Coagulation Profile: Recent Labs  Lab 05/18/18 1639  INR 0.98   Cardiac Enzymes: No results for input(s): CKTOTAL, CKMB, CKMBINDEX, TROPONINI in the last 168 hours. BNP (last 3 results) No results for input(s): PROBNP in the last 8760 hours. HbA1C: No results for input(s): HGBA1C in the last 72 hours. CBG: No results for input(s): GLUCAP in the last 168 hours. Lipid Profile: No results for input(s): CHOL, HDL, LDLCALC, TRIG, CHOLHDL, LDLDIRECT in the last 72 hours. Thyroid Function Tests: Recent Labs    05/18/18 2045  TSH 1.749   Anemia Panel: No results for input(s): VITAMINB12, FOLATE, FERRITIN, TIBC, IRON, RETICCTPCT in the last 72 hours. Sepsis Labs: No results for input(s): PROCALCITON, LATICACIDVEN in the last 168 hours.  No results found for this or any previous visit (from the past 240 hour(s)).   Radiology Studies: Ct Angio Chest Pe W Or Wo Contrast  Result Date: 05/18/2018 CLINICAL DATA:  Dyspnea for 4 weeks. EXAM: CT ANGIOGRAPHY CHEST WITH CONTRAST TECHNIQUE: Multidetector CT imaging of the chest was performed using the standard protocol during bolus administration of intravenous contrast. Multiplanar CT image reconstructions and MIPs were obtained to evaluate the vascular anatomy. CONTRAST:  100 cc Isovue 370 IV. COMPARISON:  12/02/2017 chest CT angiogram. FINDINGS: Cardiovascular: The study is high quality for the evaluation of pulmonary embolism. Acute pulmonary emboli are present at the bifurcation of the left pulmonary artery extending into the lobar, segmental and subsegmental pulmonary  artery branches of the left upper and left lower lobes. Acute lobar and segmental pulmonary emboli are present in the right upper, right middle and right lower lobes. No saddle pulmonary embolus. Atherosclerotic thoracic aorta with stable ectatic 4.2 cm ascending thoracic aorta. Dilated main pulmonary artery (3.5 cm diameter), stable. Normal heart size. No significant pericardial fluid/thickening. Elevated RV/LV ratio 1.4. Mediastinum/Nodes: No discrete thyroid nodules. Unremarkable esophagus. No pathologically enlarged axillary, mediastinal or hilar lymph nodes. Lungs/Pleura: No pneumothorax. No pleural effusion. Left upper lobe solid 4 mm pulmonary nodule (series 12/image 19), stable since 12/02/2017, probably benign. No acute consolidative airspace disease, lung masses or new significant pulmonary nodules. Upper abdomen: Cholecystectomy. Musculoskeletal: No aggressive appearing focal osseous lesions. Moderate thoracic spondylosis. Soft tissue anchors are noted in the right humeral head. Review of the MIP images confirms the above findings. IMPRESSION: 1. Acute large clot burden bilateral pulmonary embolism involving the bifurcation of the left  pulmonary artery and the lobar, segmental and subsegmental pulmonary artery branches of all lung lobes. 2. Positive for acute PE with CT evidence of right heart strain (RV/LV Ratio = 1.4) consistent with at least submassive (intermediate risk) PE. The presence of right heart strain has been associated with an increased risk of morbidity and mortality. 3. Dilated main pulmonary artery, stable since 12/02/2017, compatible with chronic pulmonary arterial hypertension. 4. No acute pulmonary disease. Solitary 4 mm left upper lobe pulmonary nodule is stable since 12/02/2017 chest CT, probably benign. No follow-up needed if patient is low-risk. Non-contrast chest CT can be considered in 12 months if patient is high-risk. This recommendation follows the consensus statement:  Guidelines for Management of Incidental Pulmonary Nodules Detected on CT Images: From the Fleischner Society 2017; Radiology 2017; 284:228-243. Aortic Atherosclerosis (ICD10-I70.0). Critical Value/emergent results were called by telephone at the time of interpretation on 05/18/2018 at 3:18 pm to Dr. Marton Mann , who verbally acknowledged these results. Electronically Signed   By: Ilona Sorrel M.D.   On: 05/18/2018 15:23   Scheduled Meds: . aspirin EC  81 mg Oral QHS  . busPIRone  10 mg Oral Daily  . escitalopram  10 mg Oral Daily  . irbesartan  150 mg Oral Daily  . loratadine  10 mg Oral QHS  . mirabegron ER  50 mg Oral Daily  . pantoprazole  40 mg Oral Daily  . primidone  150 mg Oral QHS  . saccharomyces boulardii  250 mg Oral BID   Continuous Infusions: . heparin 1,150 Units/hr (05/18/18 1651)    LOS: 0 days   Kerney Elbe, DO [facility] Pager [phone]  If 7PM-7AM, please contact night-coverage [email or web address] Password [email or web address] 05/19/2018, 8:06 AM

## 2018-05-19 NOTE — Progress Notes (Signed)
ANTICOAGULATION CONSULT NOTE - Follow Up Consult  Pharmacy Consult for Heparin Indication: pulmonary embolus  Allergies  Allergen Reactions  . Buprenorphine Hcl Nausea And Vomiting  . Codeine Nausea And Vomiting    Other reaction(s): Other (See Comments) But tolerates Tramadol But tolerates Tramadol Vomiting   . Morphine And Related Nausea And Vomiting  . Morphine     Other reaction(s): Other (See Comments) Vomiting  . Procaine     Other reaction(s): Other (See Comments) Other reaction(s): Shakes Novacaine-Shakes    Patient Measurements: Height: 4\' 11"  (149.9 cm) Weight: 213 lb 12.8 oz (97 kg) IBW/kg (Calculated) : 43.2 Heparin Dosing Weight:   Vital Signs: Temp: 98.3 F (36.8 C) (06/06 2029) Temp Source: Oral (06/06 2029) BP: 164/74 (06/06 2029) Pulse Rate: 72 (06/06 2029)  Labs: Recent Labs    05/18/18 1639 05/19/18 0035  HGB 13.3 12.5  HCT 39.5 37.2  PLT 289 243  APTT 23* 69*  LABPROT 12.9  --   INR 0.98  --   HEPARINUNFRC  --  0.57  CREATININE 0.79 0.76    Estimated Creatinine Clearance: 58.2 mL/min (by C-G formula based on SCr of 0.76 mg/dL).   Medications:  Infusions:  . heparin 1,150 Units/hr (05/18/18 1651)    Assessment: Patient with heparin level at goal.  No heparin issues noted.  Goal of Therapy:  Heparin level 0.3-0.7 units/ml Monitor platelets by anticoagulation protocol: Yes   Plan:  Continue heparin drip at current rate Recheck level at 0900  Tyler Deis, Shea Stakes Crowford 05/19/2018,4:54 AM

## 2018-05-19 NOTE — Evaluation (Addendum)
Physical Therapy Evaluation Patient Details Name: [name] MRN: [id number] DOB: [birth year] Today's Date: 05/19/2018   History of Present Illness  77 yo female admitted with PE. Hx of DVT, tremor, C diff, osteoporosis, DDD, obesity, back surgery 2013  Clinical Impression  On eval, pt was Min guard assist for mobility. She walked ~150 feet with a RW. O2 sat >90% on RA during session. Discussed d/c plan-pt will return to her Ind Living apt at [facility]. Pt presents with general weakness, decreased activity tolerance, and impaired gait and balance. She could potentially benefit from OP PT vs HHPT for general strength and conditioning if pt is agreeable. Pt stated she plans to discuss resuming therapy with her physician.     Follow Up Recommendations Outpatient PT vs HHPT (at [facility]. Pt may decide to set it up herself)    Equipment Recommendations  None recommended by PT    Recommendations for Other Services       Precautions / Restrictions Precautions Precautions: Fall Restrictions Weight Bearing Restrictions: No      Mobility  Bed Mobility Overal bed mobility: Needs Assistance Bed Mobility: Supine to Sit;Sit to Supine     Supine to sit: Supervision Sit to supine: Supervision      Transfers Overall transfer level: Needs assistance Equipment used: 4-wheeled walker Transfers: Sit to/from Stand Sit to Stand: Supervision         General transfer comment: for safety. VCs hand placement  Ambulation/Gait Ambulation/Gait assistance: Min guard Ambulation Distance (Feet): 150 Feet Assistive device: Rolling walker (2 wheeled) Gait Pattern/deviations: Step-through pattern     General Gait Details: slow gait speed. O2 sats >90% on RA  Stairs            Wheelchair Mobility    Modified Rankin (Stroke Patients Only)       Balance Overall balance assessment: Needs assistance           Standing balance-Leahy Scale: Fair                                Pertinent Vitals/Pain Pain Assessment: No/denies pain    Home Living Family/patient expects to be discharged to:: Private residence Living Arrangements: Spouse/significant other   Type of Home: Independent living facility Home Access: Level entry     Home Layout: One level Home Equipment: (3 wheeled walker)      Prior Function Level of Independence: Independent with assistive device(s)         Comments: walks to dining room. uses 3 wheeled walker     Hand Dominance        Extremity/Trunk Assessment   Upper Extremity Assessment Upper Extremity Assessment: Overall WFL for tasks assessed    Lower Extremity Assessment Lower Extremity Assessment: Generalized weakness    Cervical / Trunk Assessment Cervical / Trunk Assessment: Normal  Communication   Communication: No difficulties  Cognition Arousal/Alertness: Awake/alert Behavior During Therapy: WFL for tasks assessed/performed Overall Cognitive Status: Within Functional Limits for tasks assessed                                        General Comments      Exercises     Assessment/Plan    PT Assessment Patient needs continued PT services  PT Problem List Decreased mobility;Decreased strength       PT Treatment Interventions  Gait training;Therapeutic activities;Functional mobility training;Patient/family education;DME instruction;Balance training    PT Goals (Current goals can be found in the Care Plan section)  Acute Rehab PT Goals Patient Stated Goal: home soon PT Goal Formulation: With patient/family Time For Goal Achievement: 06/02/18 Potential to Achieve Goals: Good    Frequency Min 3X/week   Barriers to discharge        Co-evaluation               AM-PAC PT "6 Clicks" Daily Activity  Outcome Measure Difficulty turning over in bed (including adjusting bedclothes, sheets and blankets)?: A Little Difficulty moving from lying on back to sitting on the  side of the bed? : A Little Difficulty sitting down on and standing up from a chair with arms (e.g., wheelchair, bedside commode, etc,.)?: A Little Help needed moving to and from a bed to chair (including a wheelchair)?: A Little Help needed walking in hospital room?: A Little Help needed climbing 3-5 steps with a railing? : A Lot 6 Click Score: 17    End of Session Equipment Utilized During Treatment: Gait belt Activity Tolerance: Patient tolerated treatment well Patient left: in bed;with call bell/phone within reach;with family/visitor present   PT Visit Diagnosis: Muscle weakness (generalized) (M62.81);Difficulty in walking, not elsewhere classified (R26.2)    Time: 1941-7408 PT Time Calculation (min) (ACUTE ONLY): 15 min   Charges:   PT Evaluation $PT Eval Moderate Complexity: 1 Mod     PT G Codes:          Weston Anna, MPT Pager: [phone]

## 2018-05-19 NOTE — Progress Notes (Signed)
ANTICOAGULATION CONSULT NOTE - Follow Up Consult  Pharmacy Consult for Heparin Indication: pulmonary embolus  Allergies  Allergen Reactions  . Buprenorphine Hcl Nausea And Vomiting  . Codeine Nausea And Vomiting    Other reaction(s): Other (See Comments) But tolerates Tramadol But tolerates Tramadol Vomiting   . Morphine And Related Nausea And Vomiting  . Morphine     Other reaction(s): Other (See Comments) Vomiting  . Procaine     Other reaction(s): Other (See Comments) Other reaction(s): Shakes Novacaine-Shakes    Patient Measurements: Height: 4\' 11"  (149.9 cm) Weight: 213 lb 12.8 oz (97 kg) IBW/kg (Calculated) : 43.2 Heparin Dosing Weight: 67 kg  Vital Signs: Temp: 98 F (36.7 C) (06/07 0456) Temp Source: Oral (06/07 0456) BP: 149/76 (06/07 0456) Pulse Rate: 72 (06/07 0456)  Labs: Recent Labs    05/18/18 1639 05/19/18 0035  HGB 13.3 12.5  HCT 39.5 37.2  PLT 289 243  APTT 23* 69*  LABPROT 12.9  --   INR 0.98  --   HEPARINUNFRC  --  0.57  CREATININE 0.79 0.76    Estimated Creatinine Clearance: 58.2 mL/min (by C-G formula based on SCr of 0.76 mg/dL).   Medications:  Infusions:  . heparin 1,150 Units/hr (05/18/18 1651)    Assessment: 64 yoF admitted on 6/6 with large bilateral PE.  She was previously treated for a DVT with apixaban for 6 months, but has been off anticoagulation for ~ 2 yrs.  Pharmacy is consulted to dose heparin IV.  Baseline labs: INR 0.98, APTT 23  Today, 05/19/2018: Heparin level 0.49, therapeutic on heparin at 11.5 ml/hr CBC: Hgb 12.5, Plt 243 SCr 0.76  Goal of Therapy:  Heparin level 0.3-0.7 units/ml Monitor platelets by anticoagulation protocol: Yes   Plan:   Continue heparin IV infusion at 1150 units/hr  Daily heparin level and CBC  Continue to monitor H&H and platelets  F/u plans for long-term oral anticoagulation.  Case manager to review costs for both Eliquis and Xarelto.  Note that she previously was taking  Eliquis.  Gretta Arab PharmD, BCPS Pager [phone] 05/19/2018 8:06 AM

## 2018-05-19 NOTE — Progress Notes (Signed)
Preliminary notes--Bilateral lower extremities venous duplex exam completed.  Positive  for DVT involving right Peroneal veins. Other veins WNL.    A 1.51X1.08X0.73cm fluid collection noted at right popliteal fossa medial portion.  Limited visualization of left calf veins, study could not completely exclude left calf veins thrombosis.   Hongying Chanele Douglas (RDMS RVT) 05/19/18 9:15 AM

## 2018-05-20 DIAGNOSIS — I10 Essential (primary) hypertension: Secondary | ICD-10-CM

## 2018-05-20 LAB — CBC
HEMATOCRIT: 39.4 % (ref 36.0–46.0)
Hemoglobin: 13.2 g/dL (ref 12.0–15.0)
MCH: 31.4 pg (ref 26.0–34.0)
MCHC: 33.5 g/dL (ref 30.0–36.0)
MCV: 93.6 fL (ref 78.0–100.0)
Platelets: 236 10*3/uL (ref 150–400)
RBC: 4.21 MIL/uL (ref 3.87–5.11)
RDW: 14.1 % (ref 11.5–15.5)
WBC: 11 10*3/uL — AB (ref 4.0–10.5)

## 2018-05-20 LAB — ECHOCARDIOGRAM COMPLETE
Height: 59 in
Weight: 3420.8 oz

## 2018-05-20 LAB — HEPARIN LEVEL (UNFRACTIONATED): Heparin Unfractionated: 0.68 IU/mL (ref 0.30–0.70)

## 2018-05-20 MED ORDER — APIXABAN (ELIQUIS) EDUCATION KIT FOR DVT/PE PATIENTS: 1.0000 | PACK | Freq: Once | 0 refills | Status: AC

## 2018-05-20 MED ORDER — APIXABAN 5 MG PO TABS
10.0000 mg | ORAL_TABLET | Freq: Two times a day (BID) | ORAL | Status: DC
  Administered 2018-05-20: 10 mg via ORAL
  Filled 2018-05-20: qty 2

## 2018-05-20 MED ORDER — APIXABAN 5 MG PO TABS
5.0000 mg | ORAL_TABLET | Freq: Two times a day (BID) | ORAL | Status: DC
Start: 2018-05-27 — End: 2018-05-20

## 2018-05-20 MED ORDER — APIXABAN 5 MG PO TABS: ORAL_TABLET | ORAL | 0 refills | Status: DC

## 2018-05-20 NOTE — Progress Notes (Signed)
ANTICOAGULATION CONSULT NOTE - Follow Up Consult  Pharmacy Consult for Heparin Indication: pulmonary embolus  Allergies  Allergen Reactions  . Buprenorphine Hcl Nausea And Vomiting  . Codeine Nausea And Vomiting    Other reaction(s): Other (See Comments) But tolerates Tramadol But tolerates Tramadol Vomiting   . Morphine And Related Nausea And Vomiting  . Morphine     Other reaction(s): Other (See Comments) Vomiting  . Procaine     Other reaction(s): Other (See Comments) Other reaction(s): Shakes Novacaine-Shakes    Patient Measurements: Height: 4\' 11"  (149.9 cm) Weight: 209 lb 8 oz (95 kg) IBW/kg (Calculated) : 43.2 Heparin Dosing Weight: 67 kg  Vital Signs: Temp: 98.1 F (36.7 C) (06/08 0611) Temp Source: Oral (06/08 0611) BP: 142/80 (06/08 0611) Pulse Rate: 80 (06/08 0611)  Labs: Recent Labs    05/18/18 1639 05/19/18 0035 05/19/18 0853 05/19/18 1132 05/19/18 1340 05/19/18 2002 05/20/18 0406  HGB 13.3 12.5  --   --   --   --  13.2  HCT 39.5 37.2  --   --   --   --  39.4  PLT 289 243  --   --   --   --  236  APTT 23* 69*  --   --   --   --   --   LABPROT 12.9  --   --   --   --   --   --   INR 0.98  --   --   --   --   --   --   HEPARINUNFRC  --  0.57  --  0.49  --   --  0.68  CREATININE 0.79 0.76  --   --   --   --   --   TROPONINI  --   --  <0.03  --  <0.03 <0.03  --     Estimated Creatinine Clearance: 57.5 mL/min (by C-G formula based on SCr of 0.76 mg/dL).   Medications:  Infusions:  . heparin 1,150 Units/hr (05/19/18 1100)    Assessment: 41 yoF admitted on 6/6 with large bilateral PE.  She was previously treated for a DVT with apixaban for 6 months, but has been off anticoagulation for ~ 2 yrs.  Pharmacy is consulted to dose heparin IV.  Baseline labs: INR 0.98, APTT 23  Today, 05/20/2018:  Heparin level continues to be therapeutic on current IV heparin of 11.5 ml/hr  CBC stable WNL  No reported bleeding  Goal of Therapy:  Heparin  level 0.3-0.7 units/ml Monitor platelets by anticoagulation protocol: Yes   Plan:   Continue heparin IV infusion at 1150 units/hr  Daily heparin level and CBC  Continue to monitor H&H and platelets  F/u plans for long-term oral anticoagulation.  Case manager to review costs for both Eliquis and Xarelto.  Note that she previously was taking Eliquis.  Adrian Saran, PharmD, BCPS Pager [phone] 05/20/2018 7:16 AM

## 2018-05-20 NOTE — Discharge Summary (Signed)
Physician Discharge Summary  NIKI PAYMENT OFH:[id number] DOB: Mar 24, 1941 DOA: 05/18/2018  PCP: [name], MD  Admit date: 05/18/2018 Discharge date: 05/20/2018  Admitted From: Home Disposition: Home with Ali Molina PT/RN  Recommendations for Outpatient Follow-up:  1. Follow up with PCP in 1-2 weeks 2. Follow up with Hematology in 1-2 weeks for Hypercoaguable workup 3. Follow up for Non-Contrast CT of Chest for Pulmonary Nodule in 12 months  4. Follow up with Orthopedic Surgery as an outpatient for Popliteal Fossa Fluid Collection 5. Please obtain CMP/CBC, Mag, Phos in one week 6. Please follow up on the following pending results:  Home Health: No Equipment/Devices: None   Discharge Condition: Stable CODE STATUS: FULL CODE Diet recommendation: Heart Healthy Diet  Brief/Interim Summary: Shirley B Haggertyis a 77 y.o.femalewith medical history significant ofhypertension, DVT of the left leg treated with oral blood thinners for 6 months, currently not on any anticoagulation for about 2 years, essential tremor, C. difficile colitis, GERD was sent to ED by her primary care provider because of blood clot in her lungs.   Patient had seen her primary care provider yesterday for worsening shortness of breath. Patient states that she has been having worsening shortness of breath for the last 3 weeks, progressively worsening, exacerbated by minimal exertion, not associated with cough. She also has had intermittent chest pains. She feels that her legs are mildly swollen. No decreased urine output. No recent travel or major surgery. She was seen by her primary care provider yesterday who had ordered a CT of the chest which was done today which showed pulmonary embolism and she was referred to the emergency room. She was placed on heparin drip. Hospitalist service was called to evaluate the patient.   She was found to have a blood Clot in Right Leg as well and worked up. Echocardiogram  done and showed Normal EF and no RV Strain.  Was transitioned off of a heparin drip to oral anticoagulation and deemed medically stable to be discharged home to follow-up with PCP and she also need follow-up with oncology for hypercoagulable work-up.  She will also need outpatient evaluation for left lung nodule.  She was deemed medically stable at this time as she is not hypoxic and will be discharged home today.   Discharge Diagnoses:  Active Problems:   Essential tremor   Pulmonary embolism (HCC)   HTN (hypertension)   Pulmonary embolus (HCC)  Acute pulmonary embolism in association with Right Leg DVT of Unknown Chronicity  -CTA of Chest showed Acute large clot burden bilateral pulmonary embolism involving the bifurcation of the left pulmonary artery and the lobar, segmental and subsegmental pulmonary artery branches of all lung lobes. Positive for acute PE with CT evidence of right heart strain (RV/LV Ratio = 1.4) consistent with at least submassive (intermediate risk) PE. The presence of right heart strain has been associated with an increased risk of morbidity and mortality. Dilated main pulmonary artery, stable since 12/02/2017, compatible with chronic pulmonary arterial hypertension.  No acute pulmonary disease.  -Heparin drip has been started in the ED which will be continued. Pharmacy will be consulted for the same -Patient was on Apixaban previously and will change  -Transthoracic ECHOCardiogram done and showed EF of 65-70% with Grade 1 DD, normal wall motion, and no regional wall motion abnormalities and there was a normal RV size and Systolic Function, Normal RVSP and no RV Strain -Lower extremity ultrasound duplex to rule out DVT showed: Ultrasound is unable to distinguish whether obstruction  in the Peroneal veins is acute or chronic. Left: There is no evidence of deep vein thrombosis in the lower extremity. However, portions of this examination were limited- see technologist comments  above -Troponin I being cycled and Negative   -Continued Telemetry monitoring while hospitalized  -Ambulatory Screen done and patient did not desaturate -Changed to NOAC Apixaban today  -Will give Free 30 Day card for NOAC and Pharmacy to educate prior to D/C -PESI Class Low -Follow up with PCP and follow up with Hematology for outpatient Hypercoagulable Workup   Leukocytosis, improving -Probably reactive secondary to above and improving -WBC went from 13.3 -> 12.4 -> 11.0 -Continue to monitor for signs and symptoms of infection -Repeat CBC in the a.m.  History of lower extremity DVT in the past treated with oral anticoagulation  -Plan as above -LE Duplex showed Positivefor DVT involving right Peroneal veins.Other veins WNL.A 1.51X1.08X0.73cm fluid collection noted at right popliteal fossa medial portion. Limited visualization of left calf veins, study could not completely exclude left calf veins thrombosis.; Dr. Oneida Alar cannot distinguish if DVT is acute or Chronic and no definitive thrombus in Left Leg  -Treatment as above  Popliteal Fossa Fluid Collection -Follow up with Orthopedic Surgery as an outpatient   Hypertension -Blood pressure is extremely elevated on Admission.  -Resumed Home Antihypertensive Medications with Irbesartan 150 mg p.o. Daily -Continue with IV hydralazine 5 mg every 6 hours as needed for systolic blood pressure greater than 258 or diastolic blood pressure greater than 100  Essential tremor/benign familial tremor -Continue Primidone 150 mg p.o. Nightly -Resume Home Propanolol   Morbid Obesity -Outpatient follow-up.  -Weight Loss Counseling given  Left Upper Lobe Pulmonary Nodule -Noted to be Solitary 4 mm in Left Upper Lobe -Follow up with Non-Contrast Chest CT in 12 months  GERD -Continue with famotidine 10 mg p.o. nightly as needed for heartburn and indigestion and continue pantoprazole 40 mg p.o. daily  History of C. difficile  infection -Continue Florastor 250 mils p.o. twice daily  Discharge Instructions Discharge Instructions    Call MD for:  difficulty breathing, headache or visual disturbances   Complete by:  As directed    Call MD for:  extreme fatigue   Complete by:  As directed    Call MD for:  hives   Complete by:  As directed    Call MD for:  persistant dizziness or light-headedness   Complete by:  As directed    Call MD for:  persistant nausea and vomiting   Complete by:  As directed    Call MD for:  redness, tenderness, or signs of infection (pain, swelling, redness, odor or green/yellow discharge around incision site)   Complete by:  As directed    Call MD for:  severe uncontrolled pain   Complete by:  As directed    Call MD for:  temperature >100.4   Complete by:  As directed    Diet - low sodium heart healthy   Complete by:  As directed    Discharge instructions   Complete by:  As directed    Follow-up with PCP in outpatient hematology.  Take all medication as prescribed.  If symptoms change or worsen please return to emergency room for evaluation.   Increase activity slowly   Complete by:  As directed      Allergies as of 05/20/2018      Reactions   Buprenorphine Hcl Nausea And Vomiting   Codeine Nausea And Vomiting   Other reaction(s): Other (  See Comments) But tolerates Tramadol But tolerates Tramadol Vomiting   Morphine And Related Nausea And Vomiting   Morphine    Other reaction(s): Other (See Comments) Vomiting   Procaine    Other reaction(s): Other (See Comments) Other reaction(s): Shakes Novacaine-Shakes      Medication List    STOP taking these medications   ibuprofen 200 MG tablet Commonly known as:  ADVIL,MOTRIN   traMADol 50 MG tablet Commonly known as:  ULTRAM     TAKE these medications   apixaban 5 MG Tabs tablet Commonly known as:  ELIQUIS Take 10 mg (Two 5 mg Tablets) po BID for 7 days and then Take 5 mg po BID onwards   apixaban Kit Commonly known  as:  ELIQUIS 1 kit by Does not apply route once for 1 dose.   aspirin EC 81 MG tablet Take 81 mg by mouth at bedtime.   busPIRone 10 MG tablet Commonly known as:  BUSPAR Take 10 mg by mouth daily.   calcium citrate-vitamin D 315-200 MG-UNIT tablet Commonly known as:  CITRACAL+D Take 2 tablets by mouth at bedtime.   escitalopram 10 MG tablet Commonly known as:  LEXAPRO Take 10 mg by mouth daily.   esomeprazole 40 MG capsule Commonly known as:  NEXIUM Take 40 mg by mouth at bedtime.   famotidine 20 MG tablet Commonly known as:  PEPCID Take 20 mg by mouth at bedtime as needed for heartburn or indigestion.   lidocaine 5 % Commonly known as:  LIDODERM Place 1 patch onto the skin daily. Remove & Discard patch within 12 hours or as directed by MD   loratadine 10 MG tablet Commonly known as:  CLARITIN Take 10 mg by mouth at bedtime.   MYRBETRIQ 50 MG Tb24 tablet Generic drug:  mirabegron ER Take 50 mg by mouth daily.   olmesartan 20 MG tablet Commonly known as:  BENICAR Take 20 mg by mouth daily.   primidone 50 MG tablet Commonly known as:  MYSOLINE TAKE THREE TABLETS BY MOUTH AT BEDTIME   propranolol 20 MG tablet Commonly known as:  INDERAL Take 1 tablet (20 mg total) by mouth 2 (two) times daily.   saccharomyces boulardii 250 MG capsule Commonly known as:  FLORASTOR Take 250 mg by mouth 2 (two) times daily.   trimethoprim 100 MG tablet Commonly known as:  TRIMPEX Take 100 mg by mouth daily.   Vitamin D3 2000 units capsule Take 1 capsule by mouth at bedtime.       Allergies  Allergen Reactions  . Buprenorphine Hcl Nausea And Vomiting  . Codeine Nausea And Vomiting    Other reaction(s): Other (See Comments) But tolerates Tramadol But tolerates Tramadol Vomiting   . Morphine And Related Nausea And Vomiting  . Morphine     Other reaction(s): Other (See Comments) Vomiting  . Procaine     Other reaction(s): Other (See Comments) Other reaction(s):  Shakes Novacaine-Shakes   Consultations:  None  Procedures/Studies: Ct Angio Chest Pe W Or Wo Contrast  Result Date: 05/18/2018 CLINICAL DATA:  Dyspnea for 4 weeks. EXAM: CT ANGIOGRAPHY CHEST WITH CONTRAST TECHNIQUE: Multidetector CT imaging of the chest was performed using the standard protocol during bolus administration of intravenous contrast. Multiplanar CT image reconstructions and MIPs were obtained to evaluate the vascular anatomy. CONTRAST:  100 cc Isovue 370 IV. COMPARISON:  12/02/2017 chest CT angiogram. FINDINGS: Cardiovascular: The study is high quality for the evaluation of pulmonary embolism. Acute pulmonary emboli are present at the bifurcation  of the left pulmonary artery extending into the lobar, segmental and subsegmental pulmonary artery branches of the left upper and left lower lobes. Acute lobar and segmental pulmonary emboli are present in the right upper, right middle and right lower lobes. No saddle pulmonary embolus. Atherosclerotic thoracic aorta with stable ectatic 4.2 cm ascending thoracic aorta. Dilated main pulmonary artery (3.5 cm diameter), stable. Normal heart size. No significant pericardial fluid/thickening. Elevated RV/LV ratio 1.4. Mediastinum/Nodes: No discrete thyroid nodules. Unremarkable esophagus. No pathologically enlarged axillary, mediastinal or hilar lymph nodes. Lungs/Pleura: No pneumothorax. No pleural effusion. Left upper lobe solid 4 mm pulmonary nodule (series 12/image 19), stable since 12/02/2017, probably benign. No acute consolidative airspace disease, lung masses or new significant pulmonary nodules. Upper abdomen: Cholecystectomy. Musculoskeletal: No aggressive appearing focal osseous lesions. Moderate thoracic spondylosis. Soft tissue anchors are noted in the right humeral head. Review of the MIP images confirms the above findings. IMPRESSION: 1. Acute large clot burden bilateral pulmonary embolism involving the bifurcation of the left pulmonary  artery and the lobar, segmental and subsegmental pulmonary artery branches of all lung lobes. 2. Positive for acute PE with CT evidence of right heart strain (RV/LV Ratio = 1.4) consistent with at least submassive (intermediate risk) PE. The presence of right heart strain has been associated with an increased risk of morbidity and mortality. 3. Dilated main pulmonary artery, stable since 12/02/2017, compatible with chronic pulmonary arterial hypertension. 4. No acute pulmonary disease. Solitary 4 mm left upper lobe pulmonary nodule is stable since 12/02/2017 chest CT, probably benign. No follow-up needed if patient is low-risk. Non-contrast chest CT can be considered in 12 months if patient is high-risk. This recommendation follows the consensus statement: Guidelines for Management of Incidental Pulmonary Nodules Detected on CT Images: From the Fleischner Society 2017; Radiology 2017; 284:228-243. Aortic Atherosclerosis (ICD10-I70.0). Critical Value/emergent results were called by telephone at the time of interpretation on 05/18/2018 at 3:18 pm to Dr. [name] , who verbally acknowledged these results. Electronically Signed   By: Ilona Sorrel M.D.   On: 05/18/2018 15:23   ECHOCARDIOGRAM  ------------------------------------------------------------------- Study Conclusions  - Left ventricle: The cavity size was normal. There was mild focal   basal hypertrophy of the septum. Systolic function was vigorous.   The estimated ejection fraction was in the range of 65% to 70%.   Wall motion was normal; there were no regional wall motion   abnormalities. Doppler parameters are consistent with abnormal   left ventricular relaxation (grade 1 diastolic dysfunction).   Doppler parameters are consistent with elevated ventricular   end-diastolic filling pressure. - Aortic valve: There was mild regurgitation. Valve area (VTI):   2.58 cm^2. Valve area (Vmax): 2.54 cm^2. Valve area (Vmean): 2.78   cm^2. - Left  atrium: The atrium was mildly dilated.  Impressions:  - Normal RV size and systolic function, normal RVSP. No RV strain.  LE VENOUS DUPLEX Preliminary notes--Bilateral lower extremities venous duplex exam completed.Positivefor DVT involving right Peroneal veins.Other veins WNL.A 1.51X1.08X0.73cm fluid collection noted at right popliteal fossa medial portion. Limited visualization of left calf veins, study could not completely exclude left calf veins thrombosis.  Final Interpretation: Right: Ultrasound is unable to distinguish whether obstruction in the Peroneal veins is acute or chronic. Left: There is no evidence of deep vein thrombosis in the lower extremity. However, portions of this examination were limited- see technologist comments above.  Subjective: Examined at bedside and was doing well.  She is slightly anxious but in no acute distress.  No  other concerns or complaints and denied any chest pain, shortness breath, nausea, vomiting.  Ready to go home.  Discharge Exam: Vitals:   05/19/18 2245 05/20/18 0611  BP: (!) 152/68 (!) 142/80  Pulse: 82 80  Resp: 20 18  Temp: 98.6 F (37 C) 98.1 F (36.7 C)  SpO2: 94% 91%   Vitals:   05/19/18 1215 05/19/18 2245 05/20/18 0500 05/20/18 0611  BP: (!) 144/68 (!) 152/68  (!) 142/80  Pulse: 85 82  80  Resp: '16 20  18  ' Temp: 98 F (36.7 C) 98.6 F (37 C)  98.1 F (36.7 C)  TempSrc: Oral Oral  Oral  SpO2: 96% 94%  91%  Weight:   95 kg (209 lb 8 oz)   Height:       General: Pt is alert, awake, not in acute distress Cardiovascular: RRR, S1/S2 +, no rubs, no gallops Respiratory: Diminished bilaterally, no wheezing, no rhonchi Abdominal: Soft, NT, ND, bowel sounds + Extremities: Some edema and Left Leg bigger than Right, no cyanosis; Has some tremors noted   The results of significant diagnostics from this hospitalization (including imaging, microbiology, ancillary and laboratory) are listed below for reference.     Microbiology: No results found for this or any previous visit (from the past 240 hour(s)).   Labs: BNP (last 3 results) No results for input(s): BNP in the last 8760 hours. Basic Metabolic Panel: Recent Labs  Lab 05/18/18 1639 05/19/18 0035  NA 135 137  K 4.2 3.8  CL 99* 103  CO2 26 25  GLUCOSE 89 90  BUN 28* 24*  CREATININE 0.79 0.76  CALCIUM 8.9 8.5*   Liver Function Tests: Recent Labs  Lab 05/18/18 1639 05/19/18 0035  AST 15 11*  ALT 25 21  ALKPHOS 61 57  BILITOT 0.5 0.7  PROT 7.3 6.3*  ALBUMIN 4.1 3.7   No results for input(s): LIPASE, AMYLASE in the last 168 hours. No results for input(s): AMMONIA in the last 168 hours. CBC: Recent Labs  Lab 05/18/18 1639 05/19/18 0035 05/20/18 0406  WBC 13.3* 12.4* 11.0*  NEUTROABS 8.3*  --   --   HGB 13.3 12.5 13.2  HCT 39.5 37.2 39.4  MCV 92.3 93.0 93.6  PLT 289 243 236   Cardiac Enzymes: Recent Labs  Lab 05/19/18 0853 05/19/18 1340 05/19/18 2002  TROPONINI <0.03 <0.03 <0.03   BNP: Invalid input(s): POCBNP CBG: No results for input(s): GLUCAP in the last 168 hours. D-Dimer No results for input(s): DDIMER in the last 72 hours. Hgb A1c No results for input(s): HGBA1C in the last 72 hours. Lipid Profile No results for input(s): CHOL, HDL, LDLCALC, TRIG, CHOLHDL, LDLDIRECT in the last 72 hours. Thyroid function studies Recent Labs    05/18/18 2045  TSH 1.749   Anemia work up No results for input(s): VITAMINB12, FOLATE, FERRITIN, TIBC, IRON, RETICCTPCT in the last 72 hours. Urinalysis    Component Value Date/Time   COLORURINE YELLOW 12/02/2017 0143   APPEARANCEUR CLEAR 12/02/2017 0143   LABSPEC 1.013 12/02/2017 0143   PHURINE 6.0 12/02/2017 0143   GLUCOSEU NEGATIVE 12/02/2017 0143   HGBUR SMALL (A) 12/02/2017 0143   BILIRUBINUR NEGATIVE 12/02/2017 0143   KETONESUR NEGATIVE 12/02/2017 0143   PROTEINUR NEGATIVE 12/02/2017 0143   UROBILINOGEN 0.2 08/24/2015 2040   NITRITE NEGATIVE 12/02/2017  0143   LEUKOCYTESUR TRACE (A) 12/02/2017 0143   Sepsis Labs Invalid input(s): PROCALCITONIN,  WBC,  LACTICIDVEN Microbiology No results found for this or any previous visit (from  the past 240 hour(s)).  Time coordinating discharge: 35 minutes  SIGNED:  Kerney Elbe, DO [facility] 05/20/2018, 1:12 PM Pager [phone]  If 7PM-7AM, please contact night-coverage [email or web address] Password [email or web address]

## 2018-05-20 NOTE — Progress Notes (Signed)
ANTICOAGULATION CONSULT NOTE - Follow Up Consult  Pharmacy Consult for Eliquis Indication: pulmonary embolus  Allergies  Allergen Reactions  . Buprenorphine Hcl Nausea And Vomiting  . Codeine Nausea And Vomiting    Other reaction(s): Other (See Comments) But tolerates Tramadol But tolerates Tramadol Vomiting   . Morphine And Related Nausea And Vomiting  . Morphine     Other reaction(s): Other (See Comments) Vomiting  . Procaine     Other reaction(s): Other (See Comments) Other reaction(s): Shakes Novacaine-Shakes    Patient Measurements: Height: 4\' 11"  (149.9 cm) Weight: 209 lb 8 oz (95 kg) IBW/kg (Calculated) : 43.2 Heparin Dosing Weight: 67 kg  Vital Signs: Temp: 98.1 F (36.7 C) (06/08 0611) Temp Source: Oral (06/08 0611) BP: 142/80 (06/08 0611) Pulse Rate: 80 (06/08 0611)  Labs: Recent Labs    05/18/18 1639 05/19/18 0035 05/19/18 0853 05/19/18 1132 05/19/18 1340 05/19/18 2002 05/20/18 0406  HGB 13.3 12.5  --   --   --   --  13.2  HCT 39.5 37.2  --   --   --   --  39.4  PLT 289 243  --   --   --   --  236  APTT 23* 69*  --   --   --   --   --   LABPROT 12.9  --   --   --   --   --   --   INR 0.98  --   --   --   --   --   --   HEPARINUNFRC  --  0.57  --  0.49  --   --  0.68  CREATININE 0.79 0.76  --   --   --   --   --   TROPONINI  --   --  <0.03  --  <0.03 <0.03  --     Estimated Creatinine Clearance: 57.5 mL/min (by C-G formula based on SCr of 0.76 mg/dL).   Medications:  Infusions:  . heparin 1,150 Units/hr (05/20/18 1043)    Assessment: 65 yoF admitted on 6/6 with large bilateral PE.  She was previously treated for a DVT with apixaban for 6 months, but has been off anticoagulation for ~ 2 yrs.  Pharmacy is consulted to dose Eliquis.  Baseline labs: INR 0.98, APTT 23  Today, 05/20/2018:  Heparin level continues to be therapeutic on current IV heparin of 11.5 ml/hr  CBC stable WNL  No reported bleeding  Goal of Therapy:  Heparin level  0.3-0.7 units/ml Monitor platelets by anticoagulation protocol: Yes   Plan:  Stop heparin gtt, at the same time, give the first dose of Eliquis. Eliquis 10mg  PO BID x 7 days, followed by Eliquis 5 mg PO BID. Education completed on 6/8.   Gretta Arab PharmD, BCPS Pager [phone] 05/20/2018 1:06 PM

## 2018-05-20 NOTE — Progress Notes (Signed)
Discussed with patient discharge instructions, she verbalized agreement and understanding.  Patient to go home with all belongings in private vehicle. 

## 2018-05-21 NOTE — Care Management Note (Signed)
Case Management Note  Patient Details  Name: [name] MRN: [id number] Date of Birth: April 03, 1941  Subjective/Objective:  PE, DVT, Essential Tumor                  Action/Plan: Please see previous NCM. Contacted [facility] to make aware of dc home with HH. Eliquis 30 day free trial card given.   Expected Discharge Date:  05/20/18               Expected Discharge Plan:  Assisted Living / Rest Home([facility])  In-House Referral:  NA  Discharge planning Services  CM Consult  Post Acute Care Choice:  Home Health Choice offered to:  Patient  DME Arranged:  N/A DME Agency:  NA  HH Arranged:  RN, PT [place] Agency:  [place] (now [facility])  Status of Service:  Completed, signed off  If discussed at [employer] of Stay Meetings, dates discussed:    Additional Comments:  Erenest Rasher, RN 05/21/2018, 4:52 PM

## 2018-05-22 ENCOUNTER — Telehealth: Payer: Self-pay | Admitting: Hematology and Oncology

## 2018-05-22 NOTE — Telephone Encounter (Signed)
New hematology appt has been scheduled for the pt to see Dr. Alvy Bimler on 6/17 at 11:15am. Pt aware to arrive 30 minutes early.

## 2018-05-29 ENCOUNTER — Telehealth: Payer: Self-pay | Admitting: Hematology and Oncology

## 2018-05-29 ENCOUNTER — Encounter: Payer: Self-pay | Admitting: Hematology and Oncology

## 2018-05-29 ENCOUNTER — Inpatient Hospital Stay: Payer: Medicare Other | Attending: Hematology and Oncology | Admitting: Hematology and Oncology

## 2018-05-29 ENCOUNTER — Encounter (HOSPITAL_COMMUNITY): Payer: Medicare Other

## 2018-05-29 VITALS — BP 114/67 | HR 94 | Temp 98.1°F | Resp 18 | Ht 59.0 in | Wt 214.6 lb

## 2018-05-29 DIAGNOSIS — I10 Essential (primary) hypertension: Secondary | ICD-10-CM

## 2018-05-29 DIAGNOSIS — Z7901 Long term (current) use of anticoagulants: Secondary | ICD-10-CM

## 2018-05-29 DIAGNOSIS — I2699 Other pulmonary embolism without acute cor pulmonale: Secondary | ICD-10-CM

## 2018-05-29 DIAGNOSIS — Z86718 Personal history of other venous thrombosis and embolism: Secondary | ICD-10-CM

## 2018-05-29 DIAGNOSIS — Z87891 Personal history of nicotine dependence: Secondary | ICD-10-CM | POA: Diagnosis not present

## 2018-05-29 NOTE — Telephone Encounter (Signed)
Gave patient avs and calendar of upcoming June 2020 appointments.  °

## 2018-05-29 NOTE — Progress Notes (Signed)
[place] NOTE  Patient Care Team: [name], MD as PCP - General (Internal Medicine)  CHIEF COMPLAINTS/PURPOSE OF CONSULTATION:  Recurrent DVT/bilateral PE  ASSESSMENT:  Recurrent DVT/PE  PLAN:  I reviewed with the patient about the plan for care for recurrent DVT/PE.  This last episode of blood clot appeared to be provoked.  I believe her poor mobility, possible dehydration due to recurrent bladder infection may have played a role Patient with history of DVT is prone to get recurrent DVT without further anticoagulation therapy within the first 3 years of discontinuation of treatment  I do plan to order testing for thrombophilia disorder. Her current anticoagulation therapy will interfere with the tests and it is not possible to interpret the test results.  Taking her off the anticoagulation therapy to do the tests may precipitate another thrombotic event. I do not see a reason to order excessive testing to screen for thrombophilia disorder as it would not change our management.  The goal of anticoagulation therapy is for life.   For now, I recommend the patient to continue her treatment with Eliquis  I plan to see her back a year from now to discuss extended duration treatment beyond 1 year of anticoagulation therapy at reduced dose Eliquis We discussed the importance of monitoring for signs and symptoms of bleeding She will continue close follow-up with primary care doctor for blood count monitoring and kidney function check while on Eliquis  HISTORY OF PRESENTING ILLNESS:  [name] 77 y.o. female is here because of recent diagnosis of recurrent DVT and PE  The patient is an excellent historian She has history of left lower extremity DVT diagnosed around April 2017. She could not recall the circumstances that might have led to development of DVT at that time.  She recalled that her legs were swollen, left greater than right She underwent  ultrasound venous Doppler and was diagnosed with DVT She completed 6 months of anticoagulation therapy with Eliquis  Since 2017, she had been ill She had numerous falls and with poor mobility. She uses a walker on a regular basis She also had recurrent bladder infection leading to subsequent complication with diagnosis of C. Difficile.  Prior to this recent admission, she has not been feeling well with another recurrent bladder infection and was treated with antibiotics treatment.   It is possible that she may have been dehydrated although she claims she is drinking plenty of fluids on a regular basis. She presented to see her primary care doctor for an annual visit and complain of shortness of breath for over 2 to 3 weeks with minimum exertion. She underwent CT imaging PE protocol on 05/18/18 which showed;  1. Acute large clot burden bilateral pulmonary embolism involving the bifurcation of the left pulmonary artery and the lobar, segmental and subsegmental pulmonary artery branches of all lung lobes. 2. Positive for acute PE with CT evidence of right heart strain (RV/LV Ratio = 1.4) consistent with at least submassive (intermediate risk) PE. The presence of right heart strain has been associated with an increased risk of morbidity and mortality. 3. Dilated main pulmonary artery, stable since 12/02/2017, compatible with chronic pulmonary arterial hypertension. 4. No acute pulmonary disease. Solitary 4 mm left upper lobe pulmonary nodule is stable since 12/02/2017 chest CT, probably benign. No follow-up needed if patient is low-risk  She denies lower extremity swelling, warmth, tenderness & erythema.  She denies recent chest pain on exertion, pre-syncopal episodes, hemoptysis, or palpitation. She  denies recent history of trauma, long distance travel, dehydration, recent surgery or smoking. She had no prior history or diagnosis of cancer. Her age appropriate screening programs are up-to-date. She  had prior surgeries before and never had perioperative thromboembolic events. The patient had been exposed to birth control pills for 20-25 years, hormone replacement therapy for 10-15 years and never had thrombotic events. The patient had been pregnant before x 2 and denies history of peripartum thromboembolic event or history of recurrent miscarriages. There is no family history of blood clots or miscarriages.  She was subsequently hospitalized from 05/18/2018 to 05/20/2018.  She received IV heparin, subsequently transitioned to oral anticoagulation therapy. Baseline echocardiogram showed no evidence of cardiomyopathy from left ventricular strain Ultrasound venous Doppler on 05/19/2018 showed: Right: Ultrasound is unable to distinguish whether obstruction in the Peroneal veins is acute or chronic. Left: There is no evidence of deep vein thrombosis in the lower extremity. However, portions of this examination were limited- see technologist comments above.  The patient denies any recent signs or symptoms of bleeding such as spontaneous epistaxis, hematuria or hematochezia. Her shortness of breath has improved since recent discharge She denies recent chest pain  MEDICAL HISTORY:  Past Medical History:  Diagnosis Date  . 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 Benign familial tremor   . Clostridium difficile infection   . DDD (degenerative disc disease), lumbar   . Diverticulosis   . DVT (deep venous thrombosis) (HCC)    left leg  . Gallstones   . GERD (gastroesophageal reflux disease)   . Hypertension   . Osteoporosis   . Wears glasses     SURGICAL HISTORY: Past Surgical History:  Procedure Laterality Date  . ABDOMINAL HYSTERECTOMY  1983  . arterial vascular fistula  2004   coiled at [place]  [phone]  . CARPAL TUNNEL RELEASE  right  . CHOLECYSTECTOMY N/A 03/29/2013   Procedure: LAPAROSCOPIC CHOLECYSTECTOMY WITH INTRAOPERATIVE CHOLANGIOGRAM;  Surgeon:  Haywood Lasso, MD;  Location: [facility];  Service: General;  Laterality: N/A;  . EUS N/A 03/09/2013   Procedure: UPPER ENDOSCOPIC ULTRASOUND (EUS) LINEAR;  Surgeon: Beryle Beams, MD;  Location: [facility];  Service: Endoscopy;  Laterality: N/A;  . LUMBAR LAMINECTOMY/DECOMPRESSION MICRODISCECTOMY  11/30/2012   Procedure: LUMBAR LAMINECTOMY/DECOMPRESSION MICRODISCECTOMY 2 LEVELS;  Surgeon: Magnus Sinning, MD;  Location: [facility];  Service: Orthopedics;  Laterality: N/A;  DECOMPRESSION LAMINECTOMY L4-L5, L5-S1 CENTRAL   . ROTATOR CUFF REPAIR Bilateral 2004   two on right, one on left  . TRIGGER FINGER RELEASE Right     hand    SOCIAL HISTORY: Social History   Socioeconomic History  . Marital status: Married    Spouse name: Richard  . Number of children: 2  . Years of education: 39  . Highest education level: Not on file  Occupational History  . 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 Financial resource strain: Not on file  . Food insecurity:    Worry: Not on file    Inability: Not on file  . Transportation needs:    Medical: Not on file    Non-medical: Not on file  Tobacco Use  . Smoking status: Former Smoker    Packs/day: 0.50    Types: Cigarettes    Last attempt to quit: 07/11/1970    Years since quitting: 47.9  . Smokeless tobacco: Never Used  Substance and Sexual Activity  .  Alcohol use: Yes    Alcohol/week: 0.0 oz    Comment: wine occasionally  . Drug use: No  . Sexual activity: Not Currently  Lifestyle  . Physical activity:    Days per week: Not on file    Minutes per session: Not on file  . Stress: Not on file  Relationships  . Social connections:    Talks on phone: Not on file    Gets together: Not on file    Attends religious service: Not on file    Active member of club or organization: Not on file    Attends meetings of clubs or organizations: Not on file    Relationship status: Not on file   . Intimate partner violence:    Fear of current or ex partner: Not on file    Emotionally abused: Not on file    Physically abused: Not on file    Forced sexual activity: Not on file  Other Topics Concern  . Not on file  Social History Narrative   Patient is right handed, resides with spouse in a home.   1 cup of coffee a day     FAMILY HISTORY: Family History  Problem Relation Age of Onset  . Other Mother        sarcoma  . Other Father        broken hip  . Dementia Father   . Pneumonia Father     ALLERGIES:  is allergic to buprenorphine hcl; codeine; morphine and related; morphine; and procaine.  MEDICATIONS:  Current Outpatient Medications  Medication Sig Dispense Refill  . apixaban (ELIQUIS) 5 MG TABS tablet Take 10 mg (Two 5 mg Tablets) po BID for 7 days and then Take 5 mg po BID onwards 70 tablet 0  . aspirin EC 81 MG tablet Take 81 mg by mouth at bedtime.    . busPIRone (BUSPAR) 10 MG tablet Take 10 mg by mouth daily.    . calcium citrate-vitamin D (CITRACAL+D) 315-200 MG-UNIT tablet Take 2 tablets by mouth at bedtime.    . Cholecalciferol (VITAMIN D3) 2000 units capsule Take 1 capsule by mouth at bedtime.    Marland Kitchen escitalopram (LEXAPRO) 10 MG tablet Take 10 mg by mouth daily.    Marland Kitchen esomeprazole (NEXIUM) 40 MG capsule Take 40 mg by mouth at bedtime.     . famotidine (PEPCID) 20 MG tablet Take 20 mg by mouth at bedtime as needed for heartburn or indigestion.    . lidocaine (LIDODERM) 5 % Place 1 patch onto the skin daily. Remove & Discard patch within 12 hours or as directed by MD 14 patch 0  . loratadine (CLARITIN) 10 MG tablet Take 10 mg by mouth at bedtime.    . mirabegron ER (MYRBETRIQ) 50 MG TB24 tablet Take 50 mg by mouth daily.     Marland Kitchen olmesartan (BENICAR) 20 MG tablet Take 20 mg by mouth daily.    . primidone (MYSOLINE) 50 MG tablet TAKE THREE TABLETS BY MOUTH AT BEDTIME 90 tablet 5  . propranolol (INDERAL) 20 MG tablet Take 1 tablet (20 mg total) by mouth 2 (two)  times daily. 180 tablet 1  . saccharomyces boulardii (FLORASTOR) 250 MG capsule Take 250 mg by mouth 2 (two) times daily.    Marland Kitchen trimethoprim (TRIMPEX) 100 MG tablet Take 100 mg by mouth daily.      No current facility-administered medications for this visit.     REVIEW OF SYSTEMS:   Constitutional: Denies fevers, chills or abnormal  night sweats Eyes: Denies blurriness of vision, double vision or watery eyes Ears, nose, mouth, throat, and face: Denies mucositis or sore throat Respiratory: Denies cough or wheezes Gastrointestinal:  Denies nausea, heartburn or change in bowel habits Skin: Denies abnormal skin rashes Lymphatics: Denies new lymphadenopathy or easy bruising Neurological:Denies numbness, tingling or new weaknesses Behavioral/Psych: Mood is stable, no new changes  All other systems were reviewed with the patient and are negative.  PHYSICAL EXAMINATION: ECOG PERFORMANCE STATUS: 2 - Symptomatic, <50% confined to bed  Vitals:   05/29/18 1135  BP: 114/67  Pulse: 94  Resp: 18  Temp: 98.1 F (36.7 C)  SpO2: 97%   Filed Weights   05/29/18 1135  Weight: 214 lb 9.6 oz (97.3 kg)    GENERAL:alert, no distress and comfortable.  She is mildly overweight SKIN: skin color, texture, turgor are normal, no rashes or significant lesions EYES: normal, conjunctiva are pink and non-injected, sclera clear OROPHARYNX:no exudate, no erythema and lips, buccal mucosa, and tongue normal  NECK: supple, thyroid normal size, non-tender, without nodularity LYMPH:  no palpable lymphadenopathy in the cervical, axillary or inguinal LUNGS: clear to auscultation and percussion with normal breathing effort HEART: regular rate & rhythm and no murmurs and no lower extremity edema ABDOMEN:abdomen soft, non-tender and normal bowel sounds Musculoskeletal:no cyanosis of digits and no clubbing  PSYCH: alert & oriented x 3 with fluent speech NEURO: no focal motor/sensory deficits  LABORATORY DATA:  I  have reviewed the data as listed Lab Results  Component Value Date   WBC 11.0 (H) 05/20/2018   HGB 13.2 05/20/2018   HCT 39.4 05/20/2018   MCV 93.6 05/20/2018   PLT 236 05/20/2018    RADIOGRAPHIC STUDIES: I have personally reviewed the radiological images as listed and agreed with the findings in the report. Ct Angio Chest Pe W Or Wo Contrast  Result Date: 05/18/2018 CLINICAL DATA:  Dyspnea for 4 weeks. EXAM: CT ANGIOGRAPHY CHEST WITH CONTRAST TECHNIQUE: Multidetector CT imaging of the chest was performed using the standard protocol during bolus administration of intravenous contrast. Multiplanar CT image reconstructions and MIPs were obtained to evaluate the vascular anatomy. CONTRAST:  100 cc Isovue 370 IV. COMPARISON:  12/02/2017 chest CT angiogram. FINDINGS: Cardiovascular: The study is high quality for the evaluation of pulmonary embolism. Acute pulmonary emboli are present at the bifurcation of the left pulmonary artery extending into the lobar, segmental and subsegmental pulmonary artery branches of the left upper and left lower lobes. Acute lobar and segmental pulmonary emboli are present in the right upper, right middle and right lower lobes. No saddle pulmonary embolus. Atherosclerotic thoracic aorta with stable ectatic 4.2 cm ascending thoracic aorta. Dilated main pulmonary artery (3.5 cm diameter), stable. Normal heart size. No significant pericardial fluid/thickening. Elevated RV/LV ratio 1.4. Mediastinum/Nodes: No discrete thyroid nodules. Unremarkable esophagus. No pathologically enlarged axillary, mediastinal or hilar lymph nodes. Lungs/Pleura: No pneumothorax. No pleural effusion. Left upper lobe solid 4 mm pulmonary nodule (series 12/image 19), stable since 12/02/2017, probably benign. No acute consolidative airspace disease, lung masses or new significant pulmonary nodules. Upper abdomen: Cholecystectomy. Musculoskeletal: No aggressive appearing focal osseous lesions. Moderate  thoracic spondylosis. Soft tissue anchors are noted in the right humeral head. Review of the MIP images confirms the above findings. IMPRESSION: 1. Acute large clot burden bilateral pulmonary embolism involving the bifurcation of the left pulmonary artery and the lobar, segmental and subsegmental pulmonary artery branches of all lung lobes. 2. Positive for acute PE with CT evidence of right  heart strain (RV/LV Ratio = 1.4) consistent with at least submassive (intermediate risk) PE. The presence of right heart strain has been associated with an increased risk of morbidity and mortality. 3. Dilated main pulmonary artery, stable since 12/02/2017, compatible with chronic pulmonary arterial hypertension. 4. No acute pulmonary disease. Solitary 4 mm left upper lobe pulmonary nodule is stable since 12/02/2017 chest CT, probably benign. No follow-up needed if patient is low-risk. Non-contrast chest CT can be considered in 12 months if patient is high-risk. This recommendation follows the consensus statement: Guidelines for Management of Incidental Pulmonary Nodules Detected on CT Images: From the Fleischner Society 2017; Radiology 2017; 284:228-243. Aortic Atherosclerosis (ICD10-I70.0). Critical Value/emergent results were called by telephone at the time of interpretation on 05/18/2018 at 3:18 pm to Dr. [name] , who verbally acknowledged these results. Electronically Signed   By: Ilona Sorrel M.D.   On: 05/18/2018 15:23    All questions were answered. The patient knows to call the clinic with any problems, questions or concerns. I spent 55 minutes counseling the patient face to face. The total time spent in the appointment was 60 minutes and more than 50% was on counseling.     Heath Lark, MD 05/29/2018 1:43 PM

## 2018-06-01 DIAGNOSIS — I10 Essential (primary) hypertension: Secondary | ICD-10-CM | POA: Diagnosis not present

## 2018-06-01 DIAGNOSIS — I2699 Other pulmonary embolism without acute cor pulmonale: Secondary | ICD-10-CM | POA: Diagnosis not present

## 2018-06-01 DIAGNOSIS — I82409 Acute embolism and thrombosis of unspecified deep veins of unspecified lower extremity: Secondary | ICD-10-CM | POA: Diagnosis not present

## 2018-06-05 DIAGNOSIS — I2699 Other pulmonary embolism without acute cor pulmonale: Secondary | ICD-10-CM | POA: Diagnosis not present

## 2018-06-05 DIAGNOSIS — I10 Essential (primary) hypertension: Secondary | ICD-10-CM | POA: Diagnosis not present

## 2018-06-05 DIAGNOSIS — Z7901 Long term (current) use of anticoagulants: Secondary | ICD-10-CM | POA: Diagnosis not present

## 2018-06-05 DIAGNOSIS — Z6841 Body Mass Index (BMI) 40.0 and over, adult: Secondary | ICD-10-CM | POA: Diagnosis not present

## 2018-06-05 DIAGNOSIS — K5909 Other constipation: Secondary | ICD-10-CM | POA: Diagnosis not present

## 2018-06-05 DIAGNOSIS — R1084 Generalized abdominal pain: Secondary | ICD-10-CM | POA: Diagnosis not present

## 2018-06-08 ENCOUNTER — Ambulatory Visit
Admission: RE | Admit: 2018-06-08 | Discharge: 2018-06-08 | Disposition: A | Discharge status: Home / Self Care | Payer: Medicare Other | Source: Ambulatory Visit | Attending: Internal Medicine | Admitting: Internal Medicine

## 2018-06-08 DIAGNOSIS — Z1231 Encounter for screening mammogram for malignant neoplasm of breast: Secondary | ICD-10-CM

## 2018-06-12 NOTE — Progress Notes (Signed)
[name] was seen today in the movement disorders clinic for neurologic consultation at the request of [name], MD.  The consultation is for the evaluation of tremor.  The records that were made available to me were reviewed.  Pt is R hand dominant.  Pt reports tremor for 20-30 years, but was mild when it started.  It started in both hands.  Her father had the same thing, as did her sister and her son now has the same thing.  It didn't really start to bother her until 5-6 years ago.  She started on primidone at that time, 50 mg and it helped.  It was increased to 100 mg and it helped as well.  No SE with the medication.  Over the last few years, tremor has continued to pick up, and she is noticing it in the legs now as well.  She notices that anxiety/stress will increase the tremor.   In January, the patient ran out of her primidone for 4-5 days and she really noted a deterioration in the tremor.  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  She does not necessarily doze unexpectedly, but is very sleepy during the day.  She has an appointment with [name] to discuss this.   07/29/14 update:  Last visit, I increased the patients primidone to 50 mg in the AM and she is on 100 mg at night.  She is doing better in terms of tremor.  She has good days and bad days and she knows that stress makes it worse.  She did see [name] since last visit and I reviewed those notes.  She had a PSG.  She had an AHI of 60.6 but this was all in supine position and AHI in lateral position was 0.0 (but I was unsure of how much time, if any, was spent in the lateral position).  CPAP at 6 was recommended but she admits that she "hasn't given in to that" and wants to talk with Dr. [name].    01/29/15 update:  Pt returns today.  She had called and wanted to try to increase the primidone to 100 mg bid but it made her too sleepy.  She is therefore just on 2 at night.  She generally is doing  okay, but her husband is physically not well and that stress will increase tremor.  Husband with dementia and she is caregiving.  She is seeking out advice from psychologist.  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  The records that were made available to me were reviewed.  Less sleepy during the day with CPAP.    08/01/15 update:  The patient returns today for follow-up.  I have reviewed records since her last visit.  She has a history of essential tremor.  She is currently on primidone, 100 mg at night.  Daytime dosages have caused sleepiness.  She did follow-up with [name] on 04/14/2015 in regards to her sleep apnea.  She has been very compliant with this.  She reports that she is doing worse.  She states that she can hardly get food in the mouth at times.  She has no asthma.  She states that she has tried to relieve stress (primary caregiver for her husband who is ill) through painting and when she tried to sign the painting today, she couldn't do it.  She has never been on a beta blocker.  She does state  that she has been ill recently and is on an aquatic's, but she is not on any prednisone.  11/04/15 update:  The patient is following up today regarding her tremor.  I have reviewed records since our last visit.  She is on primidone, 100 mg at night.  Last visit, I cautiously started her on Inderal LA, 80 mg daily.  I told her to call me with any side effects or problems.  I have not heard from her, but it appears that she has been in the emergency room multiple times.  She went to the emergency room on 08/11/2015 after a fall on her arm.  She states that with this one she misstepped.  A CT of the brain was performed and was negative.   A few days later she twisted wrong in the kitchen and fell.   On 08/24/2015 she went back to the emergency room with another fall.  She fell in the kitchen and hit her head on the dishwasher.  Her scalp wound was repaired with sutures and staples and she was advised to discontinue  her Inderal.  Of note was that her pulse was 66 in the emergency room and blood pressure was 152/72.  She followed up with her primary care physician and had an MRI of the brain on 09/07/2015.  I had the opportunity to review this.  It was nonacute.  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  Since those falls, she has gone to PT and gone to the gym.  She loved the PT but she states that sx's are so erratic that she may not know it was going to occur.  She went back to the emergency room on 08/28/2015 with complaints of knee pain, after her right leg gave out while walking in a restaurant.  She had one other fall where she tripped over a throw rug at a friends house.  She attributes the last 2 falls to back pain.    She saw [name] on 10/02/2015 with excessive daytime hypersomnolence.  [name] interrogated her machine and she was very compliant with her CPAP and there was no significant mask leak.  [name] suggested perhaps decreasing her Inderal and also started her on Provigil, 100 mg daily.  The patient didn't try the provigil yet as she was worried about SE that [name] talked to her about.  Separate from all of this, she c/o sweats.  She is unsure of how long she has been on cymbalta or how long the sweats have been.  Her pharmacist suggested that the sweats may have been from propranolol.  02/04/16 update:  The patient follows up today for essential tremor.  She remains on primidone, 100 mg at night.  Daytime dosages have made her sleepy, so she is only on nighttime dosages.  Last visit, I changed her from Inderal LA, 80 mg to propranolol 20 mg twice a day, primarily because she started falling after I added the Inderal LA.  She called me not long after I did that to let me know that while she was no longer falling, she could not tolerate the tremor and wanted to go back up on the medication.  I was very leery about this, but  we ultimately told her she can go up to 20 mg 3 times a day.  We called her shortly thereafter and she stated that she was only doing 3 times a  day as needed.  She states today, however, that she is only taking 20 mg at night unless she has a "lunch date" and then she may take an extra during the day.   Last visit, she was also complaining about significant diaphoresis and I told her that it could be the Cymbalta and told her that perhaps she could discuss this with her prescribing physician.  She actually thinks that lowering the propranolol helped the sweating but she is still sweating (but states that weather is bit cooler).  Unfortunately, despite the change in medication, she has had a few more falls.  The last fall was a week ago.  She had her cane and was coming out of the store and she "stubbed" her foot and fell.  No dizziness associated with the falls.  She is in her 2nd session of PT in [place].    09/02/16 update:  Pt f/u today.  This patient is accompanied in the office by her daughter who supplements the history.  The records that were made available to me were reviewed since last visit.  On primidone - 100 mg q hs but she d/c her propranolol since last visit.  Last visit, we discussed EMG for peripheral neuropathy and the patient called back the day after the last visit and wanted to schedule that but she ultimately cancelled the appt.  She fell in March and the only other fall was on the grass a few weeks ago at church as she was on uneven terrain.  She continues to caregive and is stressed but is seeing her counselor.  Reports that in April she had a DVT and is now on eloquis.  Not long after starting on eloquis, she noted a sharp shooting pain in the L face and L eye.  States that she saw PCP and did not have suspicion for temporal arteritis.  It generally has gotten better and is not coming often now.  She cannot remember the last time that she had that.  The patient had an MRI of the brain on  08/31/16 that I had the opportunity to review.  There was significant/severe white matter disease but it had not changed since 2016.  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  I reviewed this with pt/daughter.  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  She states that she thinks that last time she had angiogram was at her AVM coiling in the 1990's at [facility].    Still worries about her memory but does think that stress is affecting that.  Husband with dementia.  Looking into moving into assisted living.  12/14/16 update:  Patient f/u today.  Last visit, I did have to stop her primidone, just while she was on Eliquis.  She is now off of the Eliquis and restarted her primidone.  "Boy am I glad to be back on that."  She is back on 100 mg at night of the primidone.  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  She was very worried about memory  change last visit and subsequently underwent neuropsych testing on 09/22/2016.  She has seen Dr. Si Raider for follow-up and results.  As suspected, there was no evidence of dementia.  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  States that she was so glad that she did that as she just needed confirmation that she did not have dementia.  States that she has colitis and was told that it was from anxiety.  Had diarrhea x 4-5 weeks.  Getting ready to move to [facility] at independent assisted living and that has been stressful.  Husbands health declining  both physically and mentally.  Pt is seeing counselor, Jackelyn [facility]hn at [facility].  When asked if lexapro was helping, she states that her psychologist recommended a change but she thinks that changing previously didn't help so she is Chiropodist.  She does think that the lexapro is helping.  She was previously on effexor.  Wellbutrin caused SE.  Been on cymbalta in the past but caused sweats.    04/13/17 update:  Pt seen in f/u today.  On primidone 100 mg q hs and stable in regards to tremor.  In regards to falls states that she is doing well.  Moved to [place] and moved to independent living. Was a very stressful move for her and caused anxiety/depression for her.  Feels that she is adjusting better.  Still seeing Jackelyn Hoehn and he suggested he suggested talking to Dr. [name] and Trintellix was started.  It helped.  It did cause itching but patient states it is intermittent and she is trying to tolerate that.   Developed C. Diff in Jan (complained last visit of diarrhea but hadn't yet been diagnosed).    09/02/17 update:  Patient seen today in follow-up for essential tremor.  The records that were made available to me were reviewed.    I increased her primidone last visit cautiously and slowly because she had had hypersomnolence with that in the past.  My goal is to get her up to 50 mg in the morning and 100 mg at night.  She states that this just made her too sleepy and she ended up going back to 2 tablets at night.  She is using her walker at all times.  She is still seeing Jackelyn Hoehn for counseling for depression.  States that she is still adjusting to moving to [place] but she is doing somewhat better.  States that she has had a lot of anxiety.  Lots going on in personal life and finds herself worried about that.  Memory isn't as good.  She is faithful with CPAP but still has EDS.  Did fall with her husband at [employer].  They made each other fall.  She fell in the living room last week because she  tripped over her husbands shoes.    01/31/18 update: Patient is seen today in follow-up for essential tremor.  I have reviewed records made available to me.  She is currently on primidone, 150 mg at night (unable to tolerate daytime dosages).  She reports that she isn't sure how much she is taking as son was doing all medications.  She thinks that she is only taking 2 and "if I am taking 2 and can take 3, I would like to."  Cannot write name due to tremor.  She is exercising some.    06/13/18 update: Patient is seen today in follow-up for essential tremor.  I have reviewed records made available to  me.  Patient is on primidone, 50 mg, 3 tablets at night.  She is also on propranolol, 20 mg twice per day.  "My tremor is under good control."  Records are reviewed since our last visit.  Patient was in the hospital from May 18, 2018 to May 20, 2018.  Patient was having worsening shortness of breath and found to have a blood clot in the right leg.  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  She thinks that the most recent episode of DVT appeared to have been provoked by poor mobility and possible dehydration.  She is currently on Eliquis.  She states that she has had several UTI's since our last visit and has seen Dr. Vikki Ports.  She is doing better in that regard.  She had 2 falls in the same parking lot 2 weeks apart.  She was stepping off the curb both times.  She landed on her bottom.     ALLERGIES:   Allergies  Allergen Reactions  . Buprenorphine Hcl Nausea And Vomiting  . Codeine Nausea And Vomiting    Other reaction(s): Other (See Comments) But tolerates Tramadol But tolerates Tramadol Vomiting   . Morphine And Related Nausea And Vomiting  . Morphine     Other reaction(s): Other (See Comments) Vomiting  . Procaine     Other reaction(s): Other (See Comments) Other reaction(s): Shakes Novacaine-Shakes    CURRENT MEDICATIONS:   Current Outpatient Medications:  .   apixaban (ELIQUIS) 5 MG TABS tablet, Take 10 mg (Two 5 mg Tablets) po BID for 7 days and then Take 5 mg po BID onwards, Disp: 70 tablet, Rfl: 0 .  aspirin EC 81 MG tablet, Take 81 mg by mouth at bedtime., Disp: , Rfl:  .  busPIRone (BUSPAR) 10 MG tablet, Take 10 mg by mouth daily., Disp: , Rfl:  .  calcium citrate-vitamin D (CITRACAL+D) 315-200 MG-UNIT tablet, Take 2 tablets by mouth at bedtime., Disp: , Rfl:  .  Cholecalciferol (VITAMIN D3) 2000 units capsule, Take 1 capsule by mouth at bedtime., Disp: , Rfl:  .  escitalopram (LEXAPRO) 10 MG tablet, Take 10 mg by mouth daily., Disp: , Rfl:  .  esomeprazole (NEXIUM) 40 MG capsule, Take 40 mg by mouth at bedtime. , Disp: , Rfl:  .  famotidine (PEPCID) 20 MG tablet, Take 20 mg by mouth at bedtime as needed for heartburn or indigestion., Disp: , Rfl:  .  furosemide (LASIX) 20 MG tablet, , Disp: , Rfl:  .  lidocaine (LIDODERM) 5 %, Place 1 patch onto the skin daily. Remove & Discard patch within 12 hours or as directed by MD, Disp: 14 patch, Rfl: 0 .  loratadine (CLARITIN) 10 MG tablet, Take 10 mg by mouth at bedtime., Disp: , Rfl:  .  mirabegron ER (MYRBETRIQ) 50 MG TB24 tablet, Take 50 mg by mouth daily. , Disp: , Rfl:  .  olmesartan (BENICAR) 20 MG tablet, Take 20 mg by mouth daily., Disp: , Rfl:  .  primidone (MYSOLINE) 50 MG tablet, TAKE THREE TABLETS BY MOUTH AT BEDTIME, Disp: 90 tablet, Rfl: 5 .  propranolol (INDERAL) 20 MG tablet, Take 1 tablet (20 mg total) by mouth 2 (two) times daily., Disp: 180 tablet, Rfl: 1 .  saccharomyces boulardii (FLORASTOR) 250 MG capsule, Take 250 mg by mouth 2 (two) times daily., Disp: , Rfl:  .  TOVIAZ 8 MG TB24 tablet, , Disp: , Rfl:  .  trimethoprim (TRIMPEX) 100 MG  tablet, Take 100 mg by mouth daily. , Disp: , Rfl:    PAST MEDICAL HISTORY:   Past Medical History:  Diagnosis Date  . 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 Benign familial tremor   . Clostridium difficile infection   . DDD  (degenerative disc disease), lumbar   . Diverticulosis   . DVT (deep venous thrombosis) (HCC)    left leg  . Gallstones   . GERD (gastroesophageal reflux disease)   . Hypertension   . Osteoporosis   . Wears glasses     PAST SURGICAL HISTORY:   Past Surgical History:  Procedure Laterality Date  . ABDOMINAL HYSTERECTOMY  1983  . arterial vascular fistula  2004   coiled at [place]  [phone]  . CARPAL TUNNEL RELEASE  right  . CHOLECYSTECTOMY N/A 03/29/2013   Procedure: LAPAROSCOPIC CHOLECYSTECTOMY WITH INTRAOPERATIVE CHOLANGIOGRAM;  Surgeon: Haywood Lasso, MD;  Location: [facility];  Service: General;  Laterality: N/A;  . EUS N/A 03/09/2013   Procedure: UPPER ENDOSCOPIC ULTRASOUND (EUS) LINEAR;  Surgeon: Beryle Beams, MD;  Location: [facility];  Service: Endoscopy;  Laterality: N/A;  . LUMBAR LAMINECTOMY/DECOMPRESSION MICRODISCECTOMY  11/30/2012   Procedure: LUMBAR LAMINECTOMY/DECOMPRESSION MICRODISCECTOMY 2 LEVELS;  Surgeon: Magnus Sinning, MD;  Location: [facility];  Service: Orthopedics;  Laterality: N/A;  DECOMPRESSION LAMINECTOMY L4-L5, L5-S1 CENTRAL   . ROTATOR CUFF REPAIR Bilateral 2004   two on right, one on left  . TRIGGER FINGER RELEASE Right     hand    SOCIAL HISTORY:   Social History   Socioeconomic History  . Marital status: Married    Spouse name: Richard  . Number of children: 2  . Years of education: 37  . Highest education level: Not on file  Occupational History  . 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 Financial resource strain: Not on file  . Food insecurity:    Worry: Not on file    Inability: Not on file  . Transportation needs:    Medical: Not on file    Non-medical: Not on file  Tobacco Use  . Smoking status: Former Smoker    Packs/day: 0.50    Types: Cigarettes    Last attempt to quit: 07/11/1970    Years since quitting: 47.9  . Smokeless tobacco: Never Used    Substance and Sexual Activity  . Alcohol use: Yes    Alcohol/week: 0.0 oz    Comment: wine occasionally  . Drug use: No  . Sexual activity: Not Currently  Lifestyle  . Physical activity:    Days per week: Not on file    Minutes per session: Not on file  . Stress: Not on file  Relationships  . Social connections:    Talks on phone: Not on file    Gets together: Not on file    Attends religious service: Not on file    Active member of club or organization: Not on file    Attends meetings of clubs or organizations: Not on file    Relationship status: Not on file  . Intimate partner violence:    Fear of current or ex partner: Not on file    Emotionally abused: Not on file    Physically abused: Not on file    Forced sexual activity: Not on file  Other Topics Concern  . Not on file  Social History Narrative   Patient is  right handed, resides with spouse in a home.   1 cup of coffee a day     FAMILY HISTORY:   Family Status  Relation Name Status  . Mother  Deceased at age 75       sarcoma  . Father  Deceased       complications from broken hip, ET  . Sister  Deceased       blood clot, ET  . Son  Alive       healthy, [place]  . Daughter  Alive       healthy    ROS:  Review of Systems  Constitutional: Negative.   HENT: Negative.   Cardiovascular: Negative.   Genitourinary: Negative.   Musculoskeletal: Positive for falls.  Skin: Negative.     PHYSICAL EXAMINATION:    VITALS:   Vitals:   06/13/18 1111  BP: (!) 148/80  Pulse: 72  SpO2: 93%  Weight: 217 lb 2 oz (98.5 kg)  Height: 4\' 11"  (1.499 m)    GEN:  The patient appears stated age and is in NAD. HEENT:  Normocephalic, atraumatic.  The mucous membranes are moist. The superficial temporal arteries are without ropiness or tenderness. CV:  RRR Lungs:  CTAB Neck/HEME:  There are no carotid bruits bilaterally.  Neurological examination:  Orientation: The patient is alert and oriented x3. Cranial nerves:  There is good facial symmetry. The speech is fluent and clear. Soft palate rises symmetrically and there is no tongue deviation. Hearing is intact to conversational tone. Sensation: Sensation is intact to light touch throughout Motor: Strength is 5/5 in the bilateral upper and lower extremities.   Shoulder shrug is equal and symmetric.  There is no pronator drift.  Movement examination: Tone: There is normal tone in the bilateral upper extremities.  The tone in the lower extremities is normal.  Abnormal movements: There is no resting tremor today.  There is postural tremor, more on the L than the R.  There is intention tremor more on the L than the R.   Coordination:  There is no decremation, with any form of RAMS, including alternating supination and pronation of the forearm, hand opening and closing, finger taps, heel taps and toe taps bilaterally. Gait and Station: The patient has a wide based gait and slightly antalgic with the walker  ASSESSMENT/PLAN:  1.  Essential tremor.  -long discussion with the patient today.  Was started on eliquis since last visit.  Primidone cannot be taken with this.  Will need to wean off.  Was agreeable.  Discussed tremor may increase and no real alternatives  -continue Propranolol, 20 mg bid  -has evidence of peripheral neuropathy.  Cancelled her EMG in the past. 3.  Hx AVM repair with coiling  -She does have an upgoing toe on the right, which I think is likely just a result of her history of AVM, which she reports was on the left.  I see nothing else focal or lateralizing on her examination today, so decided to hold off on any further neuroimaging.    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 4.  Trigeminal neuralgia  -no longer having sx 4.   OSAS  -On CPAP faithfully but still with some EDS.  I think likely related to depression 5  Anxiety and depression  -still  having trouble with adjusting to move to [place], which has been ongoing issue.  Doing better than in the past.   -asks again about husbands care, which I think contributes to anxiety /depression  -She had neuropsych testing on 09/22/2016.   As suspected, there was no evidence of dementia.  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  Talked about whether or not to repeat given c/o memory change but I still think that this is just increase in anxiety/depression.  Can reconsider in future. 6.  Recurrent DVT/PE  -seeing Dr. Alvy Bimler  -on eliquis 7.  F/u 4 months

## 2018-06-13 ENCOUNTER — Encounter: Payer: Self-pay | Admitting: Neurology

## 2018-06-13 ENCOUNTER — Ambulatory Visit (INDEPENDENT_AMBULATORY_CARE_PROVIDER_SITE_OTHER): Payer: Medicare Other | Admitting: Neurology

## 2018-06-13 VITALS — BP 148/80 | HR 72 | Ht 59.0 in | Wt 217.1 lb

## 2018-06-13 DIAGNOSIS — I2609 Other pulmonary embolism with acute cor pulmonale: Secondary | ICD-10-CM | POA: Diagnosis not present

## 2018-06-13 DIAGNOSIS — F411 Generalized anxiety disorder: Secondary | ICD-10-CM

## 2018-06-13 DIAGNOSIS — G250 Essential tremor: Secondary | ICD-10-CM

## 2018-06-13 NOTE — Patient Instructions (Signed)
1.  Decrease primidone - 50 mg - 2 tablets at night for a week, then 1 tablet at night for a week and then STOP the primidone 2.  You can continue the propranolol 20 mg twice per day

## 2018-06-27 DIAGNOSIS — G250 Essential tremor: Secondary | ICD-10-CM | POA: Diagnosis not present

## 2018-06-27 DIAGNOSIS — N3281 Overactive bladder: Secondary | ICD-10-CM | POA: Diagnosis not present

## 2018-06-27 DIAGNOSIS — Z6841 Body Mass Index (BMI) 40.0 and over, adult: Secondary | ICD-10-CM | POA: Diagnosis not present

## 2018-06-27 DIAGNOSIS — Z7901 Long term (current) use of anticoagulants: Secondary | ICD-10-CM | POA: Diagnosis not present

## 2018-06-27 DIAGNOSIS — I2699 Other pulmonary embolism without acute cor pulmonale: Secondary | ICD-10-CM | POA: Diagnosis not present

## 2018-06-27 DIAGNOSIS — K5909 Other constipation: Secondary | ICD-10-CM | POA: Diagnosis not present

## 2018-07-25 ENCOUNTER — Other Ambulatory Visit: Payer: Self-pay | Admitting: Neurology

## 2018-08-04 DIAGNOSIS — M79671 Pain in right foot: Secondary | ICD-10-CM | POA: Diagnosis not present

## 2018-08-04 DIAGNOSIS — M7741 Metatarsalgia, right foot: Secondary | ICD-10-CM | POA: Diagnosis not present

## 2018-08-04 DIAGNOSIS — M2021 Hallux rigidus, right foot: Secondary | ICD-10-CM | POA: Diagnosis not present

## 2018-08-07 DIAGNOSIS — H524 Presbyopia: Secondary | ICD-10-CM | POA: Diagnosis not present

## 2018-08-07 DIAGNOSIS — H52203 Unspecified astigmatism, bilateral: Secondary | ICD-10-CM | POA: Diagnosis not present

## 2018-08-07 DIAGNOSIS — H3500 Unspecified background retinopathy: Secondary | ICD-10-CM | POA: Diagnosis not present

## 2018-10-06 DIAGNOSIS — N3020 Other chronic cystitis without hematuria: Secondary | ICD-10-CM | POA: Diagnosis not present

## 2018-10-06 DIAGNOSIS — R1032 Left lower quadrant pain: Secondary | ICD-10-CM | POA: Diagnosis not present

## 2018-10-06 DIAGNOSIS — N3946 Mixed incontinence: Secondary | ICD-10-CM | POA: Diagnosis not present

## 2018-10-09 DIAGNOSIS — M7741 Metatarsalgia, right foot: Secondary | ICD-10-CM | POA: Diagnosis not present

## 2018-10-10 DIAGNOSIS — Z23 Encounter for immunization: Secondary | ICD-10-CM | POA: Diagnosis not present

## 2018-10-20 DIAGNOSIS — M150 Primary generalized (osteo)arthritis: Secondary | ICD-10-CM | POA: Diagnosis not present

## 2018-10-20 DIAGNOSIS — M48061 Spinal stenosis, lumbar region without neurogenic claudication: Secondary | ICD-10-CM | POA: Diagnosis not present

## 2018-10-20 DIAGNOSIS — M6281 Muscle weakness (generalized): Secondary | ICD-10-CM | POA: Diagnosis not present

## 2018-10-20 DIAGNOSIS — R2689 Other abnormalities of gait and mobility: Secondary | ICD-10-CM | POA: Diagnosis not present

## 2018-10-26 DIAGNOSIS — M150 Primary generalized (osteo)arthritis: Secondary | ICD-10-CM | POA: Diagnosis not present

## 2018-10-26 DIAGNOSIS — R2689 Other abnormalities of gait and mobility: Secondary | ICD-10-CM | POA: Diagnosis not present

## 2018-10-26 DIAGNOSIS — M6281 Muscle weakness (generalized): Secondary | ICD-10-CM | POA: Diagnosis not present

## 2018-10-26 DIAGNOSIS — M48061 Spinal stenosis, lumbar region without neurogenic claudication: Secondary | ICD-10-CM | POA: Diagnosis not present

## 2018-10-30 DIAGNOSIS — R2689 Other abnormalities of gait and mobility: Secondary | ICD-10-CM | POA: Diagnosis not present

## 2018-10-30 DIAGNOSIS — M150 Primary generalized (osteo)arthritis: Secondary | ICD-10-CM | POA: Diagnosis not present

## 2018-10-30 DIAGNOSIS — M6281 Muscle weakness (generalized): Secondary | ICD-10-CM | POA: Diagnosis not present

## 2018-10-30 DIAGNOSIS — M48061 Spinal stenosis, lumbar region without neurogenic claudication: Secondary | ICD-10-CM | POA: Diagnosis not present

## 2018-11-02 DIAGNOSIS — M150 Primary generalized (osteo)arthritis: Secondary | ICD-10-CM | POA: Diagnosis not present

## 2018-11-02 DIAGNOSIS — R2689 Other abnormalities of gait and mobility: Secondary | ICD-10-CM | POA: Diagnosis not present

## 2018-11-02 DIAGNOSIS — M6281 Muscle weakness (generalized): Secondary | ICD-10-CM | POA: Diagnosis not present

## 2018-11-02 DIAGNOSIS — M48061 Spinal stenosis, lumbar region without neurogenic claudication: Secondary | ICD-10-CM | POA: Diagnosis not present

## 2018-11-02 NOTE — Progress Notes (Deleted)
[name] was seen today in the movement disorders clinic for neurologic consultation at the request of [name], MD.  The consultation is for the evaluation of tremor.  The records that were made available to me were reviewed.  Pt is R hand dominant.  Pt reports tremor for 20-30 years, but was mild when it started.  It started in both hands.  Her father had the same thing, as did her sister and her son now has the same thing.  It didn't really start to bother her until 5-6 years ago.  She started on primidone at that time, 50 mg and it helped.  It was increased to 100 mg and it helped as well.  No SE with the medication.  Over the last few years, tremor has continued to pick up, and she is noticing it in the legs now as well.  She notices that anxiety/stress will increase the tremor.   In January, the patient ran out of her primidone for 4-5 days and she really noted a deterioration in the tremor.  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  She does not necessarily doze unexpectedly, but is very sleepy during the day.  She has an appointment with [name] to discuss this.   07/29/14 update:  Last visit, I increased the patients primidone to 50 mg in the AM and she is on 100 mg at night.  She is doing better in terms of tremor.  She has good days and bad days and she knows that stress makes it worse.  She did see [name] since last visit and I reviewed those notes.  She had a PSG.  She had an AHI of 60.6 but this was all in supine position and AHI in lateral position was 0.0 (but I was unsure of how much time, if any, was spent in the lateral position).  CPAP at 6 was recommended but she admits that she "hasn't given in to that" and wants to talk with Dr. [name].    01/29/15 update:  Pt returns today.  She had called and wanted to try to increase the primidone to 100 mg bid but it made her too sleepy.  She is therefore just on 2 at night.  She generally is doing  okay, but her husband is physically not well and that stress will increase tremor.  Husband with dementia and she is caregiving.  She is seeking out advice from psychologist.  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  The records that were made available to me were reviewed.  Less sleepy during the day with CPAP.    08/01/15 update:  The patient returns today for follow-up.  I have reviewed records since her last visit.  She has a history of essential tremor.  She is currently on primidone, 100 mg at night.  Daytime dosages have caused sleepiness.  She did follow-up with [name] on 04/14/2015 in regards to her sleep apnea.  She has been very compliant with this.  She reports that she is doing worse.  She states that she can hardly get food in the mouth at times.  She has no asthma.  She states that she has tried to relieve stress (primary caregiver for her husband who is ill) through painting and when she tried to sign the painting today, she couldn't do it.  She has never been on a beta blocker.  She does state  that she has been ill recently and is on an aquatic's, but she is not on any prednisone.  11/04/15 update:  The patient is following up today regarding her tremor.  I have reviewed records since our last visit.  She is on primidone, 100 mg at night.  Last visit, I cautiously started her on Inderal LA, 80 mg daily.  I told her to call me with any side effects or problems.  I have not heard from her, but it appears that she has been in the emergency room multiple times.  She went to the emergency room on 08/11/2015 after a fall on her arm.  She states that with this one she misstepped.  A CT of the brain was performed and was negative.   A few days later she twisted wrong in the kitchen and fell.   On 08/24/2015 she went back to the emergency room with another fall.  She fell in the kitchen and hit her head on the dishwasher.  Her scalp wound was repaired with sutures and staples and she was advised to discontinue  her Inderal.  Of note was that her pulse was 66 in the emergency room and blood pressure was 152/72.  She followed up with her primary care physician and had an MRI of the brain on 09/07/2015.  I had the opportunity to review this.  It was nonacute.  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  Since those falls, she has gone to PT and gone to the gym.  She loved the PT but she states that sx's are so erratic that she may not know it was going to occur.  She went back to the emergency room on 08/28/2015 with complaints of knee pain, after her right leg gave out while walking in a restaurant.  She had one other fall where she tripped over a throw rug at a friends house.  She attributes the last 2 falls to back pain.    She saw [name] on 10/02/2015 with excessive daytime hypersomnolence.  [name] interrogated her machine and she was very compliant with her CPAP and there was no significant mask leak.  [name] suggested perhaps decreasing her Inderal and also started her on Provigil, 100 mg daily.  The patient didn't try the provigil yet as she was worried about SE that [name] talked to her about.  Separate from all of this, she c/o sweats.  She is unsure of how long she has been on cymbalta or how long the sweats have been.  Her pharmacist suggested that the sweats may have been from propranolol.  02/04/16 update:  The patient follows up today for essential tremor.  She remains on primidone, 100 mg at night.  Daytime dosages have made her sleepy, so she is only on nighttime dosages.  Last visit, I changed her from Inderal LA, 80 mg to propranolol 20 mg twice a day, primarily because she started falling after I added the Inderal LA.  She called me not long after I did that to let me know that while she was no longer falling, she could not tolerate the tremor and wanted to go back up on the medication.  I was very leery about this, but  we ultimately told her she can go up to 20 mg 3 times a day.  We called her shortly thereafter and she stated that she was only doing 3 times a  day as needed.  She states today, however, that she is only taking 20 mg at night unless she has a "lunch date" and then she may take an extra during the day.   Last visit, she was also complaining about significant diaphoresis and I told her that it could be the Cymbalta and told her that perhaps she could discuss this with her prescribing physician.  She actually thinks that lowering the propranolol helped the sweating but she is still sweating (but states that weather is bit cooler).  Unfortunately, despite the change in medication, she has had a few more falls.  The last fall was a week ago.  She had her cane and was coming out of the store and she "stubbed" her foot and fell.  No dizziness associated with the falls.  She is in her 2nd session of PT in [place].    09/02/16 update:  Pt f/u today.  This patient is accompanied in the office by her daughter who supplements the history.  The records that were made available to me were reviewed since last visit.  On primidone - 100 mg q hs but she d/c her propranolol since last visit.  Last visit, we discussed EMG for peripheral neuropathy and the patient called back the day after the last visit and wanted to schedule that but she ultimately cancelled the appt.  She fell in March and the only other fall was on the grass a few weeks ago at church as she was on uneven terrain.  She continues to caregive and is stressed but is seeing her counselor.  Reports that in April she had a DVT and is now on eloquis.  Not long after starting on eloquis, she noted a sharp shooting pain in the L face and L eye.  States that she saw PCP and did not have suspicion for temporal arteritis.  It generally has gotten better and is not coming often now.  She cannot remember the last time that she had that.  The patient had an MRI of the brain on  08/31/16 that I had the opportunity to review.  There was significant/severe white matter disease but it had not changed since 2016.  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  I reviewed this with pt/daughter.  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  She states that she thinks that last time she had angiogram was at her AVM coiling in the 1990's at [facility].    Still worries about her memory but does think that stress is affecting that.  Husband with dementia.  Looking into moving into assisted living.  12/14/16 update:  Patient f/u today.  Last visit, I did have to stop her primidone, just while she was on Eliquis.  She is now off of the Eliquis and restarted her primidone.  "Boy am I glad to be back on that."  She is back on 100 mg at night of the primidone.  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  She was very worried about memory  change last visit and subsequently underwent neuropsych testing on 09/22/2016.  She has seen Dr. Si Raider for follow-up and results.  As suspected, there was no evidence of dementia.  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  States that she was so glad that she did that as she just needed confirmation that she did not have dementia.  States that she has colitis and was told that it was from anxiety.  Had diarrhea x 4-5 weeks.  Getting ready to move to [facility] at independent assisted living and that has been stressful.  Husbands health declining  both physically and mentally.  Pt is seeing counselor, Jackelyn [facility]hn at [facility].  When asked if lexapro was helping, she states that her psychologist recommended a change but she thinks that changing previously didn't help so she is Chiropodist.  She does think that the lexapro is helping.  She was previously on effexor.  Wellbutrin caused SE.  Been on cymbalta in the past but caused sweats.    04/13/17 update:  Pt seen in f/u today.  On primidone 100 mg q hs and stable in regards to tremor.  In regards to falls states that she is doing well.  Moved to [place] and moved to independent living. Was a very stressful move for her and caused anxiety/depression for her.  Feels that she is adjusting better.  Still seeing Jackelyn Hoehn and he suggested he suggested talking to Dr. [name] and Trintellix was started.  It helped.  It did cause itching but patient states it is intermittent and she is trying to tolerate that.   Developed C. Diff in Jan (complained last visit of diarrhea but hadn't yet been diagnosed).    09/02/17 update:  Patient seen today in follow-up for essential tremor.  The records that were made available to me were reviewed.    I increased her primidone last visit cautiously and slowly because she had had hypersomnolence with that in the past.  My goal is to get her up to 50 mg in the morning and 100 mg at night.  She states that this just made her too sleepy and she ended up going back to 2 tablets at night.  She is using her walker at all times.  She is still seeing Jackelyn Hoehn for counseling for depression.  States that she is still adjusting to moving to [place] but she is doing somewhat better.  States that she has had a lot of anxiety.  Lots going on in personal life and finds herself worried about that.  Memory isn't as good.  She is faithful with CPAP but still has EDS.  Did fall with her husband at [employer].  They made each other fall.  She fell in the living room last week because she  tripped over her husbands shoes.    01/31/18 update: Patient is seen today in follow-up for essential tremor.  I have reviewed records made available to me.  She is currently on primidone, 150 mg at night (unable to tolerate daytime dosages).  She reports that she isn't sure how much she is taking as son was doing all medications.  She thinks that she is only taking 2 and "if I am taking 2 and can take 3, I would like to."  Cannot write name due to tremor.  She is exercising some.    06/13/18 update: Patient is seen today in follow-up for essential tremor.  I have reviewed records made available to  me.  Patient is on primidone, 50 mg, 3 tablets at night.  She is also on propranolol, 20 mg twice per day.  "My tremor is under good control."  Records are reviewed since our last visit.  Patient was in the hospital from May 18, 2018 to May 20, 2018.  Patient was having worsening shortness of breath and found to have a blood clot in the right leg.  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  She thinks that the most recent episode of DVT appeared to have been provoked by poor mobility and possible dehydration.  She is currently on Eliquis.  She states that she has had several UTI's since our last visit and has seen Dr. Vikki Ports.  She is doing better in that regard.  She had 2 falls in the same parking lot 2 weeks apart.  She was stepping off the curb both times.  She landed on her bottom.    11/03/18 update: Patient is seen today in follow-up for essential tremor.  I took the patient off of primidone last visit because she had been placed on Eliquis for DVT/PE.  She is now just on propranolol, 20 mg twice per day for tremor.  ALLERGIES:   Allergies  Allergen Reactions  . Buprenorphine Hcl Nausea And Vomiting  . Codeine Nausea And Vomiting    Other reaction(s): Other (See Comments) But tolerates Tramadol But tolerates Tramadol Vomiting   . Morphine And Related Nausea And Vomiting    . Morphine     Other reaction(s): Other (See Comments) Vomiting  . Procaine     Other reaction(s): Other (See Comments) Other reaction(s): Shakes Novacaine-Shakes    CURRENT MEDICATIONS:   Current Outpatient Medications:  .  apixaban (ELIQUIS) 5 MG TABS tablet, Take 10 mg (Two 5 mg Tablets) po BID for 7 days and then Take 5 mg po BID onwards, Disp: 70 tablet, Rfl: 0 .  aspirin EC 81 MG tablet, Take 81 mg by mouth at bedtime., Disp: , Rfl:  .  busPIRone (BUSPAR) 10 MG tablet, Take 10 mg by mouth daily., Disp: , Rfl:  .  calcium citrate-vitamin D (CITRACAL+D) 315-200 MG-UNIT tablet, Take 2 tablets by mouth at bedtime., Disp: , Rfl:  .  Cholecalciferol (VITAMIN D3) 2000 units capsule, Take 1 capsule by mouth at bedtime., Disp: , Rfl:  .  escitalopram (LEXAPRO) 10 MG tablet, Take 10 mg by mouth daily., Disp: , Rfl:  .  esomeprazole (NEXIUM) 40 MG capsule, Take 40 mg by mouth at bedtime. , Disp: , Rfl:  .  famotidine (PEPCID) 20 MG tablet, Take 20 mg by mouth at bedtime as needed for heartburn or indigestion., Disp: , Rfl:  .  furosemide (LASIX) 20 MG tablet, , Disp: , Rfl:  .  lidocaine (LIDODERM) 5 %, Place 1 patch onto the skin daily. Remove & Discard patch within 12 hours or as directed by MD, Disp: 14 patch, Rfl: 0 .  loratadine (CLARITIN) 10 MG tablet, Take 10 mg by mouth at bedtime., Disp: , Rfl:  .  mirabegron ER (MYRBETRIQ) 50 MG TB24 tablet, Take 50 mg by mouth daily. , Disp: , Rfl:  .  olmesartan (BENICAR) 20 MG tablet, Take 20 mg by mouth daily., Disp: , Rfl:  .  primidone (MYSOLINE) 50 MG tablet, TAKE THREE TABLETS BY MOUTH AT BEDTIME, Disp: 90 tablet, Rfl: 5 .  propranolol (INDERAL) 20 MG tablet, TAKE ONE TABLET BY MOUTH TWICE A DAY, Disp: 180 tablet,  Rfl: 1 .  saccharomyces boulardii (FLORASTOR) 250 MG capsule, Take 250 mg by mouth 2 (two) times daily., Disp: , Rfl:  .  TOVIAZ 8 MG TB24 tablet, , Disp: , Rfl:  .  trimethoprim (TRIMPEX) 100 MG tablet, Take 100 mg by mouth  daily. , Disp: , Rfl:    PAST MEDICAL HISTORY:   Past Medical History:  Diagnosis Date  . 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 Benign familial tremor   . Clostridium difficile infection   . DDD (degenerative disc disease), lumbar   . Diverticulosis   . DVT (deep venous thrombosis) (HCC)    left leg  . Gallstones   . GERD (gastroesophageal reflux disease)   . Hypertension   . Osteoporosis   . Wears glasses     PAST SURGICAL HISTORY:   Past Surgical History:  Procedure Laterality Date  . ABDOMINAL HYSTERECTOMY  1983  . arterial vascular fistula  2004   coiled at [place]  [phone]  . CARPAL TUNNEL RELEASE  right  . CHOLECYSTECTOMY N/A 03/29/2013   Procedure: LAPAROSCOPIC CHOLECYSTECTOMY WITH INTRAOPERATIVE CHOLANGIOGRAM;  Surgeon: Haywood Lasso, MD;  Location: [facility];  Service: General;  Laterality: N/A;  . EUS N/A 03/09/2013   Procedure: UPPER ENDOSCOPIC ULTRASOUND (EUS) LINEAR;  Surgeon: Beryle Beams, MD;  Location: [facility];  Service: Endoscopy;  Laterality: N/A;  . LUMBAR LAMINECTOMY/DECOMPRESSION MICRODISCECTOMY  11/30/2012   Procedure: LUMBAR LAMINECTOMY/DECOMPRESSION MICRODISCECTOMY 2 LEVELS;  Surgeon: Magnus Sinning, MD;  Location: [facility];  Service: Orthopedics;  Laterality: N/A;  DECOMPRESSION LAMINECTOMY L4-L5, L5-S1 CENTRAL   . ROTATOR CUFF REPAIR Bilateral 2004   two on right, one on left  . TRIGGER FINGER RELEASE Right     hand    SOCIAL HISTORY:   Social History   Socioeconomic History  . Marital status: Married    Spouse name: Richard  . Number of children: 2  . Years of education: 28  . Highest education level: Not on file  Occupational History  . 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 Financial resource strain: Not on file  . Food insecurity:    Worry: Not on file    Inability: Not on file  . Transportation needs:    Medical: Not on file     Non-medical: Not on file  Tobacco Use  . Smoking status: Former Smoker    Packs/day: 0.50    Types: Cigarettes    Last attempt to quit: 07/11/1970    Years since quitting: 48.3  . Smokeless tobacco: Never Used  Substance and Sexual Activity  . Alcohol use: Yes    Alcohol/week: 0.0 standard drinks    Comment: wine occasionally  . Drug use: No  . Sexual activity: Not Currently  Lifestyle  . Physical activity:    Days per week: Not on file    Minutes per session: Not on file  . Stress: Not on file  Relationships  . Social connections:    Talks on phone: Not on file    Gets together: Not on file    Attends religious service: Not on file    Active member of club or organization: Not on file    Attends meetings of clubs or organizations: Not on file    Relationship status: Not on file  . Intimate partner violence:    Fear of current or ex partner: Not on file  Emotionally abused: Not on file    Physically abused: Not on file    Forced sexual activity: Not on file  Other Topics Concern  . Not on file  Social History Narrative   Patient is right handed, resides with spouse in a home.   1 cup of coffee a day     FAMILY HISTORY:   Family Status  Relation Name Status  . Mother  Deceased at age 100       sarcoma  . Father  Deceased       complications from broken hip, ET  . Sister  Deceased       blood clot, ET  . Son  Alive       healthy, [place]  . Daughter  Alive       healthy    ROS:  ROS  PHYSICAL EXAMINATION:    VITALS:   There were no vitals filed for this visit.  GEN:  The patient appears stated age and is in NAD. HEENT:  Normocephalic, atraumatic.  The mucous membranes are moist. The superficial temporal arteries are without ropiness or tenderness. CV:  RRR Lungs:  CTAB Neck/HEME:  There are no carotid bruits bilaterally.  Neurological examination:  Orientation: The patient is alert and oriented x3. Cranial nerves: There is good facial symmetry. The  speech is fluent and clear. Soft palate rises symmetrically and there is no tongue deviation. Hearing is intact to conversational tone. Sensation: Sensation is intact to light touch throughout Motor: Strength is 5/5 in the bilateral upper and lower extremities.   Shoulder shrug is equal and symmetric.  There is no pronator drift.  Movement examination: Tone: There is normal tone in the bilateral upper extremities.  The tone in the lower extremities is normal.  Abnormal movements: There is no resting tremor today.  There is postural tremor, more on the L than the R.  There is intention tremor more on the L than the R.   Coordination:  There is no decremation, with any form of RAMS, including alternating supination and pronation of the forearm, hand opening and closing, finger taps, heel taps and toe taps bilaterally. Gait and Station: The patient has a wide based gait and slightly antalgic with the walker  ASSESSMENT/PLAN:  1.  Essential tremor.  -***Off of primidone because had to go on Eliquis for DVT/PE.  -continue Propranolol, 20 mg bid  -has evidence of peripheral neuropathy.  Cancelled her EMG in the past. 3.  Hx AVM repair with coiling  -She does have an upgoing toe on the right, which I think is likely just a result of her history of AVM, which she reports was on the left.  I see nothing else focal or lateralizing on her examination today, so decided to hold off on any further neuroimaging.    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 4.  Trigeminal neuralgia  -no longer having sx 4.  OSAS  -On CPAP faithfully but still with some EDS.  I think likely related to depression 5  Anxiety and depression  -still having trouble with adjusting to move to [place], which has been  ongoing issue.  Doing better than in the past.   -asks again about husbands care, which I think contributes to anxiety /depression  -She had neuropsych testing on 09/22/2016.  As suspected, there was no evidence of dementia.  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  Talked about whether or not to repeat given c/o memory change but I still think that this is just increase in anxiety/depression.  Can reconsider in future. 6.  Recurrent DVT/PE  -seeing Dr. Alvy Bimler  -on eliquis 7.  ***

## 2018-11-03 ENCOUNTER — Ambulatory Visit: Payer: Medicare Other | Admitting: Neurology

## 2018-11-03 ENCOUNTER — Encounter: Payer: Self-pay | Admitting: Neurology

## 2018-11-03 DIAGNOSIS — Z029 Encounter for administrative examinations, unspecified: Secondary | ICD-10-CM

## 2018-11-06 DIAGNOSIS — M150 Primary generalized (osteo)arthritis: Secondary | ICD-10-CM | POA: Diagnosis not present

## 2018-11-06 DIAGNOSIS — M6281 Muscle weakness (generalized): Secondary | ICD-10-CM | POA: Diagnosis not present

## 2018-11-06 DIAGNOSIS — M48061 Spinal stenosis, lumbar region without neurogenic claudication: Secondary | ICD-10-CM | POA: Diagnosis not present

## 2018-11-06 DIAGNOSIS — R2689 Other abnormalities of gait and mobility: Secondary | ICD-10-CM | POA: Diagnosis not present

## 2018-11-08 DIAGNOSIS — M6281 Muscle weakness (generalized): Secondary | ICD-10-CM | POA: Diagnosis not present

## 2018-11-08 DIAGNOSIS — M150 Primary generalized (osteo)arthritis: Secondary | ICD-10-CM | POA: Diagnosis not present

## 2018-11-08 DIAGNOSIS — Z79899 Other long term (current) drug therapy: Secondary | ICD-10-CM | POA: Diagnosis not present

## 2018-11-08 DIAGNOSIS — R2689 Other abnormalities of gait and mobility: Secondary | ICD-10-CM | POA: Diagnosis not present

## 2018-11-08 DIAGNOSIS — M48061 Spinal stenosis, lumbar region without neurogenic claudication: Secondary | ICD-10-CM | POA: Diagnosis not present

## 2018-11-08 DIAGNOSIS — N390 Urinary tract infection, site not specified: Secondary | ICD-10-CM | POA: Diagnosis not present

## 2018-11-13 DIAGNOSIS — M150 Primary generalized (osteo)arthritis: Secondary | ICD-10-CM | POA: Diagnosis not present

## 2018-11-13 DIAGNOSIS — M48061 Spinal stenosis, lumbar region without neurogenic claudication: Secondary | ICD-10-CM | POA: Diagnosis not present

## 2018-11-13 DIAGNOSIS — R2689 Other abnormalities of gait and mobility: Secondary | ICD-10-CM | POA: Diagnosis not present

## 2018-11-13 DIAGNOSIS — M6281 Muscle weakness (generalized): Secondary | ICD-10-CM | POA: Diagnosis not present

## 2018-11-16 DIAGNOSIS — R2689 Other abnormalities of gait and mobility: Secondary | ICD-10-CM | POA: Diagnosis not present

## 2018-11-16 DIAGNOSIS — M6281 Muscle weakness (generalized): Secondary | ICD-10-CM | POA: Diagnosis not present

## 2018-11-16 DIAGNOSIS — M48061 Spinal stenosis, lumbar region without neurogenic claudication: Secondary | ICD-10-CM | POA: Diagnosis not present

## 2018-11-16 DIAGNOSIS — M150 Primary generalized (osteo)arthritis: Secondary | ICD-10-CM | POA: Diagnosis not present

## 2018-11-24 DIAGNOSIS — I82409 Acute embolism and thrombosis of unspecified deep veins of unspecified lower extremity: Secondary | ICD-10-CM | POA: Diagnosis not present

## 2018-11-24 DIAGNOSIS — Z6841 Body Mass Index (BMI) 40.0 and over, adult: Secondary | ICD-10-CM | POA: Diagnosis not present

## 2018-11-24 DIAGNOSIS — M545 Low back pain: Secondary | ICD-10-CM | POA: Diagnosis not present

## 2018-11-24 DIAGNOSIS — Z7901 Long term (current) use of anticoagulants: Secondary | ICD-10-CM | POA: Diagnosis not present

## 2018-11-24 DIAGNOSIS — R197 Diarrhea, unspecified: Secondary | ICD-10-CM | POA: Diagnosis not present

## 2018-11-28 DIAGNOSIS — A0472 Enterocolitis due to Clostridium difficile, not specified as recurrent: Secondary | ICD-10-CM | POA: Diagnosis not present

## 2018-11-28 DIAGNOSIS — Z79899 Other long term (current) drug therapy: Secondary | ICD-10-CM | POA: Diagnosis not present

## 2018-12-11 DIAGNOSIS — M7741 Metatarsalgia, right foot: Secondary | ICD-10-CM | POA: Diagnosis not present

## 2018-12-11 DIAGNOSIS — M2021 Hallux rigidus, right foot: Secondary | ICD-10-CM | POA: Diagnosis not present

## 2018-12-11 DIAGNOSIS — M545 Low back pain: Secondary | ICD-10-CM | POA: Diagnosis not present

## 2018-12-26 ENCOUNTER — Other Ambulatory Visit: Payer: Self-pay | Admitting: Neurology

## 2018-12-26 DIAGNOSIS — Z7901 Long term (current) use of anticoagulants: Secondary | ICD-10-CM | POA: Diagnosis not present

## 2018-12-26 DIAGNOSIS — G250 Essential tremor: Secondary | ICD-10-CM | POA: Diagnosis not present

## 2018-12-26 DIAGNOSIS — Z86711 Personal history of pulmonary embolism: Secondary | ICD-10-CM | POA: Diagnosis not present

## 2018-12-26 DIAGNOSIS — M545 Low back pain: Secondary | ICD-10-CM | POA: Diagnosis not present

## 2018-12-26 DIAGNOSIS — F3289 Other specified depressive episodes: Secondary | ICD-10-CM | POA: Diagnosis not present

## 2018-12-26 DIAGNOSIS — K5909 Other constipation: Secondary | ICD-10-CM | POA: Diagnosis not present

## 2018-12-26 DIAGNOSIS — R7301 Impaired fasting glucose: Secondary | ICD-10-CM | POA: Diagnosis not present

## 2018-12-26 DIAGNOSIS — Z6841 Body Mass Index (BMI) 40.0 and over, adult: Secondary | ICD-10-CM | POA: Diagnosis not present

## 2018-12-26 DIAGNOSIS — N3281 Overactive bladder: Secondary | ICD-10-CM | POA: Diagnosis not present

## 2018-12-26 DIAGNOSIS — I10 Essential (primary) hypertension: Secondary | ICD-10-CM | POA: Diagnosis not present

## 2019-01-02 DIAGNOSIS — Z6841 Body Mass Index (BMI) 40.0 and over, adult: Secondary | ICD-10-CM | POA: Diagnosis not present

## 2019-01-02 DIAGNOSIS — F3289 Other specified depressive episodes: Secondary | ICD-10-CM | POA: Diagnosis not present

## 2019-01-02 DIAGNOSIS — R2689 Other abnormalities of gait and mobility: Secondary | ICD-10-CM | POA: Diagnosis not present

## 2019-01-02 DIAGNOSIS — N3281 Overactive bladder: Secondary | ICD-10-CM | POA: Diagnosis not present

## 2019-01-02 DIAGNOSIS — G3184 Mild cognitive impairment, so stated: Secondary | ICD-10-CM | POA: Diagnosis not present

## 2019-01-04 ENCOUNTER — Other Ambulatory Visit: Payer: Self-pay | Admitting: Internal Medicine

## 2019-01-04 DIAGNOSIS — G3184 Mild cognitive impairment, so stated: Secondary | ICD-10-CM

## 2019-01-10 ENCOUNTER — Emergency Department (HOSPITAL_COMMUNITY)
Admission: EM | Admit: 2019-01-10 | Discharge: 2019-01-11 | Disposition: A | Discharge status: Home / Self Care | Payer: Medicare Other | Attending: Emergency Medicine | Admitting: Emergency Medicine

## 2019-01-10 ENCOUNTER — Other Ambulatory Visit: Payer: Self-pay

## 2019-01-10 ENCOUNTER — Encounter (HOSPITAL_COMMUNITY): Payer: Self-pay

## 2019-01-10 DIAGNOSIS — Z79899 Other long term (current) drug therapy: Secondary | ICD-10-CM | POA: Insufficient documentation

## 2019-01-10 DIAGNOSIS — I10 Essential (primary) hypertension: Secondary | ICD-10-CM | POA: Diagnosis not present

## 2019-01-10 DIAGNOSIS — Z7901 Long term (current) use of anticoagulants: Secondary | ICD-10-CM | POA: Insufficient documentation

## 2019-01-10 DIAGNOSIS — Z7982 Long term (current) use of aspirin: Secondary | ICD-10-CM | POA: Diagnosis not present

## 2019-01-10 DIAGNOSIS — R531 Weakness: Secondary | ICD-10-CM | POA: Insufficient documentation

## 2019-01-10 DIAGNOSIS — N390 Urinary tract infection, site not specified: Secondary | ICD-10-CM | POA: Diagnosis not present

## 2019-01-10 DIAGNOSIS — Z87891 Personal history of nicotine dependence: Secondary | ICD-10-CM | POA: Insufficient documentation

## 2019-01-10 DIAGNOSIS — Z9049 Acquired absence of other specified parts of digestive tract: Secondary | ICD-10-CM | POA: Insufficient documentation

## 2019-01-10 DIAGNOSIS — R0902 Hypoxemia: Secondary | ICD-10-CM | POA: Diagnosis not present

## 2019-01-10 LAB — CBC WITH DIFFERENTIAL/PLATELET
Abs Immature Granulocytes: 0.05 10*3/uL (ref 0.00–0.07)
Basophils Absolute: 0.1 10*3/uL (ref 0.0–0.1)
Basophils Relative: 1 %
EOS PCT: 1 %
Eosinophils Absolute: 0.1 10*3/uL (ref 0.0–0.5)
HCT: 34.4 % — ABNORMAL LOW (ref 36.0–46.0)
HEMOGLOBIN: 10.8 g/dL — AB (ref 12.0–15.0)
Immature Granulocytes: 0 %
LYMPHS PCT: 22 %
Lymphs Abs: 2.6 10*3/uL (ref 0.7–4.0)
MCH: 31.7 pg (ref 26.0–34.0)
MCHC: 31.4 g/dL (ref 30.0–36.0)
MCV: 100.9 fL — AB (ref 80.0–100.0)
MONO ABS: 1.5 10*3/uL — AB (ref 0.1–1.0)
MONOS PCT: 13 %
Neutro Abs: 7.2 10*3/uL (ref 1.7–7.7)
Neutrophils Relative %: 63 %
Platelets: 199 10*3/uL (ref 150–400)
RBC: 3.41 MIL/uL — AB (ref 3.87–5.11)
RDW: 13.6 % (ref 11.5–15.5)
WBC: 11.5 10*3/uL — AB (ref 4.0–10.5)
nRBC: 0 % (ref 0.0–0.2)

## 2019-01-10 LAB — CBG MONITORING, ED: Glucose-Capillary: 90 mg/dL (ref 70–99)

## 2019-01-10 LAB — I-STAT TROPONIN, ED: Troponin i, poc: 0 ng/mL (ref 0.00–0.08)

## 2019-01-10 MED ORDER — SODIUM CHLORIDE 0.9 % IV BOLUS
1000.0000 mL | Freq: Once | INTRAVENOUS | Status: AC
  Administered 2019-01-10: 1000 mL via INTRAVENOUS

## 2019-01-10 NOTE — Discharge Instructions (Addendum)
You have been seen today for generalized weakness. Please read and follow all provided instructions.   1. Medications: usual home medications 2. Treatment: take Keflex as prescribed, rest, drink plenty of fluids 3. Follow Up: Please follow up with your primary doctor in 2 days for discussion of your diagnoses and further evaluation after today's visit; if you do not have a primary care doctor use the resource guide provided to find one; Please return to the ER for any new or worsening symptoms. Please obtain all of your results from medical records or have your doctors office obtain the results - share them with your doctor - you should be seen at your doctors office. Call today to arrange your follow up.   Take medications as prescribed. Please review all of the medicines and only take them if you do not have an allergy to them. Return to the emergency room for worsening condition or new concerning symptoms. Follow up with your regular doctor. If you don't have a regular doctor use one of the numbers below to establish a primary care doctor.  Please be aware that if you are taking birth control pills, taking other prescriptions, ESPECIALLY ANTIBIOTICS may make the birth control ineffective - if this is the case, either do not engage in sexual activity or use alternative methods of birth control such as condoms until you have finished the medicine and your family doctor says it is OK to restart them. If you are on a blood thinner such as COUMADIN, be aware that any other medicine that you take may cause the coumadin to either work too much, or not enough - you should have your coumadin level rechecked in next 7 days if this is the case.  ?  It is also a possibility that you have an allergic reaction to any of the medicines that you have been prescribed - Everybody reacts differently to medications and while MOST people have no trouble with most medicines, you may have a reaction such as nausea, vomiting,  rash, swelling, shortness of breath. If this is the case, please stop taking the medicine immediately and contact your physician.  ?  You should return to the ER if you develop severe or worsening symptoms.   Emergency Department Resource Guide 1) Find a Doctor and Pay Out of Pocket Although you won't have to find out who is covered by your insurance plan, it is a good idea to ask around and get recommendations. You will then need to call the office and see if the doctor you have chosen will accept you as a new patient and what types of options they offer for patients who are self-pay. Some doctors offer discounts or will set up payment plans for their patients who do not have insurance, but you will need to ask so you aren't surprised when you get to your appointment.  2) Contact Your Local Health Department Not all health departments have doctors that can see patients for sick visits, but many do, so it is worth a call to see if yours does. If you don't know where your local health department is, you can check in your phone book. The CDC also has a tool to help you locate your state's health department, and many state websites also have listings of all of their local health departments.  3) Find a [facility] If your illness is not likely to be very severe or complicated, you may want to try a walk in clinic. These are  popping up all over the country in pharmacies, drugstores, and shopping centers. They're usually staffed by nurse practitioners or physician assistants that have been trained to treat common illnesses and complaints. They're usually fairly quick and inexpensive. However, if you have serious medical issues or chronic medical problems, these are probably not your best option.  No Primary Care Doctor: Call Health Connect at  [phone] - they can help you locate a primary care doctor that  accepts your insurance, provides certain services, etc. Physician Referral [phone]  Emergency Department Resource Guide 1) Find a Doctor and Pay Out of Pocket Although you won't have to find out who is covered by your insurance plan, it is a good idea to ask around and get recommendations. You will then need to call the office and see if the doctor you have chosen will accept you as a new patient and what types of options they offer for patients who are self-pay. Some doctors offer discounts or will set up payment plans for their patients who do not have insurance, but you will need to ask so you aren't surprised when you get to your appointment.  2) Contact Your Local Health Department Not all health departments have doctors that can see patients for sick visits, but many do, so it is worth a call to see if yours does. If you don't know where your local health department is, you can check in your phone book. The CDC also has a tool to help you locate your state's health department, and many state websites also have listings of all of their local health departments.  3) Find a [facility] If your illness is not likely to be very severe or complicated, you may want to try a walk in clinic. These are popping up all over the country in pharmacies, drugstores, and shopping centers. They're usually staffed by nurse practitioners or physician assistants that have been trained to treat common illnesses and complaints. They're usually fairly quick and inexpensive. However, if you have serious medical issues or chronic medical problems, these are probably not your best option.  No Primary Care Doctor: Call Health Connect at  [phone] - they can help you locate a primary care doctor that  accepts your insurance, provides certain services, etc. Physician Referral Service- [phone]  Chronic Pain Problems: Organization         Address  Phone   Notes  [facility]  [phone] Patients need to be referred by their primary care doctor.    Medication Assistance: Organization         Address  Phone   Notes  [facility] Medication [facility] [address] [place], [place] [address] [phone] --Must be a resident of [facility] -- Must have NO insurance coverage whatsoever (no Medicaid/ Medicare, etc.) -- The pt. MUST have a primary care doctor that directs their care regularly and follows them in the community   [facility]  [phone]   [employer]  [phone]    Agencies that provide inexpensive medical care: Organization         Address  Phone   Notes  [place]  [phone]   [facility]    [phone]   [facility] [place], [place] [address] [phone]   [place] [address] [place] [phone]   [facility]    [phone])  [facility]    [phone]   [address], [place] Phone:  [phone], Fax:  [phone] Hours of Operation:  9 am - 6 pm, M-F.  Also accepts Medicaid/Medicare and self-pay.  [facility] for [place] [address], [address], [place] Phone: [phone], Fax: [phone]. Hours of Operation:  8:30 am - 5:30 pm, M-F.  Also accepts Medicaid and self-pay.  [facility] [address] [place] Phone: [phone]   [place], [place], [place] [phone], Ext. [phone] Mondays & Thursdays: 7-9 AM.  First 15 patients are seen on a first come, first serve basis.    [place] Providers:  Organization         Address  Phone   Notes  [facility] [address], [place] [phone] Also accepts self-pay patients.  [facility] [address], [place]  [phone]   [place], [address], [place] [phone]   [facility] [address], [place] [phone]   Lucianne Lei [address], [place]   [phone] Only accepts [place] [facility] [place] patients after they have their name applied to their card.   Self-Pay (no insurance) in [facility]:  Organization         Address  Phone   Notes  Sickle Cell Patients, [facility] [place] [phone]   [facility] [place] [phone]   [facility] [place]  [place], [place], [place] [phone]   [facility]/Dr. Osei-Bonsu  [address], [place] or [address], [place] [phone] Phone number for both [place] and [place] locations is the same.  Urgent Medical and [facility] [address] [place] [phone]   [facility] [address] [place] or [address] [phone]   [facility] [address], [place] [phone], phone; [phone], fax Sees patients 1st and 3rd Saturday of every month.  Must not qualify for public or private insurance (i.e. Medicaid, Medicare, [place] [employer] Choice, Veterans' Benefits)  Household income should be no more than 200% of the poverty level The clinic cannot treat you if you are pregnant or think you are pregnant  Sexually transmitted diseases are not treated at the clinic.

## 2019-01-10 NOTE — ED Notes (Signed)
Bed: ZO10 Expected date:  Expected time:  Means of arrival:  Comments: 78 y/o weakness

## 2019-01-10 NOTE — ED Triage Notes (Addendum)
Per EMS, patient coming from assisted living with complaints of generalized weakness starting yesterday. Patient normally ambulates without assistance but with a walker; patient had to lower herself to the ground due to weakness.  Denies N/V/D, dizziness, or LOC.   Patient states "I have bladder issues but I don't think this is related to that." Patient has a history of incontinence.

## 2019-01-10 NOTE — ED Notes (Signed)
Pt given ice water to drink

## 2019-01-10 NOTE — ED Provider Notes (Signed)
[facility] DEPT Provider Note   CSN: [id number] Arrival date & time: 01/10/19  2121     History   Chief Complaint Chief Complaint  Patient presents with  . Weakness    HPI [name] is a 78 y.o. female with a PMH of DVT/PE on Eliquis, HTN, and GERD presenting with diffuse weakness onset this afternoon. Son is a contributing historian. Patient was brought via EMS from assisted living due to not being able to stand after sliding down the side of the bed. Patient states she was sitting in bed and slowly slid down the bed due to the diffuse weakness. Patient reports she has chronic urinary incontinence, but it states symptoms have significantly worsened over the last 3 days. Patient states she is typically able to walk with a walker, but she had more difficulty ambulating today. Patient states she has also been drinking less fluids today due to concern for urinary incontinence. Patient reports dysuria and frequency. Patient denies fever, chills, abdominal pain, nausea, vomiting, diarrhea, cough, congestion, chest pain, or shortness of breath. Patient denies leg edema/pain, recent surgery, or recent travel. Patient denies any sick contacts.   HPI  Past Medical History:  Diagnosis Date  . 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 Benign familial tremor   . Clostridium difficile infection   . DDD (degenerative disc disease), lumbar   . Diverticulosis   . DVT (deep venous thrombosis) (HCC)    left leg  . Gallstones   . GERD (gastroesophageal reflux disease)   . Hypertension   . Osteoporosis   . Wears glasses     Patient Active Problem List   Diagnosis Date Noted  . Pulmonary embolus ([place]) 05/19/2018  . Pulmonary embolism ([place]) 05/18/2018  . HTN (hypertension) 05/18/2018  . Sleep apnea, obstructive 07/29/2014  . Essential tremor 04/24/2014    Past Surgical History:  Procedure Laterality Date  . ABDOMINAL HYSTERECTOMY  1983  . arterial  vascular fistula  2004   coiled at [place]  [phone]  . CARPAL TUNNEL RELEASE  right  . CHOLECYSTECTOMY N/A 03/29/2013   Procedure: LAPAROSCOPIC CHOLECYSTECTOMY WITH INTRAOPERATIVE CHOLANGIOGRAM;  Surgeon: Haywood Lasso, MD;  Location: [facility];  Service: General;  Laterality: N/A;  . EUS N/A 03/09/2013   Procedure: UPPER ENDOSCOPIC ULTRASOUND (EUS) LINEAR;  Surgeon: Beryle Beams, MD;  Location: [facility];  Service: Endoscopy;  Laterality: N/A;  . LUMBAR LAMINECTOMY/DECOMPRESSION MICRODISCECTOMY  11/30/2012   Procedure: LUMBAR LAMINECTOMY/DECOMPRESSION MICRODISCECTOMY 2 LEVELS;  Surgeon: Magnus Sinning, MD;  Location: [facility];  Service: Orthopedics;  Laterality: N/A;  DECOMPRESSION LAMINECTOMY L4-L5, L5-S1 CENTRAL   . ROTATOR CUFF REPAIR Bilateral 2004   two on right, one on left  . TRIGGER FINGER RELEASE Right     hand     OB History   No obstetric history on file.      Home Medications    Prior to Admission medications   Medication Sig Start Date End Date Taking? Authorizing Provider  apixaban (ELIQUIS) 5 MG TABS tablet Take 10 mg (Two 5 mg Tablets) po BID for 7 days and then Take 5 mg po BID onwards 05/20/18  Yes Sheikh, Omair Latif, DO  aspirin EC 81 MG tablet Take 81 mg by mouth at bedtime.   Yes [provider]  Cholecalciferol (VITAMIN D3) 2000 units capsule Take 1 capsule by mouth at bedtime.   Yes [provider]  Cyanocobalamin (  VITAMIN B-12 PO) Take 1,000 mcg by mouth daily. Dissolvable tablet   Yes [provider]  donepezil (ARICEPT) 5 MG tablet Take 5 mg by mouth daily. 01/02/19  Yes [provider]  escitalopram (LEXAPRO) 20 MG tablet Take 20 mg by mouth daily.  02/26/16  Yes [provider]  esomeprazole (NEXIUM) 40 MG capsule Take 40 mg by mouth at bedtime.  10/01/15  Yes [provider]  furosemide (LASIX) 20 MG tablet Take 20 mg by mouth as needed for edema.  06/07/18  Yes  [provider]  loratadine (CLARITIN) 10 MG tablet Take 10 mg by mouth at bedtime.   Yes [provider]  mirabegron ER (MYRBETRIQ) 50 MG TB24 tablet Take 50 mg by mouth daily.  02/13/16  Yes [provider]  olmesartan (BENICAR) 20 MG tablet Take 20 mg by mouth daily.   Yes [provider]  propranolol (INDERAL) 20 MG tablet TAKE ONE TABLET BY MOUTH TWICE A DAY 12/26/18  Yes Tat, Eustace Quail, DO  saccharomyces boulardii (FLORASTOR) 250 MG capsule Take 250 mg by mouth 2 (two) times daily.   Yes [provider]  TOVIAZ 8 MG TB24 tablet Take 8 mg by mouth daily.  05/19/18  Yes [provider]  trimethoprim (TRIMPEX) 100 MG tablet Take 100 mg by mouth daily.  03/19/15  Yes [provider]  busPIRone (BUSPAR) 10 MG tablet Take 10 mg by mouth daily. 05/17/18   [provider]  primidone (MYSOLINE) 50 MG tablet TAKE THREE TABLETS BY MOUTH AT BEDTIME Patient not taking: Reported on 01/10/2019 05/04/18   Tat, Eustace Quail, DO    Family History Family History  Problem Relation Age of Onset  . Other Mother        sarcoma  . Other Father        broken hip  . Dementia Father   . Pneumonia Father     Social History Social History   Tobacco Use  . Smoking status: Former Smoker    Packs/day: 0.50    Types: Cigarettes    Last attempt to quit: 07/11/1970    Years since quitting: 48.5  . Smokeless tobacco: Never Used  Substance Use Topics  . Alcohol use: Yes    Alcohol/week: 0.0 standard drinks    Comment: wine occasionally  . Drug use: No     Allergies   Buprenorphine hcl; Codeine; Morphine and related; Morphine; and Procaine   Review of Systems Review of Systems  Constitutional: Negative for activity change, appetite change, chills, diaphoresis, fatigue, fever and unexpected weight change.  HENT: Negative for congestion, rhinorrhea and sore throat.   Eyes: Negative for photophobia and visual disturbance.  Respiratory: Negative  for cough, chest tightness and shortness of breath.   Cardiovascular: Negative for chest pain, palpitations and leg swelling.  Gastrointestinal: Negative for abdominal pain, blood in stool, diarrhea, nausea and vomiting.  Endocrine: Negative for cold intolerance and heat intolerance.  Genitourinary: Positive for dysuria, frequency and urgency.  Musculoskeletal: Negative for back pain, myalgias and neck pain.  Skin: Negative for wound.  Allergic/Immunologic: Negative for immunocompromised state.  Neurological: Positive for tremors (Pt reports chronic essential tremor.) and weakness. Negative for dizziness, seizures, syncope, facial asymmetry, speech difficulty, light-headedness, numbness and headaches.  Psychiatric/Behavioral: Negative for confusion, dysphoric mood and sleep disturbance. The patient is not nervous/anxious.     Physical Exam Updated Vital Signs BP 134/90 (BP Location: Left Arm)   Pulse 76   Temp 98.5 F (36.9  C) (Oral)   Resp 16   Ht 4\' 11"  (1.499 m)   Wt 97.5 kg   SpO2 94%   BMI 43.42 kg/m   Physical Exam Vitals signs and nursing note reviewed.  Constitutional:      General: She is not in acute distress.    Appearance: She is well-developed. She is not diaphoretic.  HENT:     Head: Normocephalic and atraumatic.     Nose: Nose normal. No congestion or rhinorrhea.     Mouth/Throat:     Mouth: Mucous membranes are dry.     Pharynx: No oropharyngeal exudate or posterior oropharyngeal erythema.  Eyes:     Extraocular Movements: Extraocular movements intact.     Conjunctiva/sclera: Conjunctivae normal.     Pupils: Pupils are equal, round, and reactive to light.  Neck:     Musculoskeletal: Neck supple.  Cardiovascular:     Rate and Rhythm: Normal rate and regular rhythm.     Heart sounds: Normal heart sounds. No murmur. No friction rub. No gallop.   Pulmonary:     Effort: Pulmonary effort is normal. No respiratory distress.     Breath sounds: Normal breath  sounds. No wheezing or rales.  Abdominal:     Palpations: Abdomen is soft.     Tenderness: There is no abdominal tenderness.  Musculoskeletal: Normal range of motion.  Skin:    General: Skin is warm.     Findings: No erythema or rash.  Neurological:     Mental Status: She is alert and oriented to person, place, and time.    Mental Status:  Alert, oriented, thought content appropriate, able to give a coherent history. Speech fluent without evidence of aphasia. Able to follow 2 step commands without difficulty.  Cranial Nerves:  II:  Peripheral visual fields grossly normal, pupils equal, round, reactive to light III,IV, VI: ptosis not present, extra-ocular motions intact bilaterally  V,VII: smile symmetric, facial light touch sensation equal VIII: hearing grossly normal to voice  X: uvula elevates symmetrically  XI: bilateral shoulder shrug symmetric and strong XII: midline tongue extension without fassiculations Motor:  Normal tone. 4/5 in upper and lower extremities bilaterally including  equal grip strength and dorsiflexion/plantar flexion Sensory: light touch normal in all extremities.  Deep Tendon Reflexes: 2+ and symmetric in the biceps and patella Cerebellar: normal finger-to-nose with bilateral upper extremities Gait: patient unable to ambulate at this time due to diffuse weakness. CV: distal pulses palpable throughout    ED Treatments / Results  Labs (all labs ordered are listed, but only abnormal results are displayed) Labs Reviewed  CBC WITH DIFFERENTIAL/PLATELET - Abnormal; Notable for the following components:      Result Value   WBC 11.5 (*)    RBC 3.41 (*)    Hemoglobin 10.8 (*)    HCT 34.4 (*)    MCV 100.9 (*)    Monocytes Absolute 1.5 (*)    All other components within normal limits  URINE CULTURE  COMPREHENSIVE METABOLIC PANEL  URINALYSIS, ROUTINE W REFLEX MICROSCOPIC  CBG MONITORING, ED  I-STAT TROPONIN, ED   Hemoglobin  Date Value Ref Range Status    01/10/2019 10.8 (L) 12.0 - 15.0 g/dL Final  05/20/2018 13.2 12.0 - 15.0 g/dL Final  05/19/2018 12.5 12.0 - 15.0 g/dL Final  05/18/2018 13.3 12.0 - 15.0 g/dL Final    EKG EKG Interpretation  Date/Time:  Wednesday January 10 2019 21:42:56 EST Ventricular Rate:  75 PR Interval:    QRS Duration: 78  QT Interval:  412 QTC Calculation: 461 R Axis:   12 Text Interpretation:  Sinus rhythm Prolonged PR interval Low voltage, precordial leads No significant change since last tracing Confirmed by Lacretia Leigh ([id number]) on 01/10/2019 9:52:37 PM   Radiology No results found.  Procedures Procedures (including critical care time)  Medications Ordered in ED Medications  sodium chloride 0.9 % bolus 1,000 mL (0 mLs Intravenous Stopped 01/10/19 2349)     Initial Impression / Assessment and Plan / ED Course  I have reviewed the triage vital signs and the nursing notes.  Pertinent labs & imaging results that were available during my care of the patient were reviewed by me and considered in my medical decision making (see chart for details).  Clinical Course as of Jan 10 2358  Wed Jan 10, 2019  2343 WBCs elevated at 11.5 likely due to   [facility](!): 11.5 [AH]    Clinical Course User Index [AH] Arville Lime, PA-C   Suspect diffuse weakness is likely due to urinary tract infection based on history and physical exam. First troponin is negative and EKG does not reveal acute changes. WBCs are elevated at 11.5. Pt is afebrile, no CVA tenderness, normotensive, and denies N/V.  UA is pending and CMP pending.   At shift change care was transferred to [facility] who will follow pending studies, re-evaluate and determine disposition.    Final Clinical Impressions(s) / ED Diagnoses   Final diagnoses:  Generalized weakness    ED Discharge Orders    None       Arville Lime, PA-C 01/11/19 0000    Lacretia Leigh, MD 01/12/19 1336

## 2019-01-11 DIAGNOSIS — R531 Weakness: Secondary | ICD-10-CM | POA: Diagnosis not present

## 2019-01-11 LAB — URINALYSIS, ROUTINE W REFLEX MICROSCOPIC
BILIRUBIN URINE: NEGATIVE
Glucose, UA: NEGATIVE mg/dL
KETONES UR: NEGATIVE mg/dL
Nitrite: POSITIVE — AB
Protein, ur: NEGATIVE mg/dL
Specific Gravity, Urine: 1.011 (ref 1.005–1.030)
pH: 5 (ref 5.0–8.0)

## 2019-01-11 LAB — COMPREHENSIVE METABOLIC PANEL
ALK PHOS: 49 U/L (ref 38–126)
ALT: 12 U/L (ref 0–44)
AST: 14 U/L — AB (ref 15–41)
Albumin: 3.4 g/dL — ABNORMAL LOW (ref 3.5–5.0)
Anion gap: 7 (ref 5–15)
BILIRUBIN TOTAL: 0.4 mg/dL (ref 0.3–1.2)
BUN: 21 mg/dL (ref 8–23)
CO2: 21 mmol/L — ABNORMAL LOW (ref 22–32)
CREATININE: 0.65 mg/dL (ref 0.44–1.00)
Calcium: 8.1 mg/dL — ABNORMAL LOW (ref 8.9–10.3)
Chloride: 107 mmol/L (ref 98–111)
GFR calc Af Amer: >60 mL/min (ref >60)
GFR calc non Af Amer: >60 mL/min (ref >60)
GLUCOSE: 98 mg/dL (ref 70–99)
POTASSIUM: 3.9 mmol/L (ref 3.5–5.1)
Sodium: 135 mmol/L (ref 135–145)
TOTAL PROTEIN: 5.9 g/dL — AB (ref 6.5–8.1)

## 2019-01-11 MED ORDER — SODIUM CHLORIDE 0.9 % IV SOLN
2.0000 g | Freq: Once | INTRAVENOUS | Status: AC
  Administered 2019-01-11: 2 g via INTRAVENOUS
  Filled 2019-01-11: qty 20

## 2019-01-11 MED ORDER — CEPHALEXIN 500 MG PO CAPS: 500.0000 mg | ORAL_CAPSULE | Freq: Two times a day (BID) | ORAL | 0 refills | Status: DC

## 2019-01-11 NOTE — ED Provider Notes (Signed)
1:15 AM Patient care assumed from Alyson Reedy, PA-C at change of shift.  The patient presents to the ED for complaints of generalized weakness.  Her work-up is consistent with a urinary tract infection.  She does not meet criteria for SIRS or Sepsis. Was treated in the ED with 2g IV Rocephin. Plan to d/c on Keflex; urine culture pending.  The patient has been up and ambulatory to the bathroom without difficulty.  She uses a walker at baseline.  Family expresses comfort with discharge and outpatient primary care follow-up.  Return precautions discussed and provided. Patient discharged in stable condition with no unaddressed concerns.   Results for orders placed or performed during the hospital encounter of 01/10/19  Comprehensive metabolic panel  Result Value Ref Range   Sodium 135 135 - 145 mmol/L   Potassium 3.9 3.5 - 5.1 mmol/L   Chloride 107 98 - 111 mmol/L   CO2 21 (L) 22 - 32 mmol/L   Glucose, Bld 98 70 - 99 mg/dL   BUN 21 8 - 23 mg/dL   Creatinine, Ser 0.65 0.44 - 1.00 mg/dL   Calcium 8.1 (L) 8.9 - 10.3 mg/dL   Total Protein 5.9 (L) 6.5 - 8.1 g/dL   Albumin 3.4 (L) 3.5 - 5.0 g/dL   AST 14 (L) 15 - 41 U/L   ALT 12 0 - 44 U/L   Alkaline Phosphatase 49 38 - 126 U/L   Total Bilirubin 0.4 0.3 - 1.2 mg/dL   GFR calc non Af Amer >60 >60 mL/min   GFR calc Af Amer >60 >60 mL/min   Anion gap 7 5 - 15  CBC with Differential  Result Value Ref Range   WBC 11.5 (H) 4.0 - 10.5 K/uL   RBC 3.41 (L) 3.87 - 5.11 MIL/uL   Hemoglobin 10.8 (L) 12.0 - 15.0 g/dL   HCT 34.4 (L) 36.0 - 46.0 %   MCV 100.9 (H) 80.0 - 100.0 fL   MCH 31.7 26.0 - 34.0 pg   MCHC 31.4 30.0 - 36.0 g/dL   RDW 13.6 11.5 - 15.5 %   Platelets 199 150 - 400 K/uL   nRBC 0.0 0.0 - 0.2 %   Neutrophils Relative % 63 %   Neutro Abs 7.2 1.7 - 7.7 K/uL   Lymphocytes Relative 22 %   Lymphs Abs 2.6 0.7 - 4.0 K/uL   Monocytes Relative 13 %   Monocytes Absolute 1.5 (H) 0.1 - 1.0 K/uL   Eosinophils Relative 1 %   Eosinophils  Absolute 0.1 0.0 - 0.5 K/uL   Basophils Relative 1 %   Basophils Absolute 0.1 0.0 - 0.1 K/uL   Immature Granulocytes 0 %   Abs Immature Granulocytes 0.05 0.00 - 0.07 K/uL  Urinalysis, Routine w reflex microscopic  Result Value Ref Range   Color, Urine YELLOW YELLOW   APPearance CLEAR CLEAR   Specific Gravity, Urine 1.011 1.005 - 1.030   pH 5.0 5.0 - 8.0   Glucose, UA NEGATIVE NEGATIVE mg/dL   Hgb urine dipstick SMALL (A) NEGATIVE   Bilirubin Urine NEGATIVE NEGATIVE   Ketones, ur NEGATIVE NEGATIVE mg/dL   Protein, ur NEGATIVE NEGATIVE mg/dL   Nitrite POSITIVE (A) NEGATIVE   Leukocytes, UA SMALL (A) NEGATIVE   RBC / HPF 0-5 0 - 5 RBC/hpf   WBC, UA 6-10 0 - 5 WBC/hpf   Bacteria, UA MANY (A) NONE SEEN   Squamous Epithelial / LPF 0-5 0 - 5   Mucus PRESENT    Hyaline Casts,  UA PRESENT   CBG monitoring, ED  Result Value Ref Range   Glucose-Capillary 90 70 - 99 mg/dL  I-stat troponin, ED  Result Value Ref Range   Troponin i, poc 0.00 0.00 - 0.08 ng/mL   Comment 3             Antonietta Breach, PA-C 01/11/19 0326    Palumbo, April, MD 01/11/19 3912

## 2019-01-13 LAB — URINE CULTURE: Culture: >=100000 — AB

## 2019-01-14 ENCOUNTER — Telehealth: Payer: Self-pay | Admitting: Emergency Medicine

## 2019-01-14 NOTE — Telephone Encounter (Signed)
Post ED Visit - Positive Culture Follow-up  Culture report reviewed by antimicrobial stewardship pharmacist:  []  Elenor Quinones, Pharm.D. []  Heide Guile, Pharm.D., BCPS AQ-ID [x]  Parks Neptune, Pharm.D., BCPS []  Alycia Rossetti, Pharm.D., BCPS []  [place], Pharm.D., BCPS, AAHIVP []  Legrand Como, Pharm.D., BCPS, AAHIVP []  Salome Arnt, PharmD, BCPS []  Johnnette Gourd, PharmD, BCPS []  Hughes Better, PharmD, BCPS []  Leeroy Cha, PharmD  Positive urine culture Treated with cephalexin, organism sensitive to the same and no further patient follow-up is required at this time.  [place] Emmalina Espericueta 01/14/2019, 12:54 PM

## 2019-01-16 DIAGNOSIS — N3020 Other chronic cystitis without hematuria: Secondary | ICD-10-CM | POA: Diagnosis not present

## 2019-01-16 DIAGNOSIS — N3946 Mixed incontinence: Secondary | ICD-10-CM | POA: Diagnosis not present

## 2019-01-18 ENCOUNTER — Telehealth: Payer: Self-pay | Admitting: Internal Medicine

## 2019-01-18 DIAGNOSIS — R197 Diarrhea, unspecified: Secondary | ICD-10-CM

## 2019-01-18 NOTE — Telephone Encounter (Signed)
Pt advised that is having severe diarrhea... and would like to talk to someone... JG

## 2019-01-18 NOTE — Telephone Encounter (Signed)
Pt aware, order in epic. 

## 2019-01-18 NOTE — Telephone Encounter (Signed)
C diff PCR -- send stat

## 2019-01-18 NOTE — Telephone Encounter (Signed)
Pt was seen 1/29 in the ER, found to have a bladder infection. Pt was placed on Cephalexin. She started noticing changes in her stool Tuesday and yesterday the urologist changed her to Macrodantin. Yesterday she had some loose stools, this am she had an "explosion" and was incontinent of stool. Pt reports it was a thin pudding consistency. Pt is afraid she may have cdiff again. Please advise.

## 2019-01-23 DIAGNOSIS — M150 Primary generalized (osteo)arthritis: Secondary | ICD-10-CM | POA: Diagnosis not present

## 2019-01-23 DIAGNOSIS — R262 Difficulty in walking, not elsewhere classified: Secondary | ICD-10-CM | POA: Diagnosis not present

## 2019-01-23 DIAGNOSIS — R2689 Other abnormalities of gait and mobility: Secondary | ICD-10-CM | POA: Diagnosis not present

## 2019-01-24 ENCOUNTER — Ambulatory Visit
Admission: RE | Admit: 2019-01-24 | Discharge: 2019-01-24 | Disposition: A | Discharge status: Home / Self Care | Payer: Medicare Other | Source: Ambulatory Visit | Attending: Internal Medicine | Admitting: Internal Medicine

## 2019-01-24 DIAGNOSIS — R410 Disorientation, unspecified: Secondary | ICD-10-CM | POA: Diagnosis not present

## 2019-01-24 DIAGNOSIS — G3184 Mild cognitive impairment, so stated: Secondary | ICD-10-CM

## 2019-01-24 DIAGNOSIS — R413 Other amnesia: Secondary | ICD-10-CM | POA: Diagnosis not present

## 2019-01-24 MED ORDER — GADOBENATE DIMEGLUMINE 529 MG/ML IV SOLN
20.0000 mL | Freq: Once | INTRAVENOUS | Status: AC | PRN
  Administered 2019-01-24: 20 mL via INTRAVENOUS

## 2019-01-26 DIAGNOSIS — R262 Difficulty in walking, not elsewhere classified: Secondary | ICD-10-CM | POA: Diagnosis not present

## 2019-01-26 DIAGNOSIS — R2689 Other abnormalities of gait and mobility: Secondary | ICD-10-CM | POA: Diagnosis not present

## 2019-01-26 DIAGNOSIS — M150 Primary generalized (osteo)arthritis: Secondary | ICD-10-CM | POA: Diagnosis not present

## 2019-01-29 DIAGNOSIS — R262 Difficulty in walking, not elsewhere classified: Secondary | ICD-10-CM | POA: Diagnosis not present

## 2019-01-29 DIAGNOSIS — M150 Primary generalized (osteo)arthritis: Secondary | ICD-10-CM | POA: Diagnosis not present

## 2019-01-29 DIAGNOSIS — R2689 Other abnormalities of gait and mobility: Secondary | ICD-10-CM | POA: Diagnosis not present

## 2019-01-30 DIAGNOSIS — G894 Chronic pain syndrome: Secondary | ICD-10-CM | POA: Diagnosis not present

## 2019-01-30 DIAGNOSIS — M545 Low back pain: Secondary | ICD-10-CM | POA: Diagnosis not present

## 2019-01-30 DIAGNOSIS — M961 Postlaminectomy syndrome, not elsewhere classified: Secondary | ICD-10-CM | POA: Diagnosis not present

## 2019-01-31 DIAGNOSIS — R262 Difficulty in walking, not elsewhere classified: Secondary | ICD-10-CM | POA: Diagnosis not present

## 2019-01-31 DIAGNOSIS — M150 Primary generalized (osteo)arthritis: Secondary | ICD-10-CM | POA: Diagnosis not present

## 2019-01-31 DIAGNOSIS — R2689 Other abnormalities of gait and mobility: Secondary | ICD-10-CM | POA: Diagnosis not present

## 2019-02-02 DIAGNOSIS — R2689 Other abnormalities of gait and mobility: Secondary | ICD-10-CM | POA: Diagnosis not present

## 2019-02-02 DIAGNOSIS — M150 Primary generalized (osteo)arthritis: Secondary | ICD-10-CM | POA: Diagnosis not present

## 2019-02-02 DIAGNOSIS — R262 Difficulty in walking, not elsewhere classified: Secondary | ICD-10-CM | POA: Diagnosis not present

## 2019-02-06 ENCOUNTER — Telehealth: Payer: Self-pay | Admitting: Neurology

## 2019-02-06 DIAGNOSIS — R262 Difficulty in walking, not elsewhere classified: Secondary | ICD-10-CM | POA: Diagnosis not present

## 2019-02-06 DIAGNOSIS — M150 Primary generalized (osteo)arthritis: Secondary | ICD-10-CM | POA: Diagnosis not present

## 2019-02-06 DIAGNOSIS — R2689 Other abnormalities of gait and mobility: Secondary | ICD-10-CM | POA: Diagnosis not present

## 2019-02-06 NOTE — Telephone Encounter (Signed)
Received message from patient [phone]) stating, "has CPAP machine, she is changing the supplier of her CPAP supplies to [employer] [phone]. Please call her to advise the message has been received."  I don't see where we have ever managed patient's CPAP or ordered supplies for her. Tried to call back with no answer. No vm.

## 2019-02-08 DIAGNOSIS — R2689 Other abnormalities of gait and mobility: Secondary | ICD-10-CM | POA: Diagnosis not present

## 2019-02-08 DIAGNOSIS — M150 Primary generalized (osteo)arthritis: Secondary | ICD-10-CM | POA: Diagnosis not present

## 2019-02-08 DIAGNOSIS — R262 Difficulty in walking, not elsewhere classified: Secondary | ICD-10-CM | POA: Diagnosis not present

## 2019-02-12 DIAGNOSIS — M150 Primary generalized (osteo)arthritis: Secondary | ICD-10-CM | POA: Diagnosis not present

## 2019-02-12 DIAGNOSIS — R262 Difficulty in walking, not elsewhere classified: Secondary | ICD-10-CM | POA: Diagnosis not present

## 2019-02-12 DIAGNOSIS — R2689 Other abnormalities of gait and mobility: Secondary | ICD-10-CM | POA: Diagnosis not present

## 2019-02-20 DIAGNOSIS — G894 Chronic pain syndrome: Secondary | ICD-10-CM | POA: Diagnosis not present

## 2019-02-22 DIAGNOSIS — Z86711 Personal history of pulmonary embolism: Secondary | ICD-10-CM | POA: Diagnosis not present

## 2019-02-22 DIAGNOSIS — R0602 Shortness of breath: Secondary | ICD-10-CM | POA: Diagnosis not present

## 2019-02-22 DIAGNOSIS — F419 Anxiety disorder, unspecified: Secondary | ICD-10-CM | POA: Diagnosis not present

## 2019-02-22 DIAGNOSIS — I10 Essential (primary) hypertension: Secondary | ICD-10-CM | POA: Diagnosis not present

## 2019-03-08 DIAGNOSIS — I10 Essential (primary) hypertension: Secondary | ICD-10-CM | POA: Diagnosis not present

## 2019-03-08 DIAGNOSIS — R0602 Shortness of breath: Secondary | ICD-10-CM | POA: Diagnosis not present

## 2019-03-15 DIAGNOSIS — R197 Diarrhea, unspecified: Secondary | ICD-10-CM | POA: Diagnosis not present

## 2019-03-15 DIAGNOSIS — K5909 Other constipation: Secondary | ICD-10-CM | POA: Diagnosis not present

## 2019-03-15 DIAGNOSIS — I10 Essential (primary) hypertension: Secondary | ICD-10-CM | POA: Diagnosis not present

## 2019-03-22 DIAGNOSIS — Z0289 Encounter for other administrative examinations: Secondary | ICD-10-CM | POA: Diagnosis not present

## 2019-03-22 DIAGNOSIS — R410 Disorientation, unspecified: Secondary | ICD-10-CM | POA: Diagnosis not present

## 2019-03-22 DIAGNOSIS — I10 Essential (primary) hypertension: Secondary | ICD-10-CM | POA: Diagnosis not present

## 2019-03-22 DIAGNOSIS — R197 Diarrhea, unspecified: Secondary | ICD-10-CM | POA: Diagnosis not present

## 2019-03-26 ENCOUNTER — Telehealth: Payer: Self-pay | Admitting: Neurology

## 2019-03-26 NOTE — Telephone Encounter (Signed)
Okay to fill x 30 days but can offer evisit now since that May visit will likely be an evisit anyway

## 2019-03-26 NOTE — Telephone Encounter (Signed)
Pt last seen in July 2019.  Next appt scheduled for Apr 18, 2019.  Dr. Carles Collet - OK to fill until her May appt?

## 2019-03-27 NOTE — Telephone Encounter (Signed)
LMOM for Evisit  °

## 2019-04-04 ENCOUNTER — Other Ambulatory Visit: Payer: Self-pay

## 2019-04-04 ENCOUNTER — Telehealth (INDEPENDENT_AMBULATORY_CARE_PROVIDER_SITE_OTHER): Payer: Medicare Other | Admitting: Neurology

## 2019-04-04 ENCOUNTER — Encounter: Payer: Self-pay | Admitting: Neurology

## 2019-04-04 DIAGNOSIS — G250 Essential tremor: Secondary | ICD-10-CM

## 2019-04-04 DIAGNOSIS — I2782 Chronic pulmonary embolism: Secondary | ICD-10-CM | POA: Diagnosis not present

## 2019-04-04 NOTE — Progress Notes (Signed)
Virtual Visit via Video Note The purpose of this virtual visit is to provide medical care while limiting exposure to the novel coronavirus.    Consent was obtained for video visit:  Yes.   Answered questions that patient had about telehealth interaction:  Yes.   I discussed the limitations, risks, security and privacy concerns of performing an evaluation and management service by telemedicine. I also discussed with the patient that there may be a patient responsible charge related to this service. The patient expressed understanding and agreed to proceed.  Pt location: Home Physician Location: office Name of referring provider:  [name], MD I connected with [name] at patients initiation/request on 04/04/2019 at 10:15 AM EDT by video enabled telemedicine application and verified that I am speaking with the correct person using two identifiers. Pt MRN:  [id number] Pt DOB:  Mar 26, 1941 Video Participants:  [name];    History of Present Illness:  Patient is seen today in follow-up.  I have not seen her since last summer.  At that visit, I had to take the patient off of her primidone because of interaction with the Eliquis.  She reports that she has been much more tremulous since then.  She has had to go to drinking through a straw because of spilling liquids.  She is having trouble brushing her teeth.  She asks if there is anything else we can give her for tremor.  She is still on propranolol, 20 mg twice per day.  Her blood pressure has been a little bit up, but she does not know the pulse.  Meds are administered by the staff at [facility].  She notes that she has been a little bit more unstable.  She has been dealing with an intractable urinary tract infection.  She has been had diarrhea.  She recently got another antibiotic and states that things are slowly resolving.  Current Outpatient Medications on File Prior to Visit  Medication Sig Dispense Refill  . apixaban  (ELIQUIS) 5 MG TABS tablet Take 10 mg (Two 5 mg Tablets) po BID for 7 days and then Take 5 mg po BID onwards 70 tablet 0  . aspirin EC 81 MG tablet Take 81 mg by mouth at bedtime.    . busPIRone (BUSPAR) 10 MG tablet Take 10 mg by mouth daily.    . cephALEXin (KEFLEX) 500 MG capsule Take 1 capsule (500 mg total) by mouth 2 (two) times daily. 14 capsule 0  . Cholecalciferol (VITAMIN D3) 2000 units capsule Take 1 capsule by mouth at bedtime.    . Cyanocobalamin (VITAMIN B-12 PO) Take 1,000 mcg by mouth daily. Dissolvable tablet    . donepezil (ARICEPT) 5 MG tablet Take 5 mg by mouth daily.    Marland Kitchen escitalopram (LEXAPRO) 20 MG tablet Take 20 mg by mouth daily.     Marland Kitchen esomeprazole (NEXIUM) 40 MG capsule Take 40 mg by mouth at bedtime.     . furosemide (LASIX) 20 MG tablet Take 20 mg by mouth as needed for edema.     Marland Kitchen loratadine (CLARITIN) 10 MG tablet Take 10 mg by mouth at bedtime.    . mirabegron ER (MYRBETRIQ) 50 MG TB24 tablet Take 50 mg by mouth daily.     Marland Kitchen olmesartan (BENICAR) 20 MG tablet Take 20 mg by mouth daily.    . primidone (MYSOLINE) 50 MG tablet TAKE THREE TABLETS BY MOUTH AT BEDTIME (Patient not taking: Reported on 01/10/2019) 90 tablet 5  .  propranolol (INDERAL) 20 MG tablet TAKE ONE TABLET BY MOUTH TWICE A DAY 60 tablet 0  . saccharomyces boulardii (FLORASTOR) 250 MG capsule Take 250 mg by mouth 2 (two) times daily.    . TOVIAZ 8 MG TB24 tablet Take 8 mg by mouth daily.     Marland Kitchen trimethoprim (TRIMPEX) 100 MG tablet Take 100 mg by mouth daily.      No current facility-administered medications on file prior to visit.       Observations/Objective:   Vitals:   04/04/19 1008  BP: (!) 146/88  Weight: 210 lb (95.3 kg)  Height: 4\' 11"  (1.499 m)   GEN:  The patient appears stated age and is in NAD.  Neurological examination:  Orientation: The patient is alert and oriented x3. Cranial nerves: There is good facial symmetry. There is no facial hypomimia.  The speech is fluent and  clear. Soft palate rises symmetrically and there is no tongue deviation. Hearing is intact to conversational tone. Motor: Strength is at least antigravity x 4.   Shoulder shrug is equal and symmetric.  There is no pronator drift.  Movement examination: Tone: unable Abnormal movements: There is mild tremor of the outstretched hands.  The right is worse than the left. Coordination:  There is no decremation with RAM's, in the upper extremities bilaterally. Gait and Station: The patient pushes off of her deep-seated chair to arise.  She is initially very unstable until she gets her walker.  She does fairly well with that, but still has some instability.      Assessment and Plan:   1.  Essential tremor.             -long discussion with the patient today.               -continue Propranolol, 20 mg bid  -Patient wants to be back on primidone, but is on Eliquis.  Asked me about alternatives.  I told her that she could try talking to her primary care physician (prescriber of Eliquis) and see if that perhaps could be changed to Pradaxa.  If so, then we could get her back on primidone.  That would be certainly up to her primary care physician.  -Talked to her about the option of weighted gloves/wrist weight to help her with test such as brushing her teeth.             -has evidence of peripheral neuropathy.  Cancelled her EMG in the past.  -Discussed in detail Covid-19 and risk factors for this, including age and medical problems.  Discussed importance of social distancing.  Discussed importance of staying home at all times, as is feasible.  Discussed taking advantage of grocery store hours for the elderly.  Pt expressed understanding. 2..  Hx AVM repair with coiling             -She does have an upgoing toe on the right, which I think is likely just a result of her history of AVM, which she reports was on the left.  I see nothing else focal or lateralizing on her examination today, so decided to hold off  on any further neuroimaging.               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 3.  Trigeminal neuralgia             -  no longer having sx 4.  OSAS             -On CPAP faithfully  5  Anxiety and depression             -She had neuropsych testing on 09/22/2016.   As suspected, there was no evidence of dementia.  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   6.  Recurrent DVT/PE             -seeing Dr. Alvy Bimler             -on eliquis.  As above, if she is able to changed to Pradaxa, primidone could be an option again.  Follow Up Instructions:    -I discussed the assessment and treatment plan with the patient. The patient was provided an opportunity to ask questions and all were answered. The patient agreed with the plan and demonstrated an understanding of the instructions.   The patient was advised to call back or seek an in-person evaluation if the symptoms worsen or if the condition fails to improve as anticipated.    Total Time spent in visit with the patient was:  25 min, of which more than 50% of the time was spent in counseling and/or coordinating care on safety.   Pt understands and agrees with the plan of care outlined.  F/u 9 months   Wells Guiles Tat, DO

## 2019-04-05 ENCOUNTER — Other Ambulatory Visit: Payer: Self-pay | Admitting: Neurology

## 2019-04-05 DIAGNOSIS — R262 Difficulty in walking, not elsewhere classified: Secondary | ICD-10-CM | POA: Diagnosis not present

## 2019-04-05 DIAGNOSIS — M150 Primary generalized (osteo)arthritis: Secondary | ICD-10-CM | POA: Diagnosis not present

## 2019-04-10 DIAGNOSIS — M150 Primary generalized (osteo)arthritis: Secondary | ICD-10-CM | POA: Diagnosis not present

## 2019-04-10 DIAGNOSIS — R262 Difficulty in walking, not elsewhere classified: Secondary | ICD-10-CM | POA: Diagnosis not present

## 2019-04-11 DIAGNOSIS — R262 Difficulty in walking, not elsewhere classified: Secondary | ICD-10-CM | POA: Diagnosis not present

## 2019-04-11 DIAGNOSIS — M150 Primary generalized (osteo)arthritis: Secondary | ICD-10-CM | POA: Diagnosis not present

## 2019-04-13 DIAGNOSIS — M150 Primary generalized (osteo)arthritis: Secondary | ICD-10-CM | POA: Diagnosis not present

## 2019-04-13 DIAGNOSIS — R262 Difficulty in walking, not elsewhere classified: Secondary | ICD-10-CM | POA: Diagnosis not present

## 2019-04-17 DIAGNOSIS — M150 Primary generalized (osteo)arthritis: Secondary | ICD-10-CM | POA: Diagnosis not present

## 2019-04-17 DIAGNOSIS — R262 Difficulty in walking, not elsewhere classified: Secondary | ICD-10-CM | POA: Diagnosis not present

## 2019-04-18 ENCOUNTER — Ambulatory Visit: Payer: Medicare Other | Admitting: Neurology

## 2019-04-20 DIAGNOSIS — R262 Difficulty in walking, not elsewhere classified: Secondary | ICD-10-CM | POA: Diagnosis not present

## 2019-04-20 DIAGNOSIS — M150 Primary generalized (osteo)arthritis: Secondary | ICD-10-CM | POA: Diagnosis not present

## 2019-04-24 DIAGNOSIS — M150 Primary generalized (osteo)arthritis: Secondary | ICD-10-CM | POA: Diagnosis not present

## 2019-04-24 DIAGNOSIS — R262 Difficulty in walking, not elsewhere classified: Secondary | ICD-10-CM | POA: Diagnosis not present

## 2019-04-26 DIAGNOSIS — R262 Difficulty in walking, not elsewhere classified: Secondary | ICD-10-CM | POA: Diagnosis not present

## 2019-04-26 DIAGNOSIS — M150 Primary generalized (osteo)arthritis: Secondary | ICD-10-CM | POA: Diagnosis not present

## 2019-04-30 DIAGNOSIS — M150 Primary generalized (osteo)arthritis: Secondary | ICD-10-CM | POA: Diagnosis not present

## 2019-04-30 DIAGNOSIS — R262 Difficulty in walking, not elsewhere classified: Secondary | ICD-10-CM | POA: Diagnosis not present

## 2019-05-02 DIAGNOSIS — R262 Difficulty in walking, not elsewhere classified: Secondary | ICD-10-CM | POA: Diagnosis not present

## 2019-05-02 DIAGNOSIS — M150 Primary generalized (osteo)arthritis: Secondary | ICD-10-CM | POA: Diagnosis not present

## 2019-05-04 DIAGNOSIS — R262 Difficulty in walking, not elsewhere classified: Secondary | ICD-10-CM | POA: Diagnosis not present

## 2019-05-04 DIAGNOSIS — M150 Primary generalized (osteo)arthritis: Secondary | ICD-10-CM | POA: Diagnosis not present

## 2019-05-22 DIAGNOSIS — L820 Inflamed seborrheic keratosis: Secondary | ICD-10-CM | POA: Diagnosis not present

## 2019-05-22 DIAGNOSIS — D2261 Melanocytic nevi of right upper limb, including shoulder: Secondary | ICD-10-CM | POA: Diagnosis not present

## 2019-05-22 DIAGNOSIS — Z85828 Personal history of other malignant neoplasm of skin: Secondary | ICD-10-CM | POA: Diagnosis not present

## 2019-05-22 DIAGNOSIS — L298 Other pruritus: Secondary | ICD-10-CM | POA: Diagnosis not present

## 2019-05-22 DIAGNOSIS — D1801 Hemangioma of skin and subcutaneous tissue: Secondary | ICD-10-CM | POA: Diagnosis not present

## 2019-05-22 DIAGNOSIS — L821 Other seborrheic keratosis: Secondary | ICD-10-CM | POA: Diagnosis not present

## 2019-05-22 DIAGNOSIS — L720 Epidermal cyst: Secondary | ICD-10-CM | POA: Diagnosis not present

## 2019-05-22 DIAGNOSIS — L814 Other melanin hyperpigmentation: Secondary | ICD-10-CM | POA: Diagnosis not present

## 2019-05-25 ENCOUNTER — Ambulatory Visit (INDEPENDENT_AMBULATORY_CARE_PROVIDER_SITE_OTHER): Payer: Medicare Other | Admitting: Podiatry

## 2019-05-25 ENCOUNTER — Other Ambulatory Visit: Payer: Self-pay

## 2019-05-25 VITALS — Temp 97.6°F

## 2019-05-25 DIAGNOSIS — D689 Coagulation defect, unspecified: Secondary | ICD-10-CM | POA: Diagnosis not present

## 2019-05-25 DIAGNOSIS — C4490 Unspecified malignant neoplasm of skin, unspecified: Secondary | ICD-10-CM | POA: Insufficient documentation

## 2019-05-25 DIAGNOSIS — F32A Depression, unspecified: Secondary | ICD-10-CM | POA: Insufficient documentation

## 2019-05-25 DIAGNOSIS — B351 Tinea unguium: Secondary | ICD-10-CM

## 2019-05-25 DIAGNOSIS — F329 Major depressive disorder, single episode, unspecified: Secondary | ICD-10-CM | POA: Insufficient documentation

## 2019-05-27 NOTE — Progress Notes (Signed)
Subjective:  Patient ID: [name], female    DOB: 09-Jul-1941,  MRN: [id number]  Chief Complaint  Patient presents with  . Nail Problem    Nail trim 1-5 bilateral  . Nail Problem    Pt interested in medications    78 y.o. female presents with the above complaint.  Reports painfully elongated nails to both feet. On chronic AC.  Review of Systems: Negative except as noted in the HPI. Denies N/V/F/Ch.  Past Medical History:  Diagnosis Date  . 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 Benign familial tremor   . Clostridium difficile infection   . DDD (degenerative disc disease), lumbar   . Diverticulosis   . DVT (deep venous thrombosis) (HCC)    left leg  . Gallstones   . GERD (gastroesophageal reflux disease)   . Hypertension   . Osteoporosis   . Wears glasses     Current Outpatient Medications:  .  apixaban (ELIQUIS) 5 MG TABS tablet, Take 10 mg (Two 5 mg Tablets) po BID for 7 days and then Take 5 mg po BID onwards, Disp: 70 tablet, Rfl: 0 .  aspirin EC 81 MG tablet, Take 81 mg by mouth at bedtime., Disp: , Rfl:  .  busPIRone (BUSPAR) 10 MG tablet, Take 10 mg by mouth daily., Disp: , Rfl:  .  carvedilol (COREG) 6.25 MG tablet, , Disp: , Rfl:  .  cephALEXin (KEFLEX) 500 MG capsule, Take 1 capsule (500 mg total) by mouth 2 (two) times daily., Disp: 14 capsule, Rfl: 0 .  Cholecalciferol (VITAMIN D3) 2000 units capsule, Take 1 capsule by mouth at bedtime., Disp: , Rfl:  .  Cyanocobalamin (VITAMIN B-12 PO), Take 1,000 mcg by mouth daily. Dissolvable tablet, Disp: , Rfl:  .  donepezil (ARICEPT) 5 MG tablet, Take 5 mg by mouth daily., Disp: , Rfl:  .  escitalopram (LEXAPRO) 20 MG tablet, Take 20 mg by mouth daily. , Disp: , Rfl:  .  esomeprazole (NEXIUM) 40 MG capsule, Take 40 mg by mouth at bedtime. , Disp: , Rfl:  .  furosemide (LASIX) 20 MG tablet, Take 20 mg by mouth as needed for edema. , Disp: , Rfl:  .  levofloxacin (LEVAQUIN) 500 MG tablet, levofloxacin 500 mg  tablet, Disp: , Rfl:  .  loratadine (CLARITIN) 10 MG tablet, Take 10 mg by mouth at bedtime., Disp: , Rfl:  .  mirabegron ER (MYRBETRIQ) 50 MG TB24 tablet, Take 50 mg by mouth daily. , Disp: , Rfl:  .  nitrofurantoin (MACRODANTIN) 100 MG capsule, , Disp: , Rfl:  .  olmesartan (BENICAR) 20 MG tablet, Take 20 mg by mouth daily., Disp: , Rfl:  .  primidone (MYSOLINE) 50 MG tablet, TAKE THREE TABLETS BY MOUTH AT BEDTIME, Disp: 90 tablet, Rfl: 5 .  propranolol (INDERAL) 20 MG tablet, TAKE ONE TABLET BY MOUTH TWICE A DAY, Disp: 60 tablet, Rfl: 5 .  saccharomyces boulardii (FLORASTOR) 250 MG capsule, Take 250 mg by mouth 2 (two) times daily., Disp: , Rfl:  .  TOVIAZ 8 MG TB24 tablet, Take 8 mg by mouth daily. , Disp: , Rfl:  .  traMADol (ULTRAM) 50 MG tablet, Ultram 50 mg tablet  Take 1 tablet 3 times a day by oral route as needed for pain for 5 days., Disp: , Rfl:  .  trimethoprim (TRIMPEX) 100 MG tablet, Take 100 mg by mouth daily. , Disp: , Rfl:   Social History   Tobacco Use  Smoking Status Former Smoker  . Packs/day: 0.50  . Types: Cigarettes  . Quit date: 07/11/1970  . Years since quitting: 48.9  Smokeless Tobacco Never Used    Allergies  Allergen Reactions  . Buprenorphine Hcl Nausea And Vomiting  . Codeine Nausea And Vomiting    Other reaction(s): Other (See Comments) But tolerates Tramadol But tolerates Tramadol Vomiting   . Morphine And Related Nausea And Vomiting  . Morphine     Other reaction(s): Other (See Comments) Vomiting  . Procaine     Other reaction(s): Other (See Comments) Other reaction(s): Shakes Novacaine-Shakes   Objective:   Vitals:   05/25/19 1318  Temp: 97.6 F (36.4 C)   There is no height or weight on file to calculate BMI. Constitutional Well developed. Well nourished.  Vascular Dorsalis pedis pulses palpable bilaterally. Posterior tibial pulses palpable bilaterally. Capillary refill normal to all digits.  No cyanosis or clubbing noted.  Pedal hair growth normal.  Neurologic Normal speech. Oriented to person, place, and time. Epicritic sensation to light touch grossly present bilaterally.  Dermatologic Nails elongated dystrophic pain to palpation No open wounds. No skin lesions.  Orthopedic: Normal joint ROM without pain or crepitus bilaterally. No visible deformities. No bony tenderness.   Radiographs: None Assessment:   1. 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  Onychomycosis with coagulation defect -Nails palliatively debridement as below -Educated on self-care  Procedure: Nail Debridement Rationale: Pain Type of Debridement: manual, sharp debridement. Instrumentation: Nail nipper, rotary burr. Number of Nails: 10  Return in about 9 weeks (around 07/27/2019) for Routine Foot Care - On AC.

## 2019-05-29 ENCOUNTER — Inpatient Hospital Stay: Payer: Medicare Other | Attending: Hematology and Oncology | Admitting: Hematology and Oncology

## 2019-05-29 ENCOUNTER — Other Ambulatory Visit: Payer: Self-pay

## 2019-05-29 ENCOUNTER — Encounter: Payer: Self-pay | Admitting: Hematology and Oncology

## 2019-05-29 DIAGNOSIS — I2782 Chronic pulmonary embolism: Secondary | ICD-10-CM

## 2019-05-29 DIAGNOSIS — Z7901 Long term (current) use of anticoagulants: Secondary | ICD-10-CM | POA: Insufficient documentation

## 2019-05-29 DIAGNOSIS — Z79899 Other long term (current) drug therapy: Secondary | ICD-10-CM | POA: Diagnosis not present

## 2019-05-29 DIAGNOSIS — I2699 Other pulmonary embolism without acute cor pulmonale: Secondary | ICD-10-CM

## 2019-05-29 DIAGNOSIS — G250 Essential tremor: Secondary | ICD-10-CM | POA: Insufficient documentation

## 2019-05-29 NOTE — Progress Notes (Signed)
[facility] OFF[facility] PROGRESS NOTE  [name], MD  ASSESSMENT & PLAN:  Pulmonary embolism [facility]) This last episode of blood clot appeared to be provoked.  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 Her current anticoagulation therapy will interfere with the tests and it is not possible to interpret the test results.  Taking her off the anticoagulation therapy to do the tests may precipitate another thrombotic event. I do not see a reason to order excessive testing to screen for thrombophilia disorder as it would not change our management.  The goal of anticoagulation therapy is for life.   I also reviewed the rationale of not pursuing thrombophilia screening test to look for inheritable blood clotting disorder because it will not benefit her children.  Even if her children tested positive, it is not a reason to put them on anticoagulation therapy.  For blood test that is not going to change management, it is not going to be helpful  For now, I recommend the patient to continue her treatment with Eliquis indefinitely  We discussed the importance of monitoring for signs and symptoms of bleeding She will continue close follow-up with primary care doctor for blood count monitoring and kidney function check while on Eliquis She does not need return visit.  I will see her back if perioperative anticoagulation management is needed for elective surgery Another reason to discontinue anticoagulation treatment would be recurrent falls or excessive bleeding  Essential tremor She has essential tremors on beta-blocker She also have unsteady gait and is being followed by neurologist   No orders of the defined  types were placed in this encounter.   INTERVAL HISTORY: [name] 78 y.o. female returns for discussion about chronic anticoagulation therapy Since last time I saw her, she had one episode of fall but not significant She denies injuries The patient denies any recent signs or symptoms of bleeding such as spontaneous epistaxis, hematuria or hematochezia. 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 She could not recall the circumstances that might have led to development of DVT at that time.  She recalled that her legs were swollen, left greater than right She underwent ultrasound venous Doppler and was diagnosed with DVT She completed 6 months of anticoagulation therapy with Eliquis  Since 2017, she had been ill She had numerous falls and with poor mobility. She uses a walker on a regular basis She also had recurrent bladder infection leading to subsequent complication with diagnosis of C. Difficile.  Prior to this recent admission, she has not been feeling well with another recurrent bladder infection and was treated with antibiotics treatment.   It is possible that she may have been dehydrated although she claims she is drinking plenty of fluids on a regular basis. She presented to  see her primary care doctor for an annual visit and complain of shortness of breath for over 2 to 3 weeks with minimum exertion. She underwent CT imaging PE protocol on 05/18/18 which showed;  1. Acute large clot burden bilateral pulmonary  embolism involving the bifurcation of the left pulmonary artery and the lobar, segmental and subsegmental pulmonary artery branches of all lung lobes. 2. Positive for acute PE with CT evidence of right heart strain (RV/LV Ratio = 1.4) consistent with at least submassive (intermediate risk) PE. The presence of right heart strain has been associated with an increased risk of morbidity and mortality. 3. Dilated main pulmonary artery, stable since 12/02/2017, compatible with chronic pulmonary arterial hypertension. 4. No acute pulmonary disease. Solitary 4 mm left upper lobe pulmonary nodule is stable since 12/02/2017 chest CT, probably benign. No follow-up needed if patient is low-risk  She denies lower extremity swelling, warmth, tenderness & erythema.  She denies recent chest pain on exertion, pre-syncopal episodes, hemoptysis, or palpitation. She denies recent history of trauma, long distance travel, dehydration, recent surgery or smoking. She had no prior history or diagnosis of cancer. Her age appropriate screening programs are up-to-date. She had prior surgeries before and never had perioperative thromboembolic events. The patient had been exposed to birth control pills for 20-25 years, hormone replacement therapy for 10-15 years and never had thrombotic events. The patient had been pregnant before x 2 and denies history of peripartum thromboembolic event or history of recurrent miscarriages. There is no family history of blood clots or miscarriages.  She was subsequently hospitalized from 05/18/2018 to 05/20/2018.  She received IV heparin, subsequently transitioned to oral anticoagulation therapy. Baseline echocardiogram showed no evidence of cardiomyopathy from left ventricular strain Ultrasound venous Doppler on 05/19/2018 showed: Right: Ultrasound is unable to distinguish whether obstruction in the Peroneal veins is acute or chronic. Left: There is no evidence of deep vein thrombosis in the lower  extremity. However, portions of this examination were limited- see technologist comments above.  I have reviewed the past medical history, past surgical history, social history and family history with the patient and they are unchanged from previous note.  ALLERGIES:  is allergic to buprenorphine hcl; codeine; morphine and related; morphine; and procaine.  MEDICATIONS:  Current Outpatient Medications  Medication Sig Dispense Refill  . apixaban (ELIQUIS) 5 MG TABS tablet Take 10 mg (Two 5 mg Tablets) po BID for 7 days and then Take 5 mg po BID onwards 70 tablet 0  . aspirin EC 81 MG tablet Take 81 mg by mouth at bedtime.    . busPIRone (BUSPAR) 10 MG tablet Take 10 mg by mouth daily.    . carvedilol (COREG) 6.25 MG tablet     . Cholecalciferol (VITAMIN D3) 2000 units capsule Take 1 capsule by mouth at bedtime.    . Cyanocobalamin (VITAMIN B-12 PO) Take 1,000 mcg by mouth daily. Dissolvable tablet    . donepezil (ARICEPT) 5 MG tablet Take 5 mg by mouth daily.    Marland Kitchen escitalopram (LEXAPRO) 20 MG tablet Take 20 mg by mouth daily.     Marland Kitchen esomeprazole (NEXIUM) 40 MG capsule Take 40 mg by mouth at bedtime.     . furosemide (LASIX) 20 MG tablet Take 20 mg by mouth as needed for edema.     Marland Kitchen levofloxacin (LEVAQUIN) 500 MG tablet levofloxacin 500 mg tablet    . loratadine (CLARITIN) 10 MG tablet Take 10 mg by mouth at bedtime.    . mirabegron ER (  MYRBETRIQ) 50 MG TB24 tablet Take 50 mg by mouth daily.     . nitrofurantoin (MACRODANTIN) 100 MG capsule     . olmesartan (BENICAR) 20 MG tablet Take 20 mg by mouth daily.    . propranolol (INDERAL) 20 MG tablet TAKE ONE TABLET BY MOUTH TWICE A DAY 60 tablet 5  . saccharomyces boulardii (FLORASTOR) 250 MG capsule Take 250 mg by mouth 2 (two) times daily.    . TOVIAZ 8 MG TB24 tablet Take 8 mg by mouth daily.     Marland Kitchen trimethoprim (TRIMPEX) 100 MG tablet Take 100 mg by mouth daily.      No current facility-administered medications for this visit.      REVIEW  OF SYSTEMS:   Constitutional: Denies fevers, chills or night sweats Eyes: Denies blurriness of vision Ears, nose, mouth, throat, and face: Denies mucositis or sore throat Respiratory: Denies cough, dyspnea or wheezes Cardiovascular: Denies palpitation, chest discomfort or lower extremity swelling Gastrointestinal:  Denies nausea, heartburn or change in bowel habits Skin: Denies abnormal skin rashes Lymphatics: Denies new lymphadenopathy or easy bruising Neurological:Denies numbness, tingling or new weaknesses Behavioral/Psych: Mood is stable, no new changes  All other systems were reviewed with the patient and are negative.  PHYSICAL EXAMINATION: ECOG PERFORMANCE STATUS: 2 - Symptomatic, <50% confined to bed  Vitals:   05/29/19 1142  BP: (!) 142/88  Pulse: 84  Resp: 17  Temp: 98 F (36.7 C)  SpO2: 94%   Filed Weights   05/29/19 1142  Weight: 204 lb (92.5 kg)    GENERAL:alert, no distress and comfortable SKIN: skin color, texture, turgor are normal, no rashes or significant lesions EYES: normal, Conjunctiva are pink and non-injected, sclera clear OROPHARYNX:no exudate, no erythema and lips, buccal mucosa, and tongue normal  NECK: supple, thyroid normal size, non-tender, without nodularity LYMPH:  no palpable lymphadenopathy in the cervical, axillary or inguinal LUNGS: clear to auscultation and percussion with normal breathing effort HEART: regular rate & rhythm and no murmurs and no lower extremity edema.  Varicose veins are noted ABDOMEN:abdomen soft, non-tender and normal bowel sounds Musculoskeletal:no cyanosis of digits and no clubbing  NEURO: alert & oriented x 3 with fluent speech, no focal motor/sensory deficits  LABORATORY DATA:  I have reviewed the data as listed     Component Value Date/Time   NA 135 01/10/2019 2254   K 3.9 01/10/2019 2254   CL 107 01/10/2019 2254   CO2 21 (L) 01/10/2019 2254   GLUCOSE 98 01/10/2019 2254   BUN 21 01/10/2019 2254    CREATININE 0.65 01/10/2019 2254   CALCIUM 8.1 (L) 01/10/2019 2254   PROT 5.9 (L) 01/10/2019 2254   ALBUMIN 3.4 (L) 01/10/2019 2254   AST 14 (L) 01/10/2019 2254   ALT 12 01/10/2019 2254   ALKPHOS 49 01/10/2019 2254   BILITOT 0.4 01/10/2019 2254   GFRNONAA >60 01/10/2019 2254   GFRAA >60 01/10/2019 2254    No results found for: SPEP, UPEP  Lab Results  Component Value Date   WBC 11.5 (H) 01/10/2019   NEUTROABS 7.2 01/10/2019   HGB 10.8 (L) 01/10/2019   HCT 34.4 (L) 01/10/2019   MCV 100.9 (H) 01/10/2019   PLT 199 01/10/2019      Chemistry      Component Value Date/Time   NA 135 01/10/2019 2254   K 3.9 01/10/2019 2254   CL 107 01/10/2019 2254   CO2 21 (L) 01/10/2019 2254   BUN 21 01/10/2019 2254   CREATININE 0.65  01/10/2019 2254      Component Value Date/Time   CALCIUM 8.1 (L) 01/10/2019 2254   ALKPHOS 49 01/10/2019 2254   AST 14 (L) 01/10/2019 2254   ALT 12 01/10/2019 2254   BILITOT 0.4 01/10/2019 2254       I spent 15 minutes counseling the patient face to face. The total time spent in the appointment was 20 minutes and more than 50% was on counseling.   All questions were answered. The patient knows to call the clinic with any problems, questions or concerns. No barriers to learning was detected.    Heath Lark, MD 6/16/202012:19 PM

## 2019-05-29 NOTE — Assessment & Plan Note (Signed)
She has essential tremors on beta-blocker She also have unsteady gait and is being followed by neurologist

## 2019-05-29 NOTE — Assessment & Plan Note (Signed)
This last episode of blood clot appeared to be provoked.  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 Her current anticoagulation therapy will interfere with the tests and it is not possible to interpret the test results.  Taking her off the anticoagulation therapy to do the tests may precipitate another thrombotic event. I do not see a reason to order excessive testing to screen for thrombophilia disorder as it would not change our management.  The goal of anticoagulation therapy is for life.   I also reviewed the rationale of not pursuing thrombophilia screening test to look for inheritable blood clotting disorder because it will not benefit her children.  Even if her children tested positive, it is not a reason to put them on anticoagulation therapy.  For blood test that is not going to change management, it is not going to be helpful  For now, I recommend the patient to continue her treatment with Eliquis indefinitely  We discussed the importance of monitoring for signs and symptoms of bleeding She will continue close follow-up with primary care doctor for blood count monitoring and kidney function check while on Eliquis She does not need return visit.  I will see her back if perioperative anticoagulation management is needed for elective surgery Another reason to discontinue anticoagulation treatment would be recurrent falls or excessive bleeding

## 2019-06-14 DIAGNOSIS — N3946 Mixed incontinence: Secondary | ICD-10-CM | POA: Diagnosis not present

## 2019-06-14 DIAGNOSIS — N3020 Other chronic cystitis without hematuria: Secondary | ICD-10-CM | POA: Diagnosis not present

## 2019-06-14 DIAGNOSIS — N390 Urinary tract infection, site not specified: Secondary | ICD-10-CM | POA: Diagnosis not present

## 2019-07-18 DIAGNOSIS — M859 Disorder of bone density and structure, unspecified: Secondary | ICD-10-CM | POA: Diagnosis not present

## 2019-07-18 DIAGNOSIS — I10 Essential (primary) hypertension: Secondary | ICD-10-CM | POA: Diagnosis not present

## 2019-07-18 DIAGNOSIS — R7301 Impaired fasting glucose: Secondary | ICD-10-CM | POA: Diagnosis not present

## 2019-07-20 DIAGNOSIS — R82998 Other abnormal findings in urine: Secondary | ICD-10-CM | POA: Diagnosis not present

## 2019-07-20 DIAGNOSIS — I10 Essential (primary) hypertension: Secondary | ICD-10-CM | POA: Diagnosis not present

## 2019-07-24 DIAGNOSIS — I10 Essential (primary) hypertension: Secondary | ICD-10-CM | POA: Diagnosis not present

## 2019-07-24 DIAGNOSIS — F419 Anxiety disorder, unspecified: Secondary | ICD-10-CM | POA: Diagnosis not present

## 2019-07-24 DIAGNOSIS — R7301 Impaired fasting glucose: Secondary | ICD-10-CM | POA: Diagnosis not present

## 2019-07-24 DIAGNOSIS — F3342 Major depressive disorder, recurrent, in full remission: Secondary | ICD-10-CM | POA: Diagnosis not present

## 2019-07-24 DIAGNOSIS — Z86711 Personal history of pulmonary embolism: Secondary | ICD-10-CM | POA: Diagnosis not present

## 2019-07-24 DIAGNOSIS — Z1331 Encounter for screening for depression: Secondary | ICD-10-CM | POA: Diagnosis not present

## 2019-07-24 DIAGNOSIS — R1312 Dysphagia, oropharyngeal phase: Secondary | ICD-10-CM | POA: Diagnosis not present

## 2019-07-24 DIAGNOSIS — G3184 Mild cognitive impairment, so stated: Secondary | ICD-10-CM | POA: Diagnosis not present

## 2019-07-24 DIAGNOSIS — G4700 Insomnia, unspecified: Secondary | ICD-10-CM | POA: Diagnosis not present

## 2019-07-24 DIAGNOSIS — Z0000 Encounter for general adult medical examination without abnormal findings: Secondary | ICD-10-CM | POA: Diagnosis not present

## 2019-07-24 DIAGNOSIS — Z7901 Long term (current) use of anticoagulants: Secondary | ICD-10-CM | POA: Diagnosis not present

## 2019-07-24 DIAGNOSIS — M8580 Other specified disorders of bone density and structure, unspecified site: Secondary | ICD-10-CM | POA: Diagnosis not present

## 2019-08-03 ENCOUNTER — Other Ambulatory Visit: Payer: Self-pay

## 2019-08-03 ENCOUNTER — Ambulatory Visit (INDEPENDENT_AMBULATORY_CARE_PROVIDER_SITE_OTHER): Payer: Medicare Other | Admitting: Podiatry

## 2019-08-03 ENCOUNTER — Encounter: Payer: Self-pay | Admitting: Podiatry

## 2019-08-03 VITALS — Temp 98.0°F

## 2019-08-03 DIAGNOSIS — B351 Tinea unguium: Secondary | ICD-10-CM

## 2019-08-03 DIAGNOSIS — M79674 Pain in right toe(s): Secondary | ICD-10-CM

## 2019-08-03 DIAGNOSIS — M79675 Pain in left toe(s): Secondary | ICD-10-CM

## 2019-08-03 NOTE — Patient Instructions (Signed)

## 2019-08-12 NOTE — Progress Notes (Signed)
Subjective:  [name] presents to clinic today with cc of  painful, thick, discolored, elongated toenails 1-5 b/l that become tender and cannot cut because of thickness.  Pain is aggravated when wearing enclosed shoe gear.  [name], MD is her PCP.   She is also on blood thinner, Eliquis.   Current Outpatient Medications:  .  amLODipine (NORVASC) 2.5 MG tablet, , Disp: , Rfl:  .  apixaban (ELIQUIS) 5 MG TABS tablet, Take 10 mg (Two 5 mg Tablets) po BID for 7 days and then Take 5 mg po BID onwards, Disp: 70 tablet, Rfl: 0 .  aspirin EC 81 MG tablet, Take 81 mg by mouth at bedtime., Disp: , Rfl:  .  busPIRone (BUSPAR) 10 MG tablet, Take 10 mg by mouth daily., Disp: , Rfl:  .  carvedilol (COREG) 6.25 MG tablet, , Disp: , Rfl:  .  Cholecalciferol (VITAMIN D3) 2000 units capsule, Take 1 capsule by mouth at bedtime., Disp: , Rfl:  .  Cyanocobalamin (VITAMIN B-12 PO), Take 1,000 mcg by mouth daily. Dissolvable tablet, Disp: , Rfl:  .  donepezil (ARICEPT) 5 MG tablet, Take 5 mg by mouth daily., Disp: , Rfl:  .  escitalopram (LEXAPRO) 20 MG tablet, Take 20 mg by mouth daily. , Disp: , Rfl:  .  esomeprazole (NEXIUM) 40 MG capsule, Take 40 mg by mouth at bedtime. , Disp: , Rfl:  .  furosemide (LASIX) 20 MG tablet, Take 20 mg by mouth as needed for edema. , Disp: , Rfl:  .  levofloxacin (LEVAQUIN) 500 MG tablet, levofloxacin 500 mg tablet, Disp: , Rfl:  .  loratadine (CLARITIN) 10 MG tablet, Take 10 mg by mouth at bedtime., Disp: , Rfl:  .  mirabegron ER (MYRBETRIQ) 50 MG TB24 tablet, Take 50 mg by mouth daily. , Disp: , Rfl:  .  nitrofurantoin (MACRODANTIN) 100 MG capsule, , Disp: , Rfl:  .  olmesartan (BENICAR) 20 MG tablet, Take 20 mg by mouth daily., Disp: , Rfl:  .  propranolol (INDERAL) 20 MG tablet, TAKE ONE TABLET BY MOUTH TWICE A DAY, Disp: 60 tablet, Rfl: 5 .  saccharomyces boulardii (FLORASTOR) 250 MG capsule, Take 250 mg by mouth 2 (two) times daily., Disp: , Rfl:  .   TOVIAZ 8 MG TB24 tablet, Take 8 mg by mouth daily. , Disp: , Rfl:  .  trimethoprim (TRIMPEX) 100 MG tablet, Take 100 mg by mouth daily. , Disp: , Rfl:    Allergies  Allergen Reactions  . Buprenorphine Hcl Nausea And Vomiting  . Codeine Nausea And Vomiting    Other reaction(s): Other (See Comments) But tolerates Tramadol But tolerates Tramadol Vomiting   . Morphine And Related Nausea And Vomiting  . Morphine     Other reaction(s): Other (See Comments) Vomiting  . Procaine     Other reaction(s): Other (See Comments) Other reaction(s): Shakes Novacaine-Shakes     Objective: Vitals:   08/03/19 1045  Temp: 98 F (36.7 C)    Physical Examination: 78 y.o. Caucasian female, elderly, in NAD. AAO x 3.   Vascular Examination: Capillary refill time immediate x 10 digits.  Palpable DP/PT pulses b/l.  Digital hair present b/l.  No edema noted b/l.  Skin temperature gradient WNL b/l.  Dermatological Examination: Skin with normal turgor, texture and tone b/l.  No open wounds b/l.  No interdigital macerations noted b/l.  Elongated, thick, discolored brittle toenails with subungual debris and pain on dorsal palpation of nailbeds 1-5 b/l.  Musculoskeletal Examination: Muscle strength 5/5 to all muscle groups b/l  No pain, crepitus or joint discomfort with active/passive ROM.  Neurological Examination: Sensation intact 5/5 b/l with 10 gram monofilament.  Vibratory sensation intact b/l.  Proprioceptive sensation intact b/l.  Assessment: Mycotic nail infection with pain 1-5 b/l  Plan: 1. Toenails 1-5 b/l were debrided in length and girth without iatrogenic laceration. 2.  Continue soft, supportive shoe gear daily. 3.  Report any pedal injuries to medical professional. 4.  Follow up 3 months. 5.  Patient/POA to call should there be a question/concern in there interim.

## 2019-08-17 DIAGNOSIS — Z23 Encounter for immunization: Secondary | ICD-10-CM | POA: Diagnosis not present

## 2019-08-30 ENCOUNTER — Other Ambulatory Visit: Payer: Self-pay | Admitting: Internal Medicine

## 2019-08-30 DIAGNOSIS — H3500 Unspecified background retinopathy: Secondary | ICD-10-CM | POA: Diagnosis not present

## 2019-08-30 DIAGNOSIS — H04223 Epiphora due to insufficient drainage, bilateral lacrimal glands: Secondary | ICD-10-CM | POA: Diagnosis not present

## 2019-08-30 DIAGNOSIS — Z1231 Encounter for screening mammogram for malignant neoplasm of breast: Secondary | ICD-10-CM

## 2019-08-30 DIAGNOSIS — H04123 Dry eye syndrome of bilateral lacrimal glands: Secondary | ICD-10-CM | POA: Diagnosis not present

## 2019-08-30 DIAGNOSIS — H5201 Hypermetropia, right eye: Secondary | ICD-10-CM | POA: Diagnosis not present

## 2019-09-03 DIAGNOSIS — I10 Essential (primary) hypertension: Secondary | ICD-10-CM | POA: Diagnosis not present

## 2019-09-03 DIAGNOSIS — R3981 Functional urinary incontinence: Secondary | ICD-10-CM | POA: Diagnosis not present

## 2019-09-03 DIAGNOSIS — F418 Other specified anxiety disorders: Secondary | ICD-10-CM | POA: Diagnosis not present

## 2019-09-03 DIAGNOSIS — M1990 Unspecified osteoarthritis, unspecified site: Secondary | ICD-10-CM | POA: Diagnosis not present

## 2019-10-06 ENCOUNTER — Other Ambulatory Visit: Payer: Self-pay | Admitting: Neurology

## 2019-10-16 ENCOUNTER — Other Ambulatory Visit: Payer: Self-pay

## 2019-10-16 ENCOUNTER — Ambulatory Visit
Admission: RE | Admit: 2019-10-16 | Discharge: 2019-10-16 | Disposition: A | Discharge status: Home / Self Care | Payer: Medicare Other | Source: Ambulatory Visit | Attending: Internal Medicine | Admitting: Internal Medicine

## 2019-10-16 DIAGNOSIS — Z1231 Encounter for screening mammogram for malignant neoplasm of breast: Secondary | ICD-10-CM | POA: Diagnosis not present

## 2019-10-19 ENCOUNTER — Ambulatory Visit: Payer: Medicare Other | Admitting: Podiatry

## 2019-10-22 ENCOUNTER — Ambulatory Visit: Payer: Medicare Other | Admitting: Podiatry

## 2019-10-26 ENCOUNTER — Ambulatory Visit: Payer: Medicare Other | Admitting: Podiatry

## 2019-11-02 ENCOUNTER — Ambulatory Visit (INDEPENDENT_AMBULATORY_CARE_PROVIDER_SITE_OTHER): Payer: Medicare Other | Admitting: Podiatry

## 2019-11-02 ENCOUNTER — Other Ambulatory Visit: Payer: Self-pay

## 2019-11-02 ENCOUNTER — Encounter: Payer: Self-pay | Admitting: Podiatry

## 2019-11-02 DIAGNOSIS — M79675 Pain in left toe(s): Secondary | ICD-10-CM

## 2019-11-02 DIAGNOSIS — M79674 Pain in right toe(s): Secondary | ICD-10-CM | POA: Diagnosis not present

## 2019-11-02 DIAGNOSIS — B351 Tinea unguium: Secondary | ICD-10-CM | POA: Diagnosis not present

## 2019-11-02 DIAGNOSIS — D689 Coagulation defect, unspecified: Secondary | ICD-10-CM | POA: Diagnosis not present

## 2019-11-02 NOTE — Progress Notes (Signed)
  Subjective:  Patient ID: [name], female    DOB: 1941-03-01,  MRN: [id number]  Chief Complaint  Patient presents with  . Foot Pain    pt is here for routine foot care, pt is currently on eliquis as well, pt has no ther comments or concerns   78 y.o. female returns for the above complaint.  Patient is a nondiabetic with painful elongated thickened toenails.  Like for the toenails to be reduced in girth's with debridement.  She states that they are hurting when she is ambulating.  She denies any other acute complaints.  She denies any nausea fever chills vomiting.  Objective:  There were no vitals filed for this visit. Podiatric Exam: Vascular: dorsalis pedis and posterior tibial pulses are palpable bilateral. Capillary return is immediate. Temperature gradient is WNL. Skin turgor WNL  Sensorium: Normal Semmes Weinstein monofilament test. Normal tactile sensation bilaterally. Nail Exam: Pt has thick disfigured discolored nails with subungual debris noted bilateral entire nail hallux through fifth toenails Ulcer Exam: There is no evidence of ulcer or pre-ulcerative changes or infection. Orthopedic Exam: Muscle tone and strength are WNL. No limitations in general ROM. No crepitus or effusions noted. HAV  B/L.  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 Skin: No Porokeratosis. No infection or ulcers  Assessment & Plan:  Patient was evaluated and treated and all questions answered.  Onychomycosis with pain  -Nails palliatively debrided as below. -Educated on self-care  Procedure: Nail Debridement Rationale: pain  Type of Debridement: manual, sharp debridement. Instrumentation: Nail nipper, rotary burr. Number of Nails: 10  Procedures and Treatment: Consent by patient was obtained for treatment procedures. The patient understood the discussion of treatment and procedures well. All questions were answered thoroughly reviewed. Debridement of mycotic and hypertrophic toenails, 1 through 5 bilateral and  clearing of subungual debris. No ulceration, no infection noted.  Return Visit-Office Procedure: Patient instructed to return to the office for a follow up visit 3 months for continued evaluation and treatment.  Boneta Lucks, DPM    No follow-ups on file.

## 2019-11-22 DIAGNOSIS — N3020 Other chronic cystitis without hematuria: Secondary | ICD-10-CM | POA: Diagnosis not present

## 2019-11-22 DIAGNOSIS — N3946 Mixed incontinence: Secondary | ICD-10-CM | POA: Diagnosis not present

## 2019-12-25 ENCOUNTER — Encounter: Payer: Self-pay | Admitting: Neurology

## 2019-12-25 NOTE — Progress Notes (Signed)
Virtual Visit via Video Note (changed to audio because tried 2 different platforms and patient was unable to do a video) The purpose of this virtual visit is to provide medical care while limiting exposure to the novel coronavirus.    Consent was obtained for telephone visit:  Yes.   Answered questions that patient had about telephone interaction:  Yes.   I discussed the limitations, risks, security and privacy concerns of performing an evaluation and management service by telephone. I also discussed with the patient that there may be a patient responsible charge related to this service. The patient expressed understanding and agreed to proceed.  Pt location: Home Physician Location: office Name of referring provider:  [name], MD I connected with [name] at patients initiation/request on 12/27/2019 at 11:15 AM EST by telephone and verified that I am speaking with the correct person using two identifiers. Pt MRN:  [id number] Pt DOB:  01-08-1941 Video Participants:  [name];    History of Present Illness:  Patient is seen today in follow-up. Outside records that were made available to me were reviewed, as well as my own records.  She is currently on propranolol, 20 mg twice per day.  States that tremor has gotten worse but "I know that I can't take the primidone."  She asked me last visit about going back on the primidone, but I told her not as long as she was on the Eliquis.  She did see Dr. Alvy Bimler a few months after that visit and she did tell the patient that she is going to be on Eliquis indefinitely. States that when she eats breakfast, she can feel her entire body tremor.  "if I really relax, it will calm down a little bit."  States that BP been running mid 130's/80's.    Reports that she did have covid and just got out of quarantine 2 days ago.  "I didn't have a bad case."  States that they did check her BP every day in quarantine.  Husband did get covid, was  hospitalized and now in SNF, potentially permanently.  Current Outpatient Medications on File Prior to Visit  Medication Sig Dispense Refill  . amLODipine (NORVASC) 2.5 MG tablet     . apixaban (ELIQUIS) 5 MG TABS tablet Take 10 mg (Two 5 mg Tablets) po BID for 7 days and then Take 5 mg po BID onwards 70 tablet 0  . aspirin EC 81 MG tablet Take 81 mg by mouth at bedtime.    . busPIRone (BUSPAR) 10 MG tablet Take 10 mg by mouth daily.    . carvedilol (COREG) 6.25 MG tablet     . Cholecalciferol (VITAMIN D3) 2000 units capsule Take 1 capsule by mouth at bedtime.    . Cyanocobalamin (VITAMIN B-12 PO) Take 1,000 mcg by mouth daily. Dissolvable tablet    . donepezil (ARICEPT) 5 MG tablet Take 5 mg by mouth daily.    Marland Kitchen escitalopram (LEXAPRO) 20 MG tablet Take 20 mg by mouth daily.     Marland Kitchen esomeprazole (NEXIUM) 40 MG capsule Take 40 mg by mouth at bedtime.     . furosemide (LASIX) 20 MG tablet Take 20 mg by mouth as needed for edema.     Marland Kitchen levofloxacin (LEVAQUIN) 500 MG tablet levofloxacin 500 mg tablet    . loratadine (CLARITIN) 10 MG tablet Take 10 mg by mouth at bedtime.    . mirabegron ER (MYRBETRIQ) 50 MG TB24 tablet Take 50 mg by mouth  daily.     . nitrofurantoin (MACRODANTIN) 100 MG capsule     . olmesartan (BENICAR) 20 MG tablet Take 20 mg by mouth daily.    . propranolol (INDERAL) 20 MG tablet TAKE ONE TABLET BY MOUTH TWICE A DAY 180 tablet 4  . saccharomyces boulardii (FLORASTOR) 250 MG capsule Take 250 mg by mouth 2 (two) times daily.    . TOVIAZ 8 MG TB24 tablet Take 8 mg by mouth daily.     Marland Kitchen trimethoprim (TRIMPEX) 100 MG tablet Take 100 mg by mouth daily.      No current facility-administered medications on file prior to visit.      Observations/Objective:   Vitals:   12/25/19 1555  Weight: 197 lb (89.4 kg)  Height: 5' (1.524 m)   GEN:  The patient appears stated age and is in NAD.  Neurological examination:  Her speech is fluent and clear.    Assessment and Plan:     1.  Essential tremor.             -continue Propranolol, 20 mg bid but going to call pcp to see if he still has her on coreg (pt wasn't sure).  If so, I want to make sure he is okay with this.  She will call me back if she doesn't hear from [place] by next week  -Discussed again that she cannot be on the primidone because of Eliquis.  There are lifelong plans for her to stay on the Eliquis per Dr. Alvy Bimler.  -Talked to her about the option of weighted gloves/wrist weight to help her with test such as brushing her teeth.             -has evidence of peripheral neuropathy.  Cancelled her EMG in the past.  -Discussed Covid vaccine with the patient.  She just had covid 2..  Hx AVM repair with coiling             -She does have an upgoing toe on the right, which I think is likely just a result of her history of AVM, which she reports was on the left.  I see nothing else focal or lateralizing on her examination today, so decided to hold off on any further neuroimaging.               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 3.  Trigeminal neuralgia             -no longer having sx 4.  OSAS             -On CPAP faithfully  5  Anxiety and depression             -She had neuropsych testing on 09/22/2016.   As suspected, there was no evidence of dementia.  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   6.  Recurrent DVT/PE             -seeing Dr. Alvy Bimler             -on eliquis.  As above, if she is able to changed to Pradaxa, primidone could be an option again.  Follow Up Instructions:    -I discussed the assessment and treatment plan with the patient. The patient was provided an opportunity to ask  questions and all were answered. The patient agreed with the plan  and demonstrated an understanding of the instructions.   The patient was advised to call back or seek an in-person evaluation if the symptoms worsen or if the condition fails to improve as anticipated.   Total time spent on today's visit was 20 minutes, including both face-to-face time and nonface-to-face time.  Time included that spent on review of records (prior notes available to me/labs/imaging if pertinent), discussing treatment and goals, answering patient's questions and coordinating care.    Alonza Bogus, DO

## 2019-12-27 ENCOUNTER — Telehealth: Payer: Self-pay

## 2019-12-27 ENCOUNTER — Telehealth (INDEPENDENT_AMBULATORY_CARE_PROVIDER_SITE_OTHER): Payer: Medicare Other | Admitting: Neurology

## 2019-12-27 ENCOUNTER — Other Ambulatory Visit: Payer: Self-pay

## 2019-12-27 VITALS — Ht 60.0 in | Wt 197.0 lb

## 2019-12-27 DIAGNOSIS — I2782 Chronic pulmonary embolism: Secondary | ICD-10-CM

## 2019-12-27 DIAGNOSIS — G250 Essential tremor: Secondary | ICD-10-CM | POA: Diagnosis not present

## 2019-12-27 NOTE — Telephone Encounter (Signed)
Called pt PCP waiting for return call

## 2019-12-28 ENCOUNTER — Telehealth: Payer: Self-pay

## 2019-12-28 NOTE — Telephone Encounter (Signed)
Left message again today for dr Raul Del nurse to call back to ask if they have her on coreg (its on her list) as I have her on propranolol (and have for a long time). See if Dr. [name] is okay with both or wants to just use one (the propranolol is for tremor).

## 2020-01-01 DIAGNOSIS — I10 Essential (primary) hypertension: Secondary | ICD-10-CM | POA: Diagnosis not present

## 2020-01-03 DIAGNOSIS — R350 Frequency of micturition: Secondary | ICD-10-CM | POA: Diagnosis not present

## 2020-01-04 ENCOUNTER — Ambulatory Visit (INDEPENDENT_AMBULATORY_CARE_PROVIDER_SITE_OTHER): Payer: Medicare Other | Admitting: Podiatry

## 2020-01-04 ENCOUNTER — Encounter: Payer: Self-pay | Admitting: Podiatry

## 2020-01-04 ENCOUNTER — Other Ambulatory Visit: Payer: Self-pay

## 2020-01-04 DIAGNOSIS — M79674 Pain in right toe(s): Secondary | ICD-10-CM | POA: Diagnosis not present

## 2020-01-04 DIAGNOSIS — B351 Tinea unguium: Secondary | ICD-10-CM

## 2020-01-04 DIAGNOSIS — D689 Coagulation defect, unspecified: Secondary | ICD-10-CM | POA: Diagnosis not present

## 2020-01-04 DIAGNOSIS — M79675 Pain in left toe(s): Secondary | ICD-10-CM | POA: Diagnosis not present

## 2020-01-04 NOTE — Progress Notes (Signed)
  Subjective:  Patient ID: [name], female    DOB: 05-03-1941,  MRN: [id number]  Chief Complaint  Patient presents with  . Nail Problem    pt is here for a 9 week nail trim, pt is also currently on eliquis   79 y.o. female returns for the above complaint.  Patient presents with a follow-up with thickened elongated mycotic toenails that has been causing a lot of pain.  Patient has not been able to debride them.  She states that she would like to have them debrided is causing her pain when ambulating.  She denies any other acute complaints.  She denies any other pain in her foot and ankle.  Objective:  There were no vitals filed for this visit. Podiatric Exam: Vascular: dorsalis pedis and posterior tibial pulses are palpable bilateral. Capillary return is immediate. Temperature gradient is WNL. Skin turgor WNL  Sensorium: Normal Semmes Weinstein monofilament test. Normal tactile sensation bilaterally. Nail Exam: Pt has thick disfigured discolored nails with subungual debris noted bilateral entire nail hallux through fifth toenails Ulcer Exam: There is no evidence of ulcer or pre-ulcerative changes or infection. Orthopedic Exam: Muscle tone and strength are WNL. No limitations in general ROM. No crepitus or effusions noted. HAV  B/L.  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 Skin: No Porokeratosis. No infection or ulcers  Assessment & Plan:  Patient was evaluated and treated and all questions answered.  Onychomycosis with pain  -Nails palliatively debrided as below. -Educated on self-care  Procedure: Nail Debridement Rationale: pain  Type of Debridement: manual, sharp debridement. Instrumentation: Nail nipper, rotary burr. Number of Nails: 10  Procedures and Treatment: Consent by patient was obtained for treatment procedures. The patient understood the discussion of treatment and procedures well. All questions were answered thoroughly reviewed. Debridement of mycotic and hypertrophic toenails, 1  through 5 bilateral and clearing of subungual debris. No ulceration, no infection noted.  Return Visit-Office Procedure: Patient instructed to return to the office for a follow up visit 3 months for continued evaluation and treatment.  Boneta Lucks, DPM    No follow-ups on file.

## 2020-01-07 ENCOUNTER — Telehealth: Payer: Self-pay | Admitting: Neurology

## 2020-01-07 NOTE — Telephone Encounter (Signed)
Patient left msg with after hours wanting to know if she should be taking her medication. She did not give name of med. Thanks!

## 2020-01-07 NOTE — Telephone Encounter (Signed)
Seems that per patients chart you had been trying to handle something with patients medications, please advise patient.

## 2020-01-09 NOTE — Telephone Encounter (Signed)
Left message again today for dr Raul Del nurse to call back to ask if they have her on coreg (its on her list) as I have her on propranolol (and have for a long time). See if Dr. [name] is okay with both or wants to just use one (the propranolol is for tremor).  Waiting for return call to see if they want her on both medications

## 2020-01-09 NOTE — Telephone Encounter (Signed)
spoke with Dr [name] nurse Mrs Llorente is only on the Propranolol i will fix her medication list they are calling to talk to the pt to make sure she has not been taken the Coreg because they have told her over and over not to take it

## 2020-01-10 DIAGNOSIS — R350 Frequency of micturition: Secondary | ICD-10-CM | POA: Diagnosis not present

## 2020-02-01 DIAGNOSIS — D6869 Other thrombophilia: Secondary | ICD-10-CM | POA: Diagnosis not present

## 2020-02-01 DIAGNOSIS — M25561 Pain in right knee: Secondary | ICD-10-CM | POA: Diagnosis not present

## 2020-02-01 DIAGNOSIS — F3342 Major depressive disorder, recurrent, in full remission: Secondary | ICD-10-CM | POA: Diagnosis not present

## 2020-02-01 DIAGNOSIS — I10 Essential (primary) hypertension: Secondary | ICD-10-CM | POA: Diagnosis not present

## 2020-02-01 DIAGNOSIS — M8580 Other specified disorders of bone density and structure, unspecified site: Secondary | ICD-10-CM | POA: Diagnosis not present

## 2020-02-01 DIAGNOSIS — G250 Essential tremor: Secondary | ICD-10-CM | POA: Diagnosis not present

## 2020-02-01 DIAGNOSIS — G3184 Mild cognitive impairment, so stated: Secondary | ICD-10-CM | POA: Diagnosis not present

## 2020-02-01 DIAGNOSIS — Z86711 Personal history of pulmonary embolism: Secondary | ICD-10-CM | POA: Diagnosis not present

## 2020-02-01 DIAGNOSIS — M5416 Radiculopathy, lumbar region: Secondary | ICD-10-CM | POA: Diagnosis not present

## 2020-02-04 DIAGNOSIS — M8580 Other specified disorders of bone density and structure, unspecified site: Secondary | ICD-10-CM | POA: Diagnosis not present

## 2020-02-04 DIAGNOSIS — M810 Age-related osteoporosis without current pathological fracture: Secondary | ICD-10-CM | POA: Diagnosis not present

## 2020-02-06 ENCOUNTER — Other Ambulatory Visit: Payer: Self-pay

## 2020-02-06 ENCOUNTER — Ambulatory Visit (INDEPENDENT_AMBULATORY_CARE_PROVIDER_SITE_OTHER): Payer: Medicare Other

## 2020-02-06 ENCOUNTER — Ambulatory Visit (INDEPENDENT_AMBULATORY_CARE_PROVIDER_SITE_OTHER): Payer: Medicare Other | Admitting: Orthopaedic Surgery

## 2020-02-06 ENCOUNTER — Ambulatory Visit: Payer: Self-pay

## 2020-02-06 ENCOUNTER — Encounter: Payer: Self-pay | Admitting: Orthopaedic Surgery

## 2020-02-06 DIAGNOSIS — M5441 Lumbago with sciatica, right side: Secondary | ICD-10-CM | POA: Diagnosis not present

## 2020-02-06 DIAGNOSIS — M5442 Lumbago with sciatica, left side: Secondary | ICD-10-CM

## 2020-02-06 DIAGNOSIS — M25561 Pain in right knee: Secondary | ICD-10-CM

## 2020-02-06 DIAGNOSIS — M25562 Pain in left knee: Secondary | ICD-10-CM

## 2020-02-06 DIAGNOSIS — G8929 Other chronic pain: Secondary | ICD-10-CM

## 2020-02-06 DIAGNOSIS — M4807 Spinal stenosis, lumbosacral region: Secondary | ICD-10-CM

## 2020-02-06 NOTE — Progress Notes (Signed)
Office Visit Note   Patient: [name]           Date of Birth: 1941/12/04           MRN: [id number] Visit Date: 02/06/2020              Requested by: [name], MD [address] [place],  [place] [address] PCP: [name], MD   Assessment & Plan: Visit Diagnoses:  1. Chronic pain of left knee   2. Chronic pain of right knee   3. Chronic bilateral low back pain with bilateral sciatica     Plan: I am concerned about the patient being a fall risk.  She would definitely benefit from physical therapy to work on her balance and coordination and strengthening in general of her lower extremities and any modalities that can help with her back.  She is on Eliquis.  She cannot take anti-inflammatories.  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  She agrees with this treatment plan.  I gave her a note for therapy.  She does stay at this facility which can hopefully help arrange her getting some therapy because it is difficult for her to get an outpatient therapy placed.  Hopefully we will see her back in 2 weeks with an MRI of her lumbar spine to then best determine what treatment we can proceed with from there.  Follow-Up Instructions: Return in about 2 weeks (around 02/20/2020).   Orders:  Orders Placed This Encounter  Procedures  . XR Knee 1-2 Views Left  . XR Knee 1-2 Views Right  . XR Lumbar Spine 2-3 Views   No orders of the defined types were placed in this encounter.     Procedures: No procedures performed   Clinical Data: No additional findings.   Subjective: Chief Complaint  Patient presents with  . Lower Back - Pain  . Left Knee - Pain  . Right Knee - Pain  The patient is a 79 year old female who comes in for evaluation treatment of low back pain and mainly left leg pain that goes all the way down to her calf.  She does have bilateral knee pain as well.   She stays in nursing facility.  She has problems with balance and coordination and is a fall risk.  She does have a remote history of lumbar spine surgery.  She says is been getting worse for some time now and seems like her lumbar spine is more of her issue.  She is a patient of Dr. [name] of [facility].  He is center this way.  She does take Eliquis as well.  HPI  Review of Systems She currently denies any headache, chest pain, shortness of breath, fever, chills, nausea, vomiting  Objective: Vital Signs: There were no vitals taken for this visit.  Physical Exam She is alert and orient x3 and in no acute distress.  She is moderately obese.  She does have an essential tremor. Ortho Exam Examination of both knees show fluid range of motion of both knees.  There is not a lot of pain around either knee.  They both feel ligaments are stable.  She does have a positive straight leg raise to the left side.  She has limited mobility of the lumbar spine.  She is definitely not steady on her feet. Specialty Comments:  No specialty comments  available.  Imaging: XR Knee 1-2 Views Left  Result Date: 02/06/2020 2 views of the left knee show no acute findings.  XR Knee 1-2 Views Right  Result Date: 02/06/2020 2 views of the right knee showed no acute findings.  There is mild amount arthritic changes.  The bone is osteopenic.  XR Lumbar Spine 2-3 Views  Result Date: 02/06/2020 2 views of the lumbar spine show a significant spondylolisthesis at L4 on L5 which is at least grade 1.    PMFS History: Patient Active Problem List   Diagnosis Date Noted  . Depression 05/25/2019  . Skin cancer 05/25/2019  . Metatarsalgia of right foot 08/04/2018  . Pulmonary embolus ([place]) 05/19/2018  . Pulmonary embolism ([place]) 05/18/2018  . HTN (hypertension) 05/18/2018  . Lumbar radiculopathy 11/13/2016  . Hallux rigidus of right foot 05/19/2016  . Onychomycosis of left great toe 05/19/2016    . Gait disturbance 04/05/2016  . Lumbar spinal stenosis 02/20/2016  . Claudication ([place]) 02/03/2016  . Ectropion of both eyes 04/07/2015  . 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  . Vitreous syneresis of both eyes 04/07/2015  . Sleep apnea, obstructive 07/29/2014  . Acute laryngitis 07/24/2014  . Allergic rhinitis 07/24/2014  . Hearing loss 07/24/2014  . Hip osteoarthritis 07/24/2014  . Hoarseness 07/24/2014  . Lumbar degenerative disc disease 07/24/2014  . Lumbar spondylosis 07/24/2014  . 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 Urinary incontinence in female 07/24/2014  . Epiphora due to insufficient drainage, bilateral 05/23/2014  . Essential tremor 04/24/2014  . Conjunctivochalasis 03/09/2012  . Dermatochalasis 03/09/2012  . Hyperopia with astigmatism and presbyopia 03/09/2012  . Pinguecula of both eyes 03/09/2012   Past Medical History:  Diagnosis Date  . 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 Benign familial tremor   . Clostridium difficile infection   . DDD (degenerative disc disease), lumbar   . Diverticulosis   . DVT (deep venous thrombosis) ([facility])    left leg  . Gallstones   . GERD (gastroesophageal reflux disease)   . Hypertension   . Osteoporosis   . Wears glasses     Family History  Problem Relation Age of Onset  . Other Mother        sarcoma  . Other Father        broken hip  . Dementia Father   . Pneumonia Father     Past Surgical History:  Procedure Laterality Date  . ABDOMINAL HYSTERECTOMY  1983  . arterial vascular fistula  2004   coiled at [facility]  [phone]  . CARPAL TUNNEL RELEASE  right  . CHOLECYSTECTOMY N/A 03/29/2013   Procedure: LAPAROSCOPIC CHOLECYSTECTOMY WITH INTRAOPERATIVE CHOLANGIOGRAM;  Surgeon: Haywood Lasso, MD;  Location: [facility];  Service: General;  Laterality: N/A;  . EUS N/A 03/09/2013   Procedure: UPPER ENDOSCOPIC ULTRASOUND (EUS) LINEAR;  Surgeon: Beryle Beams, MD;  Location: [facility]  ENDOSCOPY;  Service: Endoscopy;  Laterality: N/A;  . LUMBAR LAMINECTOMY/DECOMPRESSION MICRODISCECTOMY  11/30/2012   Procedure: LUMBAR LAMINECTOMY/DECOMPRESSION MICRODISCECTOMY 2 LEVELS;  Surgeon: Magnus Sinning, MD;  Location: [facility]S;  Service: Orthopedics;  Laterality: N/A;  DECOMPRESSION LAMINECTOMY L4-L5, L5-S1 CENTRAL   . ROTATOR CUFF REPAIR Bilateral 2004   two on right, one on left  . TRIGGER FINGER RELEASE Right     hand   Social History   Occupational History  . Occupation: Futures trader    Comment: partially retired  Tobacco Use  . Smoking  status: Former Smoker    Packs/day: 0.50    Types: Cigarettes    Quit date: 07/11/1970    Years since quitting: 49.6  . Smokeless tobacco: Never Used  Substance and Sexual Activity  . Alcohol use: Yes    Alcohol/week: 0.0 standard drinks    Comment: wine occasionally  . Drug use: No  . Sexual activity: Not Currently

## 2020-02-11 DIAGNOSIS — M4316 Spondylolisthesis, lumbar region: Secondary | ICD-10-CM | POA: Diagnosis not present

## 2020-02-11 DIAGNOSIS — M79605 Pain in left leg: Secondary | ICD-10-CM | POA: Diagnosis not present

## 2020-02-11 DIAGNOSIS — R2689 Other abnormalities of gait and mobility: Secondary | ICD-10-CM | POA: Diagnosis not present

## 2020-02-13 DIAGNOSIS — R2689 Other abnormalities of gait and mobility: Secondary | ICD-10-CM | POA: Diagnosis not present

## 2020-02-13 DIAGNOSIS — M4316 Spondylolisthesis, lumbar region: Secondary | ICD-10-CM | POA: Diagnosis not present

## 2020-02-13 DIAGNOSIS — M79605 Pain in left leg: Secondary | ICD-10-CM | POA: Diagnosis not present

## 2020-02-15 ENCOUNTER — Other Ambulatory Visit: Payer: Self-pay

## 2020-02-15 ENCOUNTER — Telehealth: Payer: Self-pay

## 2020-02-15 ENCOUNTER — Telehealth: Payer: Self-pay | Admitting: Orthopaedic Surgery

## 2020-02-15 DIAGNOSIS — M4316 Spondylolisthesis, lumbar region: Secondary | ICD-10-CM | POA: Diagnosis not present

## 2020-02-15 DIAGNOSIS — G8929 Other chronic pain: Secondary | ICD-10-CM

## 2020-02-15 DIAGNOSIS — M79605 Pain in left leg: Secondary | ICD-10-CM | POA: Diagnosis not present

## 2020-02-15 DIAGNOSIS — M25562 Pain in left knee: Secondary | ICD-10-CM

## 2020-02-15 DIAGNOSIS — R2689 Other abnormalities of gait and mobility: Secondary | ICD-10-CM | POA: Diagnosis not present

## 2020-02-15 NOTE — Telephone Encounter (Signed)
Can we schedule the knee MRI with her Back MRI?

## 2020-02-15 NOTE — Telephone Encounter (Signed)
Pt called in stating she has an appt for an MRI on 02-28-20 that she states took a very long time to be scheduled. Due to the amount of time she had to wait, she states the imaging office recommended a mandatory MRI for left knee and her back as well. Pt states she would like a call back once this message has been viewed.   Home# [phone] Cell# [phone]

## 2020-02-15 NOTE — Telephone Encounter (Signed)
States she wants her left knee MRI'd aswell

## 2020-02-15 NOTE — Telephone Encounter (Signed)
Called [facility]. They cannot do both exams on the 3/18. Next available time would be Sat. 3/27 for both studies same day. [facility] will contact patient with what she wants to do with scheduling.

## 2020-02-15 NOTE — Telephone Encounter (Signed)
Ok for MRI of her lumbar spine and left knee

## 2020-02-16 ENCOUNTER — Other Ambulatory Visit: Payer: Self-pay

## 2020-02-16 ENCOUNTER — Ambulatory Visit
Admission: RE | Admit: 2020-02-16 | Discharge: 2020-02-16 | Disposition: A | Discharge status: Home / Self Care | Payer: Medicare Other | Source: Ambulatory Visit | Attending: Orthopaedic Surgery | Admitting: Orthopaedic Surgery

## 2020-02-16 DIAGNOSIS — S83232A Complex tear of medial meniscus, current injury, left knee, initial encounter: Secondary | ICD-10-CM | POA: Diagnosis not present

## 2020-02-16 DIAGNOSIS — G8929 Other chronic pain: Secondary | ICD-10-CM

## 2020-02-16 DIAGNOSIS — M25562 Pain in left knee: Secondary | ICD-10-CM

## 2020-02-18 DIAGNOSIS — M4316 Spondylolisthesis, lumbar region: Secondary | ICD-10-CM | POA: Diagnosis not present

## 2020-02-18 DIAGNOSIS — M79605 Pain in left leg: Secondary | ICD-10-CM | POA: Diagnosis not present

## 2020-02-18 DIAGNOSIS — R2689 Other abnormalities of gait and mobility: Secondary | ICD-10-CM | POA: Diagnosis not present

## 2020-02-20 ENCOUNTER — Ambulatory Visit: Payer: Medicare Other | Admitting: Physician Assistant

## 2020-02-20 DIAGNOSIS — M4316 Spondylolisthesis, lumbar region: Secondary | ICD-10-CM | POA: Diagnosis not present

## 2020-02-20 DIAGNOSIS — M79605 Pain in left leg: Secondary | ICD-10-CM | POA: Diagnosis not present

## 2020-02-20 DIAGNOSIS — R2689 Other abnormalities of gait and mobility: Secondary | ICD-10-CM | POA: Diagnosis not present

## 2020-02-21 ENCOUNTER — Encounter: Payer: Self-pay | Admitting: Orthopaedic Surgery

## 2020-02-21 ENCOUNTER — Other Ambulatory Visit: Payer: Self-pay

## 2020-02-21 ENCOUNTER — Ambulatory Visit (INDEPENDENT_AMBULATORY_CARE_PROVIDER_SITE_OTHER): Payer: Medicare Other | Admitting: Orthopaedic Surgery

## 2020-02-21 DIAGNOSIS — M4807 Spinal stenosis, lumbosacral region: Secondary | ICD-10-CM | POA: Diagnosis not present

## 2020-02-21 DIAGNOSIS — M1712 Unilateral primary osteoarthritis, left knee: Secondary | ICD-10-CM | POA: Insufficient documentation

## 2020-02-21 DIAGNOSIS — M5442 Lumbago with sciatica, left side: Secondary | ICD-10-CM | POA: Diagnosis not present

## 2020-02-21 DIAGNOSIS — G8929 Other chronic pain: Secondary | ICD-10-CM | POA: Diagnosis not present

## 2020-02-21 DIAGNOSIS — M5441 Lumbago with sciatica, right side: Secondary | ICD-10-CM | POA: Diagnosis not present

## 2020-02-21 DIAGNOSIS — M25562 Pain in left knee: Secondary | ICD-10-CM | POA: Diagnosis not present

## 2020-02-21 MED ORDER — LIDOCAINE HCL 1 % IJ SOLN
3.0000 mL | INTRAMUSCULAR | Status: AC | PRN
  Administered 2020-02-21: 15:00:00 3 mL

## 2020-02-21 MED ORDER — METHYLPREDNISOLONE ACETATE 40 MG/ML IJ SUSP
40.0000 mg | INTRAMUSCULAR | Status: AC | PRN
  Administered 2020-02-21: 15:00:00 40 mg via INTRA_ARTICULAR

## 2020-02-21 NOTE — Progress Notes (Signed)
Office Visit Note   Patient: [name]           Date of Birth: July 31, 1941           MRN: [id number] Visit Date: 02/21/2020              Requested by: [name], MD [address] [place],  [place] [address] PCP: [name], MD   Assessment & Plan: Visit Diagnoses:  1. Chronic pain of left knee   2. Unilateral primary osteoarthritis, left knee   3. Chronic bilateral low back pain with bilateral sciatica   4. Spinal stenosis of lumbosacral region     Plan: Given the severity of arthritis in her left knee the only nonoperative treatment options would be quad strengthening exercises as well as activity modification and an occasional steroid injection in her left knee.  She is not a candidate for anti-inflammatories since she is on Eliquis.  Also given the degree of her arthritis hyaluronic acid would not help.  She will consider knee replacement surgery at some point but I feel that we should stay conservative so I did recommend a steroid injection in her left knee today and she agreed with this and tolerated it well.  We will see her back in 2 weeks to go over the MRI that she gets on March 18 on the lumbar spine.  Follow-Up Instructions: Return in about 2 weeks (around 03/06/2020).   Orders:  Orders Placed This Encounter  Procedures  . Large Joint Inj   No orders of the defined types were placed in this encounter.     Procedures: Large Joint Inj: L knee on 02/21/2020 3:02 PM Indications: diagnostic evaluation and pain Details: 22 G 1.5 in needle, superolateral approach  Arthrogram: No  Medications: 3 mL lidocaine 1 %; 40 mg methylPREDNISolone acetate 40 MG/ML Outcome: tolerated well, no immediate complications Procedure, treatment alternatives, risks and benefits explained, specific risks discussed. Consent was given by the patient. Immediately prior to procedure a time out was called to verify the correct patient, procedure, equipment, support staff and site/side  marked as required. Patient was prepped and draped in the usual sterile fashion.       Clinical Data: No additional findings.   Subjective: Chief Complaint  Patient presents with  . Left Knee - Follow-up  The patient comes in for follow-up after having a MRI of her left knee.  She is actually supposed to have a MRI of her lumbar spine on March 18.  She does have a history of previous spine surgery.  She is 79 years old and is on Eliquis.  She is not having a lot of left knee pain today but she does have problems ambulating in general.  She does have also a resting tremor.  Her knee pain and back pain are detrimentally affecting her mobility and her quality of life.  HPI  Review of Systems Today she denies any headache, chest pain, shortness of breath, fever, chills, nausea, vomiting  Objective: Vital Signs: There were no vitals taken for this visit.  Physical Exam She is alert and orient x3 and in no acute distress Ortho Exam Examination of her left knee shows a mild effusion.  There is significant medial joint line tenderness and patellofemoral tenderness.  There is some lateral joint line tenderness.  Her knee is ligamentously stable but hurts throughout its arc of motion. Specialty Comments:  No specialty comments available.  Imaging: No results found. The MRI of her  left knee is reviewed.  It does show significant wear of the cartilage in all 3 compartments of the knee.  There are areas of full-thickness cartilage loss.  There is also a complex medial meniscal tear with basically a dysfunctional meniscus.  There is also a large effusion and a significant Baker's cyst.  PMFS History: Patient Active Problem List   Diagnosis Date Noted  . Unilateral primary osteoarthritis, left knee 02/21/2020  . Depression 05/25/2019  . Skin cancer 05/25/2019  . Metatarsalgia of right foot 08/04/2018  . Pulmonary embolus ([place]) 05/19/2018  . Pulmonary embolism ([place]) 05/18/2018  . HTN  (hypertension) 05/18/2018  . Lumbar radiculopathy 11/13/2016  . Hallux rigidus of right foot 05/19/2016  . Onychomycosis of left great toe 05/19/2016  . Gait disturbance 04/05/2016  . Lumbar spinal stenosis 02/20/2016  . Claudication ([place]) 02/03/2016  . Ectropion of both eyes 04/07/2015  . 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  . Vitreous syneresis of both eyes 04/07/2015  . Sleep apnea, obstructive 07/29/2014  . Acute laryngitis 07/24/2014  . Allergic rhinitis 07/24/2014  . Hearing loss 07/24/2014  . Hip osteoarthritis 07/24/2014  . Hoarseness 07/24/2014  . Lumbar degenerative disc disease 07/24/2014  . Lumbar spondylosis 07/24/2014  . 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 Urinary incontinence in female 07/24/2014  . Epiphora due to insufficient drainage, bilateral 05/23/2014  . Essential tremor 04/24/2014  . Conjunctivochalasis 03/09/2012  . Dermatochalasis 03/09/2012  . Hyperopia with astigmatism and presbyopia 03/09/2012  . Pinguecula of both eyes 03/09/2012   Past Medical History:  Diagnosis Date  . 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 Benign familial tremor   . Clostridium difficile infection   . DDD (degenerative disc disease), lumbar   . Diverticulosis   . DVT (deep venous thrombosis) ([facility])    left leg  . Gallstones   . GERD (gastroesophageal reflux disease)   . Hypertension   . Osteoporosis   . Wears glasses     Family History  Problem Relation Age of Onset  . Other Mother        sarcoma  . Other Father        broken hip  . Dementia Father   . Pneumonia Father     Past Surgical History:  Procedure Laterality Date  . ABDOMINAL HYSTERECTOMY  1983  . arterial vascular fistula  2004   coiled at [facility]  [phone]  . CARPAL TUNNEL RELEASE  right  . CHOLECYSTECTOMY N/A 03/29/2013   Procedure: LAPAROSCOPIC CHOLECYSTECTOMY WITH INTRAOPERATIVE CHOLANGIOGRAM;  Surgeon: Haywood Lasso, MD;  Location: [facility];   Service: General;  Laterality: N/A;  . EUS N/A 03/09/2013   Procedure: UPPER ENDOSCOPIC ULTRASOUND (EUS) LINEAR;  Surgeon: Beryle Beams, MD;  Location: [facility];  Service: Endoscopy;  Laterality: N/A;  . LUMBAR LAMINECTOMY/DECOMPRESSION MICRODISCECTOMY  11/30/2012   Procedure: LUMBAR LAMINECTOMY/DECOMPRESSION MICRODISCECTOMY 2 LEVELS;  Surgeon: Magnus Sinning, MD;  Location: [facility];  Service: Orthopedics;  Laterality: N/A;  DECOMPRESSION LAMINECTOMY L4-L5, L5-S1 CENTRAL   . ROTATOR CUFF REPAIR Bilateral 2004   two on right, one on left  . TRIGGER FINGER RELEASE Right     hand   Social History   Occupational History  . Occupation: Futures trader    Comment: partially retired  Tobacco Use  . Smoking status: Former Smoker    Packs/day: 0.50    Types: Cigarettes    Quit date: 07/11/1970    Years since quitting:  49.6  . Smokeless tobacco: Never Used  Substance and Sexual Activity  . Alcohol use: Yes    Alcohol/week: 0.0 standard drinks    Comment: wine occasionally  . Drug use: No  . Sexual activity: Not Currently

## 2020-02-22 DIAGNOSIS — M79605 Pain in left leg: Secondary | ICD-10-CM | POA: Diagnosis not present

## 2020-02-22 DIAGNOSIS — R2689 Other abnormalities of gait and mobility: Secondary | ICD-10-CM | POA: Diagnosis not present

## 2020-02-22 DIAGNOSIS — M4316 Spondylolisthesis, lumbar region: Secondary | ICD-10-CM | POA: Diagnosis not present

## 2020-02-25 DIAGNOSIS — M4316 Spondylolisthesis, lumbar region: Secondary | ICD-10-CM | POA: Diagnosis not present

## 2020-02-25 DIAGNOSIS — R2689 Other abnormalities of gait and mobility: Secondary | ICD-10-CM | POA: Diagnosis not present

## 2020-02-25 DIAGNOSIS — M79605 Pain in left leg: Secondary | ICD-10-CM | POA: Diagnosis not present

## 2020-02-27 DIAGNOSIS — M79605 Pain in left leg: Secondary | ICD-10-CM | POA: Diagnosis not present

## 2020-02-27 DIAGNOSIS — R2689 Other abnormalities of gait and mobility: Secondary | ICD-10-CM | POA: Diagnosis not present

## 2020-02-27 DIAGNOSIS — M4316 Spondylolisthesis, lumbar region: Secondary | ICD-10-CM | POA: Diagnosis not present

## 2020-02-28 ENCOUNTER — Other Ambulatory Visit: Payer: Self-pay

## 2020-02-28 ENCOUNTER — Ambulatory Visit
Admission: RE | Admit: 2020-02-28 | Discharge: 2020-02-28 | Disposition: A | Discharge status: Home / Self Care | Payer: Medicare Other | Source: Ambulatory Visit | Attending: Orthopaedic Surgery | Admitting: Orthopaedic Surgery

## 2020-02-28 DIAGNOSIS — M48061 Spinal stenosis, lumbar region without neurogenic claudication: Secondary | ICD-10-CM | POA: Diagnosis not present

## 2020-02-28 DIAGNOSIS — M4807 Spinal stenosis, lumbosacral region: Secondary | ICD-10-CM

## 2020-02-29 DIAGNOSIS — R2689 Other abnormalities of gait and mobility: Secondary | ICD-10-CM | POA: Diagnosis not present

## 2020-02-29 DIAGNOSIS — M4316 Spondylolisthesis, lumbar region: Secondary | ICD-10-CM | POA: Diagnosis not present

## 2020-02-29 DIAGNOSIS — M79605 Pain in left leg: Secondary | ICD-10-CM | POA: Diagnosis not present

## 2020-03-03 ENCOUNTER — Other Ambulatory Visit: Payer: Self-pay

## 2020-03-03 ENCOUNTER — Encounter: Payer: Self-pay | Admitting: Physician Assistant

## 2020-03-03 ENCOUNTER — Ambulatory Visit (INDEPENDENT_AMBULATORY_CARE_PROVIDER_SITE_OTHER): Payer: Medicare Other | Admitting: Physician Assistant

## 2020-03-03 DIAGNOSIS — M4807 Spinal stenosis, lumbosacral region: Secondary | ICD-10-CM

## 2020-03-03 DIAGNOSIS — M1712 Unilateral primary osteoarthritis, left knee: Secondary | ICD-10-CM

## 2020-03-03 NOTE — Progress Notes (Signed)
Office Visit Note   Patient: [name]           Date of Birth: 06/02/1941           MRN: [id number] Visit Date: 03/03/2020              Requested by: [name], MD [address] [place],  [place] [address] PCP: [name], MD   Assessment & Plan: Visit Diagnoses:  1. Spinal stenosis of lumbosacral region   2. Unilateral primary osteoarthritis, left knee     Plan: Questions were encouraged and answered in regards to knee replacement.  Risk benefits surgery discussed at length.  Knee model was shown.  Recommend she follow-up a month prior to wanting knee surgery once she has had all of her dental surgery completed.  She is on Eliquis and will need to come off of this prior to surgery.  She is hoping to undergo left total knee arthroplasty sometime in mid to late summer.  Follow-Up Instructions: Return in about 2 months (around 05/03/2020).   Orders:  No orders of the defined types were placed in this encounter.  No orders of the defined types were placed in this encounter.     Procedures: No procedures performed   Clinical Data: No additional findings.   Subjective: Chief Complaint  Patient presents with  . Left Knee - Follow-up  . Lower Back - Follow-up    HPI [name] returns today to go over the MRI of her lumbar spine.  She states that she does not feel the pain is coming from her back.  In fact feels it is coming from the knee since having the injection in the left knee on 02/21/2020 her leg feels much better.  MRI of her left knee did show tricompartmental changes with areas of full-thickness cartilage loss.  Also a complex medial meniscal tear. MRI of her lumbar spine that showed lumbar spondylosis and degenerative disc disease with moderate to severe bilateral foraminal stenosis with moderate central narrowing at L4-5.  Moderate bilateral foraminal stenosis at L3-4 with moderate central narrowing.  Mild impingement L1-2,  L2-3 and L5-S1.  Overall  MRI was stable from prior exam on 04/18/2018 with minimal changes at L2-3 with left lateral recess stenosis. Patient's daughters present today.  They both like to discuss possible knee replacement.  She has some dental surgery that needs to be done before knee replacement but has a list of questions about knee replacement. Review of Systems See HPI  Objective: Vital Signs: There were no vitals taken for this visit.  Physical Exam Constitutional:      Appearance: She is not ill-appearing or diaphoretic.  Pulmonary:     Effort: Pulmonary effort is normal.  Neurological:     Mental Status: She is alert and oriented to person, place, and time.     Ortho Exam  Specialty Comments:  No specialty comments available.  Imaging: No results found.   PMFS History: Patient Active Problem List   Diagnosis Date Noted  . Unilateral primary osteoarthritis, left knee 02/21/2020  . Depression 05/25/2019  . Skin cancer 05/25/2019  . Metatarsalgia of right foot 08/04/2018  . Pulmonary embolus ([place]) 05/19/2018  . Pulmonary embolism ([place]) 05/18/2018  . HTN (hypertension) 05/18/2018  . Lumbar radiculopathy 11/13/2016  . Hallux rigidus of right foot 05/19/2016  . Onychomycosis of left great toe 05/19/2016  . Gait disturbance 04/05/2016  . Lumbar spinal stenosis 02/20/2016  . Claudication ([place]) 02/03/2016  . Ectropion  of both eyes 04/07/2015  . 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  . Vitreous syneresis of both eyes 04/07/2015  . Sleep apnea, obstructive 07/29/2014  . Acute laryngitis 07/24/2014  . Allergic rhinitis 07/24/2014  . Hearing loss 07/24/2014  . Hip osteoarthritis 07/24/2014  . Hoarseness 07/24/2014  . Lumbar degenerative disc disease 07/24/2014  . Lumbar spondylosis 07/24/2014  . 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 Urinary incontinence in female 07/24/2014  . Epiphora due to insufficient drainage, bilateral 05/23/2014  . Essential tremor 04/24/2014  . Conjunctivochalasis  03/09/2012  . Dermatochalasis 03/09/2012  . Hyperopia with astigmatism and presbyopia 03/09/2012  . Pinguecula of both eyes 03/09/2012   Past Medical History:  Diagnosis Date  . 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 Benign familial tremor   . Clostridium difficile infection   . DDD (degenerative disc disease), lumbar   . Diverticulosis   . DVT (deep venous thrombosis) (HCC)    left leg  . Gallstones   . GERD (gastroesophageal reflux disease)   . Hypertension   . Osteoporosis   . Wears glasses     Family History  Problem Relation Age of Onset  . Other Mother        sarcoma  . Other Father        broken hip  . Dementia Father   . Pneumonia Father     Past Surgical History:  Procedure Laterality Date  . ABDOMINAL HYSTERECTOMY  1983  . arterial vascular fistula  2004   coiled at [place]  [phone]  . CARPAL TUNNEL RELEASE  right  . CHOLECYSTECTOMY N/A 03/29/2013   Procedure: LAPAROSCOPIC CHOLECYSTECTOMY WITH INTRAOPERATIVE CHOLANGIOGRAM;  Surgeon: Haywood Lasso, MD;  Location: [facility];  Service: General;  Laterality: N/A;  . EUS N/A 03/09/2013   Procedure: UPPER ENDOSCOPIC ULTRASOUND (EUS) LINEAR;  Surgeon: Beryle Beams, MD;  Location: [facility];  Service: Endoscopy;  Laterality: N/A;  . LUMBAR LAMINECTOMY/DECOMPRESSION MICRODISCECTOMY  11/30/2012   Procedure: LUMBAR LAMINECTOMY/DECOMPRESSION MICRODISCECTOMY 2 LEVELS;  Surgeon: Magnus Sinning, MD;  Location: [facility];  Service: Orthopedics;  Laterality: N/A;  DECOMPRESSION LAMINECTOMY L4-L5, L5-S1 CENTRAL   . ROTATOR CUFF REPAIR Bilateral 2004   two on right, one on left  . TRIGGER FINGER RELEASE Right     hand   Social History   Occupational History  . Occupation: Futures trader    Comment: partially retired  Tobacco Use  . Smoking status: Former Smoker    Packs/day: 0.50    Types: Cigarettes    Quit date: 07/11/1970    Years since quitting: 49.6  . Smokeless  tobacco: Never Used  Substance and Sexual Activity  . Alcohol use: Yes    Alcohol/week: 0.0 standard drinks    Comment: wine occasionally  . Drug use: No  . Sexual activity: Not Currently

## 2020-03-04 DIAGNOSIS — M79605 Pain in left leg: Secondary | ICD-10-CM | POA: Diagnosis not present

## 2020-03-04 DIAGNOSIS — R2689 Other abnormalities of gait and mobility: Secondary | ICD-10-CM | POA: Diagnosis not present

## 2020-03-04 DIAGNOSIS — M4316 Spondylolisthesis, lumbar region: Secondary | ICD-10-CM | POA: Diagnosis not present

## 2020-03-05 ENCOUNTER — Ambulatory Visit: Payer: Medicare Other | Admitting: Podiatry

## 2020-03-05 DIAGNOSIS — R2689 Other abnormalities of gait and mobility: Secondary | ICD-10-CM | POA: Diagnosis not present

## 2020-03-05 DIAGNOSIS — M79605 Pain in left leg: Secondary | ICD-10-CM | POA: Diagnosis not present

## 2020-03-05 DIAGNOSIS — M4316 Spondylolisthesis, lumbar region: Secondary | ICD-10-CM | POA: Diagnosis not present

## 2020-03-06 ENCOUNTER — Ambulatory Visit: Payer: Medicare Other | Admitting: Physician Assistant

## 2020-03-07 DIAGNOSIS — M79605 Pain in left leg: Secondary | ICD-10-CM | POA: Diagnosis not present

## 2020-03-07 DIAGNOSIS — M4316 Spondylolisthesis, lumbar region: Secondary | ICD-10-CM | POA: Diagnosis not present

## 2020-03-07 DIAGNOSIS — R2689 Other abnormalities of gait and mobility: Secondary | ICD-10-CM | POA: Diagnosis not present

## 2020-03-10 DIAGNOSIS — M4316 Spondylolisthesis, lumbar region: Secondary | ICD-10-CM | POA: Diagnosis not present

## 2020-03-10 DIAGNOSIS — M79605 Pain in left leg: Secondary | ICD-10-CM | POA: Diagnosis not present

## 2020-03-10 DIAGNOSIS — R2689 Other abnormalities of gait and mobility: Secondary | ICD-10-CM | POA: Diagnosis not present

## 2020-03-11 DIAGNOSIS — R2689 Other abnormalities of gait and mobility: Secondary | ICD-10-CM | POA: Diagnosis not present

## 2020-03-11 DIAGNOSIS — M4316 Spondylolisthesis, lumbar region: Secondary | ICD-10-CM | POA: Diagnosis not present

## 2020-03-11 DIAGNOSIS — M79605 Pain in left leg: Secondary | ICD-10-CM | POA: Diagnosis not present

## 2020-03-13 DIAGNOSIS — M4316 Spondylolisthesis, lumbar region: Secondary | ICD-10-CM | POA: Diagnosis not present

## 2020-03-13 DIAGNOSIS — M79605 Pain in left leg: Secondary | ICD-10-CM | POA: Diagnosis not present

## 2020-03-13 DIAGNOSIS — R2689 Other abnormalities of gait and mobility: Secondary | ICD-10-CM | POA: Diagnosis not present

## 2020-03-17 DIAGNOSIS — M4316 Spondylolisthesis, lumbar region: Secondary | ICD-10-CM | POA: Diagnosis not present

## 2020-03-17 DIAGNOSIS — R2689 Other abnormalities of gait and mobility: Secondary | ICD-10-CM | POA: Diagnosis not present

## 2020-03-17 DIAGNOSIS — M79605 Pain in left leg: Secondary | ICD-10-CM | POA: Diagnosis not present

## 2020-03-20 DIAGNOSIS — M4316 Spondylolisthesis, lumbar region: Secondary | ICD-10-CM | POA: Diagnosis not present

## 2020-03-20 DIAGNOSIS — R2689 Other abnormalities of gait and mobility: Secondary | ICD-10-CM | POA: Diagnosis not present

## 2020-03-20 DIAGNOSIS — M79605 Pain in left leg: Secondary | ICD-10-CM | POA: Diagnosis not present

## 2020-03-24 DIAGNOSIS — R2689 Other abnormalities of gait and mobility: Secondary | ICD-10-CM | POA: Diagnosis not present

## 2020-03-24 DIAGNOSIS — M4316 Spondylolisthesis, lumbar region: Secondary | ICD-10-CM | POA: Diagnosis not present

## 2020-03-24 DIAGNOSIS — M79605 Pain in left leg: Secondary | ICD-10-CM | POA: Diagnosis not present

## 2020-03-27 DIAGNOSIS — M4316 Spondylolisthesis, lumbar region: Secondary | ICD-10-CM | POA: Diagnosis not present

## 2020-03-27 DIAGNOSIS — R2689 Other abnormalities of gait and mobility: Secondary | ICD-10-CM | POA: Diagnosis not present

## 2020-03-27 DIAGNOSIS — M79605 Pain in left leg: Secondary | ICD-10-CM | POA: Diagnosis not present

## 2020-03-28 ENCOUNTER — Other Ambulatory Visit: Payer: Self-pay

## 2020-03-28 ENCOUNTER — Ambulatory Visit (INDEPENDENT_AMBULATORY_CARE_PROVIDER_SITE_OTHER): Payer: Medicare Other | Admitting: Podiatry

## 2020-03-28 DIAGNOSIS — M778 Other enthesopathies, not elsewhere classified: Secondary | ICD-10-CM

## 2020-03-28 DIAGNOSIS — M79675 Pain in left toe(s): Secondary | ICD-10-CM

## 2020-03-28 DIAGNOSIS — B351 Tinea unguium: Secondary | ICD-10-CM

## 2020-03-28 DIAGNOSIS — M79674 Pain in right toe(s): Secondary | ICD-10-CM

## 2020-03-28 DIAGNOSIS — D689 Coagulation defect, unspecified: Secondary | ICD-10-CM | POA: Diagnosis not present

## 2020-03-28 DIAGNOSIS — M19071 Primary osteoarthritis, right ankle and foot: Secondary | ICD-10-CM

## 2020-04-01 ENCOUNTER — Encounter: Payer: Self-pay | Admitting: Podiatry

## 2020-04-01 DIAGNOSIS — R2689 Other abnormalities of gait and mobility: Secondary | ICD-10-CM | POA: Diagnosis not present

## 2020-04-01 DIAGNOSIS — M79605 Pain in left leg: Secondary | ICD-10-CM | POA: Diagnosis not present

## 2020-04-01 DIAGNOSIS — M4316 Spondylolisthesis, lumbar region: Secondary | ICD-10-CM | POA: Diagnosis not present

## 2020-04-01 NOTE — Progress Notes (Signed)
  Subjective:  Patient ID: [name], female    DOB: [birth year],  MRN: [id number]  Chief Complaint  Patient presents with  . Nail Problem    9 Week nail trim   79 y.o. female returns for the above complaint.  Patient presents with a follow-up with thickened elongated mycotic toenails that has been causing a lot of pain.  Patient has not been able to debride them.  She states that she would like to have them debrided is causing her pain when ambulating.  She denies any other acute complaints.  She denies any other pain in her foot and ankle.  Patient also has secondary complaint of right first metatarsophalangeal joint pain.  Patient states that this pain is worsened with ambulation.  This is more of a new onset.  She has not tried anything for this.  She tried massaging it but has not helped.  She denies any other treatment options.  She has not seen anyone else prior to seeing me for this.  Objective:  There were no vitals filed for this visit. Podiatric Exam: Vascular: dorsalis pedis and posterior tibial pulses are palpable bilateral. Capillary return is immediate. Temperature gradient is WNL. Skin turgor WNL  Sensorium: Normal Semmes Weinstein monofilament test. Normal tactile sensation bilaterally. Nail Exam: Pt has thick disfigured discolored nails with subungual debris noted bilateral entire nail hallux through fifth toenails Ulcer Exam: There is no evidence of ulcer or pre-ulcerative changes or infection. Orthopedic Exam: Muscle tone and strength are WNL. No limitations in general ROM.  Mild crepitus noted of the first metatarsophalangeal joint with range of motion active and passive to the right foot.  Intra-articular pain noted to the right first metatarsophalangeal joint.  No extensor or flexor tendinitis noted.  HAV  B/L.  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 Skin: No Porokeratosis. No infection or ulcers  Assessment & Plan:  Patient was evaluated and treated and all questions  answered.  Right first metatarsophalangeal joint arthritis/capsulitis -I explained to the patient the etiology of arthritis and various treatment options were discussed.  Given that he she has a lot of pain associated with the first metatarsophalangeal joint I believe she will benefit from a steroid injection.  Patient agrees with the plan would like to proceed with a steroid injection to help decrease the inflammatory component associated with pain. -A steroid injection was performed at right first metatarsophalangeal joint using 1% plain Lidocaine and 10 mg of Kenalog. This was well tolerated.   Onychomycosis with pain  -Nails palliatively debrided as below. -Educated on self-care  Procedure: Nail Debridement Rationale: pain  Type of Debridement: manual, sharp debridement. Instrumentation: Nail nipper, rotary burr. Number of Nails: 10  Procedures and Treatment: Consent by patient was obtained for treatment procedures. The patient understood the discussion of treatment and procedures well. All questions were answered thoroughly reviewed. Debridement of mycotic and hypertrophic toenails, 1 through 5 bilateral and clearing of subungual debris. No ulceration, no infection noted.  Return Visit-Office Procedure: Patient instructed to return to the office for a follow up visit 3 months for continued evaluation and treatment.  Boneta Lucks, DPM    No follow-ups on file.

## 2020-04-03 DIAGNOSIS — R2689 Other abnormalities of gait and mobility: Secondary | ICD-10-CM | POA: Diagnosis not present

## 2020-04-03 DIAGNOSIS — M79605 Pain in left leg: Secondary | ICD-10-CM | POA: Diagnosis not present

## 2020-04-03 DIAGNOSIS — M4316 Spondylolisthesis, lumbar region: Secondary | ICD-10-CM | POA: Diagnosis not present

## 2020-04-04 DIAGNOSIS — R2689 Other abnormalities of gait and mobility: Secondary | ICD-10-CM | POA: Diagnosis not present

## 2020-04-04 DIAGNOSIS — M79605 Pain in left leg: Secondary | ICD-10-CM | POA: Diagnosis not present

## 2020-04-04 DIAGNOSIS — M4316 Spondylolisthesis, lumbar region: Secondary | ICD-10-CM | POA: Diagnosis not present

## 2020-04-08 ENCOUNTER — Telehealth: Payer: Self-pay | Admitting: Orthopaedic Surgery

## 2020-04-08 NOTE — Telephone Encounter (Signed)
Patient called. 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 Asked what she can do to get one. Her call back number is [phone]

## 2020-04-09 NOTE — Telephone Encounter (Signed)
What do you think? Like [employer]?

## 2020-04-09 NOTE — Telephone Encounter (Signed)
That sounds reasonable.

## 2020-04-09 NOTE — Telephone Encounter (Signed)
Can you do me a favor and make her an appt for [place] to do a "face-to-face" appointment with her before her insurance will ever pay for any sort of electric wheelchair

## 2020-04-10 ENCOUNTER — Ambulatory Visit: Payer: Medicare Other | Admitting: Orthopaedic Surgery

## 2020-04-15 ENCOUNTER — Encounter: Payer: Self-pay | Admitting: Neurology

## 2020-04-15 NOTE — Progress Notes (Signed)
Telephone (Audio) Visit (attempted video x 20 min but pt unable to connect) The purpose of this telephone visit is to provide medical care while limiting exposure to the novel coronavirus.    Consent was obtained for telephone visit and initiated by pt/family:  Yes.   Answered questions that patient had about telehealth interaction:  Yes.   I discussed the limitations, risks, security and privacy concerns of performing an evaluation and management service by telephone. I also discussed with the patient that there may be a patient responsible charge related to this service. The patient expressed understanding and agreed to proceed.  Pt location: Home Physician Location: phone Name of referring provider:  [name], MD I connected with [name] at patients initiation/request on 04/16/2020 at 10:15 AM EDT by telephone and verified that I am speaking with the correct person using two identifiers.  Pt MRN:  [id number] Pt DOB: [birth year]  Assessment/Plan:   1. Essential tremor. -continuePropranolol, 20 mg bid              -Discussed again that she cannot be on the primidone because of Eliquis.  There are lifelong plans for her to stay on the Eliquis per Dr. Alvy Bimler.  -she thinks that tremor is much worse (couldn't see her b/c she couldn't connect to video).  She states that it is influencing her life.  Thinks anxiety is influencing as well.  Discussed topamax.  Biggest risk would be risk of cog change in her age group, so we will start at very low dosage.  Discussed with her that the low dose may not help, but we will see if she tolerates it.  We will start at 25 mg for a week and then just stay at 50 mg.  Suspect that this will not be enough to help tremor, but hopefully we will see if she tolerates this dose.  Discussed extensively risk, benefits, and side effects, including but not limited to weight loss, cognitive dulling, taste aversion, nephrolithiasis.  -Talked to her about the option of weighted gloves/wrist weight to help her with test such as brushing her teeth. 2.. Hx AVM repair with coiling -She does have an upgoing toe on the right, which I think is likely just a result of her history of AVM, which she reports was on the left. I see nothing else focal or lateralizing on her examination today, so decided to hold off on any further neuroimaging.  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 3. Trigeminal neuralgia -no longer having sx 4. recurrent DVT/PE  -follows with Dr. Alvy Bimler, although has not seen her in quite some time (June, 2020)  -on eliquis 5.  Knee pain  -going to have knee replacement.    -told her to ask if can be done under epidural  -She asked me about if she was a candidate for motorized wheelchair.  I told her that decision really needed to be made after she had her knee surgery and see how she ambulates after that.  She agrees.  Need for in person visit now:  No.  Subjective:  Patient seen today in follow-up for essential tremor.  Medical records made available to me have been reviewed. States that tremor has been much worse.  Has to use 2 hands for everything.  The last 3 days have  been better.  She thinks that anxiety has been driving some.  Had major dental work and that has played a role.  The stress of her husband moving the SNF has made things worse.  She has chronic back and knee pain, for which she has been following with orthopedics.  She is undergoing PT and needs knee replacement.  Current Movement d/o meds: Propranolol, 20 mg twice daily   Current Outpatient Medications:  .  amLODipine (NORVASC) 2.5 MG tablet, Take 2.5 mg by mouth daily. , Disp: , Rfl:  .   apixaban (ELIQUIS) 5 MG TABS tablet, Take 10 mg (Two 5 mg Tablets) po BID for 7 days and then Take 5 mg po BID onwards, Disp: 70 tablet, Rfl: 0 .  aspirin EC 81 MG tablet, Take 81 mg by mouth at bedtime., Disp: , Rfl:  .  busPIRone (BUSPAR) 10 MG tablet, Take 10 mg by mouth daily., Disp: , Rfl:  .  donepezil (ARICEPT) 5 MG tablet, Take 5 mg by mouth daily., Disp: , Rfl:  .  Cholecalciferol (VITAMIN D3) 2000 units capsule, Take 1 capsule by mouth at bedtime., Disp: , Rfl:  .  escitalopram (LEXAPRO) 20 MG tablet, Take 20 mg by mouth daily. , Disp: , Rfl:  .  esomeprazole (NEXIUM) 40 MG capsule, Take 40 mg by mouth at bedtime. , Disp: , Rfl:  .  furosemide (LASIX) 20 MG tablet, Take 20 mg by mouth as needed for edema. , Disp: , Rfl:  .  HYDROcodone-acetaminophen (NORCO/VICODIN) 5-325 MG tablet, , Disp: , Rfl:  .  levofloxacin (LEVAQUIN) 500 MG tablet, levofloxacin 500 mg tablet, Disp: , Rfl:  .  loratadine (CLARITIN) 10 MG tablet, Take 10 mg by mouth at bedtime., Disp: , Rfl:  .  mirabegron ER (MYRBETRIQ) 50 MG TB24 tablet, Take 50 mg by mouth daily. , Disp: , Rfl:  .  nitrofurantoin (MACRODANTIN) 100 MG capsule, , Disp: , Rfl:  .  olmesartan (BENICAR) 20 MG tablet, Take 20 mg by mouth daily., Disp: , Rfl:  .  propranolol (INDERAL) 20 MG tablet, TAKE ONE TABLET BY MOUTH TWICE A DAY, Disp: 180 tablet, Rfl: 4 .  saccharomyces boulardii (FLORASTOR) 250 MG capsule, Take 250 mg by mouth 2 (two) times daily., Disp: , Rfl:  .  TOVIAZ 8 MG TB24 tablet, Take 8 mg by mouth daily. , Disp: , Rfl:  .  trimethoprim (TRIMPEX) 100 MG tablet, Take 100 mg by mouth daily. , Disp: , Rfl:     Objective:   Vitals:   04/15/20 1416  Weight: 196 lb (88.9 kg)  Height: 4\' 11"  (1.499 m)   GEN:  The patient appears stated age and is in NAD.  She is alert and oriented x3.  Her speech is fluent and clear.   Follow Up Instructions:      -I discussed the assessment and treatment plan with the patient. The patient was  provided an opportunity to ask questions and all were answered. The patient agreed with the plan and demonstrated an understanding of the instructions.   The patient was advised to call back or seek an in-person evaluation if the symptoms worsen or if the condition fails to improve as anticipated.   Total time spent on today's visit was 25 minutes, 100% in counseling   Wells Guiles Asley [place], DO

## 2020-04-16 ENCOUNTER — Other Ambulatory Visit: Payer: Self-pay

## 2020-04-16 ENCOUNTER — Telehealth (INDEPENDENT_AMBULATORY_CARE_PROVIDER_SITE_OTHER): Payer: Medicare Other | Admitting: Neurology

## 2020-04-16 VITALS — Ht 59.0 in | Wt 196.0 lb

## 2020-04-16 DIAGNOSIS — G250 Essential tremor: Secondary | ICD-10-CM

## 2020-04-16 MED ORDER — TOPIRAMATE 50 MG PO TABS: ORAL_TABLET | ORAL | 1 refills | Status: DC

## 2020-04-22 ENCOUNTER — Encounter: Payer: Self-pay | Admitting: Orthopaedic Surgery

## 2020-04-22 ENCOUNTER — Other Ambulatory Visit: Payer: Self-pay

## 2020-04-22 ENCOUNTER — Ambulatory Visit (INDEPENDENT_AMBULATORY_CARE_PROVIDER_SITE_OTHER): Payer: Medicare Other | Admitting: Orthopaedic Surgery

## 2020-04-22 VITALS — Wt 196.0 lb

## 2020-04-22 DIAGNOSIS — M1712 Unilateral primary osteoarthritis, left knee: Secondary | ICD-10-CM | POA: Diagnosis not present

## 2020-04-22 NOTE — Progress Notes (Signed)
Office Visit Note   Patient: [name]           Date of Birth: March 19, 1941           MRN: [id number] Visit Date: 04/22/2020              Requested by: [name], MD [address] [place],  [place] [address] PCP: [name], MD   Assessment & Plan: Visit Diagnoses:  1. Unilateral primary osteoarthritis, left knee     Plan: I did show her a knee replacement model today and described in detail what knee replacement surgery involves.  We talked about her interoperative and postoperative course.  I described the risk and benefits of surgery as well.  She understands that she will need to stop Eliquis 4 days before surgery.  She will also work on keeping her weight down while we wait discussed with the surgery sometime next month.  All questions and concerns were answered and addressed.  Follow-Up Instructions: Return for 2 weeks post-op.   Orders:  No orders of the defined types were placed in this encounter.  No orders of the defined types were placed in this encounter.     Procedures: No procedures performed   Clinical Data: No additional findings.   Subjective: Chief Complaint  Patient presents with  . Right Knee - Pain  The patient is well-known to me.  She is 79 year old we have been following some time now for her left knee.  It is listed as right knee pain but it is actually her right knee that has just mild pain but her left knee we have been following for some time now.  She has known end-stage arthritis of the left knee that also correlated with her MRI.  She is someone that we have had to hold off on surgery due to her weight.  Her BMI is now below 40 at 39.5 and has been consistently staying there.  Her mobility has been significant limited due to her left knee arthritis.  Her actives daily living and quality of life are detrimentally affected as well.  We have tried all forms conservative treatment including therapy and injections.  At this point she does  wish to proceed with knee replacement surgery.  We have gone over this with her before and her daughter.  We will described this again today.  She is on Eliquis and that is a blood thinning medication we would like her to stop at least 4 days before surgery.  She understands this as well.  Her primary care physician is Dr. [name].  HPI  Review of Systems She currently denies any headache, chest pain, shortness of breath, fever, chills, nausea, vomiting  Objective: Vital Signs: Wt 196 lb (88.9 kg)   BMI 39.59 kg/m   Physical Exam She is alert and orient x3 and in no acute distress Ortho Exam Examination of her left knee actually shows great range of motion.  The knee is ligamentously stable.  There is medial and lateral joint line tenderness.  There is no effusion today. Specialty Comments:  No specialty comments available.  Imaging: No results found.   PMFS History: Patient Active Problem List   Diagnosis Date Noted  . Unilateral primary osteoarthritis, left knee 02/21/2020  . Depression 05/25/2019  . Skin cancer 05/25/2019  . Metatarsalgia of right foot 08/04/2018  . Pulmonary embolus ([place]) 05/19/2018  . Pulmonary embolism ([place]) 05/18/2018  . HTN (hypertension) 05/18/2018  . Lumbar radiculopathy 11/13/2016  .  Hallux rigidus of right foot 05/19/2016  . Onychomycosis of left great toe 05/19/2016  . Gait disturbance 04/05/2016  . Lumbar spinal stenosis 02/20/2016  . Claudication ([place]) 02/03/2016  . Ectropion of both eyes 04/07/2015  . 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  . Vitreous syneresis of both eyes 04/07/2015  . Sleep apnea, obstructive 07/29/2014  . Acute laryngitis 07/24/2014  . Allergic rhinitis 07/24/2014  . Hearing loss 07/24/2014  . Hip osteoarthritis 07/24/2014  . Hoarseness 07/24/2014  . Lumbar degenerative disc disease 07/24/2014  . Lumbar spondylosis 07/24/2014  . 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 Urinary incontinence in female 07/24/2014  .  Epiphora due to insufficient drainage, bilateral 05/23/2014  . Essential tremor 04/24/2014  . Conjunctivochalasis 03/09/2012  . Dermatochalasis 03/09/2012  . Hyperopia with astigmatism and presbyopia 03/09/2012  . Pinguecula of both eyes 03/09/2012   Past Medical History:  Diagnosis Date  . 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 Benign familial tremor   . Clostridium difficile infection   . DDD (degenerative disc disease), lumbar   . Diverticulosis   . DVT (deep venous thrombosis) ([facility])    left leg  . Gallstones   . GERD (gastroesophageal reflux disease)   . Hypertension   . Osteoporosis   . Wears glasses     Family History  Problem Relation Age of Onset  . Other Mother        sarcoma  . Other Father        broken hip  . Dementia Father   . Pneumonia Father     Past Surgical History:  Procedure Laterality Date  . ABDOMINAL HYSTERECTOMY  1983  . arterial vascular fistula  2004   coiled at [facility]  [phone]  . CARPAL TUNNEL RELEASE  right  . CHOLECYSTECTOMY N/A 03/29/2013   Procedure: LAPAROSCOPIC CHOLECYSTECTOMY WITH INTRAOPERATIVE CHOLANGIOGRAM;  Surgeon: Haywood Lasso, MD;  Location: [facility];  Service: General;  Laterality: N/A;  . EUS N/A 03/09/2013   Procedure: UPPER ENDOSCOPIC ULTRASOUND (EUS) LINEAR;  Surgeon: Beryle Beams, MD;  Location: [facility];  Service: Endoscopy;  Laterality: N/A;  . LUMBAR LAMINECTOMY/DECOMPRESSION MICRODISCECTOMY  11/30/2012   Procedure: LUMBAR LAMINECTOMY/DECOMPRESSION MICRODISCECTOMY 2 LEVELS;  Surgeon: Magnus Sinning, MD;  Location: [facility];  Service: Orthopedics;  Laterality: N/A;  DECOMPRESSION LAMINECTOMY L4-L5, L5-S1 CENTRAL   . ROTATOR CUFF REPAIR Bilateral 2004   two on right, one on left  . TRIGGER FINGER RELEASE Right     hand   Social History   Occupational History  . Occupation: Futures trader    Comment: partially retired  Tobacco Use  . Smoking status: Former  Smoker    Packs/day: 0.50    Types: Cigarettes    Quit date: 07/11/1970    Years since quitting: 49.8  . Smokeless tobacco: Never Used  Substance and Sexual Activity  . Alcohol use: Yes    Alcohol/week: 0.0 standard drinks    Comment: wine occasionally  . Drug use: No  . Sexual activity: Not Currently

## 2020-05-09 ENCOUNTER — Other Ambulatory Visit: Payer: Self-pay

## 2020-05-13 DIAGNOSIS — R1312 Dysphagia, oropharyngeal phase: Secondary | ICD-10-CM | POA: Diagnosis not present

## 2020-05-13 DIAGNOSIS — I10 Essential (primary) hypertension: Secondary | ICD-10-CM | POA: Diagnosis not present

## 2020-05-13 DIAGNOSIS — M810 Age-related osteoporosis without current pathological fracture: Secondary | ICD-10-CM | POA: Diagnosis not present

## 2020-05-27 NOTE — Progress Notes (Addendum)
PCP - [name], MD  Cardiologist - N/A  No stimulator in back  PPM/ICD -  Device Orders -  Rep Notified -   Chest x-ray -  EKG -  Stress Test -  ECHO - 05-19-18 Cardiac Cath -   Sleep Study -  CPAP -   Fasting Blood Sugar -  Checks Blood Sugar _____ times a day  Blood Thinner Instructions: Eliquis-Pt to hold 06-02-20 Aspirin Instructions:  ERAS Protcol - PRE-SURGERY Ensure or G2-   COVID TEST-  COVID Vaccination x 2 (Pf[employer])  Physical activity-Able to clean home w/o SOB  Anesthesia review:   Patient denies shortness of breath, fever, cough and chest pain at PAT appointment   All instructions explained to the patient, with a verbal understanding of the material. Patient agrees to go over the instructions while at home for a better understanding. Patient also instructed to self quarantine after being tested for COVID-19. The opportunity to ask questions was provided.

## 2020-05-27 NOTE — Progress Notes (Signed)
Please place surgery orders. Pt scheduled for PAT appt on 05-29-20. Thank you.

## 2020-05-27 NOTE — Patient Instructions (Addendum)
DUE TO COVID-19 ONLY ONE VISITOR IS ALLOWED TO COME WITH YOU AND STAY IN THE WAITING ROOM ONLY DURING PRE OP AND PROCEDURE DAY OF SURGERY. THE 1 VISITOR MAY VISIT WITH YOU AFTER SURGERY IN YOUR PRIVATE ROOM DURING VISITING HOURS ONLY!  YOU NEED TO HAVE A COVID 19 TEST ON 06-03-20 @ 11:50 AM, THIS TEST MUST BE DONE BEFORE SURGERY, COME  [place], [place] , [address].  ([place]) ONCE YOUR COVID TEST IS COMPLETED, PLEASE BEGIN THE QUARANTINE INSTRUCTIONS AS OUTLINED IN YOUR HANDOUT.                [name]  05/27/2020   Your procedure is scheduled on: 06-06-20   Report to [facility] Main  Entrance    Report to Admitting at 9:45 AM     Call this number if you have problems the morning of surgery [phone]    Remember: AFTER MIDNIGHT THE NIGHT PRIOR TO SURGERY. NOTHING BY MOUTH EXCEPT CLEAR LIQUIDS UNTIL 9:15 AM . PLEASE FINISH ENSURE DRINK PER SURGEON ORDER  WHICH NEEDS TO BE COMPLETED AT 9:15 AM .   CLEAR LIQUID DIET   Foods Allowed                                                                     Foods Excluded  Coffee and tea, regular and decaf                             liquids that you cannot  Plain Jell-O any favor except red or purple                                           see through such as: Fruit ices (not with fruit pulp)                                     milk, soups, orange juice  Iced Popsicles                                    All solid food Carbonated beverages, regular and diet                                    Cranberry, grape and apple juices Sports drinks like Gatorade Lightly seasoned clear broth or consume(fat free) Sugar, honey syrup   _____________________________________________________________________     Take these medicines the morning of surgery with A SIP OF WATER: Amlodipine (Norvasc), Buspirone (Buspar), Lexapro (Escitalopram), Nexium, Propanolol (Inderal)   BRUSH YOUR TEETH MORNING OF  SURGERY AND RINSE YOUR MOUTH OUT, NO CHEWING GUM CANDY OR MINTS.                              You may not have any metal on your  body including hair pins and              piercings     Do not wear jewelry, make-up, lotions, powders or perfumes, deodorant              Do not wear nail polish on your fingernails.  Do not shave  48 hours prior to surgery.     Do not bring valuables to the hospital. [place].  Contacts, dentures or bridgework may not be worn into surgery.  You may bring small overnight bag    Special Instructions: N/A              Please read over the following fact sheets you were given: _____________________________________________________________________             [facility] - Preparing for Surgery Before surgery, you can play an important role.  Because skin is not sterile, your skin needs to be as free of germs as possible.  You can reduce the number of germs on your skin by washing with CHG (chlorahexidine gluconate) soap before surgery.  CHG is an antiseptic cleaner which kills germs and bonds with the skin to continue killing germs even after washing. Please DO NOT use if you have an allergy to CHG or antibacterial soaps.  If your skin becomes reddened/irritated stop using the CHG and inform your nurse when you arrive at Short Stay. Do not shave (including legs and underarms) for at least 48 hours prior to the first CHG shower.  You may shave your face/neck. Please follow these instructions carefully:  1.  Shower with CHG Soap the night before surgery and the  morning of Surgery.  2.  If you choose to wash your hair, wash your hair first as usual with your  normal  shampoo.  3.  After you shampoo, rinse your hair and body thoroughly to remove the  shampoo.                           4.  Use CHG as you would any other liquid soap.  You can apply chg directly  to the skin and wash                       Gently with  a scrungie or clean washcloth.  5.  Apply the CHG Soap to your body ONLY FROM THE NECK DOWN.   Do not use on face/ open                           Wound or open sores. Avoid contact with eyes, ears mouth and genitals (private parts).                       Wash face,  Genitals (private parts) with your normal soap.             6.  Wash thoroughly, paying special attention to the area where your surgery  will be performed.  7.  Thoroughly rinse your body with warm water from the neck down.  8.  DO NOT shower/wash with your normal soap after using and rinsing off  the CHG Soap.                9.  Pat yourself dry with a clean towel.            10.  Wear clean pajamas.            11.  Place clean sheets on your bed the night of your first shower and do not  sleep with pets. Day of Surgery : Do not apply any lotions/deodorants the morning of surgery.  Please wear clean clothes to the hospital/surgery center.  FAILURE TO FOLLOW THESE INSTRUCTIONS MAY RESULT IN THE CANCELLATION OF YOUR SURGERY PATIENT SIGNATURE_________________________________  NURSE SIGNATURE__________________________________  ________________________________________________________________________   [name]  An incentive spirometer is a tool that can help keep your lungs clear and active. This tool measures how well you are filling your lungs with each breath. Taking long deep breaths may help reverse or decrease the chance of developing breathing (pulmonary) problems (especially infection) following:  A long period of time when you are unable to move or be active. BEFORE THE PROCEDURE   If the spirometer includes an indicator to show your best effort, your nurse or respiratory therapist will set it to a desired goal.  If possible, sit up straight or lean slightly forward. Try not to slouch.  Hold the incentive spirometer in an upright position. INSTRUCTIONS FOR USE  1. Sit on the edge of your bed if  possible, or sit up as far as you can in bed or on a chair. 2. Hold the incentive spirometer in an upright position. 3. Breathe out normally. 4. Place the mouthpiece in your mouth and seal your lips tightly around it. 5. Breathe in slowly and as deeply as possible, raising the piston or the ball toward the top of the column. 6. Hold your breath for 3-5 seconds or for as long as possible. Allow the piston or ball to fall to the bottom of the column. 7. Remove the mouthpiece from your mouth and breathe out normally. 8. Rest for a few seconds and repeat Steps 1 through 7 at least 10 times every 1-2 hours when you are awake. Take your time and take a few normal breaths between deep breaths. 9. The spirometer may include an indicator to show your best effort. Use the indicator as a goal to work toward during each repetition. 10. After each set of 10 deep breaths, practice coughing to be sure your lungs are clear. If you have an incision (the cut made at the time of surgery), support your incision when coughing by placing a pillow or rolled up towels firmly against it. Once you are able to get out of bed, walk around indoors and cough well. You may stop using the incentive spirometer when instructed by your caregiver.  RISKS AND COMPLICATIONS  Take your time so you do not get dizzy or light-headed.  If you are in pain, you may need to take or ask for pain medication before doing incentive spirometry. It is harder to take a deep breath if you are having pain. AFTER USE  Rest and breathe slowly and easily.  It can be helpful to keep track of a log of your progress. Your caregiver can provide you with a simple table to help with this. If you are using the spirometer at home, follow these instructions: [place] IF:   You are having difficultly using the spirometer.  You have trouble using the spirometer as often as instructed.  Your pain medication is not giving enough relief while using  the spirometer.  You develop fever  of 100.5 F (38.1 C) or higher. SEEK IMMEDIATE MEDICAL CARE IF:   You cough up bloody sputum that had not been present before.  You develop fever of 102 F (38.9 C) or greater.  You develop worsening pain at or near the incision site. MAKE SURE YOU:   Understand these instructions.  Will watch your condition.  Will get help right away if you are not doing well or get worse. Document Released: 04/11/2007 Document Revised: 02/21/2012 Document Reviewed: 06/12/2007 [facility] Patient Information 2014 [place], [place].   ________________________________________________________________________

## 2020-05-28 ENCOUNTER — Other Ambulatory Visit: Payer: Self-pay | Admitting: Family

## 2020-05-29 ENCOUNTER — Encounter (HOSPITAL_COMMUNITY): Payer: Self-pay

## 2020-05-29 ENCOUNTER — Other Ambulatory Visit: Payer: Self-pay

## 2020-05-29 ENCOUNTER — Encounter (HOSPITAL_COMMUNITY)
Admission: RE | Admit: 2020-05-29 | Discharge: 2020-05-29 | Disposition: A | Discharge status: Home / Self Care | Payer: Medicare Other | Source: Ambulatory Visit | Attending: Orthopaedic Surgery | Admitting: Orthopaedic Surgery

## 2020-05-29 DIAGNOSIS — Z01818 Encounter for other preprocedural examination: Secondary | ICD-10-CM | POA: Insufficient documentation

## 2020-05-29 LAB — SURGICAL PCR SCREEN
MRSA, PCR: NEGATIVE
Staphylococcus aureus: NEGATIVE

## 2020-05-29 LAB — BASIC METABOLIC PANEL
Anion gap: 11 (ref 5–15)
BUN: 19 mg/dL (ref 8–23)
CO2: 23 mmol/L (ref 22–32)
Calcium: 8.8 mg/dL — ABNORMAL LOW (ref 8.9–10.3)
Chloride: 105 mmol/L (ref 98–111)
Creatinine, Ser: 0.65 mg/dL (ref 0.44–1.00)
GFR calc Af Amer: >60 mL/min (ref >60)
GFR calc non Af Amer: >60 mL/min (ref >60)
Glucose, Bld: 92 mg/dL (ref 70–99)
Potassium: 4.9 mmol/L (ref 3.5–5.1)
Sodium: 139 mmol/L (ref 135–145)

## 2020-05-29 LAB — CBC
HCT: 39.2 % (ref 36.0–46.0)
Hemoglobin: 13.3 g/dL (ref 12.0–15.0)
MCH: 32.9 pg (ref 26.0–34.0)
MCHC: 33.9 g/dL (ref 30.0–36.0)
MCV: 97 fL (ref 80.0–100.0)
Platelets: 213 10*3/uL (ref 150–400)
RBC: 4.04 MIL/uL (ref 3.87–5.11)
RDW: 13.8 % (ref 11.5–15.5)
WBC: 7.9 10*3/uL (ref 4.0–10.5)
nRBC: 0 % (ref 0.0–0.2)

## 2020-06-03 ENCOUNTER — Other Ambulatory Visit (HOSPITAL_COMMUNITY)
Admission: RE | Admit: 2020-06-03 | Discharge: 2020-06-03 | Disposition: A | Discharge status: Home / Self Care | Payer: Medicare Other | Source: Ambulatory Visit | Attending: Orthopaedic Surgery | Admitting: Orthopaedic Surgery

## 2020-06-03 DIAGNOSIS — Z01812 Encounter for preprocedural laboratory examination: Secondary | ICD-10-CM | POA: Diagnosis present

## 2020-06-03 DIAGNOSIS — Z20822 Contact with and (suspected) exposure to covid-19: Secondary | ICD-10-CM | POA: Insufficient documentation

## 2020-06-03 LAB — SARS CORONAVIRUS 2 (TAT 6-24 HRS): SARS Coronavirus 2: NEGATIVE

## 2020-06-04 ENCOUNTER — Telehealth: Payer: Self-pay | Admitting: *Deleted

## 2020-06-04 ENCOUNTER — Other Ambulatory Visit: Payer: Self-pay | Admitting: *Deleted

## 2020-06-04 DIAGNOSIS — M1712 Unilateral primary osteoarthritis, left knee: Secondary | ICD-10-CM

## 2020-06-04 NOTE — Care Plan (Signed)
CM spoke to patient regarding her upcoming surgery with Dr. Ninfa Linden on 06/06/20. She is an Ortho bundle patient through [facility]. She is agreeable to CM. Patient lives at [facility] independent living and will be returning after a short hospital stay. She will have assistance from workers there at the facility as well as therapy from the Outpatient Rehab department there as well. Orders have been provided already to the [facility] staff regarding rehab for PT and possibly OT. Patient has all DME needed post-op. CM will continue to follow along for any needs.

## 2020-06-04 NOTE — Telephone Encounter (Signed)
Ortho bundle pre-op call. 

## 2020-06-05 ENCOUNTER — Telehealth: Payer: Self-pay | Admitting: *Deleted

## 2020-06-05 NOTE — Telephone Encounter (Signed)
Ortho bundle pre-op call completed. 

## 2020-06-05 NOTE — H&P (Signed)
TOTAL KNEE ADMISSION H&P  Patient is being admitted for left total knee arthroplasty.  Subjective:  Chief Complaint:left knee pain.  HPI: [name], 79 y.o. female, has a history of pain and functional disability in the left knee due to arthritis and has failed non-surgical conservative treatments for greater than 12 weeks to includeNSAID's and/or analgesics, corticosteriod injections, viscosupplementation injections, flexibility and strengthening excercises, use of assistive devices, weight reduction as appropriate and activity modification.  Onset of symptoms was gradual, starting 5 years ago with gradually worsening course since that time. The patient noted no past surgery on the left knee(s).  Patient currently rates pain in the left knee(s) at 10 out of 10 with activity. Patient has .  Patient has evidence of subchondral sclerosis, periarticular osteophytes and joint space narrowing by imaging studies. There is no active infection.  Patient Active Problem List   Diagnosis Date Noted   Unilateral primary osteoarthritis, left knee 02/21/2020   Depression 05/25/2019   Skin cancer 05/25/2019   Metatarsalgia of right foot 08/04/2018   Pulmonary embolus ([place]) 05/19/2018   Pulmonary embolism ([place]) 05/18/2018   HTN (hypertension) 05/18/2018   Lumbar radiculopathy 11/13/2016   Hallux rigidus of right foot 05/19/2016   Onychomycosis of left great toe 05/19/2016   Gait disturbance 04/05/2016   Lumbar spinal stenosis 02/20/2016   Claudication ([place]) 02/03/2016   Ectropion of both eyes 04/07/2015   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   Vitreous syneresis of both eyes 04/07/2015   Sleep apnea, obstructive 07/29/2014   Acute laryngitis 07/24/2014   Allergic rhinitis 07/24/2014   Hearing loss 07/24/2014   Hip osteoarthritis 07/24/2014   Hoarseness 07/24/2014   Lumbar degenerative disc disease 07/24/2014   Lumbar spondylosis 07/24/2014   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   Urinary incontinence in female 07/24/2014   Epiphora due to insufficient drainage, bilateral 05/23/2014   Essential tremor 04/24/2014   Conjunctivochalasis 03/09/2012   Dermatochalasis 03/09/2012   Hyperopia with astigmatism and presbyopia 03/09/2012   Pinguecula of both eyes 03/09/2012   Past Medical History:  Diagnosis Date   Arthritis    arthritis in joints and back   Benign familial tremor    Clostridium difficile infection    DDD (degenerative disc disease), lumbar    Diverticulosis    DVT (deep venous thrombosis) (HCC)    left leg   Gallstones    GERD (gastroesophageal reflux disease)    Hypertension    Osteoporosis    Wears glasses     Past Surgical History:  Procedure Laterality Date   ABDOMINAL HYSTERECTOMY  1983   arterial vascular fistula  2004   coiled at [place]  right   CHOLECYSTECTOMY N/A 03/29/2013   Procedure: LAPAROSCOPIC CHOLECYSTECTOMY WITH INTRAOPERATIVE CHOLANGIOGRAM;  Surgeon: Haywood Lasso, MD;  Location: [facility];  Service: General;  Laterality: N/A;   EUS N/A 03/09/2013   Procedure: UPPER ENDOSCOPIC ULTRASOUND (EUS) LINEAR;  Surgeon: Beryle Beams, MD;  Location: [facility];  Service: Endoscopy;  Laterality: N/A;   LUMBAR LAMINECTOMY/DECOMPRESSION MICRODISCECTOMY  11/30/2012   Procedure: LUMBAR LAMINECTOMY/DECOMPRESSION MICRODISCECTOMY 2 LEVELS;  Surgeon: Magnus Sinning, MD;  Location: [facility];  Service: Orthopedics;  Laterality: N/A;  DECOMPRESSION LAMINECTOMY L4-L5, L5-S1 CENTRAL    ROTATOR CUFF REPAIR Bilateral 2004   two on right, one on left   TRIGGER FINGER RELEASE Right     hand    No current facility-administered medications for  this encounter.   Current Outpatient Medications  Medication Sig Dispense Refill Last Dose   acetaminophen (TYLENOL) 500 MG tablet Take 500 mg by mouth every 6 (six) hours as needed (for pain.).        amLODipine (NORVASC) 2.5 MG tablet Take 2.5 mg by mouth daily.       apixaban (ELIQUIS) 5 MG TABS tablet Take 10 mg (Two 5 mg Tablets) po BID for 7 days and then Take 5 mg po BID onwards 70 tablet 0    busPIRone (BUSPAR) 10 MG tablet Take 10 mg by mouth 2 (two) times daily.       cholecalciferol (VITAMIN D) 25 MCG (1000 UNIT) tablet Take 1,000 Units by mouth daily.      donepezil (ARICEPT) 10 MG tablet Take 10 mg by mouth at bedtime.      escitalopram (LEXAPRO) 20 MG tablet Take 20 mg by mouth daily.       esomeprazole (NEXIUM) 40 MG capsule Take 40 mg by mouth 2 (two) times daily before a meal.       loratadine (CLARITIN) 10 MG tablet Take 10 mg by mouth at bedtime.      mirabegron ER (MYRBETRIQ) 50 MG TB24 tablet Take 50 mg by mouth at bedtime.       nitrofurantoin (MACRODANTIN) 100 MG capsule Take 100 mg by mouth in the morning.       olmesartan (BENICAR) 20 MG tablet Take 20 mg by mouth in the morning and at bedtime.       propranolol (INDERAL) 20 MG tablet TAKE ONE TABLET BY MOUTH TWICE A DAY (Patient taking differently: Take 20 mg by mouth in the morning and at bedtime. ) 180 tablet 4    saccharomyces boulardii (FLORASTOR) 250 MG capsule Take 250 mg by mouth 2 (two) times daily.      topiramate (TOPAMAX) 50 MG tablet 1/2 tab for 1 week, then 1 tab daily (Patient taking differently: Take 50 mg by mouth at bedtime. ) 90 tablet 1    TOVIAZ 8 MG TB24 tablet Take 8 mg by mouth at bedtime.       vitamin B-12 (CYANOCOBALAMIN) 1000 MCG tablet Take 1,000 mcg by mouth daily.      Allergies  Allergen Reactions   Buprenorphine Hcl Nausea And Vomiting   Codeine Nausea And Vomiting    Other reaction(s): Other (See Comments) But tolerates Tramadol But tolerates Tramadol Vomiting    Morphine And Related Nausea And Vomiting   Morphine     Other reaction(s): Other (See Comments) Vomiting   Procaine     Other reaction(s): Other (See Comments) Other reaction(s):  Shakes Novacaine-Shakes    Social History   Tobacco Use   Smoking status: Former Smoker    Packs/day: 0.50    Types: Cigarettes    Quit date: 07/11/1970    Years since quitting: 49.9   Smokeless tobacco: Never Used  Substance Use Topics   Alcohol use: Yes    Alcohol/week: 0.0 standard drinks    Comment: wine occasionally    Family History  Problem Relation Age of Onset   Other Mother        sarcoma   Other Father        broken hip   Dementia Father    Pneumonia Father      Review of Systems  Musculoskeletal: Positive for joint swelling.  All other systems reviewed and are negative.   Objective:  Physical Exam  Vitals reviewed. Constitutional: She is  oriented to person, place, and time.  HENT:  Head: Normocephalic and atraumatic.  Eyes: Pupils are equal, round, and reactive to light.  Cardiovascular: Normal rate and normal pulses.  Respiratory: Effort normal.  GI: Soft. Normal appearance.  Musculoskeletal:     Cervical back: Normal range of motion.     Left knee: Swelling, effusion and bony tenderness present. Decreased range of motion. Tenderness present over the medial joint line and patellar tendon. Abnormal alignment.  Neurological: She is alert and oriented to person, place, and time.  Psychiatric: Her behavior is normal.    Vital signs in last 24 hours:    Labs:   Estimated body mass index is 38.98 kg/m as calculated from the following:   Height as of 05/29/20: 4\' 11"  (1.499 m).   Weight as of 05/29/20: 87.5 kg.   Imaging Review Plain radiographs demonstrate severe degenerative joint disease of the left knee(s). The overall alignment ismild varus. The bone quality appears to be good for age and reported activity level.      Assessment/Plan:  End stage arthritis, left knee   The patient history, physical examination, clinical judgment of the provider and imaging studies are consistent with end stage degenerative joint disease of the  left knee(s) and total knee arthroplasty is deemed medically necessary. The treatment options including medical management, injection therapy arthroscopy and arthroplasty were discussed at length. The risks and benefits of total knee arthroplasty were presented and reviewed. The risks due to aseptic loosening, infection, stiffness, patella tracking problems, thromboembolic complications and other imponderables were discussed. The patient acknowledged the explanation, agreed to proceed with the plan and consent was signed. Patient is being admitted for inpatient treatment for surgery, pain control, PT, OT, prophylactic antibiotics, VTE prophylaxis, progressive ambulation and ADL's and discharge planning. The patient is planning to be discharged home with home health services

## 2020-06-06 ENCOUNTER — Encounter (HOSPITAL_COMMUNITY): Payer: Self-pay | Admitting: Orthopaedic Surgery

## 2020-06-06 ENCOUNTER — Other Ambulatory Visit: Payer: Self-pay

## 2020-06-06 ENCOUNTER — Encounter (HOSPITAL_COMMUNITY)
Admission: RE | Disposition: A | Discharge status: Skilled Nursing Facility | Payer: Self-pay | Source: Ambulatory Visit | Attending: Orthopaedic Surgery

## 2020-06-06 ENCOUNTER — Ambulatory Visit (HOSPITAL_COMMUNITY): Payer: Medicare Other | Admitting: Certified Registered Nurse Anesthetist

## 2020-06-06 ENCOUNTER — Inpatient Hospital Stay (HOSPITAL_COMMUNITY)
Admission: RE | Admit: 2020-06-06 | Discharge: 2020-06-10 | DRG: 470 | Disposition: A | Discharge status: Skilled Nursing Facility | Payer: Medicare Other | Source: Ambulatory Visit | Attending: Orthopaedic Surgery | Admitting: Orthopaedic Surgery

## 2020-06-06 ENCOUNTER — Ambulatory Visit (HOSPITAL_COMMUNITY): Payer: Medicare Other | Admitting: Physician Assistant

## 2020-06-06 ENCOUNTER — Observation Stay (HOSPITAL_COMMUNITY): Payer: Medicare Other

## 2020-06-06 DIAGNOSIS — Z20822 Contact with and (suspected) exposure to covid-19: Secondary | ICD-10-CM | POA: Diagnosis present

## 2020-06-06 DIAGNOSIS — I10 Essential (primary) hypertension: Secondary | ICD-10-CM | POA: Diagnosis present

## 2020-06-06 DIAGNOSIS — Z7901 Long term (current) use of anticoagulants: Secondary | ICD-10-CM

## 2020-06-06 DIAGNOSIS — Z96652 Presence of left artificial knee joint: Secondary | ICD-10-CM

## 2020-06-06 DIAGNOSIS — Z79899 Other long term (current) drug therapy: Secondary | ICD-10-CM

## 2020-06-06 DIAGNOSIS — M1712 Unilateral primary osteoarthritis, left knee: Secondary | ICD-10-CM | POA: Diagnosis not present

## 2020-06-06 DIAGNOSIS — K219 Gastro-esophageal reflux disease without esophagitis: Secondary | ICD-10-CM | POA: Diagnosis present

## 2020-06-06 DIAGNOSIS — M810 Age-related osteoporosis without current pathological fracture: Secondary | ICD-10-CM | POA: Diagnosis present

## 2020-06-06 DIAGNOSIS — G4733 Obstructive sleep apnea (adult) (pediatric): Secondary | ICD-10-CM | POA: Diagnosis present

## 2020-06-06 DIAGNOSIS — Z86711 Personal history of pulmonary embolism: Secondary | ICD-10-CM

## 2020-06-06 DIAGNOSIS — Z85828 Personal history of other malignant neoplasm of skin: Secondary | ICD-10-CM

## 2020-06-06 DIAGNOSIS — Z87891 Personal history of nicotine dependence: Secondary | ICD-10-CM

## 2020-06-06 DIAGNOSIS — H9190 Unspecified hearing loss, unspecified ear: Secondary | ICD-10-CM | POA: Diagnosis present

## 2020-06-06 DIAGNOSIS — Z96641 Presence of right artificial hip joint: Secondary | ICD-10-CM

## 2020-06-06 DIAGNOSIS — Z884 Allergy status to anesthetic agent status: Secondary | ICD-10-CM

## 2020-06-06 DIAGNOSIS — G250 Essential tremor: Secondary | ICD-10-CM | POA: Diagnosis present

## 2020-06-06 DIAGNOSIS — Z888 Allergy status to other drugs, medicaments and biological substances status: Secondary | ICD-10-CM

## 2020-06-06 DIAGNOSIS — Z885 Allergy status to narcotic agent status: Secondary | ICD-10-CM

## 2020-06-06 HISTORY — PX: TOTAL KNEE ARTHROPLASTY: SHX125

## 2020-06-06 SURGERY — ARTHROPLASTY, KNEE, TOTAL
Anesthesia: Spinal | Site: Knee | Laterality: Left

## 2020-06-06 MED ORDER — SODIUM CHLORIDE 0.9 % IV SOLN: INTRAVENOUS | Status: DC

## 2020-06-06 MED ORDER — CEFAZOLIN SODIUM-DEXTROSE 1-4 GM/50ML-% IV SOLN
1.0000 g | Freq: Four times a day (QID) | INTRAVENOUS | Status: AC
  Administered 2020-06-06 – 2020-06-07 (×2): 1 g via INTRAVENOUS
  Filled 2020-06-06 (×2): qty 50

## 2020-06-06 MED ORDER — NITROFURANTOIN MACROCRYSTAL 100 MG PO CAPS
100.0000 mg | ORAL_CAPSULE | Freq: Every morning | ORAL | Status: DC
  Administered 2020-06-07 – 2020-06-10 (×4): 100 mg via ORAL
  Filled 2020-06-06 (×4): qty 1

## 2020-06-06 MED ORDER — ONDANSETRON HCL 4 MG/2ML IJ SOLN
INTRAMUSCULAR | Status: DC | PRN
  Administered 2020-06-06: 4 mg via INTRAVENOUS

## 2020-06-06 MED ORDER — IRBESARTAN 150 MG PO TABS
150.0000 mg | ORAL_TABLET | Freq: Every day | ORAL | Status: DC
  Administered 2020-06-06 – 2020-06-10 (×5): 150 mg via ORAL
  Filled 2020-06-06 (×5): qty 1

## 2020-06-06 MED ORDER — POLYETHYLENE GLYCOL 3350 17 G PO PACK: 17.0000 g | PACK | Freq: Every day | ORAL | Status: DC | PRN

## 2020-06-06 MED ORDER — ORAL CARE MOUTH RINSE: 15.0000 mL | Freq: Once | OROMUCOSAL | Status: AC

## 2020-06-06 MED ORDER — PROPOFOL 500 MG/50ML IV EMUL
INTRAVENOUS | Status: DC | PRN
  Administered 2020-06-06: 80 ug/kg/min via INTRAVENOUS

## 2020-06-06 MED ORDER — PROPOFOL 500 MG/50ML IV EMUL
INTRAVENOUS | Status: AC
  Filled 2020-06-06: qty 50

## 2020-06-06 MED ORDER — DOCUSATE SODIUM 100 MG PO CAPS
100.0000 mg | ORAL_CAPSULE | Freq: Two times a day (BID) | ORAL | Status: DC
  Administered 2020-06-06 – 2020-06-10 (×8): 100 mg via ORAL
  Filled 2020-06-06 (×8): qty 1

## 2020-06-06 MED ORDER — VITAMIN D 25 MCG (1000 UNIT) PO TABS
1000.0000 [IU] | ORAL_TABLET | Freq: Every day | ORAL | Status: DC
  Administered 2020-06-07 – 2020-06-10 (×4): 1000 [IU] via ORAL
  Filled 2020-06-06 (×4): qty 1

## 2020-06-06 MED ORDER — SACCHAROMYCES BOULARDII 250 MG PO CAPS
250.0000 mg | ORAL_CAPSULE | Freq: Two times a day (BID) | ORAL | Status: DC
  Administered 2020-06-06 – 2020-06-09 (×7): 250 mg via ORAL
  Filled 2020-06-06 (×8): qty 1

## 2020-06-06 MED ORDER — PROPRANOLOL HCL 20 MG PO TABS
20.0000 mg | ORAL_TABLET | ORAL | Status: AC
  Administered 2020-06-06: 20 mg via ORAL
  Filled 2020-06-06: qty 1

## 2020-06-06 MED ORDER — METHOCARBAMOL 500 MG IVPB - SIMPLE MED
INTRAVENOUS | Status: AC
  Filled 2020-06-06: qty 50

## 2020-06-06 MED ORDER — OXYCODONE HCL 5 MG PO TABS: 10.0000 mg | ORAL_TABLET | ORAL | Status: DC | PRN

## 2020-06-06 MED ORDER — ESCITALOPRAM OXALATE 20 MG PO TABS
20.0000 mg | ORAL_TABLET | Freq: Every day | ORAL | Status: DC
  Administered 2020-06-06 – 2020-06-10 (×5): 20 mg via ORAL
  Filled 2020-06-06 (×5): qty 1

## 2020-06-06 MED ORDER — PROPRANOLOL HCL 20 MG PO TABS
20.0000 mg | ORAL_TABLET | Freq: Two times a day (BID) | ORAL | Status: DC
  Administered 2020-06-06 – 2020-06-10 (×8): 20 mg via ORAL
  Filled 2020-06-06 (×8): qty 1

## 2020-06-06 MED ORDER — METHOCARBAMOL 500 MG IVPB - SIMPLE MED
500.0000 mg | Freq: Four times a day (QID) | INTRAVENOUS | Status: DC | PRN
  Administered 2020-06-06: 500 mg via INTRAVENOUS
  Filled 2020-06-06: qty 50

## 2020-06-06 MED ORDER — MIRABEGRON ER 25 MG PO TB24
50.0000 mg | ORAL_TABLET | Freq: Every day | ORAL | Status: DC
  Administered 2020-06-06 – 2020-06-09 (×4): 50 mg via ORAL
  Filled 2020-06-06 (×4): qty 2

## 2020-06-06 MED ORDER — PROPOFOL 10 MG/ML IV BOLUS
INTRAVENOUS | Status: DC | PRN
  Administered 2020-06-06: 20 mg via INTRAVENOUS

## 2020-06-06 MED ORDER — TRANEXAMIC ACID-NACL 1000-0.7 MG/100ML-% IV SOLN
1000.0000 mg | INTRAVENOUS | Status: AC
  Administered 2020-06-06: 1000 mg via INTRAVENOUS
  Filled 2020-06-06: qty 100

## 2020-06-06 MED ORDER — LORATADINE 10 MG PO TABS
10.0000 mg | ORAL_TABLET | Freq: Every day | ORAL | Status: DC
  Administered 2020-06-06 – 2020-06-09 (×4): 10 mg via ORAL
  Filled 2020-06-06 (×4): qty 1

## 2020-06-06 MED ORDER — PHENYLEPHRINE HCL-NACL 10-0.9 MG/250ML-% IV SOLN
INTRAVENOUS | Status: DC | PRN
Start: 2020-06-06 — End: 2020-06-06
  Administered 2020-06-06: 35 ug/min via INTRAVENOUS

## 2020-06-06 MED ORDER — METHOCARBAMOL 500 MG PO TABS
500.0000 mg | ORAL_TABLET | Freq: Four times a day (QID) | ORAL | Status: DC | PRN
  Administered 2020-06-07 – 2020-06-09 (×3): 500 mg via ORAL
  Filled 2020-06-06 (×3): qty 1

## 2020-06-06 MED ORDER — CHLORHEXIDINE GLUCONATE 0.12 % MT SOLN
15.0000 mL | Freq: Once | OROMUCOSAL | Status: AC
  Administered 2020-06-06: 15 mL via OROMUCOSAL

## 2020-06-06 MED ORDER — PHENYLEPHRINE 40 MCG/ML (10ML) SYRINGE FOR IV PUSH (FOR BLOOD PRESSURE SUPPORT)
PREFILLED_SYRINGE | INTRAVENOUS | Status: AC
  Filled 2020-06-06: qty 10

## 2020-06-06 MED ORDER — AMLODIPINE BESYLATE 5 MG PO TABS
2.5000 mg | ORAL_TABLET | Freq: Every day | ORAL | Status: DC
  Administered 2020-06-06 – 2020-06-10 (×5): 2.5 mg via ORAL
  Filled 2020-06-06 (×5): qty 1

## 2020-06-06 MED ORDER — BUPIVACAINE IN DEXTROSE 0.75-8.25 % IT SOLN
INTRATHECAL | Status: DC | PRN
  Administered 2020-06-06: 1.6 mL via INTRATHECAL

## 2020-06-06 MED ORDER — OXYCODONE HCL 5 MG PO TABS: 5.0000 mg | ORAL_TABLET | Freq: Once | ORAL | Status: DC | PRN

## 2020-06-06 MED ORDER — OXYCODONE HCL 5 MG/5ML PO SOLN: 5.0000 mg | Freq: Once | ORAL | Status: DC | PRN

## 2020-06-06 MED ORDER — ONDANSETRON HCL 4 MG/2ML IJ SOLN: 4.0000 mg | Freq: Four times a day (QID) | INTRAMUSCULAR | Status: DC | PRN

## 2020-06-06 MED ORDER — FENTANYL CITRATE (PF) 100 MCG/2ML IJ SOLN
50.0000 ug | INTRAMUSCULAR | Status: DC
  Administered 2020-06-06: 50 ug via INTRAVENOUS
  Filled 2020-06-06: qty 2

## 2020-06-06 MED ORDER — BUPIVACAINE HCL (PF) 0.5 % IJ SOLN
INTRAMUSCULAR | Status: DC | PRN
  Administered 2020-06-06: 30 mL

## 2020-06-06 MED ORDER — FENTANYL CITRATE (PF) 100 MCG/2ML IJ SOLN
INTRAMUSCULAR | Status: AC
  Filled 2020-06-06: qty 2

## 2020-06-06 MED ORDER — STERILE WATER FOR IRRIGATION IR SOLN
Status: DC | PRN
  Administered 2020-06-06: 2000 mL

## 2020-06-06 MED ORDER — ONDANSETRON HCL 4 MG PO TABS: 4.0000 mg | ORAL_TABLET | Freq: Four times a day (QID) | ORAL | Status: DC | PRN

## 2020-06-06 MED ORDER — ALUM & MAG HYDROXIDE-SIMETH 200-200-20 MG/5ML PO SUSP
30.0000 mL | ORAL | Status: DC | PRN
  Filled 2020-06-06: qty 30

## 2020-06-06 MED ORDER — METOCLOPRAMIDE HCL 5 MG/ML IJ SOLN: 5.0000 mg | Freq: Three times a day (TID) | INTRAMUSCULAR | Status: DC | PRN

## 2020-06-06 MED ORDER — BUSPIRONE HCL 5 MG PO TABS
10.0000 mg | ORAL_TABLET | Freq: Two times a day (BID) | ORAL | Status: DC
  Administered 2020-06-06 – 2020-06-10 (×8): 10 mg via ORAL
  Filled 2020-06-06 (×8): qty 2

## 2020-06-06 MED ORDER — VITAMIN B-12 1000 MCG PO TABS
1000.0000 ug | ORAL_TABLET | Freq: Every day | ORAL | Status: DC
  Administered 2020-06-07 – 2020-06-10 (×4): 1000 ug via ORAL
  Filled 2020-06-06 (×3): qty 1

## 2020-06-06 MED ORDER — ONDANSETRON HCL 4 MG/2ML IJ SOLN: 4.0000 mg | Freq: Once | INTRAMUSCULAR | Status: DC | PRN

## 2020-06-06 MED ORDER — METOCLOPRAMIDE HCL 5 MG PO TABS: 5.0000 mg | ORAL_TABLET | Freq: Three times a day (TID) | ORAL | Status: DC | PRN

## 2020-06-06 MED ORDER — TOPIRAMATE 25 MG PO TABS
50.0000 mg | ORAL_TABLET | Freq: Every day | ORAL | Status: DC
  Administered 2020-06-06 – 2020-06-09 (×4): 50 mg via ORAL
  Filled 2020-06-06 (×4): qty 2

## 2020-06-06 MED ORDER — ROPIVACAINE HCL 5 MG/ML IJ SOLN
INTRAMUSCULAR | Status: DC | PRN
  Administered 2020-06-06: 30 mL via PERINEURAL

## 2020-06-06 MED ORDER — CEFAZOLIN SODIUM-DEXTROSE 2-4 GM/100ML-% IV SOLN
2.0000 g | INTRAVENOUS | Status: AC
  Administered 2020-06-06: 2 g via INTRAVENOUS
  Filled 2020-06-06: qty 100

## 2020-06-06 MED ORDER — MIDAZOLAM HCL 2 MG/2ML IJ SOLN
1.0000 mg | INTRAMUSCULAR | Status: DC
  Administered 2020-06-06: 1 mg via INTRAVENOUS
  Filled 2020-06-06: qty 2

## 2020-06-06 MED ORDER — DIPHENHYDRAMINE HCL 12.5 MG/5ML PO ELIX: 12.5000 mg | ORAL_SOLUTION | ORAL | Status: DC | PRN

## 2020-06-06 MED ORDER — BUPIVACAINE HCL (PF) 0.25 % IJ SOLN
INTRAMUSCULAR | Status: AC
  Filled 2020-06-06: qty 30

## 2020-06-06 MED ORDER — PHENYLEPHRINE 40 MCG/ML (10ML) SYRINGE FOR IV PUSH (FOR BLOOD PRESSURE SUPPORT)
PREFILLED_SYRINGE | INTRAVENOUS | Status: DC | PRN
  Administered 2020-06-06: 80 ug via INTRAVENOUS

## 2020-06-06 MED ORDER — LACTATED RINGERS IV SOLN: INTRAVENOUS | Status: DC

## 2020-06-06 MED ORDER — DONEPEZIL HCL 10 MG PO TABS
10.0000 mg | ORAL_TABLET | Freq: Every day | ORAL | Status: DC
  Administered 2020-06-06 – 2020-06-09 (×4): 10 mg via ORAL
  Filled 2020-06-06 (×4): qty 1

## 2020-06-06 MED ORDER — HYDROMORPHONE HCL 1 MG/ML IJ SOLN
0.5000 mg | INTRAMUSCULAR | Status: DC | PRN
  Administered 2020-06-06: 0.5 mg via INTRAVENOUS
  Filled 2020-06-06: qty 1

## 2020-06-06 MED ORDER — 0.9 % SODIUM CHLORIDE (POUR BTL) OPTIME
TOPICAL | Status: DC | PRN
  Administered 2020-06-06: 1000 mL

## 2020-06-06 MED ORDER — PHENOL 1.4 % MT LIQD: 1.0000 | OROMUCOSAL | Status: DC | PRN

## 2020-06-06 MED ORDER — SODIUM CHLORIDE 0.9 % IR SOLN
Status: DC | PRN
  Administered 2020-06-06: 1000 mL

## 2020-06-06 MED ORDER — FESOTERODINE FUMARATE ER 8 MG PO TB24
8.0000 mg | ORAL_TABLET | Freq: Every day | ORAL | Status: DC
  Administered 2020-06-06 – 2020-06-09 (×4): 8 mg via ORAL
  Filled 2020-06-06 (×4): qty 1

## 2020-06-06 MED ORDER — OXYCODONE HCL 5 MG PO TABS
5.0000 mg | ORAL_TABLET | ORAL | Status: DC | PRN
  Administered 2020-06-06 – 2020-06-07 (×3): 5 mg via ORAL
  Administered 2020-06-08: 10 mg via ORAL
  Filled 2020-06-06 (×3): qty 1
  Filled 2020-06-06: qty 2

## 2020-06-06 MED ORDER — FENTANYL CITRATE (PF) 100 MCG/2ML IJ SOLN
25.0000 ug | INTRAMUSCULAR | Status: DC | PRN
  Administered 2020-06-06: 50 ug via INTRAVENOUS

## 2020-06-06 MED ORDER — MENTHOL 3 MG MT LOZG: 1.0000 | LOZENGE | OROMUCOSAL | Status: DC | PRN

## 2020-06-06 MED ORDER — PANTOPRAZOLE SODIUM 40 MG PO TBEC
40.0000 mg | DELAYED_RELEASE_TABLET | Freq: Every day | ORAL | Status: DC
  Administered 2020-06-06 – 2020-06-10 (×5): 40 mg via ORAL
  Filled 2020-06-06 (×5): qty 1

## 2020-06-06 MED ORDER — PROPOFOL 1000 MG/100ML IV EMUL
INTRAVENOUS | Status: AC
  Filled 2020-06-06: qty 100

## 2020-06-06 MED ORDER — DEXAMETHASONE SODIUM PHOSPHATE 10 MG/ML IJ SOLN
INTRAMUSCULAR | Status: DC | PRN
  Administered 2020-06-06: 10 mg via INTRAVENOUS

## 2020-06-06 MED ORDER — APIXABAN 5 MG PO TABS
5.0000 mg | ORAL_TABLET | Freq: Two times a day (BID) | ORAL | Status: DC
  Administered 2020-06-07 – 2020-06-10 (×7): 5 mg via ORAL
  Filled 2020-06-06 (×7): qty 1

## 2020-06-06 MED ORDER — ACETAMINOPHEN 325 MG PO TABS
325.0000 mg | ORAL_TABLET | Freq: Four times a day (QID) | ORAL | Status: DC | PRN
  Administered 2020-06-09 – 2020-06-10 (×3): 650 mg via ORAL
  Filled 2020-06-06 (×3): qty 2

## 2020-06-06 SURGICAL SUPPLY — 63 items
APL SKNCLS STERI-STRIP NONHPOA (GAUZE/BANDAGES/DRESSINGS)
BAG SPEC THK2 15X12 ZIP CLS (MISCELLANEOUS)
BAG ZIPLOCK 12X15 (MISCELLANEOUS) IMPLANT
BASEPLATE TIBIAL TRIATHALON 2 (Plate) ×1 IMPLANT
BENZOIN TINCTURE PRP APPL 2/3 (GAUZE/BANDAGES/DRESSINGS) IMPLANT
BINDER ABDOMINAL 12 ML 46-62 (SOFTGOODS) ×1 IMPLANT
BLADE SAG 18X100X1.27 (BLADE) IMPLANT
BLADE SURG SZ10 CARB STEEL (BLADE) ×4 IMPLANT
BNDG CMPR MED 15X6 ELC VLCR LF (GAUZE/BANDAGES/DRESSINGS) ×1
BNDG ELASTIC 6X15 VLCR STRL LF (GAUZE/BANDAGES/DRESSINGS) ×1 IMPLANT
BNDG ELASTIC 6X5.8 VLCR STR LF (GAUZE/BANDAGES/DRESSINGS) ×2 IMPLANT
BOWL SMART MIX CTS (DISPOSABLE) IMPLANT
BSPLAT TIB 2 CMNT PRM STRL KN (Plate) ×1 IMPLANT
CEMENT BONE SIMPLEX SPEEDSET (Cement) ×2 IMPLANT
COVER SURGICAL LIGHT HANDLE (MISCELLANEOUS) ×2 IMPLANT
COVER WAND RF STERILE (DRAPES) IMPLANT
CUFF TOURN SGL QUICK 34 (TOURNIQUET CUFF) ×2
CUFF TRNQT CYL 34X4.125X (TOURNIQUET CUFF) ×1 IMPLANT
DECANTER SPIKE VIAL GLASS SM (MISCELLANEOUS) IMPLANT
DRAPE U-SHAPE 47X51 STRL (DRAPES) ×2 IMPLANT
DRSG PAD ABDOMINAL 8X10 ST (GAUZE/BANDAGES/DRESSINGS) ×3 IMPLANT
DURAPREP 26ML APPLICATOR (WOUND CARE) ×2 IMPLANT
ELECT REM PT RETURN 15FT ADLT (MISCELLANEOUS) ×2 IMPLANT
FEMORAL PEG DISTAL FIXATION (Orthopedic Implant) ×1 IMPLANT
FEMORAL TRIATH POST STAB  SZ3 (Orthopedic Implant) ×2 IMPLANT
FEMORAL TRIATH POST STAB SZ3 (Orthopedic Implant) IMPLANT
GAUZE SPONGE 4X4 12PLY STRL (GAUZE/BANDAGES/DRESSINGS) ×2 IMPLANT
GAUZE XEROFORM 1X8 LF (GAUZE/BANDAGES/DRESSINGS) IMPLANT
GLOVE BIO SURGEON STRL SZ7.5 (GLOVE) ×2 IMPLANT
GLOVE BIOGEL PI IND STRL 8 (GLOVE) ×2 IMPLANT
GLOVE BIOGEL PI INDICATOR 8 (GLOVE) ×2
GLOVE ECLIPSE 8.0 STRL XLNG CF (GLOVE) ×2 IMPLANT
GOWN STRL REUS W/TWL XL LVL3 (GOWN DISPOSABLE) ×4 IMPLANT
HANDPIECE INTERPULSE COAX TIP (DISPOSABLE) ×2
HOLDER FOLEY CATH W/STRAP (MISCELLANEOUS) IMPLANT
IMMOBILIZER KNEE 20 (SOFTGOODS) ×2
IMMOBILIZER KNEE 20 THIGH 36 (SOFTGOODS) ×1 IMPLANT
IMMOBILIZER KNEE 22 UNIV (SOFTGOODS) ×1 IMPLANT
INSERT TIB BEARING SZ 2 11 (Knees) ×1 IMPLANT
KIT TURNOVER KIT A (KITS) IMPLANT
NS IRRIG 1000ML POUR BTL (IV SOLUTION) ×2 IMPLANT
PACK TOTAL KNEE CUSTOM (KITS) ×2 IMPLANT
PADDING CAST COTTON 6X4 STRL (CAST SUPPLIES) ×4 IMPLANT
PADDING CAST SYN 6 (CAST SUPPLIES) ×2
PADDING CAST SYNTHETIC 6X4 NS (CAST SUPPLIES) IMPLANT
PATELLA TRIATHLON SZ 29 9 MM (Orthopedic Implant) ×1 IMPLANT
PENCIL SMOKE EVACUATOR (MISCELLANEOUS) IMPLANT
PIN FLUTED HEDLESS FIX 3.5X1/8 (PIN) ×1 IMPLANT
PROTECTOR NERVE ULNAR (MISCELLANEOUS) ×2 IMPLANT
SET HNDPC FAN SPRY TIP SCT (DISPOSABLE) ×1 IMPLANT
SET PAD KNEE POSITIONER (MISCELLANEOUS) ×2 IMPLANT
STAPLER VISISTAT 35W (STAPLE) IMPLANT
STRIP CLOSURE SKIN 1/2X4 (GAUZE/BANDAGES/DRESSINGS) IMPLANT
SUT MNCRL AB 4-0 PS2 18 (SUTURE) IMPLANT
SUT VIC AB 0 CT1 27 (SUTURE) ×2
SUT VIC AB 0 CT1 27XBRD ANTBC (SUTURE) ×1 IMPLANT
SUT VIC AB 1 CT1 36 (SUTURE) ×4 IMPLANT
SUT VIC AB 2-0 CT1 27 (SUTURE) ×4
SUT VIC AB 2-0 CT1 TAPERPNT 27 (SUTURE) ×2 IMPLANT
TRAY FOLEY MTR SLVR 16FR STAT (SET/KITS/TRAYS/PACK) ×2 IMPLANT
WATER STERILE IRR 1000ML POUR (IV SOLUTION) ×2 IMPLANT
WRAP KNEE MAXI GEL POST OP (GAUZE/BANDAGES/DRESSINGS) ×2 IMPLANT
YANKAUER SUCT BULB TIP 10FT TU (MISCELLANEOUS) ×2 IMPLANT

## 2020-06-06 NOTE — Anesthesia Postprocedure Evaluation (Signed)
Anesthesia Post Note  Patient: [name]  Procedure(s) Performed: LEFT TOTAL KNEE ARTHROPLASTY (Left Knee)     Patient location during evaluation: PACU Anesthesia Type: Spinal Level of consciousness: oriented and awake and alert Pain management: pain level controlled Vital Signs Assessment: post-procedure vital signs reviewed and stable Respiratory status: spontaneous breathing, respiratory function stable and nonlabored ventilation Cardiovascular status: blood pressure returned to baseline and stable Postop Assessment: no headache, no backache, no apparent nausea or vomiting and spinal receding Anesthetic complications: no   No complications documented.  Last Vitals:  Vitals:   06/06/20 1530 06/06/20 1545  BP: 129/65 (!) 146/74  Pulse: (!) 55 (!) 51  Resp: (!) 24 15  Temp:    SpO2: 98% 100%    Last Pain:  Vitals:   06/06/20 1600  TempSrc:   PainSc: [place]

## 2020-06-06 NOTE — Anesthesia Preprocedure Evaluation (Addendum)
Anesthesia Evaluation  Patient identified by MRN, date of birth, ID band Patient awake    Reviewed: Allergy & Precautions, NPO status , Patient's Chart, lab work & pertinent test results  History of Anesthesia Complications Negative for: history of anesthetic complications  Airway Mallampati: II  TM Distance: >3 FB Neck ROM: Full    Dental  (+) Teeth Intact   Pulmonary sleep apnea , former smoker, PE   Pulmonary exam normal        Cardiovascular hypertension, + DVT  Normal cardiovascular exam     Neuro/Psych negative neurological ROS  negative psych ROS   GI/Hepatic Neg liver ROS, GERD  ,  Endo/Other  negative endocrine ROS  Renal/GU negative Renal ROS  negative genitourinary   Musculoskeletal  (+) Arthritis ,   Abdominal   Peds  Hematology Eliquis- last dose 6/19   Anesthesia Other Findings   Reproductive/Obstetrics                            Anesthesia Physical Anesthesia Plan  ASA: III  Anesthesia Plan: Spinal   Post-op Pain Management:    Induction:   PONV Risk Score and Plan: 2 and Propofol infusion, Treatment may vary due to age or medical condition, Ondansetron and TIVA  Airway Management Planned: Nasal Cannula and Simple Face Mask  Additional Equipment: None  Intra-op Plan:   Post-operative Plan:   Informed Consent: I have reviewed the patients History and Physical, chart, labs and discussed the procedure including the risks, benefits and alternatives for the proposed anesthesia with the patient or authorized representative who has indicated his/her understanding and acceptance.       Plan Discussed with:   Anesthesia Plan Comments:         Anesthesia Quick Evaluation

## 2020-06-06 NOTE — Progress Notes (Signed)
AssistedDr. Carolyn Witman with left, ultrasound guided, adductor canal block. Side rails up, monitors on throughout procedure. See vital signs in flow sheet. Tolerated Procedure well.  

## 2020-06-06 NOTE — Care Plan (Signed)
Ortho Bundle Case Management Note  Patient Details  Name: [name] MRN: [id number] Date of Birth: 11/05/1941   CM spoke to patient regarding her upcoming surgery with Dr. Ninfa Linden on 06/06/20. She is an Ortho bundle patient through [facility]. She is agreeable to CM. Patient lives at [facility] independent living and will be returning after a short hospital stay. She will have assistance from workers there at the facility as well as therapy from the Outpatient Rehab department there as well. Orders have been provided already to the [facility] staff regarding rehab for PT and possibly OT. Patient has all DME needed post-op. CM will continue to follow along for any needs.                  DME Arranged:   (NO DME needed- patient has a FWW and BSC at home) DME Agency:     HH Arranged:    [place] Agency:   (Do not anticipate any HH will be needed; patient is a resident at [facility] living community and will be receiving therapy from staff there)  Additional Comments: Please contact me with any questions of if this plan should need to change.  Jamse Arn, RN, BSN, [employer]  [phone] 06/06/2020, 10:59 AM

## 2020-06-06 NOTE — Anesthesia Procedure Notes (Signed)
Anesthesia Regional Block: Adductor canal block   Pre-Anesthetic Checklist: ,, timeout performed, Correct Patient, Correct Site, Correct Laterality, Correct Procedure, Correct Position, site marked, Risks and benefits discussed,  Surgical consent,  Pre-op evaluation,  At surgeon's request and post-op pain management  Laterality: Left  Prep: chloraprep       Needles:  Injection technique: Single-shot  Needle Type: Echogenic Stimulator Needle     Needle Length: 10cm  Needle Gauge: 20     Additional Needles:   Procedures:,,,, ultrasound used (permanent image in chart),,,,  Narrative:  Start time: 06/06/2020 1:08 PM End time: 06/06/2020 1:12 PM Injection made incrementally with aspirations every 5 mL.  Performed by: Personally  Anesthesiologist: Lidia Collum, MD  Additional Notes: Standard monitors applied. Skin prepped. Good needle visualization with ultrasound. Injection made in 5cc increments with no resistance to injection. Patient tolerated the procedure well.

## 2020-06-06 NOTE — Interval H&P Note (Signed)
History and Physical Interval Note: The patient understands she is here for a left total knee arthroplasty to treat her knee osteoarthritis.  There is been no acute change in her medical status.  See previous H&P.  The risk and benefits of surgery been explained in detail and informed consent is obtained.  The left knee has been marked.  06/06/2020 12:00 PM  [name]  has presented today for surgery, with the diagnosis of osteoarthritis left knee.  The various methods of treatment have been discussed with the patient and family. After consideration of risks, benefits and other options for treatment, the patient has consented to  Procedure(s): LEFT TOTAL KNEE ARTHROPLASTY (Left) as a surgical intervention.  The patient's history has been reviewed, patient examined, no change in status, stable for surgery.  I have reviewed the patient's chart and labs.  Questions were answered to the patient's satisfaction.     [name]

## 2020-06-06 NOTE — Brief Op Note (Signed)
06/06/2020  2:52 PM  PATIENT:  [name]  79 y.o. female  PRE-OPERATIVE DIAGNOSIS:  osteoarthritis left knee  POST-OPERATIVE DIAGNOSIS:  osteoarthritis left knee  PROCEDURE:  Procedure(s): LEFT TOTAL KNEE ARTHROPLASTY (Left)  SURGEON:  Surgeon(s) and Role:    [name], MD - Primary  PHYSICIAN ASSISTANT:  Benita Stabile, PA-C  ANESTHESIA:   local, regional and spinal  EBL:  50 mL   COUNTS:  YES  TOURNIQUET:   Total Tourniquet Time Documented: Thigh (Left) - -13 minutes Total: Thigh (Left) - -13 minutes   DICTATION: .Other Dictation: Dictation Number [id number]  PLAN OF CARE: Admit for overnight observation  PATIENT DISPOSITION:  PACU - hemodynamically stable.   Delay start of Pharmacological VTE agent (>24hrs) due to surgical blood loss or risk of bleeding: no

## 2020-06-06 NOTE — Transfer of Care (Signed)
Immediate Anesthesia Transfer of Care Note  Patient: [name]  Procedure(s) Performed: LEFT TOTAL KNEE ARTHROPLASTY (Left Knee)  Patient Location: PACU  Anesthesia Type:spinal  Level of Consciousness: awake and drowsy  Airway & Oxygen Therapy: Patient Spontanous Breathing and Patient connected to face mask oxygen  Post-op Assessment: Report given to RN and Post -op Vital signs reviewed and stable  Post vital signs: Reviewed and stable  Last Vitals:  Vitals Value Taken Time  BP 108/72 06/06/20 1517  Temp    Pulse 52 06/06/20 1520  Resp 16 06/06/20 1520  SpO2 100 % 06/06/20 1520  Vitals shown include unvalidated device data.  Last Pain:  Vitals:   06/06/20 1040  TempSrc:   PainSc: 0-No pain         Complications: No complications documented.

## 2020-06-07 DIAGNOSIS — G250 Essential tremor: Secondary | ICD-10-CM | POA: Diagnosis present

## 2020-06-07 DIAGNOSIS — Z96641 Presence of right artificial hip joint: Secondary | ICD-10-CM

## 2020-06-07 DIAGNOSIS — M1712 Unilateral primary osteoarthritis, left knee: Secondary | ICD-10-CM | POA: Diagnosis present

## 2020-06-07 DIAGNOSIS — I10 Essential (primary) hypertension: Secondary | ICD-10-CM | POA: Diagnosis present

## 2020-06-07 DIAGNOSIS — Z884 Allergy status to anesthetic agent status: Secondary | ICD-10-CM | POA: Diagnosis not present

## 2020-06-07 DIAGNOSIS — Z20822 Contact with and (suspected) exposure to covid-19: Secondary | ICD-10-CM | POA: Diagnosis present

## 2020-06-07 DIAGNOSIS — Z87891 Personal history of nicotine dependence: Secondary | ICD-10-CM | POA: Diagnosis not present

## 2020-06-07 DIAGNOSIS — Z888 Allergy status to other drugs, medicaments and biological substances status: Secondary | ICD-10-CM | POA: Diagnosis not present

## 2020-06-07 DIAGNOSIS — M810 Age-related osteoporosis without current pathological fracture: Secondary | ICD-10-CM | POA: Diagnosis present

## 2020-06-07 DIAGNOSIS — Z79899 Other long term (current) drug therapy: Secondary | ICD-10-CM | POA: Diagnosis not present

## 2020-06-07 DIAGNOSIS — Z86711 Personal history of pulmonary embolism: Secondary | ICD-10-CM | POA: Diagnosis not present

## 2020-06-07 DIAGNOSIS — G4733 Obstructive sleep apnea (adult) (pediatric): Secondary | ICD-10-CM | POA: Diagnosis present

## 2020-06-07 DIAGNOSIS — Z85828 Personal history of other malignant neoplasm of skin: Secondary | ICD-10-CM | POA: Diagnosis not present

## 2020-06-07 DIAGNOSIS — K219 Gastro-esophageal reflux disease without esophagitis: Secondary | ICD-10-CM | POA: Diagnosis present

## 2020-06-07 DIAGNOSIS — H9190 Unspecified hearing loss, unspecified ear: Secondary | ICD-10-CM | POA: Diagnosis present

## 2020-06-07 DIAGNOSIS — Z885 Allergy status to narcotic agent status: Secondary | ICD-10-CM | POA: Diagnosis not present

## 2020-06-07 DIAGNOSIS — Z7901 Long term (current) use of anticoagulants: Secondary | ICD-10-CM | POA: Diagnosis not present

## 2020-06-07 LAB — CBC
HCT: 34.8 % — ABNORMAL LOW (ref 36.0–46.0)
Hemoglobin: 11.5 g/dL — ABNORMAL LOW (ref 12.0–15.0)
MCH: 31.8 pg (ref 26.0–34.0)
MCHC: 33 g/dL (ref 30.0–36.0)
MCV: 96.1 fL (ref 80.0–100.0)
Platelets: 183 10*3/uL (ref 150–400)
RBC: 3.62 MIL/uL — ABNORMAL LOW (ref 3.87–5.11)
RDW: 13.5 % (ref 11.5–15.5)
WBC: 12.5 10*3/uL — ABNORMAL HIGH (ref 4.0–10.5)
nRBC: 0 % (ref 0.0–0.2)

## 2020-06-07 LAB — BASIC METABOLIC PANEL
Anion gap: 7 (ref 5–15)
BUN: 15 mg/dL (ref 8–23)
CO2: 23 mmol/L (ref 22–32)
Calcium: 8.1 mg/dL — ABNORMAL LOW (ref 8.9–10.3)
Chloride: 102 mmol/L (ref 98–111)
Creatinine, Ser: 0.54 mg/dL (ref 0.44–1.00)
GFR calc Af Amer: >60 mL/min (ref >60)
GFR calc non Af Amer: >60 mL/min (ref >60)
Glucose, Bld: 165 mg/dL — ABNORMAL HIGH (ref 70–99)
Potassium: 3.9 mmol/L (ref 3.5–5.1)
Sodium: 132 mmol/L — ABNORMAL LOW (ref 135–145)

## 2020-06-07 NOTE — Plan of Care (Signed)
Plan of care reviewed and discussed with the patient. 

## 2020-06-07 NOTE — Progress Notes (Signed)
Subjective: 1 Day Post-Op Procedure(s) (LRB): LEFT TOTAL KNEE ARTHROPLASTY (Left) Patient reports pain as moderate.    Objective: Vital signs in last 24 hours: Temp:  [96.5 F (35.8 C)-98 F (36.7 C)] 97.6 F (36.4 C) (06/26 1040) Pulse Rate:  [51-72] 59 (06/26 1040) Resp:  [12-24] 18 (06/26 1040) BP: (108-156)/(26-98) 132/63 (06/26 1040) SpO2:  [97 %-100 %] 98 % (06/26 1040)  Intake/Output from previous day: 06/25 0701 - 06/26 0700 In: 2662.5 [P.O.:360; I.V.:1952.5; IV Piggyback:350] Out: 650 [Urine:600; Blood:50] Intake/Output this shift: Total I/O In: 120 [P.O.:120] Out: -   Recent Labs    06/07/20 0332  HGB 11.5*   Recent Labs    06/07/20 0332  WBC 12.5*  RBC 3.62*  HCT 34.8*  PLT 183   Recent Labs    06/07/20 0332  NA 132*  K 3.9  CL 102  CO2 23  BUN 15  CREATININE 0.54  GLUCOSE 165*  CALCIUM 8.1*   No results for input(s): LABPT, INR in the last 72 hours.  Sensation intact distally Intact pulses distally Dorsiflexion/Plantar flexion intact Incision: dressing C/D/I   Assessment/Plan: 1 Day Post-Op Procedure(s) (LRB): LEFT TOTAL KNEE ARTHROPLASTY (Left) Up with therapy According to therapy notes, needing assistance with mobility.  Definitely not safe for discharge to home yet.  Anticipate hopefully that we can get her home Monday afternoon with HHPT.  Will change to Inpatient Admission.    [name] 06/07/2020, 10:59 AM

## 2020-06-07 NOTE — Progress Notes (Signed)
   06/07/20 1420  PT Visit Information  Last PT Received On 06/07/20 Pt back in bed, sleepy. willing to work on TKA HEP with encouragement. Son present.   Assistance Needed +2  History of Present Illness s/p L TKA. PMH: bil RCR, back surgery 2013, DVT/PE, tremor  Subjective Data  Patient Stated Goal get stronger  Precautions  Precautions Fall;Knee  Required Braces or Orthoses Knee Immobilizer - Left  Knee Immobilizer - Left Discontinue once straight leg raise with < 10 degree lag  Pain Assessment  Pain Assessment 0-10  Pain Location left knee  Pain Descriptors / Indicators Grimacing;Sore  Pain Intervention(s) Limited activity within patient's tolerance;Monitored during session;Repositioned;Ice applied  Cognition  Arousal/Alertness Awake/alert  Behavior During Therapy WFL for tasks assessed/performed  Overall Cognitive Status Within Functional Limits for tasks assessed  Exercises  Exercises Total Joint  Total Joint Exercises  Ankle Circles/Pumps AROM;Both;10 reps  Quad Sets AROM;Both;10 reps  Heel Slides AAROM;PROM;Left;10 reps  Hip ABduction/ADduction AROM;Left;10 reps (5 reps on R )  Straight Leg Raises AAROM;Left;10 reps  Goniometric ROM incr muscle guarding, L knee flexion ~8 to 40 degrees   PT - End of Session  Activity Tolerance Patient limited by fatigue  Patient left with call bell/phone within reach;in bed;with family/visitor present;with SCD's reapplied;with bed alarm set   PT - Assessment/Plan  PT Plan Current plan remains appropriate  PT Visit Diagnosis Difficulty in walking, not elsewhere classified (R26.2);Muscle weakness (generalized) (M62.81)  PT Frequency (ACUTE ONLY) 7X/week  Follow Up Recommendations Follow surgeon's recommendation for DC plan and follow-up therapies (may need SNF )  PT equipment None recommended by PT  AM-PAC PT "6 Clicks" Mobility Outcome Measure (Version 2)  Help needed turning from your back to your side while in a flat bed without  using bedrails? 2  Help needed moving from lying on your back to sitting on the side of a flat bed without using bedrails? 2  Help needed moving to and from a bed to a chair (including a wheelchair)? 2  Help needed standing up from a chair using your arms (e.g., wheelchair or bedside chair)? 2  Help needed to walk in hospital room? 1  Help needed climbing 3-5 steps with a railing?  1  6 Click Score 10  Consider Recommendation of Discharge To: CIR/SNF/LTACH  PT Goal Progression  Progress towards PT goals Progressing toward goals  Acute Rehab PT Goals  PT Goal Formulation With patient  Time For Goal Achievement 06/14/20  Potential to Achieve Goals Good  PT Time Calculation  PT Start Time (ACUTE ONLY) 1354  PT Stop Time (ACUTE ONLY) 1420  PT Time Calculation (min) (ACUTE ONLY) 26 min  PT General Charges  $$ ACUTE PT VISIT 1 Visit  PT Treatments  $Therapeutic Exercise 23-37 mins

## 2020-06-07 NOTE — Op Note (Signed)
NAME: [name], [name] MEDICAL RECORD TI:[id number] ACCOUNT [id number] DATE OF BIRTH:07/26/1941 FACILITY: [facility] LOCATION: [id number] PHYSICIAN:Renette Hsu [place], MD  OPERATIVE REPORT  DATE OF PROCEDURE:  06/06/2020  PREOPERATIVE DIAGNOSIS:  Primary osteoarthritis and degenerative joint disease, left knee.  POSTOPERATIVE DIAGNOSIS:  Primary osteoarthritis and degenerative joint disease, left knee.  PROCEDURE:  Left total knee arthroplasty.  IMPLANTS:  Stryker Triathlon cemented knee system with size 3 femur, size 2 tibial tray, 11 mm fixed bearing polyethylene insert, size 29 patellar button.  SURGEON:  Jean Rosenthal, MD  ASSISTANT:  Erskine Emery, PA-C.  ANESTHESIA: 1.  Left lower extremity adductor canal block. 2.  Spinal. 3.  Intraarticular injection of plain Marcaine.   TOURNIQUET TIME:  Less than 1 hour.  ESTIMATED BLOOD LOSS:  Less than 100 mL.  ANTIBIOTICS:  Two grams IV Ancef.  COMPLICATIONS:  None.  INDICATIONS:  The patient is an 79 year old female well known to me.  She has developed debilitating arthritis involving her left knee.  She has tried and failed all forms of conservative treatment and at this point, her left knee pain has become daily  and is detrimentally affecting her mobility, her quality of life and activities of daily living to the +1 point proceeding with a knee replacement.  We had a long and thorough discussion about this.  I have talked about the risks and benefits of surgery  including the risk of acute blood loss anemia, nerve or vessel injury, fracture, infection, DVT, and implant failure.  We talked about our goals being decreased pain, improved mobility and overall improve quality of life.  DESCRIPTION OF PROCEDURE:  After informed consent was obtained and appropriate left knee was marked.  She was brought to the operating room after an adductor canal block was obtained in the holding room of the left lower extremity in the operating  room.   She was placed on the operating table and sat up and spinal anesthesia was then obtained.  She was then laid in supine position.  A Foley catheter was placed and nonsterile tourniquet was placed around her upper left thigh.  Her left thigh, knee, leg,  ankle and foot were prepped and draped with DuraPrep and sterile drapes including a sterile stockinette.  An Esmarch was used to wrap that leg and tourniquet was inflated to 300 mm of pressure.  Of note, we did have a timeout and she was identified,  correct patient, correct left knee.  I then made an incision over the patella and carried this proximally and distally.  We dissected down the knee joint carried out a medial parapatellar arthrotomy finding a moderate joint effusion.  There was  significant arthritis in the patellofemoral joint and medial compartment of the knee.  With the knee in a flexed position, we removed remnants of ACL, PCL, medial and lateral meniscus.  We removed all the osteophytes from the knee.  We then used the  extramedullary cutting guide for making our proximal tibia cut.  We set this for correcting for varus and valgus and neutral slope and we set our cutting guide to take 9 mm off the high side.  We made this cut without difficulty.  We then used an  intramedullary drill for making our distal femoral cut with our intramedullary guide set for a left knee at 5 degrees externally rotated and a 9 mm distal femoral cut.  We made this cut without difficulty and brought the knee back down to full extension.   We cleaned  more debris from the back of the knee and then we placed a 9 mm extension block.  We had achieved full extension.  We then went back to the femur and put our femoral sizing guide based off the epicondylar axis.  Based off this, we chose a  size 3 femur, we put a 4-in-1 cutting block for size 3 femur and then made our anterior and posterior cuts and our chamfer cuts.  We then made our femoral box cut.  We then  went back to the tibia and chose a tibia tray that was a 2 for coverage of the  tibia setting the rotation off the tibial tubercle and the femur.  We then made our keel punch off of this  with a size 2 trial tibia and a stylus 3 left femur.  We tried a 9 mm fixed bearing insert.  We did feel like she needed 2 more millimeters of  polyethylene for stability.  We then made our patellar cut and drilled 3 holes for a size 29 patellar button.  We then put the knee through several ranges of motion with trial implants in place, we were pleased with stability.  We then removed all trial  implants from the knee and inserted our Marcaine around the arthrotomy.  We irrigated the knee with normal saline solution using pulsatile lavage and a drain and then dried it very well.  With the knee in a flexed position, we then cemented.  After  mixing our cement our size 2 tibial tray followed by our size 3 left femur.  Once the cement debris was removed from the knee, we placed our 11 mm fixed bearing polyethylene insert.  We then cemented our patellar button as well.  We then held the knee in  a fully hyperextended position compressing into the knee.  Once the cement had hardened, we let the tourniquet down.  Hemostasis obtained with electrocautery.  We irrigated the knee again and then closed the arthrotomy with interrupted #1 Vicryl suture  followed by closing 0 Vicryl in the deep tissue and 2-0 Vicryl subcutaneous tissue and interrupted staples were used to reapproximate the skin.  Xeroform well-padded sterile dressing was applied.  She was taken to recovery room in stable condition.  All  final counts were correct.  There were no complications noted.    Of note, Benita Stabile, PA-C, assisted the entire case and assistance was crucial for facilitating all aspects of this case.  PN/NUANCE  D:06/06/2020 T:06/07/2020 JOB:[id number]

## 2020-06-07 NOTE — Plan of Care (Signed)
  Problem: Health Behavior/Discharge Planning: Goal: Ability to manage health-related needs will improve Outcome: Progressing   Problem: Coping: Goal: Level of anxiety will decrease Outcome: Progressing   

## 2020-06-07 NOTE — Evaluation (Signed)
Physical Therapy Evaluation Patient Details Name: [name] MRN: [id number] DOB: 21-Sep-1941 Today's Date: 06/07/2020   History of Present Illness  s/p L TKA. PMH: bil RCR, back surgery 2013, DVT/PE, tremor  Clinical Impression  Pt is s/p TKA resulting in the deficits listed below (see PT Problem List).  Pt requiring +2 assist for bed mobility and stand pivot transfers. Pt would likely benefit from SNF at [facility], pt reports there are no beds available and that she has been on the waiting list for 20d. Will continue to follow in acute setting.   Pt will benefit from skilled PT to increase their independence and safety with mobility to allow discharge to the venue listed below.      Follow Up Recommendations Follow surgeon's recommendation for DC plan and follow-up therapies (may needs SNF, pt reports no beds at [facility])    Equipment Recommendations  None recommended by PT    Recommendations for Other Services       Precautions / Restrictions Precautions Precautions: Fall;Knee Required Braces or Orthoses: Knee Immobilizer - Left Knee Immobilizer - Left: Discontinue once straight leg raise with < 10 degree lag Restrictions Weight Bearing Restrictions: No Other Position/Activity Restrictions: WBAT      Mobility  Bed Mobility Overal bed mobility: Needs Assistance Bed Mobility: Supine to Sit     Supine to sit: Mod assist;Max assist     General bed mobility comments: assist with bil LEs and trunk, incr time, heavy use of bed rail  Transfers Overall transfer level: Needs assistance Equipment used: Rolling walker (2 wheeled) Transfers: Sit to/from Stand Sit to Stand: Max assist;Mod assist;+2 safety/equipment;+2 physical assistance         General transfer comment: incr time, assist to rise and transition to RW, posterior LOB  Ambulation/Gait Ambulation/Gait assistance: Mod assist;+2 physical assistance;+2 safety/equipment Gait Distance (Feet): 5  Feet Assistive device: Rolling walker (2 wheeled) Gait Pattern/deviations: Step-to pattern     General Gait Details: pivotal steps bed to chair. requiring +2 assist to balance, maneuver RW. repeated posterior LOB  Stairs            Wheelchair Mobility    Modified Rankin (Stroke Patients Only)       Balance                                             Pertinent Vitals/Pain Pain Assessment: 0-10 Pain Score: 2  Pain Location: left knee Pain Descriptors / Indicators: Grimacing Pain Intervention(s): Monitored during session;Limited activity within patient's tolerance;Premedicated before session;Repositioned    Home Living Family/patient expects to be discharged to:: Private residence Living Arrangements: Other (Comment) Available Help at Discharge: Family Type of Home: Independent living facility Home Access: Level entry     Home Layout: One level Home Equipment: Environmental consultant - 4 wheels;Walker - 2 wheels;Wheelchair - manual Additional Comments: has hired help, husband is in [facility]/SNF    Prior Function Level of Independence: Independent;Independent with assistive device(s)         Comments: amb with rollator --3 wheeled or 4 wheeled     Hand Dominance        Extremity/Trunk Assessment   Upper Extremity Assessment Upper Extremity Assessment: Generalized weakness    Lower Extremity Assessment Lower Extremity Assessment: LLE deficits/detail;Generalized weakness LLE Deficits / Details: ankle grossly WFL; knee extension and hip flexion 2/5, limited by post op pain  and weakness       Communication   Communication: No difficulties  Cognition                                              General Comments      Exercises     Assessment/Plan    PT Assessment Patient needs continued PT services  PT Problem List Decreased strength;Decreased activity tolerance;Decreased mobility;Decreased knowledge of use of  DME       PT Treatment Interventions DME instruction;Gait training;Functional mobility training;Therapeutic activities;Patient/family education;Therapeutic exercise    PT Goals (Current goals can be found in the Care Plan section)  Acute Rehab PT Goals Patient Stated Goal: get stronger PT Goal Formulation: With patient Time For Goal Achievement: 06/14/20 Potential to Achieve Goals: Good    Frequency 7X/week   Barriers to discharge        Co-evaluation               AM-PAC PT "6 Clicks" Mobility  Outcome Measure Help needed turning from your back to your side while in a flat bed without using bedrails?: A Lot Help needed moving from lying on your back to sitting on the side of a flat bed without using bedrails?: A Lot Help needed moving to and from a bed to a chair (including a wheelchair)?: A Lot Help needed standing up from a chair using your arms (e.g., wheelchair or bedside chair)?: A Lot Help needed to walk in hospital room?: Total Help needed climbing 3-5 steps with a railing? : Total 6 Click Score: 10    End of Session Equipment Utilized During Treatment: Gait belt;Left knee immobilizer Activity Tolerance: Patient limited by fatigue;Patient limited by pain Patient left: in chair;with call bell/phone within reach;with chair alarm set   PT Visit Diagnosis: Difficulty in walking, not elsewhere classified (R26.2);Muscle weakness (generalized) (M62.81)    Time: 7371-0626 PT Time Calculation (min) (ACUTE ONLY): 35 min   Charges:   PT Evaluation $PT Eval Low Complexity: 1 Low PT Treatments $Gait Training: 8-22 mins        Baxter Flattery, PT  Acute Rehab Dept ([place]) [phone] Pager [phone]  06/07/2020   [facility] 06/07/2020, 12:03 PM

## 2020-06-08 MED ORDER — HYDROCODONE-ACETAMINOPHEN 5-325 MG PO TABS: 1.0000 | ORAL_TABLET | ORAL | Status: DC | PRN

## 2020-06-08 NOTE — TOC Progression Note (Signed)
Transition of Care [facility]) - Progression Note    Patient Details  Name: [name] MRN: [id number] Date of Birth: 09/14/1941  Transition of Care [facility]) CM/SW Contact  Servando Snare, [place] Phone Number: 06/08/2020, 2:21 PM  Clinical Narrative:   Patient has 24 hour caregivers in independent living at [facility].       Expected Discharge Plan and Services                           DME Arranged:  (NO DME needed- patient has a FWW and BSC at home)           [place]:  (Do not anticipate any HH will be needed; patient is a resident at [facility] and will be receiving therapy from staff there)         Social Determinants of Health (SDOH) Interventions    Readmission Risk Interventions No flowsheet data found.

## 2020-06-08 NOTE — Progress Notes (Signed)
PT Cancellation Note  Patient Details Name: [name] MRN: [id number] DOB: 1941/06/18   Cancelled Treatment:    Reason Eval/Treat Not Completed: Other (comment);Medical issues which prohibited therapy. Pt continues to be too lethargic to participate with PT. Able to arouse pt however she is unable to maintain. Family concerned about pt d/c to ILF, they feel she needs SNF. If pt does not progress well in acute setting, likely  will need SNF. Per family pt was having difficulty with mobility/basic tasks prior to TKA.    [facility] 06/08/2020, 4:08 PM

## 2020-06-08 NOTE — Progress Notes (Signed)
Dr. Ninfa Linden notified of pt lethargy and overall fatigue today. Physical therapy was limited due to this today. Pt has been responsive and will wake up to voice and touch. Md did change pain medication based on this conversation.

## 2020-06-08 NOTE — Progress Notes (Signed)
Physical Therapy Treatment Patient Details Name: [name] MRN: [id number] DOB: [birth year] Today's Date: 06/08/2020    History of Present Illness s/p L TKA. PMH: bil RCR, back surgery 2013, DVT/PE, tremor    PT Comments    Pt extremely lethargic this am. dtr present.  Reviewed HEP, pt minimally participatory, requiring multi-modal cues to stay awake. dtr reports if pt requires incr assist or assist of 2 that [facility] staff will not be able to provide that level of care.     Follow Up Recommendations  Follow surgeon's recommendation for DC plan and follow-up therapies (may need SNF )     Equipment Recommendations  None recommended by PT    Recommendations for Other Services       Precautions / Restrictions Precautions Precautions: Fall;Knee Required Braces or Orthoses: Knee Immobilizer - Left Knee Immobilizer - Left: Discontinue once straight leg raise with < 10 degree lag    Mobility  Bed Mobility               General bed mobility comments:  (unable d/t lethargy)  Transfers                    Ambulation/Gait                 Stairs             Wheelchair Mobility    Modified Rankin (Stroke Patients Only)       Balance                                            Cognition Arousal/Alertness: Lethargic;Suspect due to medications Behavior During Therapy: [facility] for tasks assessed/performed                                   General Comments: difficult to assess d/t lethargy, 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      Exercises Total Joint Exercises Ankle Circles/Pumps: Both;10 reps;AAROM Heel Slides: AAROM;PROM;10 reps;Both Hip ABduction/ADduction: 10 reps;AAROM;PROM;Both (5 reps on R ) Straight Leg Raises: AAROM;10 reps;PROM;Both    General Comments        Pertinent Vitals/Pain Pain Assessment: Faces Faces Pain Scale: Hurts a little bit Pain Location: left  knee Pain Descriptors / Indicators: Grimacing;Sore Pain Intervention(s): Monitored during session;Repositioned    Home Living                      Prior Function            PT Goals (current goals can now be found in the care plan section) Acute Rehab PT Goals Patient Stated Goal: get stronger PT Goal Formulation: With patient Time For Goal Achievement: 06/14/20 Potential to Achieve Goals: Good Progress towards PT goals: Not progressing toward goals - comment (lethargic)    Frequency    7X/week      PT Plan Current plan remains appropriate    Co-evaluation              AM-PAC PT "6 Clicks" Mobility   Outcome Measure  Help needed turning from your back to your side while in a flat bed without using bedrails?: A Lot Help needed moving from lying on your back to sitting on the side of a flat  bed without using bedrails?: Total Help needed moving to and from a bed to a chair (including a wheelchair)?: Total Help needed standing up from a chair using your arms (e.g., wheelchair or bedside chair)?: Total Help needed to walk in hospital room?: Total Help needed climbing 3-5 steps with a railing? : Total 6 Click Score: 7    End of Session   Activity Tolerance: Patient limited by lethargy Patient left: with call bell/phone within reach;in bed;with family/visitor present;with SCD's reapplied;with bed alarm set   PT Visit Diagnosis: Difficulty in walking, not elsewhere classified (R26.2);Muscle weakness (generalized) (M62.81)     Time: 0263-7858 PT Time Calculation (min) (ACUTE ONLY): 12 min  Charges:  $Therapeutic Exercise: 8-22 mins                     Baxter Flattery, PT  Acute Rehab Dept ([place]) [phone] Pager [phone]  06/08/2020    [facility] 06/08/2020, 12:22 PM

## 2020-06-08 NOTE — Progress Notes (Addendum)
Subjective: 2 Days Post-Op Procedure(s) (LRB): LEFT TOTAL KNEE ARTHROPLASTY (Left) Patient reports pain as moderate to severe.  Reports nausea this AM.    Objective: Vital signs in last 24 hours: Temp:  [97.6 F (36.4 C)-99.1 F (37.3 C)] 97.9 F (36.6 C) (06/27 0452) Pulse Rate:  [59-77] 75 (06/27 0452) Resp:  [16-20] 16 (06/27 0452) BP: (132-166)/(59-87) 166/87 (06/27 0452) SpO2:  [94 %-98 %] 96 % (06/27 0452)  Intake/Output from previous day: 06/26 0701 - 06/27 0700 In: 307.5 [P.O.:120; I.V.:187.5] Out: 200 [Urine:200] Intake/Output this shift: No intake/output data recorded.  Recent Labs    06/07/20 0332  HGB 11.5*   Recent Labs    06/07/20 0332  WBC 12.5*  RBC 3.62*  HCT 34.8*  PLT 183   Recent Labs    06/07/20 0332  NA 132*  K 3.9  CL 102  CO2 23  BUN 15  CREATININE 0.54  GLUCOSE 165*  CALCIUM 8.1*   No results for input(s): LABPT, INR in the last 72 hours.  General: Awake and alert.  Left lower extremity: Sensation intact distally Dorsiflexion/Plantar flexion intact Incision: scant drainage Compartment soft   Assessment/Plan: 2 Days Post-Op Procedure(s) (LRB): LEFT TOTAL KNEE ARTHROPLASTY (Left) Up with therapy  Slow progress with PT requiring 2 plus assistance.She lives at [facility] independent living and will be returning after hospital stay. She will have assistance from workers there at the facility as well as therapy from the Outpatient Rehab department there as well.     [name] 06/08/2020, 8:39 AM

## 2020-06-08 NOTE — Plan of Care (Signed)
  Problem: Clinical Measurements: Goal: Respiratory complications will improve Outcome: Progressing   Problem: Clinical Measurements: Goal: Cardiovascular complication will be avoided Outcome: Progressing   Problem: Activity: Goal: Risk for activity intolerance will decrease Outcome: Progressing   Problem: Coping: Goal: Level of anxiety will decrease Outcome: Progressing   Problem: Elimination: Goal: Will not experience complications related to bowel motility Outcome: Progressing   

## 2020-06-09 ENCOUNTER — Encounter (HOSPITAL_COMMUNITY): Payer: Self-pay | Admitting: Orthopaedic Surgery

## 2020-06-09 LAB — SARS CORONAVIRUS 2 (TAT 6-24 HRS): SARS Coronavirus 2: NEGATIVE

## 2020-06-09 NOTE — NC FL2 (Signed)
[place] LEVEL OF CARE SCREENING TOOL     IDENTIFICATION  Patient Name: [name] Birthdate: February 10, 1941 Sex: female Admission Date (Current Location): 06/06/2020  [facility] and [place] Number:  Herbalist and Address:  [facility],  [place], [place]      Provider Number: [id number]  Attending Physician Name and Address:  [name]  Relative Name and Phone Number:  daughterJackelyn Poling @ [phone]    Current Level of Care: Hospital Recommended Level of Care: [place] Prior Approval Number:    Date Approved/Denied:   PASRR Number: [id number]  Discharge Plan: SNF    Current Diagnoses: Patient Active Problem List   Diagnosis Date Noted  . Status post total replacement of right hip 06/07/2020  . 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/06/2020  . Unilateral primary osteoarthritis, left knee 02/21/2020  . Depression 05/25/2019  . Skin cancer 05/25/2019  . Metatarsalgia of right foot 08/04/2018  . Pulmonary embolus ([place]) 05/19/2018  . Pulmonary embolism ([place]) 05/18/2018  . HTN (hypertension) 05/18/2018  . Lumbar radiculopathy 11/13/2016  . Hallux rigidus of right foot 05/19/2016  . Onychomycosis of left great toe 05/19/2016  . Gait disturbance 04/05/2016  . Lumbar spinal stenosis 02/20/2016  . Claudication ([place]) 02/03/2016  . Ectropion of both eyes 04/07/2015  . 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  . Vitreous syneresis of both eyes 04/07/2015  . Sleep apnea, obstructive 07/29/2014  . Acute laryngitis 07/24/2014  . Allergic rhinitis 07/24/2014  . Hearing loss 07/24/2014  . Hip osteoarthritis 07/24/2014  . Hoarseness 07/24/2014  . Lumbar degenerative disc disease 07/24/2014  . Lumbar spondylosis 07/24/2014  . 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 Urinary incontinence in female 07/24/2014  . Epiphora due to insufficient drainage, bilateral 05/23/2014  . Essential tremor  04/24/2014  . Conjunctivochalasis 03/09/2012  . Dermatochalasis 03/09/2012  . Hyperopia with astigmatism and presbyopia 03/09/2012  . Pinguecula of both eyes 03/09/2012    Orientation RESPIRATION BLADDER Height & Weight     Self, Time, Situation, Place  Normal Continent Weight: 192 lb 14.4 oz (87.5 kg) Height:  4\' 11"  (149.9 cm)  BEHAVIORAL SYMPTOMS/MOOD NEUROLOGICAL BOWEL NUTRITION STATUS      Continent    AMBULATORY STATUS COMMUNICATION OF NEEDS Skin   Extensive Assist Verbally Surgical wounds                       Personal Care Assistance Level of Assistance  Bathing, Dressing Bathing Assistance: Limited assistance   Dressing Assistance: Limited assistance     Functional Limitations Info             SPECIAL CARE FACTORS FREQUENCY  PT (By licensed PT), OT (By licensed OT)     PT Frequency: 5x/wk OT Frequency: 5x/wk            Contractures Contractures Info: Not present    Additional Factors Info  Code Status, Allergies, Psychotropic Code Status Info: Full Allergies Info: see MAR Psychotropic Info: see MAR         Current Medications (06/09/2020):  This is the current hospital active medication list Current Facility-Administered Medications  Medication Dose Route Frequency Provider Last Rate Last Admin  . 0.9 %  sodium chloride infusion   Intravenous Continuous [name], MD   Stopped at 06/07/20 0930  . acetaminophen (TYLENOL) tablet 325-650 mg  325-650 mg Oral Q6H PRN [name], MD   650 mg  at 06/09/20 0946  . alum & mag hydroxide-simeth (MAALOX/MYLANTA) 200-200-20 MG/5ML suspension 30 mL  30 mL Oral Q4H PRN [name], MD      . amLODipine (NORVASC) tablet 2.5 mg  2.5 mg Oral Daily [name], MD   2.5 mg at 06/09/20 0947  . apixaban (ELIQUIS) tablet 5 mg  5 mg Oral BID [name], MD   5 mg at 06/09/20 0947  . busPIRone (BUSPAR) tablet 10 mg  10 mg Oral BID [name],  MD   10 mg at 06/09/20 0946  . cholecalciferol (VITAMIN D3) tablet 1,000 Units  1,000 Units Oral Daily [name], MD   1,000 Units at 06/09/20 [phone]  . diphenhydrAMINE (BENADRYL) 12.5 MG/5ML elixir 12.5-25 mg  12.5-25 mg Oral Q4H PRN [name], MD      . docusate sodium (COLACE) capsule 100 mg  100 mg Oral BID [name], MD   100 mg at 06/09/20 0946  . donepezil (ARICEPT) tablet 10 mg  10 mg Oral QHS [name], MD   10 mg at 06/08/20 2153  . escitalopram (LEXAPRO) tablet 20 mg  20 mg Oral Daily [name], MD   20 mg at 06/09/20 0947  . fesoterodine (TOVIAZ) tablet 8 mg  8 mg Oral QHS [name], MD   8 mg at 06/08/20 2154  . HYDROcodone-acetaminophen (NORCO/VICODIN) 5-325 MG per tablet 1 tablet  1 tablet Oral Q4H PRN [name], MD      . HYDROmorphone (DILAUDID) injection 0.5-1 mg  0.5-1 mg Intravenous Q4H PRN [name], MD   0.5 mg at 06/06/20 2301  . irbesartan (AVAPRO) tablet 150 mg  150 mg Oral Daily [name], MD   150 mg at 06/09/20 0946  . loratadine (CLARITIN) tablet 10 mg  10 mg Oral QHS [name], MD   10 mg at 06/08/20 2153  . menthol-cetylpyridinium (CEPACOL) lozenge 3 mg  1 lozenge Oral PRN [name], MD       Or  . phenol (CHLORASEPTIC) mouth spray 1 spray  1 spray Mouth/Throat PRN [name], MD      . methocarbamol (ROBAXIN) tablet 500 mg  500 mg Oral Q6H PRN [name], MD   500 mg at 06/09/20 0946   Or  . methocarbamol (ROBAXIN) 500 mg in dextrose 5 % 50 mL IVPB  500 mg Intravenous Q6H PRN [name], MD   Stopped at 06/06/20 1636  . metoCLOPramide (REGLAN) tablet 5-10 mg  5-10 mg Oral Q8H PRN [name], MD       Or  . metoCLOPramide (REGLAN) injection 5-10 mg  5-10 mg Intravenous Q8H PRN [name], MD      . mirabegron ER [facility]) tablet 50 mg  50 mg Oral QHS  [name], MD   50 mg at 06/08/20 2154  . nitrofurantoin (MACRODANTIN) capsule 100 mg  100 mg Oral q AM [name], MD   100 mg at 06/09/20 2694  . ondansetron (ZOFRAN) tablet 4 mg  4 mg Oral Q6H PRN [name], MD       Or  . ondansetron [facility]) injection 4 mg  4 mg Intravenous Q6H PRN [name], MD      . pantoprazole (PROTONIX) EC tablet 40 mg  40 mg Oral Daily [name], MD   40 mg at 06/09/20 0947  . polyethylene glycol (  MIRALAX / GLYCOLAX) packet 17 g  17 g Oral Daily PRN [name], MD      . propranolol (INDERAL) tablet 20 mg  20 mg Oral BID [name], MD   20 mg at 06/09/20 0946  . saccharomyces boulardii (FLORASTOR) capsule 250 mg  250 mg Oral BID [name], MD   250 mg at 06/09/20 0946  . topiramate (TOPAMAX) tablet 50 mg  50 mg Oral QHS [name], MD   50 mg at 06/08/20 2153  . vitamin B-12 (CYANOCOBALAMIN) tablet 1,000 mcg  1,000 mcg Oral Daily [name], MD   1,000 mcg at 06/09/20 1308     Discharge Medications: Please see discharge summary for a list of discharge medications.  Relevant Imaging Results:  Relevant Lab Results:   Additional Information SS# [id number]  Lennart Pall, LCSW

## 2020-06-09 NOTE — TOC Initial Note (Signed)
Transition of Care [facility]) - Initial/Assessment Note    Patient Details  Name: [name] MRN: [id number] Date of Birth: Mar 19, 1941  Transition of Care [facility]) CM/SW Contact:    Lennart Pall, LCSW Phone Number: 06/09/2020, 12:04 PM  Clinical Narrative:    Met with pt and daughter to discuss d/c plans.  [place], however, their d/c preference is to go to the SNF level initially for rehab.  Have spoken with the adm coordinator at [facility] who confirms they could have a bed available for pt tomorrow.  Pt and daughter would like to accept this bed offer.  Have alerted MD and Jamse Arn, RN (pt is in [facility]).  Plan to transfer pt tomorrow afternoon.               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[employer])                 DME Arranged: N/A DME Agency: NA       HH Arranged: NA [place] Agency: NA        Prior Living Arrangements/Services Living arrangements for the past 2 months: 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[employer]) Lives with:: Self Patient language and need for interpreter reviewed:: Yes Do you feel safe going back to the place where you live?: Yes      Need for Family Participation in Patient Care: No (Comment) Care giver support system in place?: Yes (comment)   Criminal Activity/Legal Involvement Pertinent to Current Situation/Hospitalization: No - Comment as needed  Activities of Daily Living Home Assistive Devices/Equipment: Environmental consultant (specify type), Wheelchair, Radio producer  (specify quad or straight), Built-in shower seat, Shower chair with back, Grab bars in shower, Eyeglasses, Blood pressure cuff, Hand-held shower hose ADL Screening (condition at time of admission) Patient's cognitive ability adequate to safely complete daily activities?: Yes Is the patient deaf or have difficulty hearing?: No Does the patient have difficulty seeing, even when wearing glasses/contacts?: No Does the patient have difficulty concentrating, remembering, or making decisions?: No Patient able to express need for assistance with ADLs?: Yes Does the patient have difficulty dressing or bathing?: No Independently performs ADLs?: Yes (appropriate for developmental age) Does the patient have difficulty walking or climbing stairs?: No Weakness of Legs: Left Weakness of Arms/Hands: None  Permission Sought/Granted Permission sought to share information with : Facility Sport and exercise psychologist, Family Supports    Share Information with NAME: Claiborne Billings Self  Permission granted to share info w AGENCY: [place]        Emotional Assessment Appearance:: Appears stated age Attitude/Demeanor/Rapport: Gracious Affect (typically observed): Accepting, Pleasant Orientation: : Oriented to Self, Oriented to Place, Oriented to  Time, Oriented to Situation Alcohol / Substance Use: Not Applicable Psych Involvement: No (comment)  Admission diagnosis:  Status post total left knee replacement [Z96.652] Status post total replacement of right hip [Z96.641] Patient Active Problem List   Diagnosis Date Noted  . Status post total replacement of right hip 06/07/2020  . 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/06/2020  . Unilateral  primary osteoarthritis, left knee 02/21/2020  . Depression 05/25/2019  . Skin cancer 05/25/2019  . Metatarsalgia of right foot 08/04/2018  . Pulmonary embolus ([place]) 05/19/2018  . Pulmonary embolism ([place]) 05/18/2018  . HTN (hypertension) 05/18/2018  . Lumbar radiculopathy  11/13/2016  . Hallux rigidus of right foot 05/19/2016  . Onychomycosis of left great toe 05/19/2016  . Gait disturbance 04/05/2016  . Lumbar spinal stenosis 02/20/2016  . Claudication ([place]) 02/03/2016  . Ectropion of both eyes 04/07/2015  . 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  . Vitreous syneresis of both eyes 04/07/2015  . Sleep apnea, obstructive 07/29/2014  . Acute laryngitis 07/24/2014  . Allergic rhinitis 07/24/2014  . Hearing loss 07/24/2014  . Hip osteoarthritis 07/24/2014  . Hoarseness 07/24/2014  . Lumbar degenerative disc disease 07/24/2014  . Lumbar spondylosis 07/24/2014  . 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 Urinary incontinence in female 07/24/2014  . Epiphora due to insufficient drainage, bilateral 05/23/2014  . Essential tremor 04/24/2014  . Conjunctivochalasis 03/09/2012  . Dermatochalasis 03/09/2012  . Hyperopia with astigmatism and presbyopia 03/09/2012  . Pinguecula of both eyes 03/09/2012   PCP:  [name], MD Pharmacy:   [facility] [address], [place] - [address] [place] [address] Phone: [phone] Fax: [phone]     Social Determinants of Health (SDOH) Interventions    Readmission Risk Interventions No flowsheet data found.

## 2020-06-09 NOTE — Plan of Care (Signed)
Plan of care reviewed and discussed with the patient.  Pt quite drowsy and has some difficulty following instructions.

## 2020-06-09 NOTE — Progress Notes (Signed)
Orthopedic Tech Progress Note Patient Details:  [name] [birth year] [id number]  CPM Left Knee CPM Left Knee: On Left Knee Flexion (Degrees): 60 Left Knee Extension (Degrees): 0  Post Interventions Patient Tolerated: Well Instructions Provided: Adjustment of device  [name] 06/09/2020, 3:14 PM

## 2020-06-09 NOTE — Progress Notes (Signed)
Physical Therapy Treatment Patient Details Name: KHALILAH HOKE MRN: [id number] DOB: 23-Jan-1941 Today's Date: 06/09/2020    History of Present Illness s/p L TKA. PMH: bil RCR, back surgery 2013, DVT/PE, tremor    PT Comments    POD # 3 pm session Pt remains groggy/sleepy with only brief moments of arousal.  General transfer comment: pt was unable to rise from recliner so had to use MaxiSky Move to assist out of recliner back to bed.  Positioned to comfort and applied ICE.    Follow Up Recommendations  Follow surgeon's recommendation for DC plan and follow-up therapies     Equipment Recommendations  None recommended by PT    Recommendations for Other Services       Precautions / Restrictions Precautions Precautions: Fall;Knee Restrictions Weight Bearing Restrictions: No Other Position/Activity Restrictions: WBAT    Mobility  Bed Mobility               General bed mobility comments: assisted back to bed  Transfers       General transfer comment: pt was unable to rise from recliner so had to use MaxiSky Move to assist out of recliner back to bed  Ambulation/Gait             General Gait Details: unable to due to groggy state   Stairs             Wheelchair Mobility    Modified Rankin (Stroke Patients Only)       Balance                                            Cognition Arousal/Alertness: Lethargic;Suspect due to medications                                     General Comments: sleepy/groggy      Exercises      General Comments        Pertinent Vitals/Pain Pain Assessment: Faces Faces Pain Scale: Hurts a little bit Pain Location: left knee Pain Descriptors / Indicators: Discomfort;Grimacing Pain Intervention(s): Monitored during session;Repositioned;Ice applied    Home Living                      Prior Function            PT Goals (current goals can now be found in the  care plan section) Progress towards PT goals: Progressing toward goals    Frequency    7X/week      PT Plan Current plan remains appropriate    Co-evaluation              AM-PAC PT "6 Clicks" Mobility   Outcome Measure  Help needed turning from your back to your side while in a flat bed without using bedrails?: Total Help needed moving from lying on your back to sitting on the side of a flat bed without using bedrails?: Total Help needed moving to and from a bed to a chair (including a wheelchair)?: Total Help needed standing up from a chair using your arms (e.g., wheelchair or bedside chair)?: Total Help needed to walk in hospital room?: Total Help needed climbing 3-5 steps with a railing? : Total 6 Click Score: 6    End of Session Equipment Utilized  During Treatment: Gait belt Activity Tolerance: Patient limited by lethargy Patient left: in bed;with bed alarm set;with call bell/phone within reach Nurse Communication: Mobility status;Need for lift equipment PT Visit Diagnosis: Difficulty in walking, not elsewhere classified (R26.2);Muscle weakness (generalized) (M62.81)     Time: 4818-5631 PT Time Calculation (min) (ACUTE ONLY): 20 min  Charges:   $Therapeutic Activity: 8-22 mins                     Rica Koyanagi  PTA Acute  Rehabilitation Services Pager      [phone] Office      [phone]

## 2020-06-09 NOTE — Progress Notes (Signed)
Physical Therapy Treatment Patient Details Name: [name] MRN: [id number] DOB: 1941-03-03 Today's Date: 06/09/2020    History of Present Illness s/p L TKA. PMH: bil RCR, back surgery 2013, DVT/PE, tremor    PT Comments    POD # 3 am session Pt OOB in recliner via nursing staff.  Assisted with sit to stand however unable to complete.  General transfer comment: pt in recliner.  Attempted sit to stand four times + 2 side by side assist.  Pt was unable to clear hips off chair enough to achieve upright posture.  Very groggy with < 15% effort. Remained in recliner and performed TKR TE's mostly AAROM  followed by ICE.   Follow Up Recommendations  Follow surgeon's recommendation for DC plan and follow-up therapies     Equipment Recommendations  None recommended by PT    Recommendations for Other Services       Precautions / Restrictions Precautions Precautions: Fall;Knee Restrictions Weight Bearing Restrictions: No Other Position/Activity Restrictions: WBAT    Mobility  Bed Mobility               General bed mobility comments: OOB in recliner  Transfers Overall transfer level: Needs assistance Equipment used: Rolling walker (2 wheeled) Transfers: Sit to/from Stand Sit to Stand: Max assist;+2 safety/equipment;+2 physical assistance;Total assist         General transfer comment: pt in recliner.  Attempted sit to stand four times + 2 side by side assist.  Pt was unable to clear hips off chair enough to achieve upright posture.  Very groggy with < 15% effort.  Ambulation/Gait             General Gait Details: unable to due to groggy state   Stairs             Wheelchair Mobility    Modified Rankin (Stroke Patients Only)       Balance                                            Cognition Arousal/Alertness: Lethargic;Suspect due to medications                                     General Comments:  sleepy/groggy      Exercises   Total Knee Replacement TE's following HEP handout 10 reps B LE ankle pumps 05 reps towel squeezes 05 reps knee presses 05 reps heel slides  05 reps SAQ's 05 reps SLR's 05 reps ABD Educated on use of gait belt to assist with TE's Followed by ICE     General Comments        Pertinent Vitals/Pain Pain Assessment: Faces Faces Pain Scale: Hurts a little bit Pain Location: left knee Pain Descriptors / Indicators: Grimacing;Sore Pain Intervention(s): Monitored during session;Ice applied;Repositioned    Home Living                      Prior Function            PT Goals (current goals can now be found in the care plan section) Progress towards PT goals: Progressing toward goals    Frequency    7X/week      PT Plan Current plan remains appropriate    Co-evaluation  AM-PAC PT "6 Clicks" Mobility   Outcome Measure  Help needed turning from your back to your side while in a flat bed without using bedrails?: Total Help needed moving from lying on your back to sitting on the side of a flat bed without using bedrails?: Total Help needed moving to and from a bed to a chair (including a wheelchair)?: Total Help needed standing up from a chair using your arms (e.g., wheelchair or bedside chair)?: Total Help needed to walk in hospital room?: Total Help needed climbing 3-5 steps with a railing? : Total 6 Click Score: 6    End of Session Equipment Utilized During Treatment: Gait belt Activity Tolerance: Patient limited by lethargy Patient left: in chair;with call bell/phone within reach   PT Visit Diagnosis: Difficulty in walking, not elsewhere classified (R26.2);Muscle weakness (generalized) (M62.81)     Time: 7902-4097 PT Time Calculation (min) (ACUTE ONLY): 27 min  Charges:  $Therapeutic Exercise: 8-22 mins $Therapeutic Activity: 8-22 mins                     Rica Koyanagi  PTA Acute  Rehabilitation  Services Pager      [phone] Office      [phone]

## 2020-06-09 NOTE — Progress Notes (Signed)
Patient ID: [name], female   DOB: Jan 04, 1941, 79 y.o.   MRN: [id number] The patient's vital signs and labs are all stable.  Her left knee is also stable in her incision looks great.  The dressing is clean and dry.  Her calves are soft and she has good sensation and intact motor function in her feet.  She was quite tired and lethargic yesterday.  This significantly limited her ability to mobilize and participate in therapy.  Given that, she will likely end up needing skilled short-term skilled nursing facility placement.  She was following up commands appropriately this morning but is somewhat confused.  Pain medications have also been limited.  We will certainly need to see how therapy goes today because she is definitely not safe for discharge to her independent living today from a mobility standpoint.

## 2020-06-09 NOTE — Progress Notes (Signed)
Orthopedic Tech Progress Note Patient Details:  [name] 1941-10-02 [id number]  CPM Left Knee CPM Left Knee: Off Left Knee Flexion (Degrees): 60 Left Knee Extension (Degrees): 0  Post Interventions Patient Tolerated: Well Instructions Provided: Adjustment of device  [name] 06/09/2020, 5:31 PM

## 2020-06-10 MED ORDER — APIXABAN 5 MG PO TABS: 5.0000 mg | ORAL_TABLET | Freq: Two times a day (BID) | ORAL | 0 refills | Status: AC

## 2020-06-10 MED ORDER — HYDROCODONE-ACETAMINOPHEN 5-325 MG PO TABS: 1.0000 | ORAL_TABLET | ORAL | 0 refills | Status: DC | PRN

## 2020-06-10 NOTE — Discharge Summary (Signed)
Patient ID: [name] MRN: [id number] DOB/AGE: December 21, 1940 79 y.o.  Admit date: 06/06/2020 Discharge date: 06/10/2020  Admission Diagnoses:  Principal Problem:   Unilateral primary osteoarthritis, left knee Active Problems:   Status post total left knee replacement   Discharge Diagnoses:  Same  Past Medical History:  Diagnosis Date   Arthritis    arthritis in joints and back   Benign familial tremor    Clostridium difficile infection    DDD (degenerative disc disease), lumbar    Diverticulosis    DVT (deep venous thrombosis) ([place])    left leg   Gallstones    GERD (gastroesophageal reflux disease)    Hypertension    Osteoporosis    Wears glasses     Surgeries: Procedure(s): LEFT TOTAL KNEE ARTHROPLASTY on 06/06/2020   Consultants:   Discharged Condition: Improved  Hospital Course: [name] is an 79 y.o. female who was admitted 06/06/2020 for operative treatment ofUnilateral primary osteoarthritis, left knee. Patient has severe unremitting pain that affects sleep, daily activities, and work/hobbies. After pre-op clearance the patient was taken to the operating room on 06/06/2020 and underwent  Procedure(s): LEFT TOTAL KNEE ARTHROPLASTY.    Patient was given perioperative antibiotics:  Anti-infectives (From admission, onward)   Start     Dose/Rate Route Frequency Ordered Stop   06/07/20 0700  nitrofurantoin (MACRODANTIN) capsule 100 mg     Discontinue     100 mg Oral Every morning 06/06/20 1643     06/06/20 1930  ceFAZolin (ANCEF) IVPB 1 g/50 mL premix        1 g 100 mL/hr over 30 Minutes Intravenous Every 6 hours 06/06/20 1643 06/07/20 0315   06/06/20 1000  ceFAZolin (ANCEF) IVPB 2g/100 mL premix        2 g 200 mL/hr over 30 Minutes Intravenous On call to O.R. 06/06/20 7017 06/06/20 1328       Patient was given sequential compression devices, early ambulation, and chemoprophylaxis to prevent DVT.  Patient benefited maximally from hospital  stay and there were no complications.    Recent vital signs:  Patient Vitals for the past 24 hrs:  BP Temp Temp src Pulse Resp SpO2  06/10/20 0550 (!) 144/67 98.3 F (36.8 C) Oral 67 18 97 %  06/09/20 2002 (!) 139/53 97.9 F (36.6 C) Oral 67 18 95 %  06/09/20 1501 (!) 144/74 98.3 F (36.8 C) -- 65 14 96 %     Recent laboratory studies: No results for input(s): WBC, HGB, HCT, PLT, NA, K, CL, CO2, BUN, CREATININE, GLUCOSE, INR, CALCIUM in the last 72 hours.  Invalid input(s): PT, 2   Discharge Medications:   Allergies as of 06/10/2020      Reactions   Buprenorphine Hcl Nausea And Vomiting   Codeine Nausea And Vomiting   Other reaction(s): Other (See Comments) But tolerates Tramadol But tolerates Tramadol Vomiting   Morphine And Related Nausea And Vomiting   Morphine    Other reaction(s): Other (See Comments) Vomiting   Procaine    Other reaction(s): Other (See Comments) Other reaction(s): Shakes Novacaine-Shakes      Medication List    TAKE these medications   acetaminophen 500 MG tablet Commonly known as: TYLENOL Take 500 mg by mouth every 6 (six) hours as needed (for pain.).   amLODipine 2.5 MG tablet Commonly known as: NORVASC Take 2.5 mg by mouth daily.   apixaban 5 MG Tabs tablet Commonly known as: ELIQUIS Take 1 tablet (5 mg total) by mouth  2 (two) times daily. What changed:   how much to take  how to take this  when to take this  additional instructions   busPIRone 10 MG tablet Commonly known as: BUSPAR Take 10 mg by mouth 2 (two) times daily.   cholecalciferol 25 MCG (1000 UNIT) tablet Commonly known as: VITAMIN D Take 1,000 Units by mouth daily.   donepezil 10 MG tablet Commonly known as: ARICEPT Take 10 mg by mouth at bedtime.   escitalopram 20 MG tablet Commonly known as: LEXAPRO Take 20 mg by mouth daily.   esomeprazole 40 MG capsule Commonly known as: NEXIUM Take 40 mg by mouth 2 (two) times daily before a meal.    HYDROcodone-acetaminophen 5-325 MG tablet Commonly known as: NORCO/VICODIN Take 1 tablet by mouth every 4 (four) hours as needed for moderate pain or severe pain (give every 4-6 hours as needed for pain).   loratadine 10 MG tablet Commonly known as: CLARITIN Take 10 mg by mouth at bedtime.   Myrbetriq 50 MG Tb24 tablet Generic drug: mirabegron ER Take 50 mg by mouth at bedtime.   nitrofurantoin 100 MG capsule Commonly known as: MACRODANTIN Take 100 mg by mouth in the morning.   olmesartan 20 MG tablet Commonly known as: BENICAR Take 20 mg by mouth in the morning and at bedtime.   propranolol 20 MG tablet Commonly known as: INDERAL TAKE ONE TABLET BY MOUTH TWICE A DAY What changed: when to take this   saccharomyces boulardii 250 MG capsule Commonly known as: FLORASTOR Take 250 mg by mouth 2 (two) times daily.   topiramate 50 MG tablet Commonly known as: Topamax 1/2 tab for 1 week, then 1 tab daily What changed:   how much to take  how to take this  when to take this  additional instructions   Toviaz 8 MG Tb24 tablet Generic drug: fesoterodine Take 8 mg by mouth at bedtime.   vitamin B-12 1000 MCG tablet Commonly known as: CYANOCOBALAMIN Take 1,000 mcg by mouth daily.            Durable Medical Equipment  (From admission, onward)         Start     Ordered   06/06/20 1644  DME 3 n 1  Once        06/06/20 1643   06/06/20 1644  DME Walker rolling  Once       Question Answer Comment  Walker: With 5 Inch Wheels   Patient needs a walker to treat with the following condition Status post total left knee replacement      06/06/20 1643          Diagnostic Studies: DG Knee Left Port  Result Date: 06/06/2020 CLINICAL DATA:  Status post left total knee arthroplasty. EXAM: PORTABLE LEFT KNEE - 1-2 VIEW COMPARISON:  February 06, 2020. FINDINGS: The left femoral and tibial components appear to be well situated. Expected postoperative changes are noted in  the soft tissues anteriorly. IMPRESSION: Status post left total knee arthroplasty. Electronically Signed   By: Marijo Conception M.D.   On: 06/06/2020 16:28    Disposition: Discharge disposition: 03-Skilled [place] information for follow-up providers    [name], MD. Go on 06/23/2020.   Specialty: Orthopedic Surgery Why: at 2:00 pm for a 2 week post-op visit in office with Dr. Clarita Leber information: [place] [address] [phone]  Contact information for after-discharge care    Destination    [facility] Preferred SNF .   Service: Skilled Nursing Contact information: [address] [phone]                   Signed: [name] 06/10/2020, 12:15 PM

## 2020-06-10 NOTE — Plan of Care (Signed)
Plan of care reviewed and discussed with the patient. 

## 2020-06-10 NOTE — Discharge Instructions (Signed)

## 2020-06-10 NOTE — Progress Notes (Signed)
Orthopedic Tech Progress Note Patient Details:  [name] 10-04-1941 [id number]  CPM Left Knee CPM Left Knee: On Left Knee Flexion (Degrees): 60 Left Knee Extension (Degrees): 0  Post Interventions Patient Tolerated: Well Instructions Provided: Adjustment of device  [name] 06/10/2020, 11:41 AM

## 2020-06-10 NOTE — TOC Transition Note (Signed)
Transition of Care [facility]) - CM/SW Discharge Note   Patient Details  Name: [name] MRN: [id number] Date of Birth: 09-23-1941  [facility] CM/SW Contact:  Lennart Pall, LCSW Phone Number: 06/10/2020, 1:11 PM   Clinical Narrative:   Pt ready for d/c to SNF at [facility].  PTAR arranged and pt/family aware.  No further TOC needs.    Final next level of care: [place] Barriers to Discharge: Barriers Resolved   Patient Goals and CMS Choice Patient states their goals for this hospitalization and ongoing recovery are:: hopes to dc to SNF level of care at [facility]      Discharge Placement              Patient chooses bed at: [facility] Patient to be transferred to facility by: [place] Name of family member notified: daughter, Alyce Patient and family notified of of transfer: 06/10/20  Discharge Plan and Services In-house Referral: Clinical Social Work              DME Arranged: N/A DME Agency: NA       HH Arranged: NA HH Agency: NA        Social Determinants of Health (SDOH) Interventions     Readmission Risk Interventions No flowsheet data found.

## 2020-06-10 NOTE — Discharge Summary (Signed)
Patient ID: LAURIA DEPOY MRN: [id number] DOB/AGE: 1941-03-24 79 y.o.  Admit date: 06/06/2020 Discharge date: 06/10/2020  Admission Diagnoses:  Principal Problem:   Unilateral primary osteoarthritis, left knee Active Problems:   Status post total left knee replacement   Status post total replacement of right hip   Discharge Diagnoses:  Same  Past Medical History:  Diagnosis Date  . 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 Benign familial tremor   . Clostridium difficile infection   . DDD (degenerative disc disease), lumbar   . Diverticulosis   . DVT (deep venous thrombosis) ([facility])    left leg  . Gallstones   . GERD (gastroesophageal reflux disease)   . Hypertension   . Osteoporosis   . Wears glasses     Surgeries: Procedure(s): LEFT TOTAL KNEE ARTHROPLASTY on 06/06/2020   Consultants:   Discharged Condition: Improved  Hospital Course: CARLETHIA MESQUITA is an 79 y.o. female who was admitted 06/06/2020 for operative treatment ofUnilateral primary osteoarthritis, left knee. Patient has severe unremitting pain that affects sleep, daily activities, and work/hobbies. After pre-op clearance the patient was taken to the operating room on 06/06/2020 and underwent  Procedure(s): LEFT TOTAL KNEE ARTHROPLASTY.    Patient was given perioperative antibiotics:  Anti-infectives (From admission, onward)   Start     Dose/Rate Route Frequency Ordered Stop   06/07/20 0700  nitrofurantoin (MACRODANTIN) capsule 100 mg     Discontinue     100 mg Oral Every morning 06/06/20 1643     06/06/20 1930  ceFAZolin (ANCEF) IVPB 1 g/50 mL premix        1 g 100 mL/hr over 30 Minutes Intravenous Every 6 hours 06/06/20 1643 06/07/20 0315   06/06/20 1000  ceFAZolin (ANCEF) IVPB 2g/100 mL premix        2 g 200 mL/hr over 30 Minutes Intravenous On call to O.R. 06/06/20 4034 06/06/20 1328       Patient was given sequential compression devices, early ambulation, and chemoprophylaxis to prevent  DVT.  Patient benefited maximally from hospital stay and there were no complications.    Recent vital signs:  Patient Vitals for the past 24 hrs:  BP Temp Temp src Pulse Resp SpO2  06/10/20 0550 (!) 144/67 98.3 F (36.8 C) Oral 67 18 97 %  06/09/20 2002 (!) 139/53 97.9 F (36.6 C) Oral 67 18 95 %  06/09/20 1501 (!) 144/74 98.3 F (36.8 C) -- 65 14 96 %     Recent laboratory studies: No results for input(s): WBC, HGB, HCT, PLT, NA, K, CL, CO2, BUN, CREATININE, GLUCOSE, INR, CALCIUM in the last 72 hours.  Invalid input(s): PT, 2   Discharge Medications:   Allergies as of 06/10/2020      Reactions   Buprenorphine Hcl Nausea And Vomiting   Codeine Nausea And Vomiting   Other reaction(s): Other (See Comments) But tolerates Tramadol But tolerates Tramadol Vomiting   Morphine And Related Nausea And Vomiting   Morphine    Other reaction(s): Other (See Comments) Vomiting   Procaine    Other reaction(s): Other (See Comments) Other reaction(s): Shakes Novacaine-Shakes      Medication List    TAKE these medications   acetaminophen 500 MG tablet Commonly known as: TYLENOL Take 500 mg by mouth every 6 (six) hours as needed (for pain.).   amLODipine 2.5 MG tablet Commonly known as: NORVASC Take 2.5 mg by mouth daily.   apixaban 5 MG Tabs tablet Commonly known as:  ELIQUIS Take 10 mg (Two 5 mg Tablets) po BID for 7 days and then Take 5 mg po BID onwards   busPIRone 10 MG tablet Commonly known as: BUSPAR Take 10 mg by mouth 2 (two) times daily.   cholecalciferol 25 MCG (1000 UNIT) tablet Commonly known as: VITAMIN D Take 1,000 Units by mouth daily.   donepezil 10 MG tablet Commonly known as: ARICEPT Take 10 mg by mouth at bedtime.   escitalopram 20 MG tablet Commonly known as: LEXAPRO Take 20 mg by mouth daily.   esomeprazole 40 MG capsule Commonly known as: NEXIUM Take 40 mg by mouth 2 (two) times daily before a meal.   HYDROcodone-acetaminophen 5-325 MG  tablet Commonly known as: NORCO/VICODIN Take 1 tablet by mouth every 4 (four) hours as needed for moderate pain or severe pain (give every 4-6 hours as needed for pain).   loratadine 10 MG tablet Commonly known as: CLARITIN Take 10 mg by mouth at bedtime.   Myrbetriq 50 MG Tb24 tablet Generic drug: mirabegron ER Take 50 mg by mouth at bedtime.   nitrofurantoin 100 MG capsule Commonly known as: MACRODANTIN Take 100 mg by mouth in the morning.   olmesartan 20 MG tablet Commonly known as: BENICAR Take 20 mg by mouth in the morning and at bedtime.   propranolol 20 MG tablet Commonly known as: INDERAL TAKE ONE TABLET BY MOUTH TWICE A DAY What changed: when to take this   saccharomyces boulardii 250 MG capsule Commonly known as: FLORASTOR Take 250 mg by mouth 2 (two) times daily.   topiramate 50 MG tablet Commonly known as: Topamax 1/2 tab for 1 week, then 1 tab daily What changed:   how much to take  how to take this  when to take this  additional instructions   Toviaz 8 MG Tb24 tablet Generic drug: fesoterodine Take 8 mg by mouth at bedtime.   vitamin B-12 1000 MCG tablet Commonly known as: CYANOCOBALAMIN Take 1,000 mcg by mouth daily.            Durable Medical Equipment  (From admission, onward)         Start     Ordered   06/06/20 1644  DME 3 n 1  Once        06/06/20 1643   06/06/20 1644  DME Walker rolling  Once       Question Answer Comment  Walker: With 5 Inch Wheels   Patient needs a walker to treat with the following condition Status post total left knee replacement      06/06/20 1643          Diagnostic Studies: DG Knee Left Port  Result Date: 06/06/2020 CLINICAL DATA:  Status post left total knee arthroplasty. EXAM: PORTABLE LEFT KNEE - 1-2 VIEW COMPARISON:  February 06, 2020. FINDINGS: The left femoral and tibial components appear to be well situated. Expected postoperative changes are noted in the soft tissues anteriorly.  IMPRESSION: Status post left total knee arthroplasty. Electronically Signed   By: Marijo Conception M.D.   On: 06/06/2020 16:28    Disposition: Discharge disposition: 03-Skilled [place]    [name], MD. Go on 06/23/2020.   Specialty: Orthopedic Surgery Why: at 2:00 pm for a 2 week post-op visit in office with Dr. Clarita Leber information: [place] [address] [phone]  Signed: [name] 06/10/2020, 7:17 AM

## 2020-06-10 NOTE — Discharge Summary (Signed)
Patient ID: [name] MRN: [id number] DOB/AGE: November 21, 1941 79 y.o.  Admit date: 06/06/2020 Discharge date: 06/10/2020  Admission Diagnoses:  Principal Problem:   Unilateral primary osteoarthritis, left knee Active Problems:   Status post total left knee replacement   Discharge Diagnoses:  Same  Past Medical History:  Diagnosis Date  . 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 Benign familial tremor   . Clostridium difficile infection   . DDD (degenerative disc disease), lumbar   . Diverticulosis   . DVT (deep venous thrombosis) ([facility])    left leg  . Gallstones   . GERD (gastroesophageal reflux disease)   . Hypertension   . Osteoporosis   . Wears glasses     Surgeries: Procedure(s): LEFT TOTAL KNEE ARTHROPLASTY on 06/06/2020   Consultants:   Discharged Condition: Improved  Hospital Course: [name] is an 79 y.o. female who was admitted 06/06/2020 for operative treatment ofUnilateral primary osteoarthritis, left knee. Patient has severe unremitting pain that affects sleep, daily activities, and work/hobbies. After pre-op clearance the patient was taken to the operating room on 06/06/2020 and underwent  Procedure(s): LEFT TOTAL KNEE ARTHROPLASTY.    Patient was given perioperative antibiotics:  Anti-infectives (From admission, onward)   Start     Dose/Rate Route Frequency Ordered Stop   06/07/20 0700  nitrofurantoin (MACRODANTIN) capsule 100 mg     Discontinue     100 mg Oral Every morning 06/06/20 1643     06/06/20 1930  ceFAZolin (ANCEF) IVPB 1 g/50 mL premix        1 g 100 mL/hr over 30 Minutes Intravenous Every 6 hours 06/06/20 1643 06/07/20 0315   06/06/20 1000  ceFAZolin (ANCEF) IVPB 2g/100 mL premix        2 g 200 mL/hr over 30 Minutes Intravenous On call to O.R. 06/06/20 4709 06/06/20 1328       Patient was given sequential compression devices, early ambulation, and chemoprophylaxis to prevent DVT.  Patient benefited maximally from hospital  stay and there were no complications.    Recent vital signs:  Patient Vitals for the past 24 hrs:  BP Temp Temp src Pulse Resp SpO2  06/10/20 0550 (!) 144/67 98.3 F (36.8 C) Oral 67 18 97 %  06/09/20 2002 (!) 139/53 97.9 F (36.6 C) Oral 67 18 95 %  06/09/20 1501 (!) 144/74 98.3 F (36.8 C) -- 65 14 96 %     Recent laboratory studies: No results for input(s): WBC, HGB, HCT, PLT, NA, K, CL, CO2, BUN, CREATININE, GLUCOSE, INR, CALCIUM in the last 72 hours.  Invalid input(s): PT, 2   Discharge Medications:   Allergies as of 06/10/2020      Reactions   Buprenorphine Hcl Nausea And Vomiting   Codeine Nausea And Vomiting   Other reaction(s): Other (See Comments) But tolerates Tramadol But tolerates Tramadol Vomiting   Morphine And Related Nausea And Vomiting   Morphine    Other reaction(s): Other (See Comments) Vomiting   Procaine    Other reaction(s): Other (See Comments) Other reaction(s): Shakes Novacaine-Shakes      Medication List    TAKE these medications   acetaminophen 500 MG tablet Commonly known as: TYLENOL Take 500 mg by mouth every 6 (six) hours as needed (for pain.).   amLODipine 2.5 MG tablet Commonly known as: NORVASC Take 2.5 mg by mouth daily.   apixaban 5 MG Tabs tablet Commonly known as: ELIQUIS Take 1 tablet (5 mg total) by mouth  2 (two) times daily. What changed:   how much to take  how to take this  when to take this  additional instructions   busPIRone 10 MG tablet Commonly known as: BUSPAR Take 10 mg by mouth 2 (two) times daily.   cholecalciferol 25 MCG (1000 UNIT) tablet Commonly known as: VITAMIN D Take 1,000 Units by mouth daily.   donepezil 10 MG tablet Commonly known as: ARICEPT Take 10 mg by mouth at bedtime.   escitalopram 20 MG tablet Commonly known as: LEXAPRO Take 20 mg by mouth daily.   esomeprazole 40 MG capsule Commonly known as: NEXIUM Take 40 mg by mouth 2 (two) times daily before a meal.    HYDROcodone-acetaminophen 5-325 MG tablet Commonly known as: NORCO/VICODIN Take 1 tablet by mouth every 4 (four) hours as needed for moderate pain or severe pain (give every 4-6 hours as needed for pain).   loratadine 10 MG tablet Commonly known as: CLARITIN Take 10 mg by mouth at bedtime.   Myrbetriq 50 MG Tb24 tablet Generic drug: mirabegron ER Take 50 mg by mouth at bedtime.   nitrofurantoin 100 MG capsule Commonly known as: MACRODANTIN Take 100 mg by mouth in the morning.   olmesartan 20 MG tablet Commonly known as: BENICAR Take 20 mg by mouth in the morning and at bedtime.   propranolol 20 MG tablet Commonly known as: INDERAL TAKE ONE TABLET BY MOUTH TWICE A DAY What changed: when to take this   saccharomyces boulardii 250 MG capsule Commonly known as: FLORASTOR Take 250 mg by mouth 2 (two) times daily.   topiramate 50 MG tablet Commonly known as: Topamax 1/2 tab for 1 week, then 1 tab daily What changed:   how much to take  how to take this  when to take this  additional instructions   Toviaz 8 MG Tb24 tablet Generic drug: fesoterodine Take 8 mg by mouth at bedtime.   vitamin B-12 1000 MCG tablet Commonly known as: CYANOCOBALAMIN Take 1,000 mcg by mouth daily.            Durable Medical Equipment  (From admission, onward)         Start     Ordered   06/06/20 1644  DME 3 n 1  Once        06/06/20 1643   06/06/20 1644  DME Walker rolling  Once       Question Answer Comment  Walker: With 5 Inch Wheels   Patient needs a walker to treat with the following condition Status post total left knee replacement      06/06/20 1643          Diagnostic Studies: DG Knee Left Port  Result Date: 06/06/2020 CLINICAL DATA:  Status post left total knee arthroplasty. EXAM: PORTABLE LEFT KNEE - 1-2 VIEW COMPARISON:  February 06, 2020. FINDINGS: The left femoral and tibial components appear to be well situated. Expected postoperative changes are noted in  the soft tissues anteriorly. IMPRESSION: Status post left total knee arthroplasty. Electronically Signed   By: Marijo Conception M.D.   On: 06/06/2020 16:28    Disposition: Discharge disposition: 03-Skilled [place] information for follow-up providers    [name], MD. Go on 06/23/2020.   Specialty: Orthopedic Surgery Why: at 2:00 pm for a 2 week post-op visit in office with Dr. Clarita Leber information: [place] [address] [phone]  Contact information for after-discharge care    Destination    [facility] Preferred [facility] .   Service: Skilled Nursing Contact information: [address] [phone]                   Signed: [name] 06/10/2020, 10:02 AM

## 2020-06-10 NOTE — Progress Notes (Signed)
Orthopedic Tech Progress Note Patient Details:  [name] 03-13-1941 [id number]  CPM Left Knee CPM Left Knee: Off Left Knee Flexion (Degrees): 60 Left Knee Extension (Degrees): 0  Post Interventions Patient Tolerated: Well Instructions Provided: Adjustment of device  [name] 06/10/2020, 12:21 PM

## 2020-06-10 NOTE — Progress Notes (Signed)
Patient ID: [name], female   DOB: 1941-02-20, 79 y.o.   MRN: [id number] There is been no acute changes in the patient's medical status.  Her left operative knee is stable and her vitals are stable.  The plan is to send her to short-term skilled nursing today for continued rehabilitation.

## 2020-06-10 NOTE — Care Management Important Message (Signed)
Important Message  Patient Details IM Letter given to [place] Case Manager to present to the Patient Name: [name] MRN: [id number] Date of Birth: 25-Jan-1941   Medicare Important Message Given:  Yes     Kerin Salen 06/10/2020, 10:22 AM

## 2020-06-11 ENCOUNTER — Telehealth: Payer: Self-pay | Admitting: *Deleted

## 2020-06-11 NOTE — Telephone Encounter (Signed)
Ortho bundle D/C call to patient.  

## 2020-06-11 NOTE — Care Plan (Signed)
RNCM call to patient who was discharged yesterday to [facility] facility (SNF) for continued care and rehab after her Left TKA done on 06/06/20. She was extremely groggy while in the hospital after surgery and was very limited in therapy due to this. She was planning on returning to her Independent living apartment with assistance from staff if needed, but it was determined she would need more care than could be provided safely. CM spoke with Arminda Resides, Rehab director and updated. A bed became available on 06/10/20 and she was discharged to the SNF portion of [place]. Call today and patient answered her room phone. She was extremely groggy, but did inform she had therapy today and had no current needs from CM. Will continue to follow for needs.

## 2020-06-13 ENCOUNTER — Telehealth: Payer: Self-pay | Admitting: *Deleted

## 2020-06-13 DIAGNOSIS — N3281 Overactive bladder: Secondary | ICD-10-CM | POA: Diagnosis not present

## 2020-06-13 DIAGNOSIS — E559 Vitamin D deficiency, unspecified: Secondary | ICD-10-CM | POA: Diagnosis not present

## 2020-06-13 DIAGNOSIS — Z96652 Presence of left artificial knee joint: Secondary | ICD-10-CM | POA: Diagnosis not present

## 2020-06-13 DIAGNOSIS — K219 Gastro-esophageal reflux disease without esophagitis: Secondary | ICD-10-CM | POA: Diagnosis not present

## 2020-06-13 DIAGNOSIS — F339 Major depressive disorder, recurrent, unspecified: Secondary | ICD-10-CM | POA: Diagnosis not present

## 2020-06-13 DIAGNOSIS — I482 Chronic atrial fibrillation, unspecified: Secondary | ICD-10-CM | POA: Diagnosis not present

## 2020-06-13 DIAGNOSIS — I10 Essential (primary) hypertension: Secondary | ICD-10-CM | POA: Diagnosis not present

## 2020-06-13 DIAGNOSIS — M6281 Muscle weakness (generalized): Secondary | ICD-10-CM | POA: Diagnosis not present

## 2020-06-13 NOTE — Care Plan (Signed)
Patient called for 1 week post op call. She remains in [facility] facility (SNF). She verbalized she is in pain, but is trying to work hard. She remains very groggy and sleepy she states. Anticipate stay longer than previously expected. Continues to receive therapy there and will transition over to Independent living with OPPT there at [facility] when ready. Otherwise, no needs at this time.

## 2020-06-13 NOTE — Telephone Encounter (Signed)
7 day Ortho bundle call completed. 

## 2020-06-20 DIAGNOSIS — M6281 Muscle weakness (generalized): Secondary | ICD-10-CM | POA: Diagnosis not present

## 2020-06-20 DIAGNOSIS — M25562 Pain in left knee: Secondary | ICD-10-CM | POA: Diagnosis not present

## 2020-06-20 DIAGNOSIS — N3281 Overactive bladder: Secondary | ICD-10-CM | POA: Diagnosis not present

## 2020-06-20 DIAGNOSIS — Z96652 Presence of left artificial knee joint: Secondary | ICD-10-CM | POA: Diagnosis not present

## 2020-06-20 DIAGNOSIS — E559 Vitamin D deficiency, unspecified: Secondary | ICD-10-CM | POA: Diagnosis not present

## 2020-06-20 DIAGNOSIS — F339 Major depressive disorder, recurrent, unspecified: Secondary | ICD-10-CM | POA: Diagnosis not present

## 2020-06-20 DIAGNOSIS — R05 Cough: Secondary | ICD-10-CM | POA: Diagnosis not present

## 2020-06-20 DIAGNOSIS — I10 Essential (primary) hypertension: Secondary | ICD-10-CM | POA: Diagnosis not present

## 2020-06-20 DIAGNOSIS — I482 Chronic atrial fibrillation, unspecified: Secondary | ICD-10-CM | POA: Diagnosis not present

## 2020-06-20 DIAGNOSIS — F0390 Unspecified dementia without behavioral disturbance: Secondary | ICD-10-CM | POA: Diagnosis not present

## 2020-06-20 DIAGNOSIS — K219 Gastro-esophageal reflux disease without esophagitis: Secondary | ICD-10-CM | POA: Diagnosis not present

## 2020-06-23 ENCOUNTER — Encounter: Payer: Self-pay | Admitting: Orthopaedic Surgery

## 2020-06-23 ENCOUNTER — Telehealth: Payer: Self-pay | Admitting: *Deleted

## 2020-06-23 ENCOUNTER — Ambulatory Visit (INDEPENDENT_AMBULATORY_CARE_PROVIDER_SITE_OTHER): Payer: Medicare Other | Admitting: Orthopaedic Surgery

## 2020-06-23 ENCOUNTER — Other Ambulatory Visit: Payer: Self-pay

## 2020-06-23 DIAGNOSIS — F0390 Unspecified dementia without behavioral disturbance: Secondary | ICD-10-CM | POA: Diagnosis not present

## 2020-06-23 DIAGNOSIS — E559 Vitamin D deficiency, unspecified: Secondary | ICD-10-CM | POA: Diagnosis not present

## 2020-06-23 DIAGNOSIS — M25562 Pain in left knee: Secondary | ICD-10-CM | POA: Diagnosis not present

## 2020-06-23 DIAGNOSIS — N3281 Overactive bladder: Secondary | ICD-10-CM | POA: Diagnosis not present

## 2020-06-23 DIAGNOSIS — I482 Chronic atrial fibrillation, unspecified: Secondary | ICD-10-CM | POA: Diagnosis not present

## 2020-06-23 DIAGNOSIS — F339 Major depressive disorder, recurrent, unspecified: Secondary | ICD-10-CM | POA: Diagnosis not present

## 2020-06-23 DIAGNOSIS — Z96652 Presence of left artificial knee joint: Secondary | ICD-10-CM

## 2020-06-23 DIAGNOSIS — K219 Gastro-esophageal reflux disease without esophagitis: Secondary | ICD-10-CM | POA: Diagnosis not present

## 2020-06-23 DIAGNOSIS — I10 Essential (primary) hypertension: Secondary | ICD-10-CM | POA: Diagnosis not present

## 2020-06-23 NOTE — Progress Notes (Signed)
The patient is just over 2 weeks status post a left total knee arthroplasty.  She is 80 years old.  She did have a tough postoperative course in terms of the pain that she is having from surgery and difficulty with mobility.  This warranted her transitioning from the acute hospital stay to short-term skilled nursing for further intensive rehabilitation of the left knee postoperatively.  She is making good progress in skilled nursing but they need to keep her little bit longer according to their notes and to Burt Knack case manager due to balance and coordination issues.  On examination her staples been removed from the left knee and Steri-Strips placed.  Her calf is soft.  She has almost full extension in the flexion past 100 degrees.  I did let her know that we are proud of how she is getting her motion back but she needs to be patient in terms of her balance and coordination.  They will transition her to independent living when everyone feels she is comfortable to be on her own.  I would like to see her back in 4 weeks to see how she is doing overall.  All question concerns were answered and addressed.

## 2020-06-23 NOTE — Care Plan (Signed)
RNCM met with patient and her daughter in office today during her 2 week post-op appointment. She remains in [facility] currently as she was unable to return home after discharge from the hospital back to [facility] independent living area as originally planned. She indicated that she felt she was doing well. Her daughter had reached out to [facility] earlier in the day before the appointment and it was determined by therapy at the SNF that she still needed some extra time for continued rehab prior to return ing home. Discussed this with Dr. Ninfa Linden and the patient. She is hesitant but agreeable to stay at SNF for a few extra days to get more rehab to be able to care for herself. CM after the visit called to the Rehab coordinator as well as patient's SW at [facility] and updated them on the appointment today. Anticipate possible d/c back to her apartment there at [facility] Friday, 06/27/20.

## 2020-06-23 NOTE — Telephone Encounter (Signed)
Ortho bundle 14 day call completed. 

## 2020-06-24 NOTE — Progress Notes (Deleted)
Virtual Visit Via Video   The purpose of this virtual visit is to provide medical care while limiting exposure to the novel coronavirus.    Consent was obtained for video visit:  {yes no:314532} Answered questions that patient had about telehealth interaction:  {yes no:314532} I discussed the limitations, risks, security and privacy concerns of performing an evaluation and management service by telemedicine. I also discussed with the patient that there may be a patient responsible charge related to this service. The patient expressed understanding and agreed to proceed.  Pt location: Home Physician Location: office Name of referring provider:  [name], MD I connected with Pia Mau at patients initiation/request on 06/26/2020 at  1:00 PM EDT by video enabled telemedicine application and verified that I am speaking with the correct person using two identifiers. Pt MRN:  [id number] Pt DOB:  Dec 13, 1941 Video Participants:  Pia Mau;  ***  Assessment/Plan:   ***1.  Essential tremor  -Continue propranolol, 20 mg twice per day  -Not candidate for primidone due to Eliquis, which is planned for lifelong therapy per Dr. Alvy Bimler  -Continue topiramate 2.  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  This had not changed since 2009.  We discussed whether or not to repeat this and patient states that *** 3.  Recurrent DVT/PE  -On Eliquis.  Follows with hematology  Subjective   ***Patient seen today in follow-up for essential tremor.  Medical records are reviewed since last visit.  We cautiously started Topamax to see if that would help tremor at all.  She reports that ***she had a total knee replacement on the left since last visit.  Records reviewed.  Current movement d/o meds:  ***Propranolol, 20 mg twice per day Topiramate, 50 mg daily (started last visit)   Current Outpatient  Medications on File Prior to Visit  Medication Sig Dispense Refill  . acetaminophen (TYLENOL) 500 MG tablet Take 500 mg by mouth every 6 (six) hours as needed (for pain.).     Marland Kitchen amLODipine (NORVASC) 2.5 MG tablet Take 2.5 mg by mouth daily.     Marland Kitchen apixaban (ELIQUIS) 5 MG TABS tablet Take 1 tablet (5 mg total) by mouth 2 (two) times daily. 60 tablet 0  . busPIRone (BUSPAR) 10 MG tablet Take 10 mg by mouth 2 (two) times daily.     . cholecalciferol (VITAMIN D) 25 MCG (1000 UNIT) tablet Take 1,000 Units by mouth daily.    Marland Kitchen donepezil (ARICEPT) 10 MG tablet Take 10 mg by mouth at bedtime.    Marland Kitchen escitalopram (LEXAPRO) 20 MG tablet Take 20 mg by mouth daily.     Marland Kitchen esomeprazole (NEXIUM) 40 MG capsule Take 40 mg by mouth 2 (two) times daily before a meal.     . HYDROcodone-acetaminophen (NORCO/VICODIN) 5-325 MG tablet Take 1 tablet by mouth every 4 (four) hours as needed for moderate pain or severe pain (give every 4-6 hours as needed for pain). 30 tablet 0  . loratadine (CLARITIN) 10 MG tablet Take 10 mg by mouth at bedtime.    . mirabegron ER (MYRBETRIQ) 50 MG TB24 tablet Take 50 mg by mouth at bedtime.     . nitrofurantoin (MACRODANTIN) 100 MG capsule Take 100 mg by mouth in the morning.     . olmesartan (BENICAR) 20 MG tablet Take 20 mg by mouth in the morning and at bedtime.     Marland Kitchen  propranolol (INDERAL) 20 MG tablet TAKE ONE TABLET BY MOUTH TWICE A DAY (Patient taking differently: Take 20 mg by mouth in the morning and at bedtime. ) 180 tablet 4  . saccharomyces boulardii (FLORASTOR) 250 MG capsule Take 250 mg by mouth 2 (two) times daily.    Marland Kitchen topiramate (TOPAMAX) 50 MG tablet 1/2 tab for 1 week, then 1 tab daily (Patient taking differently: Take 50 mg by mouth at bedtime. ) 90 tablet 1  . TOVIAZ 8 MG TB24 tablet Take 8 mg by mouth at bedtime.     . vitamin B-12 (CYANOCOBALAMIN) 1000 MCG tablet Take 1,000 mcg by mouth daily.    . [DISCONTINUED] carvedilol (COREG) 6.25 MG tablet      No current  facility-administered medications on file prior to visit.     Objective   There were no vitals filed for this visit. GEN:  The patient appears stated age and is in NAD.  Neurological examination:  Orientation: The patient is alert and oriented x3. Cranial nerves: There is good facial symmetry. There is ***facial hypomimia.  The speech is fluent and clear. Soft palate rises symmetrically and there is no tongue deviation. Hearing is intact to conversational tone. Motor: Strength is at least antigravity x 4.   Shoulder shrug is equal and symmetric.  There is no pronator drift.  Movement examination: Tone: unable Abnormal movements: *** Coordination:  There is *** decremation with RAM's, *** Gait and Station: The patient has *** difficulty arising out of a deep-seated chair without the use of the hands. The patient's stride length is ***.      Follow up Instructions      -I discussed the assessment and treatment plan with the patient. The patient was provided an opportunity to ask questions and all were answered. The patient agreed with the plan and demonstrated an understanding of the instructions.   The patient was advised to call back or seek an in-person evaluation if the symptoms worsen or if the condition fails to improve as anticipated.    Total time spent on today's visit was ***minutes, including both face-to-face time and nonface-to-face time.  Time included that spent on review of records (prior notes available to me/labs/imaging if pertinent), discussing treatment and goals, answering patient's questions and coordinating care.   Alonza Bogus, DO

## 2020-06-26 ENCOUNTER — Other Ambulatory Visit: Payer: Self-pay

## 2020-06-26 ENCOUNTER — Telehealth: Payer: Medicare Other | Admitting: Neurology

## 2020-06-27 ENCOUNTER — Ambulatory Visit: Payer: Medicare Other | Admitting: Podiatry

## 2020-07-01 ENCOUNTER — Telehealth: Payer: Self-pay

## 2020-07-01 NOTE — Telephone Encounter (Signed)
Patient wanting to speak about pain medication . Says she has question about certain pain meds

## 2020-07-02 ENCOUNTER — Telehealth: Payer: Self-pay | Admitting: Neurology

## 2020-07-02 NOTE — Telephone Encounter (Signed)
Patient has appt in Oct with Tat  She states that her shaking is really bad and needs to speak with someone about what is going on with her. She can hardly get food to her mouth due to the shaking

## 2020-07-02 NOTE — Telephone Encounter (Signed)
I called pt and she states that she has taken tramadol in the past and that this does not agree with her. She does not want to take this but wants to know if you have any other suggestions.

## 2020-07-02 NOTE — Telephone Encounter (Signed)
Called she will just take tylenol.

## 2020-07-02 NOTE — Telephone Encounter (Signed)
I spoke with patient. She states she is doing well recovering from her surgery but states she is having a terrible time at night. States that she aches all over and has got to have some kind of relief. She was given rx for Norco but does not want to take this. States she does not want anything that confuses her more than she already is.  Tries Tylenol with some relief. Please advise.

## 2020-07-02 NOTE — Telephone Encounter (Signed)
Pt c/o increasing tremor Current tremor meds AND times they are taken: topiramate 50 mg HS  Pts next appointment: 09/17/2020  Knee replacement  06/07/2020 - home from rehab a little over a week. Is requesting an appt sooner than 09/17/2020 d/t increasing tremors. Transferred to scheduling to change appt and get on wait list. Told her I would forward to covering physician for any further guidance, she verbalized understanding.

## 2020-07-02 NOTE — Telephone Encounter (Signed)
We could send in tramadol

## 2020-07-03 MED ORDER — TOPIRAMATE 50 MG PO TABS: ORAL_TABLET | ORAL | 0 refills | Status: DC

## 2020-07-03 NOTE — Telephone Encounter (Signed)
Spoke with pt and inst to try increasing topirimate to 1 and 1/2 tabs at HS, updated script will be sent to pharmacy, she verbalized understanding.

## 2020-07-03 NOTE — Telephone Encounter (Signed)
If no side effects on the Topiramate, pls have her try increasing to 1 and 1/2 tabs qhs. Pls send in updated Rx, thanks

## 2020-07-03 NOTE — Addendum Note (Signed)
Addended by: Ladell Pier A on: 07/03/2020 09:39 AM   Modules accepted: Orders

## 2020-07-08 ENCOUNTER — Telehealth: Payer: Self-pay | Admitting: *Deleted

## 2020-07-08 NOTE — Telephone Encounter (Signed)
Attempted 30 day call. Home number not working and unable to leave VM on cell number after ringing multiple times. Will try again.

## 2020-07-10 NOTE — Telephone Encounter (Signed)
Noted.  She also had an appt 7/15 and same day cx because she had too much going on that day.

## 2020-07-15 ENCOUNTER — Telehealth: Payer: Self-pay | Admitting: *Deleted

## 2020-07-15 NOTE — Telephone Encounter (Signed)
Ortho bundle 30 day call completed. Attempted x 2 unsuccessfully to reach patient. Will attempt at her next in office visit next week.

## 2020-07-18 ENCOUNTER — Ambulatory Visit: Payer: Medicare Other | Admitting: Podiatry

## 2020-07-21 ENCOUNTER — Encounter: Payer: Self-pay | Admitting: Orthopaedic Surgery

## 2020-07-21 ENCOUNTER — Ambulatory Visit (INDEPENDENT_AMBULATORY_CARE_PROVIDER_SITE_OTHER): Payer: Medicare Other | Admitting: Orthopaedic Surgery

## 2020-07-21 DIAGNOSIS — Z96652 Presence of left artificial knee joint: Secondary | ICD-10-CM

## 2020-07-21 NOTE — Progress Notes (Signed)
[name] comes in today about 6 weeks status post a left total knee arthroplasty.  She has been working on range of motion and strength of that left knee and has been doing well.  She ambulates with a rolling walker.  She is still concerned about her balance and coordination since she does live alone.  Her husband is in a memory care unit due to dementia.  On examination of her left operative knee the incision is healed nicely.  There is some residual swelling that to be expected at just 6 weeks postoperative.  She has good strength and stability of that knee.  She does have some chronic back issues and balance and coordination issues in general.  From a therapy standpoint on her left knee, she can just have a home exercise program.  We would however like to have physical therapy work completely different at this point work on her back with core strengthening and back strengthening exercises as well as working on her balance and coordination.  We will see her back in 3 months.  At that visit I like a standing AP and lateral of her left knee but also an AP and lateral of her lumbar spine.  All questions and concerns were answered and addressed otherwise.

## 2020-07-22 ENCOUNTER — Telehealth: Payer: Self-pay | Admitting: *Deleted

## 2020-07-22 NOTE — Care Plan (Signed)
RNCM met with patient and Dr. Ninfa Linden during her 6 week post-op visit yesterday in office. Patient continues to ambulate with a walker and states she feels her Left knee is doing very well since surgery. MD agreed and felt that at 6 weeks post-op her range of motion and strength of the knee is doing very well. Patient notes she is having more pain across her low back and is worried with balance and coordination at this time. MD is agreeable for her to complete therapy for the knee and transition this over to therapy for the back/core strengthening/coordination and balance. She lives at [place] and they have therapy that comes to her apartment at [place], who she has been seeing. CM updated Arminda Resides, Rehab Director for [facility] via voice message today and requested call back with any questions or if they need an order to discontinue therapy for the knee and begin therapy for the back/balance and coordination. Will continue to follow for needs. Reviewed 30 day patient satisfaction survey today as CM was unable to reach patient by phone x 2 attempts at 30 days post-op.

## 2020-07-22 NOTE — Telephone Encounter (Signed)
Ortho bundle 6 week f/u- seen in office. See care plan note.

## 2020-07-28 DIAGNOSIS — I10 Essential (primary) hypertension: Secondary | ICD-10-CM | POA: Diagnosis not present

## 2020-07-28 DIAGNOSIS — R7301 Impaired fasting glucose: Secondary | ICD-10-CM | POA: Diagnosis not present

## 2020-07-28 DIAGNOSIS — M810 Age-related osteoporosis without current pathological fracture: Secondary | ICD-10-CM | POA: Diagnosis not present

## 2020-08-01 DIAGNOSIS — Z7901 Long term (current) use of anticoagulants: Secondary | ICD-10-CM | POA: Diagnosis not present

## 2020-08-01 DIAGNOSIS — G629 Polyneuropathy, unspecified: Secondary | ICD-10-CM | POA: Diagnosis not present

## 2020-08-01 DIAGNOSIS — I10 Essential (primary) hypertension: Secondary | ICD-10-CM | POA: Diagnosis not present

## 2020-08-01 DIAGNOSIS — Z0000 Encounter for general adult medical examination without abnormal findings: Secondary | ICD-10-CM | POA: Diagnosis not present

## 2020-08-01 DIAGNOSIS — N3281 Overactive bladder: Secondary | ICD-10-CM | POA: Diagnosis not present

## 2020-08-01 DIAGNOSIS — G3184 Mild cognitive impairment, so stated: Secondary | ICD-10-CM | POA: Diagnosis not present

## 2020-08-01 DIAGNOSIS — M810 Age-related osteoporosis without current pathological fracture: Secondary | ICD-10-CM | POA: Diagnosis not present

## 2020-08-01 DIAGNOSIS — F3342 Major depressive disorder, recurrent, in full remission: Secondary | ICD-10-CM | POA: Diagnosis not present

## 2020-08-01 DIAGNOSIS — R7301 Impaired fasting glucose: Secondary | ICD-10-CM | POA: Diagnosis not present

## 2020-08-01 DIAGNOSIS — R2681 Unsteadiness on feet: Secondary | ICD-10-CM | POA: Diagnosis not present

## 2020-08-01 DIAGNOSIS — G250 Essential tremor: Secondary | ICD-10-CM | POA: Diagnosis not present

## 2020-08-11 ENCOUNTER — Ambulatory Visit: Payer: Medicare Other | Admitting: Podiatry

## 2020-08-13 NOTE — Progress Notes (Signed)
Assessment/Plan:   1.    Essential tremor  -Continue propranolol, 20 mg twice per day.  May be able to increase slightly in future  -Not candidate for primidone due to Eliquis, which is planned for lifelong therapy per Dr. Alvy Bimler  -stop topamax - may be the source of the paresthesias and the memory change, although had memory change prior to it  -rest tremor likely due to longstanding ET getting worse but some shuffling and slowing on R side now.  Do DaT 2.  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  This had not changed since 2009. Hold on repeat 3.  Recurrent DVT/PE  -On Eliquis.  Follows with hematology   Subjective:   [name] was seen today in follow up for essential tremor.  Medical records are reviewed since last visit.  Pt with daughter who supplements the hx.  We cautiously started Topamax to see if that would help tremor at all.   She called and we increased it to 75 mg nightly.   She reports that "I have just have about shook to death but I think that they are a little bit better."  she had a total knee replacement on the left since last visit.  She states that she had a rough time post op recovering from anesthesia but "I am recovering."  Records reviewed.  Also c/o intermittent short term confusion.  Also c/o intermittent paresthesias after she has been moving or walking and it is from the waist up and in the arms.  It goes away once she sits for a bit.  Bladder/bowel under good control.  No sig neck discomfort.  3 falls in the last week - had her walker with her.  With one she was bending over the pick up dog poop and she kept going and fell over.  Later on, she got up and her leg gave way but she didn't feel related to the knee (same leg).  Couldn't get up.  Called for help to get up.  Currently in OT.  Has completed PT.  Current movement d/o meds:  Propranolol, 20 mg twice per  day Topiramate, 75 mg daily (started last visit)   ALLERGIES:   Allergies  Allergen Reactions  . Buprenorphine Hcl Nausea And Vomiting  . Codeine Nausea And Vomiting    Other reaction(s): Other (See Comments) But tolerates Tramadol But tolerates Tramadol Vomiting   . Morphine And Related Nausea And Vomiting  . Morphine     Other reaction(s): Other (See Comments) Vomiting  . Procaine     Other reaction(s): Other (See Comments) Other reaction(s): Shakes Novacaine-Shakes    CURRENT MEDICATIONS:  Outpatient Encounter Medications as of 08/15/2020  Medication Sig  . acetaminophen (TYLENOL) 500 MG tablet Take 500 mg by mouth every 6 (six) hours as needed (for pain.).   Marland Kitchen amLODipine (NORVASC) 2.5 MG tablet Take 2.5 mg by mouth daily.   Marland Kitchen apixaban (ELIQUIS) 5 MG TABS tablet Take 1 tablet (5 mg total) by mouth 2 (two) times daily.  . busPIRone (BUSPAR) 10 MG tablet Take 10 mg by mouth 2 (two) times daily.   . cholecalciferol (VITAMIN D) 25 MCG (1000 UNIT) tablet Take 1,000 Units by mouth daily.  Marland Kitchen donepezil (ARICEPT) 10 MG tablet Take 10 mg by mouth at bedtime.  Marland Kitchen escitalopram (LEXAPRO) 20 MG tablet Take 20 mg by mouth daily.   Marland Kitchen  esomeprazole (NEXIUM) 40 MG capsule Take 40 mg by mouth 2 (two) times daily before a meal.   . loratadine (CLARITIN) 10 MG tablet Take 10 mg by mouth at bedtime.  . mirabegron ER (MYRBETRIQ) 50 MG TB24 tablet Take 50 mg by mouth at bedtime.   . nitrofurantoin (MACRODANTIN) 100 MG capsule Take 100 mg by mouth in the morning.   . olmesartan (BENICAR) 20 MG tablet Take 20 mg by mouth in the morning and at bedtime.   . propranolol (INDERAL) 20 MG tablet TAKE ONE TABLET BY MOUTH TWICE A DAY (Patient taking differently: Take 20 mg by mouth in the morning and at bedtime. )  . saccharomyces boulardii (FLORASTOR) 250 MG capsule Take 250 mg by mouth 2 (two) times daily.  Marland Kitchen topiramate (TOPAMAX) 50 MG tablet 1 AND 1/2 TABLETS AT BEDTIME  . TOVIAZ 8 MG TB24 tablet Take 8 mg  by mouth at bedtime.   . vitamin B-12 (CYANOCOBALAMIN) 1000 MCG tablet Take 1,000 mcg by mouth daily.  . [DISCONTINUED] carvedilol (COREG) 6.25 MG tablet   . [DISCONTINUED] HYDROcodone-acetaminophen (NORCO/VICODIN) 5-325 MG tablet Take 1 tablet by mouth every 4 (four) hours as needed for moderate pain or severe pain (give every 4-6 hours as needed for pain). (Patient not taking: Reported on 08/15/2020)   No facility-administered encounter medications on file as of 08/15/2020.    Objective:   PHYSICAL EXAMINATION:    VITALS:   Vitals:   08/15/20 1055  BP: (!) 161/73  Pulse: 69  SpO2: 95%  Weight: 188 lb (85.3 kg)  Height: 4\' 11"  (1.499 m)    GEN:  The patient appears stated age and is in NAD. HEENT:  Normocephalic, atraumatic.  The mucous membranes are moist. The superficial temporal arteries are without ropiness or tenderness. CV:  RRR Lungs:  CTAB Neck/HEME:  There are no carotid bruits bilaterally.  Neurological examination:  Orientation: The patient is alert and oriented x3. Cranial nerves: There is good facial symmetry without facial hypomimia. The speech is fluent and clear. Soft palate rises symmetrically and there is no tongue deviation. Hearing is intact to conversational tone. Sensation: Sensation is intact to light touch throughout Motor: Strength is at least antigravity x4.  Movement examination: Tone: There is normal tone in the UE/LE Abnormal movements: there is RUE rest tremor.  She also has postural tremor bilaterally Coordination:  There is decremation with RAM's, with any form of RAMS, including alternating supination and pronation of the forearm, hand opening and closing, finger taps, heel taps and toe taps on the right Gait and Station: The patient has no difficulty arising out of a deep-seated chair without the use of the hands. The patient's stride length is decreased.  Unstable without the walker  I have reviewed and interpreted the following labs  independently    Chemistry      Component Value Date/Time   NA 132 (L) 06/07/2020 0332   K 3.9 06/07/2020 0332   CL 102 06/07/2020 0332   CO2 23 06/07/2020 0332   BUN 15 06/07/2020 0332   CREATININE 0.54 06/07/2020 0332      Component Value Date/Time   CALCIUM 8.1 (L) 06/07/2020 0332   ALKPHOS 49 01/10/2019 2254   AST 14 (L) 01/10/2019 2254   ALT 12 01/10/2019 2254   BILITOT 0.4 01/10/2019 2254       Lab Results  Component Value Date   WBC 12.5 (H) 06/07/2020   HGB 11.5 (L) 06/07/2020   HCT 34.8 (L) 06/07/2020  MCV 96.1 06/07/2020   PLT 183 06/07/2020    Lab Results  Component Value Date   TSH 1.749 05/18/2018     Total time spent on today's visit was 40 minutes, including both face-to-face time and nonface-to-face time.  Time included that spent on review of records (prior notes available to me/labs/imaging if pertinent), discussing treatment and goals, answering patient's questions and coordinating care.  Cc:  [name], MD

## 2020-08-15 ENCOUNTER — Ambulatory Visit (INDEPENDENT_AMBULATORY_CARE_PROVIDER_SITE_OTHER): Payer: Medicare Other | Admitting: Neurology

## 2020-08-15 ENCOUNTER — Encounter: Payer: Self-pay | Admitting: Neurology

## 2020-08-15 ENCOUNTER — Other Ambulatory Visit: Payer: Self-pay

## 2020-08-15 VITALS — BP 161/73 | HR 69 | Ht 59.0 in | Wt 188.0 lb

## 2020-08-15 DIAGNOSIS — G250 Essential tremor: Secondary | ICD-10-CM | POA: Diagnosis not present

## 2020-08-15 DIAGNOSIS — R202 Paresthesia of skin: Secondary | ICD-10-CM | POA: Diagnosis not present

## 2020-08-15 DIAGNOSIS — R251 Tremor, unspecified: Secondary | ICD-10-CM

## 2020-08-15 NOTE — Patient Instructions (Signed)
1.  STOP topiramate.  Let me know if the numbness doesn't resolve when you are off of the medication 2.  We will schedule a DaT scan for you. 3.  We will want to follow up with you after the DaT scan  The physicians and staff at [facility] are committed to providing excellent care. You may receive a survey requesting feedback about your experience at our office. We strive to receive "very good" responses to the survey questions. If you feel that your experience would prevent you from giving the office a "very good " response, please contact our office to try to remedy the situation. We may be reached at [phone]. Thank you for taking the time out of your busy day to complete the survey.

## 2020-08-20 ENCOUNTER — Other Ambulatory Visit (HOSPITAL_COMMUNITY): Payer: Self-pay

## 2020-08-21 ENCOUNTER — Ambulatory Visit (HOSPITAL_COMMUNITY): Payer: Medicare Other

## 2020-08-27 ENCOUNTER — Ambulatory Visit: Payer: Medicare Other | Admitting: Neurology

## 2020-09-03 DIAGNOSIS — L218 Other seborrheic dermatitis: Secondary | ICD-10-CM | POA: Diagnosis not present

## 2020-09-03 DIAGNOSIS — D225 Melanocytic nevi of trunk: Secondary | ICD-10-CM | POA: Diagnosis not present

## 2020-09-03 DIAGNOSIS — D2261 Melanocytic nevi of right upper limb, including shoulder: Secondary | ICD-10-CM | POA: Diagnosis not present

## 2020-09-03 DIAGNOSIS — H3500 Unspecified background retinopathy: Secondary | ICD-10-CM | POA: Diagnosis not present

## 2020-09-03 DIAGNOSIS — Z85828 Personal history of other malignant neoplasm of skin: Secondary | ICD-10-CM | POA: Diagnosis not present

## 2020-09-03 DIAGNOSIS — Z961 Presence of intraocular lens: Secondary | ICD-10-CM | POA: Diagnosis not present

## 2020-09-03 DIAGNOSIS — L570 Actinic keratosis: Secondary | ICD-10-CM | POA: Diagnosis not present

## 2020-09-03 DIAGNOSIS — D2262 Melanocytic nevi of left upper limb, including shoulder: Secondary | ICD-10-CM | POA: Diagnosis not present

## 2020-09-03 DIAGNOSIS — H524 Presbyopia: Secondary | ICD-10-CM | POA: Diagnosis not present

## 2020-09-03 DIAGNOSIS — D1801 Hemangioma of skin and subcutaneous tissue: Secondary | ICD-10-CM | POA: Diagnosis not present

## 2020-09-03 DIAGNOSIS — H52203 Unspecified astigmatism, bilateral: Secondary | ICD-10-CM | POA: Diagnosis not present

## 2020-09-03 DIAGNOSIS — L821 Other seborrheic keratosis: Secondary | ICD-10-CM | POA: Diagnosis not present

## 2020-09-05 ENCOUNTER — Other Ambulatory Visit: Payer: Self-pay | Admitting: Internal Medicine

## 2020-09-05 DIAGNOSIS — Z1231 Encounter for screening mammogram for malignant neoplasm of breast: Secondary | ICD-10-CM

## 2020-09-08 ENCOUNTER — Telehealth: Payer: Self-pay

## 2020-09-08 NOTE — Telephone Encounter (Signed)
Coming in tomorrow morning

## 2020-09-08 NOTE — Telephone Encounter (Signed)
Patient called in wanting to speak with you . Says she was informed to give you a call

## 2020-09-09 ENCOUNTER — Ambulatory Visit: Payer: Self-pay

## 2020-09-09 ENCOUNTER — Ambulatory Visit (INDEPENDENT_AMBULATORY_CARE_PROVIDER_SITE_OTHER): Payer: Medicare Other | Admitting: Orthopaedic Surgery

## 2020-09-09 ENCOUNTER — Encounter: Payer: Self-pay | Admitting: Orthopaedic Surgery

## 2020-09-09 ENCOUNTER — Telehealth: Payer: Self-pay | Admitting: *Deleted

## 2020-09-09 DIAGNOSIS — M79605 Pain in left leg: Secondary | ICD-10-CM

## 2020-09-09 DIAGNOSIS — Z96652 Presence of left artificial knee joint: Secondary | ICD-10-CM | POA: Diagnosis not present

## 2020-09-09 NOTE — Telephone Encounter (Signed)
90 day Ortho bundle call completed. 

## 2020-09-09 NOTE — Progress Notes (Signed)
The patient is right at 3 months status post a left total knee arthroplasty.  She is 79 year old female.  She ambulates with a rolling walker.  She is only taking Tylenol for pain at night.  She has difficulty sleeping.  She wakes up about 5 or 6 AM every morning.  She reports that she is doing well overall but is having some pain below her knee.  This is in the anterior aspect of the leg and not posteriorly.  Examination of her left knee shows fluid and full range of motion of that knee.  It feels ligamentously stable.  She has good flexion-extension at her foot and ankle as well.  Her calf is soft.  There is a little bit of anterior pain.  2 views of the left knee show well-seated total knee arthroplasty with no complicating features.  X-rays of lumbar spine show a significant spondylolisthesis between L4 and L5 that is been well-documented.  She says her back does not hurt and she does not describe radicular pain.  I gave her reassurance that the knee is doing well and she should continue to improve with time.  I recommended to try Benadryl to help her sleep at night.  All questions and concerns were answered and addressed.  I really do not need to see her back for 6 months unless she is having any issues at all.  We can have an AP and lateral of her left knee at that visit.

## 2020-09-11 ENCOUNTER — Other Ambulatory Visit (HOSPITAL_COMMUNITY): Payer: Medicare Other

## 2020-09-11 ENCOUNTER — Ambulatory Visit (HOSPITAL_COMMUNITY): Payer: Medicare Other

## 2020-09-12 ENCOUNTER — Encounter: Payer: Self-pay | Admitting: Podiatry

## 2020-09-12 ENCOUNTER — Other Ambulatory Visit: Payer: Self-pay

## 2020-09-12 ENCOUNTER — Ambulatory Visit (INDEPENDENT_AMBULATORY_CARE_PROVIDER_SITE_OTHER): Payer: Medicare Other | Admitting: Podiatry

## 2020-09-12 DIAGNOSIS — B351 Tinea unguium: Secondary | ICD-10-CM

## 2020-09-12 DIAGNOSIS — M79674 Pain in right toe(s): Secondary | ICD-10-CM | POA: Diagnosis not present

## 2020-09-12 DIAGNOSIS — L603 Nail dystrophy: Secondary | ICD-10-CM | POA: Diagnosis not present

## 2020-09-12 DIAGNOSIS — M79675 Pain in left toe(s): Secondary | ICD-10-CM | POA: Diagnosis not present

## 2020-09-12 NOTE — Progress Notes (Signed)
  Subjective:  Patient ID: [name], female    DOB: Feb 18, 1941,  MRN: [id number]  No chief complaint on file.  79 y.o. female returns for the above complaint.  Patient presents with a follow-up with thickened elongated mycotic toenails that has been causing a lot of pain.  Patient has not been able to debride them.  She states that she would like to have them debrided is causing her pain when ambulating.  She denies any other acute complaints.  Patient has a secondary complaint of left dystrophy of the hallux that has been very painful to touch.  Patient would like to have removed.  She denies any other acute complaints.  Objective:  There were no vitals filed for this visit. Podiatric Exam: Vascular: dorsalis pedis and posterior tibial pulses are palpable bilateral. Capillary return is immediate. Temperature gradient is WNL. Skin turgor WNL  Sensorium: Normal Semmes Weinstein monofilament test. Normal tactile sensation bilaterally. Nail Exam: Pt has thick disfigured discolored nails with subungual debris noted bilateral entire nail hallux through fifth toenails. Pain on palpation of the entire/total nail on 1st digit of the left Ulcer Exam: There is no evidence of ulcer or pre-ulcerative changes or infection. Orthopedic Exam: Muscle tone and strength are WNL. No limitations in general ROM.  Mild crepitus noted of the first metatarsophalangeal joint with range of motion active and passive to the right foot.  Intra-articular pain noted to the right first metatarsophalangeal joint.  No extensor or flexor tendinitis noted.  HAV  B/L.  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 Skin: No Porokeratosis. No infection or ulcers   Assessment:   1. Pain due to onychomycosis of toenails of both feet   2. Nail dystrophy    Plan:  Patient was evaluated and treated and all questions answered.  Nail contusion/dystrophy hallux, left -Patient elects to proceed with minor surgery to remove entire toenail today. Consent  reviewed and signed by patient. -Entire/total nail excised. See procedure note. -Educated on post-procedure care including soaking. Written instructions provided and reviewed. -Patient to follow up in 2 weeks for nail check.  Procedure: Excision of entire/total nail  Location: Left 1st toe digit Anesthesia: Lidocaine 1% plain; 1.5 mL and Marcaine 0.5% plain; 1.5 mL, digital block. Skin Prep: Betadine. Dressing: Silvadene; telfa; dry, sterile, compression dressing. Technique: Following skin prep, the toe was exsanguinated and a tourniquet was secured at the base of the toe. The affected nail border was freed and excised. The tourniquet was then removed and sterile dressing applied. Disposition: Patient tolerated procedure well. Patient to return in 2 weeks for follow-up.   No follow-ups on file.   Right first metatarsophalangeal joint arthritis/capsulitis -Clinically resolved with one steroid injection.  Onychomycosis with pain  -Nails palliatively debrided as below. -Educated on self-care  Procedure: Nail Debridement Rationale: pain  Type of Debridement: manual, sharp debridement. Instrumentation: Nail nipper, rotary burr. Number of Nails: Nine  Procedures and Treatment: Consent by patient was obtained for treatment procedures. The patient understood the discussion of treatment and procedures well. All questions were answered thoroughly reviewed. Debridement of mycotic and hypertrophic toenails, 1 through 5 bilateral and clearing of subungual debris. No ulceration, no infection noted.  Return Visit-Office Procedure: Patient instructed to return to the office for a follow up visit 3 months for continued evaluation and treatment.  Boneta Lucks, DPM    No follow-ups on file.

## 2020-09-17 ENCOUNTER — Ambulatory Visit: Payer: Medicare Other | Admitting: Neurology

## 2020-09-18 ENCOUNTER — Other Ambulatory Visit: Payer: Self-pay

## 2020-09-18 ENCOUNTER — Encounter (HOSPITAL_COMMUNITY)
Admission: RE | Admit: 2020-09-18 | Discharge: 2020-09-18 | Disposition: A | Discharge status: Home / Self Care | Payer: Medicare Other | Source: Ambulatory Visit | Attending: Neurology | Admitting: Neurology

## 2020-09-18 ENCOUNTER — Telehealth: Payer: Self-pay | Admitting: Neurology

## 2020-09-18 DIAGNOSIS — G250 Essential tremor: Secondary | ICD-10-CM | POA: Insufficient documentation

## 2020-09-18 DIAGNOSIS — R413 Other amnesia: Secondary | ICD-10-CM | POA: Diagnosis not present

## 2020-09-18 DIAGNOSIS — G20 Parkinson's disease: Secondary | ICD-10-CM | POA: Diagnosis not present

## 2020-09-18 MED ORDER — IODINE STRONG (LUGOLS) 5 % PO SOLN
ORAL | Status: AC
  Administered 2020-09-18: 0.08 mL via ORAL
  Filled 2020-09-18: qty 1

## 2020-09-18 MED ORDER — IOFLUPANE I 123 185 MBQ/2.5ML IV SOLN
4.8000 | Freq: Once | INTRAVENOUS | Status: AC | PRN
  Administered 2020-09-18: 4.8 via INTRAVENOUS
  Filled 2020-09-18: qty 5

## 2020-09-18 NOTE — Telephone Encounter (Signed)
Contacted patient to schedule f/u visit on Wednesday, 09/24/20 @ 9:45am to go over DaT scan results per Dr Doristine Devoid request

## 2020-09-19 DIAGNOSIS — Z23 Encounter for immunization: Secondary | ICD-10-CM | POA: Diagnosis not present

## 2020-09-19 NOTE — Progress Notes (Signed)
Virtual Visit Via Video   The purpose of this virtual visit is to provide medical care while limiting exposure to the novel coronavirus.    Consent was obtained for video visit:  Yes.   Answered questions that patient had about telehealth interaction:  Yes.   I discussed the limitations, risks, security and privacy concerns of performing an evaluation and management service by telemedicine. I also discussed with the patient that there may be a patient responsible charge related to this service. The patient expressed understanding and agreed to proceed.  Pt location: Home Physician Location: office Name of referring provider:  [name], MD I connected with [name] at patients initiation/request on 09/24/2020 at  9:45 AM EDT by video enabled telemedicine application and verified that I am speaking with the correct person using two identifiers. Pt MRN:  [id number] Pt DOB:  [birth year] Video Participants:  [name];  Nursing aid  Assessment/Plan:   1.  ET/PD  -Discussed nature and pathophysiology, and overall good prognosis with this time.  -Start carbidopa/levodopa 25/100 and work to 1 tablet at 7 AM/11 AM/4 PM.  R/B/SE were discussed.  The opportunity to ask questions was given and they were answered to the best of my ability.  The patient expressed understanding and willingness to follow the outlined treatment protocols.  -For now, continue propranolol, 20 mg twice per day.  -she just finished Physical therapy 2 weeks ago  -will send patient instructions in mail per her request.  Did tell her it may take a while  2.  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  That had not changed since 2009.  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  Subjective   Patient seen today in follow-up for DaTscan.  Last visit, I was somewhat suspicious that perhaps she was started develop Parkinson's.  She had  a DaTscan done since last visit.  It was just mildly positive, with evidence of loss of dopamine on the left.  This did correlate with clinical symptoms.  I stopped her topiramate last visit, primarily because of complaints of more memory change.  She does think going off of it helped.  Current movement d/o meds:  Propranolol, 20 mg twice per day  Prior meds: Topiramate (memory change)   Current Outpatient Medications on File Prior to Visit  Medication Sig Dispense Refill  . acetaminophen (TYLENOL) 500 MG tablet Take 500 mg by mouth every 6 (six) hours as needed (for pain.).     Marland Kitchen amLODipine (NORVASC) 2.5 MG tablet Take 2.5 mg by mouth daily.     Marland Kitchen apixaban (ELIQUIS) 5 MG TABS tablet Take 1 tablet (5 mg total) by mouth 2 (two) times daily. 60 tablet 0  . busPIRone (BUSPAR) 10 MG tablet Take 10 mg by mouth 2 (two) times daily.     . cholecalciferol (VITAMIN D) 25 MCG (1000 UNIT) tablet Take 1,000 Units by mouth daily.    Marland Kitchen donepezil (ARICEPT) 10 MG tablet Take 10 mg by mouth at bedtime.    Marland Kitchen escitalopram (LEXAPRO) 20 MG tablet Take 20 mg by mouth daily.     Marland Kitchen esomeprazole (NEXIUM) 40 MG capsule Take 40 mg by mouth 2 (two) times daily before a meal.     . loratadine (CLARITIN) 10 MG tablet Take 10 mg by mouth at bedtime.    . mirabegron ER (MYRBETRIQ) 50 MG TB24 tablet  Take 50 mg by mouth at bedtime.     . nitrofurantoin (MACRODANTIN) 100 MG capsule Take 100 mg by mouth in the morning.     . olmesartan (BENICAR) 20 MG tablet Take 20 mg by mouth in the morning and at bedtime.     . propranolol (INDERAL) 20 MG tablet TAKE ONE TABLET BY MOUTH TWICE A DAY (Patient taking differently: Take 20 mg by mouth in the morning and at bedtime. ) 180 tablet 4  . saccharomyces boulardii (FLORASTOR) 250 MG capsule Take 250 mg by mouth 2 (two) times daily.    . TOVIAZ 8 MG TB24 tablet Take 8 mg by mouth at bedtime.     . vitamin B-12 (CYANOCOBALAMIN) 1000 MCG tablet Take 1,000 mcg by mouth daily.    .  [DISCONTINUED] carvedilol (COREG) 6.25 MG tablet      No current facility-administered medications on file prior to visit.     Objective   Vitals:   09/24/20 0842  Weight: 187 lb (84.8 kg)  Height: 5' (1.524 m)   GEN:  The patient appears stated age and is in NAD.  Neurological examination:  Orientation: The patient is alert and oriented x3. Cranial nerves: There is good facial symmetry. There is min facial hypomimia.  The speech is fluent and clear. Soft palate rises symmetrically and there is no tongue deviation. Hearing is intact to conversational tone. Motor: Strength is at least antigravity x 4.   Shoulder shrug is equal and symmetric.  There is no pronator drift.  Movement examination: Tone: unable Abnormal movements: There is right upper extremity rest tremor.  There is postural tremor bilaterally. Coordination:  There is decremation with RAM's, on the right Gait and Station: not tested    Follow up Instructions      -I discussed the assessment and treatment plan with the patient. The patient was provided an opportunity to ask questions and all were answered. The patient agreed with the plan and demonstrated an understanding of the instructions.   The patient was advised to call back or seek an in-person evaluation if the symptoms worsen or if the condition fails to improve as anticipated.    Total time spent on today's visit was 30 minutes, including both face-to-face time and nonface-to-face time.  Time included that spent on review of records (prior notes available to me/labs/imaging if pertinent), discussing treatment and goals, answering patient's questions and coordinating care.   Alonza Bogus, DO

## 2020-09-24 ENCOUNTER — Other Ambulatory Visit: Payer: Self-pay

## 2020-09-24 ENCOUNTER — Telehealth (INDEPENDENT_AMBULATORY_CARE_PROVIDER_SITE_OTHER): Payer: Medicare Other | Admitting: Neurology

## 2020-09-24 ENCOUNTER — Encounter: Payer: Self-pay | Admitting: Neurology

## 2020-09-24 VITALS — Ht 60.0 in | Wt 187.0 lb

## 2020-09-24 DIAGNOSIS — G20 Parkinson's disease: Secondary | ICD-10-CM

## 2020-09-24 MED ORDER — CARBIDOPA-LEVODOPA 25-100 MG PO TABS: ORAL_TABLET | ORAL | 1 refills | Status: DC

## 2020-09-24 NOTE — Addendum Note (Signed)
Addended by: Ulice Brilliant T on: 09/24/2020 10:36 AM   Modules accepted: Orders

## 2020-09-24 NOTE — Patient Instructions (Signed)
Start Carbidopa Levodopa as follows:  Take 1/2 tablet three times daily, at least 30 minutes before meals (approximately 8am/noon/4pm), for one week  Then take 1/2 tablet in the morning, 1/2 tablet in the afternoon, 1 tablet in the evening, at least 30 minutes before meals, for one week  Then take 1/2 tablet in the morning, 1 tablet in the afternoon, 1 tablet in the evening, at least 30 minutes before meals, for one week  Then take 1 tablet three times daily at 8am/noon/4pm, at least 30 minutes before meals   As a reminder, carbidopa/levodopa can be taken at the same time as a carbohydrate, but we like to have you take your pill either 30 minutes before a protein source or 1 hour after as protein can interfere with carbidopa/levodopa absorption.    

## 2020-10-16 ENCOUNTER — Ambulatory Visit: Payer: Medicare Other

## 2020-10-20 ENCOUNTER — Ambulatory Visit: Payer: Medicare Other | Admitting: Orthopaedic Surgery

## 2020-11-01 ENCOUNTER — Other Ambulatory Visit: Payer: Self-pay | Admitting: Neurology

## 2020-11-03 NOTE — Telephone Encounter (Signed)
Rx(s) sent to pharmacy electronically.  

## 2020-11-08 ENCOUNTER — Other Ambulatory Visit: Payer: Self-pay | Admitting: Neurology

## 2020-11-20 DIAGNOSIS — N3020 Other chronic cystitis without hematuria: Secondary | ICD-10-CM | POA: Diagnosis not present

## 2020-11-20 DIAGNOSIS — R350 Frequency of micturition: Secondary | ICD-10-CM | POA: Diagnosis not present

## 2020-11-24 ENCOUNTER — Other Ambulatory Visit: Payer: Self-pay

## 2020-11-24 ENCOUNTER — Ambulatory Visit
Admission: RE | Admit: 2020-11-24 | Discharge: 2020-11-24 | Disposition: A | Discharge status: Home / Self Care | Payer: Medicare Other | Source: Ambulatory Visit | Attending: Internal Medicine | Admitting: Internal Medicine

## 2020-11-24 DIAGNOSIS — Z1231 Encounter for screening mammogram for malignant neoplasm of breast: Secondary | ICD-10-CM | POA: Diagnosis not present

## 2020-11-27 DIAGNOSIS — G629 Polyneuropathy, unspecified: Secondary | ICD-10-CM | POA: Diagnosis not present

## 2020-11-27 DIAGNOSIS — G3184 Mild cognitive impairment, so stated: Secondary | ICD-10-CM | POA: Diagnosis not present

## 2020-11-27 DIAGNOSIS — N3281 Overactive bladder: Secondary | ICD-10-CM | POA: Diagnosis not present

## 2020-11-27 DIAGNOSIS — M810 Age-related osteoporosis without current pathological fracture: Secondary | ICD-10-CM | POA: Diagnosis not present

## 2020-11-27 DIAGNOSIS — G250 Essential tremor: Secondary | ICD-10-CM | POA: Diagnosis not present

## 2020-11-27 DIAGNOSIS — R2681 Unsteadiness on feet: Secondary | ICD-10-CM | POA: Diagnosis not present

## 2020-11-27 DIAGNOSIS — I10 Essential (primary) hypertension: Secondary | ICD-10-CM | POA: Diagnosis not present

## 2020-12-19 ENCOUNTER — Other Ambulatory Visit: Payer: Self-pay

## 2020-12-19 ENCOUNTER — Ambulatory Visit (INDEPENDENT_AMBULATORY_CARE_PROVIDER_SITE_OTHER): Payer: Medicare Other | Admitting: Podiatry

## 2020-12-19 DIAGNOSIS — M79675 Pain in left toe(s): Secondary | ICD-10-CM | POA: Diagnosis not present

## 2020-12-19 DIAGNOSIS — B351 Tinea unguium: Secondary | ICD-10-CM | POA: Diagnosis not present

## 2020-12-19 DIAGNOSIS — M79674 Pain in right toe(s): Secondary | ICD-10-CM | POA: Diagnosis not present

## 2020-12-23 ENCOUNTER — Encounter: Payer: Self-pay | Admitting: Podiatry

## 2020-12-23 NOTE — Progress Notes (Signed)
  Subjective:  Patient ID: SHAKTI FLEER, female    DOB: 06-19-1941,  MRN: [id number]  Chief Complaint  Patient presents with  . routine foot care    Nail trim    80 y.o. female returns for the above complaint.  Patient presents with a follow-up with thickened elongated mycotic toenails that has been causing a lot of pain.  Patient has not been able to debride them.  She states that she would like to have them debrided is causing her pain when ambulating.  She denies any other acute complaints. s.  Objective:  There were no vitals filed for this visit. Podiatric Exam: Vascular: dorsalis pedis and posterior tibial pulses are palpable bilateral. Capillary return is immediate. Temperature gradient is WNL. Skin turgor WNL  Sensorium: Normal Semmes Weinstein monofilament test. Normal tactile sensation bilaterally. Nail Exam: Pt has thick disfigured discolored nails with subungual debris noted bilateral entire nail hallux through fifth toenails. Pain on palpation of the entire/total nail on 1st digit of the left Ulcer Exam: There is no evidence of ulcer or pre-ulcerative changes or infection. Orthopedic Exam: Muscle tone and strength are WNL. No limitations in general ROM.  No further crepitus noted at the first metatarsophalangeal joint with range of motion active and passive to the right foot.  No further intra-articular pain noted to the right first metatarsophalangeal joint.  No extensor or flexor tendinitis noted.  HAV  B/L.  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 Skin: No Porokeratosis. No infection or ulcers   Assessment:   1. Pain due to onychomycosis of toenails of both feet    Plan:  Patient was evaluated and treated and all questions answered.  Nail contusion/dystrophy hallux, left -Clinically healed  No follow-ups on file.   Right first metatarsophalangeal joint arthritis/capsulitis -Clinically resolved with one steroid injection.  Onychomycosis with pain  -Nails palliatively debrided as  below. -Educated on self-care  Procedure: Nail Debridement Rationale: pain  Type of Debridement: manual, sharp debridement. Instrumentation: Nail nipper, rotary burr. Number of Nails: Nine  Procedures and Treatment: Consent by patient was obtained for treatment procedures. The patient understood the discussion of treatment and procedures well. All questions were answered thoroughly reviewed. Debridement of mycotic and hypertrophic toenails, 1 through 5 bilateral and clearing of subungual debris. No ulceration, no infection noted.  Return Visit-Office Procedure: Patient instructed to return to the office for a follow up visit 3 months for continued evaluation and treatment.  Boneta Lucks, DPM    No follow-ups on file.

## 2021-01-14 ENCOUNTER — Other Ambulatory Visit: Payer: Self-pay | Admitting: Neurology

## 2021-01-15 NOTE — Telephone Encounter (Signed)
Rx(s) sent to pharmacy electronically.  

## 2021-02-04 DIAGNOSIS — M810 Age-related osteoporosis without current pathological fracture: Secondary | ICD-10-CM | POA: Diagnosis not present

## 2021-02-04 DIAGNOSIS — G4700 Insomnia, unspecified: Secondary | ICD-10-CM | POA: Diagnosis not present

## 2021-02-04 DIAGNOSIS — D6869 Other thrombophilia: Secondary | ICD-10-CM | POA: Diagnosis not present

## 2021-02-04 DIAGNOSIS — Z7901 Long term (current) use of anticoagulants: Secondary | ICD-10-CM | POA: Diagnosis not present

## 2021-02-04 DIAGNOSIS — I10 Essential (primary) hypertension: Secondary | ICD-10-CM | POA: Diagnosis not present

## 2021-02-04 DIAGNOSIS — F3342 Major depressive disorder, recurrent, in full remission: Secondary | ICD-10-CM | POA: Diagnosis not present

## 2021-02-04 DIAGNOSIS — N3281 Overactive bladder: Secondary | ICD-10-CM | POA: Diagnosis not present

## 2021-02-04 DIAGNOSIS — Z23 Encounter for immunization: Secondary | ICD-10-CM | POA: Diagnosis not present

## 2021-02-04 DIAGNOSIS — R7301 Impaired fasting glucose: Secondary | ICD-10-CM | POA: Diagnosis not present

## 2021-02-04 DIAGNOSIS — G250 Essential tremor: Secondary | ICD-10-CM | POA: Diagnosis not present

## 2021-02-04 DIAGNOSIS — G3184 Mild cognitive impairment, so stated: Secondary | ICD-10-CM | POA: Diagnosis not present

## 2021-02-04 DIAGNOSIS — R2681 Unsteadiness on feet: Secondary | ICD-10-CM | POA: Diagnosis not present

## 2021-02-04 DIAGNOSIS — G629 Polyneuropathy, unspecified: Secondary | ICD-10-CM | POA: Diagnosis not present

## 2021-02-04 DIAGNOSIS — Z86711 Personal history of pulmonary embolism: Secondary | ICD-10-CM | POA: Diagnosis not present

## 2021-02-04 DIAGNOSIS — Z86718 Personal history of other venous thrombosis and embolism: Secondary | ICD-10-CM | POA: Diagnosis not present

## 2021-02-25 ENCOUNTER — Other Ambulatory Visit (HOSPITAL_COMMUNITY): Payer: Self-pay | Admitting: *Deleted

## 2021-02-26 ENCOUNTER — Other Ambulatory Visit: Payer: Self-pay

## 2021-02-26 ENCOUNTER — Ambulatory Visit (HOSPITAL_COMMUNITY)
Admission: RE | Admit: 2021-02-26 | Discharge: 2021-02-26 | Disposition: A | Discharge status: Home / Self Care | Payer: Medicare Other | Source: Ambulatory Visit | Attending: Internal Medicine | Admitting: Internal Medicine

## 2021-02-26 DIAGNOSIS — M810 Age-related osteoporosis without current pathological fracture: Secondary | ICD-10-CM | POA: Insufficient documentation

## 2021-02-26 MED ORDER — ZOLEDRONIC ACID 5 MG/100ML IV SOLN: 5.0000 mg | Freq: Once | INTRAVENOUS | Status: AC

## 2021-02-26 MED ORDER — ZOLEDRONIC ACID 5 MG/100ML IV SOLN
INTRAVENOUS | Status: AC
  Administered 2021-02-26: 5 mg via INTRAVENOUS
  Filled 2021-02-26: qty 100

## 2021-03-09 ENCOUNTER — Encounter: Payer: Self-pay | Admitting: Orthopaedic Surgery

## 2021-03-09 ENCOUNTER — Ambulatory Visit (INDEPENDENT_AMBULATORY_CARE_PROVIDER_SITE_OTHER): Payer: Medicare Other

## 2021-03-09 ENCOUNTER — Ambulatory Visit (INDEPENDENT_AMBULATORY_CARE_PROVIDER_SITE_OTHER): Payer: Medicare Other | Admitting: Orthopaedic Surgery

## 2021-03-09 DIAGNOSIS — Z96652 Presence of left artificial knee joint: Secondary | ICD-10-CM

## 2021-03-09 NOTE — Progress Notes (Signed)
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  She does ambulate with a rolling walker due to balance issues and back issues but says her left knee is doing great and she has no issues with it at all.  Her left knee has full flexion and extension.  It feels ligamentously stable.  There is no swelling.  Her calf is soft.  2 views left knee show well-seated total knee arthroplasty with no complicating features.  At this point follow-up can be as needed with that knee since she is doing so well.  All questions and concerns were answered and addressed.  If she ends up having any issues at all she will let us know.

## 2021-03-12 NOTE — Progress Notes (Signed)
Assessment/Plan:   1.  ET/PD  -discussed with her that she needs to take her carbidopa/levodopa 25/100 at 9am/1pm/5pm.  She has been taking it in the AM and at the end of the day.  She overall looks better.  -Continue propranolol, 20 mg twice per day  -RX PT [facility]  -has caregivers 3 days per week (2 hours each)  2.  Recurrent DVT/PE  -On Eliquis   Subjective:   [name] was seen today in follow up for ET/Parkinsons disease.  My previous records were reviewed prior to todays visit as well as outside records available to me. States that she is shaking terribly.  Pt denies falls.  Pt denies lightheadedness, near syncope.  No hallucinations.  Mood has been good.  She asks me about topamax - states that Dr. [name] wondered if contributing to memory.  I stopped her topamax last sept but she doesn't know if she is on it (suspect she isn't as I haven't RX)  Current prescribed movement disorder medications: Carbidopa/levodopa 25/100, 1 tablet 3 times per day (started last visit) - has trouble remembering middle of day dose - takes it at 9am and 9-10pm and may get middle of the day dosage in 60% of time Propranolol 20 mg twice per day   PREVIOUS MEDICATIONS: Topiramate, memory change  ALLERGIES:   Allergies  Allergen Reactions  . Buprenorphine Hcl Nausea And Vomiting  . Codeine Nausea And Vomiting    Other reaction(s): Other (See Comments) But tolerates Tramadol But tolerates Tramadol Vomiting   . Morphine And Related Nausea And Vomiting  . Morphine     Other reaction(s): Other (See Comments) Vomiting  . Procaine     Other reaction(s): Other (See Comments) Other reaction(s): Shakes Novacaine-Shakes    CURRENT MEDICATIONS:  Outpatient Encounter Medications as of 03/13/2021  Medication Sig  . acetaminophen (TYLENOL) 500 MG tablet Take 500 mg by mouth every 6 (six) hours as needed (for pain.).   Marland Kitchen amLODipine (NORVASC) 2.5 MG tablet Take 2.5 mg by mouth daily.   Marland Kitchen  apixaban (ELIQUIS) 5 MG TABS tablet Take 1 tablet (5 mg total) by mouth 2 (two) times daily.  . busPIRone (BUSPAR) 10 MG tablet Take 10 mg by mouth 2 (two) times daily.   . carbidopa-levodopa (SINEMET IR) 25-100 MG tablet TAKE 1 TABLET BY MOUTH THREE TIMES A DAY AT 8AM, 12PM AND 4PM  . cholecalciferol (VITAMIN D) 25 MCG (1000 UNIT) tablet Take 1,000 Units by mouth daily.  Marland Kitchen donepezil (ARICEPT) 10 MG tablet Take 10 mg by mouth at bedtime.  Marland Kitchen escitalopram (LEXAPRO) 20 MG tablet Take 20 mg by mouth daily.   Marland Kitchen esomeprazole (NEXIUM) 40 MG capsule Take 40 mg by mouth 2 (two) times daily before a meal.   . loratadine (CLARITIN) 10 MG tablet Take 10 mg by mouth at bedtime.  . mirabegron ER (MYRBETRIQ) 50 MG TB24 tablet Take 50 mg by mouth at bedtime.   . nitrofurantoin (MACRODANTIN) 100 MG capsule Take 100 mg by mouth in the morning.  . olmesartan (BENICAR) 20 MG tablet Take 20 mg by mouth in the morning and at bedtime.   . propranolol (INDERAL) 20 MG tablet Take 1 tablet (20 mg total) by mouth 2 (two) times daily.  . TOVIAZ 8 MG TB24 tablet Take 8 mg by mouth at bedtime.   . vitamin B-12 (CYANOCOBALAMIN) 1000 MCG tablet Take 1,000 mcg by mouth daily.  Marland Kitchen saccharomyces boulardii (FLORASTOR) 250 MG capsule Take 250 mg  by mouth 2 (two) times daily.  . [DISCONTINUED] carvedilol (COREG) 6.25 MG tablet    No facility-administered encounter medications on file as of 03/13/2021.    Objective:   PHYSICAL EXAMINATION:    VITALS:   Vitals:   03/13/21 1424  BP: 122/70  Pulse: 69  SpO2: 99%  Weight: 204 lb 12.8 oz (92.9 kg)  Height: 4\' 11"  (1.499 m)    GEN:  The patient appears stated age and is in NAD. HEENT:  Normocephalic, atraumatic.  The mucous membranes are moist.   Neurological examination:  Orientation: The patient is alert and oriented x3. Cranial nerves: There is good facial symmetry without facial hypomimia. The speech is fluent and clear. Soft palate rises symmetrically and there is no  tongue deviation. Hearing is intact to conversational tone. Sensation: Sensation is intact to light touch throughout Motor: Strength is at least antigravity x4.  Movement examination: Tone: There is nl tone in the UE/LE Abnormal movements: there is rest tremor, very mild, bilaterally Coordination:  There is min decremation with RAM's, on the right Gait and Station: The patient has mild difficulty arising out of a deep-seated chair without the use of the hands. The patient's stride length is good with her walker.    I have reviewed and interpreted the following labs independently    Chemistry      Component Value Date/Time   NA 132 (L) 06/07/2020 0332   K 3.9 06/07/2020 0332   CL 102 06/07/2020 0332   CO2 23 06/07/2020 0332   BUN 15 06/07/2020 0332   CREATININE 0.54 06/07/2020 0332      Component Value Date/Time   CALCIUM 8.1 (L) 06/07/2020 0332   ALKPHOS 49 01/10/2019 2254   AST 14 (L) 01/10/2019 2254   ALT 12 01/10/2019 2254   BILITOT 0.4 01/10/2019 2254       Lab Results  Component Value Date   WBC 12.5 (H) 06/07/2020   HGB 11.5 (L) 06/07/2020   HCT 34.8 (L) 06/07/2020   MCV 96.1 06/07/2020   PLT 183 06/07/2020    Lab Results  Component Value Date   TSH 1.749 05/18/2018     Total time spent on today's visit was 30 minutes, including both face-to-face time and nonface-to-face time.  Time included that spent on review of records (prior notes available to me/labs/imaging if pertinent), discussing treatment and goals, answering patient's questions and coordinating care.  Cc:  Ginger Organ., MD

## 2021-03-13 ENCOUNTER — Encounter: Payer: Self-pay | Admitting: Neurology

## 2021-03-13 ENCOUNTER — Other Ambulatory Visit: Payer: Self-pay

## 2021-03-13 ENCOUNTER — Ambulatory Visit (INDEPENDENT_AMBULATORY_CARE_PROVIDER_SITE_OTHER): Payer: Medicare Other | Admitting: Neurology

## 2021-03-13 VITALS — BP 122/70 | HR 69 | Ht 59.0 in | Wt 204.8 lb

## 2021-03-13 DIAGNOSIS — G20 Parkinson's disease: Secondary | ICD-10-CM | POA: Diagnosis not present

## 2021-03-16 ENCOUNTER — Telehealth: Payer: Self-pay

## 2021-03-16 NOTE — Telephone Encounter (Signed)
Left message for patient checking to see if she gave the order for PT to the facility where she stays. Advised a callback with any questions or concerns.

## 2021-03-20 ENCOUNTER — Ambulatory Visit (INDEPENDENT_AMBULATORY_CARE_PROVIDER_SITE_OTHER): Payer: Medicare Other | Admitting: Podiatry

## 2021-03-20 ENCOUNTER — Encounter: Payer: Self-pay | Admitting: Podiatry

## 2021-03-20 ENCOUNTER — Other Ambulatory Visit: Payer: Self-pay

## 2021-03-20 DIAGNOSIS — B351 Tinea unguium: Secondary | ICD-10-CM

## 2021-03-20 DIAGNOSIS — M79674 Pain in right toe(s): Secondary | ICD-10-CM

## 2021-03-20 DIAGNOSIS — M79675 Pain in left toe(s): Secondary | ICD-10-CM

## 2021-03-20 NOTE — Progress Notes (Signed)
  Subjective:  Patient ID: [name], female    DOB: Jul 16, 1941,  MRN: [id number]  Chief Complaint  Patient presents with  . Nail Problem    Nail trim    80 y.o. female returns for the above complaint.  Patient presents with a follow-up with thickened elongated mycotic toenails that has been causing a lot of pain.  Patient has not been able to debride them.  She states that she would like to have them debrided is causing her pain when ambulating.  She denies any other acute complaints. s.  Objective:  There were no vitals filed for this visit. Podiatric Exam: Vascular: dorsalis pedis and posterior tibial pulses are palpable bilateral. Capillary return is immediate. Temperature gradient is WNL. Skin turgor WNL  Sensorium: Normal Semmes Weinstein monofilament test. Normal tactile sensation bilaterally. Nail Exam: Pt has thick disfigured discolored nails with subungual debris noted bilateral entire nail hallux through fifth toenails. Pain on palpation of the entire/total nail on 1st digit of the left Ulcer Exam: There is no evidence of ulcer or pre-ulcerative changes or infection. Orthopedic Exam: Muscle tone and strength are WNL. No limitations in general ROM.  No further crepitus noted at the first metatarsophalangeal joint with range of motion active and passive to the right foot.  No further intra-articular pain noted to the right first metatarsophalangeal joint.  No extensor or flexor tendinitis noted.  HAV  B/L.  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 Skin: No Porokeratosis. No infection or ulcers   Assessment:   1. Pain due to onychomycosis of toenails of both feet    Plan:  Patient was evaluated and treated and all questions answered.  Nail contusion/dystrophy hallux, left -Clinically healed  No follow-ups on file.   Right first metatarsophalangeal joint arthritis/capsulitis -Clinically resolved with one steroid injection.  Onychomycosis with pain  -Nails palliatively debrided as  below. -Educated on self-care  Procedure: Nail Debridement Rationale: pain  Type of Debridement: manual, sharp debridement. Instrumentation: Nail nipper, rotary burr. Number of Nails: Nine  Procedures and Treatment: Consent by patient was obtained for treatment procedures. The patient understood the discussion of treatment and procedures well. All questions were answered thoroughly reviewed. Debridement of mycotic and hypertrophic toenails, 1 through 5 bilateral and clearing of subungual debris. No ulceration, no infection noted.  Return Visit-Office Procedure: Patient instructed to return to the office for a follow up visit 3 months for continued evaluation and treatment.  Boneta Lucks, DPM    No follow-ups on file.

## 2021-03-26 DIAGNOSIS — Z23 Encounter for immunization: Secondary | ICD-10-CM | POA: Diagnosis not present

## 2021-04-07 ENCOUNTER — Telehealth: Payer: Self-pay | Admitting: Neurology

## 2021-04-07 NOTE — Telephone Encounter (Signed)
Faxed again to [phone].

## 2021-04-14 DIAGNOSIS — R2689 Other abnormalities of gait and mobility: Secondary | ICD-10-CM | POA: Diagnosis not present

## 2021-04-14 DIAGNOSIS — G20 Parkinson's disease: Secondary | ICD-10-CM | POA: Diagnosis not present

## 2021-04-16 DIAGNOSIS — R2689 Other abnormalities of gait and mobility: Secondary | ICD-10-CM | POA: Diagnosis not present

## 2021-04-16 DIAGNOSIS — G20 Parkinson's disease: Secondary | ICD-10-CM | POA: Diagnosis not present

## 2021-04-20 DIAGNOSIS — G20 Parkinson's disease: Secondary | ICD-10-CM | POA: Diagnosis not present

## 2021-04-20 DIAGNOSIS — R2689 Other abnormalities of gait and mobility: Secondary | ICD-10-CM | POA: Diagnosis not present

## 2021-04-22 DIAGNOSIS — R2689 Other abnormalities of gait and mobility: Secondary | ICD-10-CM | POA: Diagnosis not present

## 2021-04-22 DIAGNOSIS — G20 Parkinson's disease: Secondary | ICD-10-CM | POA: Diagnosis not present

## 2021-04-28 DIAGNOSIS — R109 Unspecified abdominal pain: Secondary | ICD-10-CM | POA: Diagnosis not present

## 2021-04-29 DIAGNOSIS — R2689 Other abnormalities of gait and mobility: Secondary | ICD-10-CM | POA: Diagnosis not present

## 2021-04-29 DIAGNOSIS — G20 Parkinson's disease: Secondary | ICD-10-CM | POA: Diagnosis not present

## 2021-04-30 DIAGNOSIS — R2689 Other abnormalities of gait and mobility: Secondary | ICD-10-CM | POA: Diagnosis not present

## 2021-04-30 DIAGNOSIS — G20 Parkinson's disease: Secondary | ICD-10-CM | POA: Diagnosis not present

## 2021-05-01 ENCOUNTER — Other Ambulatory Visit: Payer: Self-pay | Admitting: Neurology

## 2021-05-04 DIAGNOSIS — G20 Parkinson's disease: Secondary | ICD-10-CM | POA: Diagnosis not present

## 2021-05-04 DIAGNOSIS — R2689 Other abnormalities of gait and mobility: Secondary | ICD-10-CM | POA: Diagnosis not present

## 2021-05-11 DIAGNOSIS — R2689 Other abnormalities of gait and mobility: Secondary | ICD-10-CM | POA: Diagnosis not present

## 2021-05-11 DIAGNOSIS — G20 Parkinson's disease: Secondary | ICD-10-CM | POA: Diagnosis not present

## 2021-05-13 DIAGNOSIS — G20 Parkinson's disease: Secondary | ICD-10-CM | POA: Diagnosis not present

## 2021-05-13 DIAGNOSIS — R2689 Other abnormalities of gait and mobility: Secondary | ICD-10-CM | POA: Diagnosis not present

## 2021-06-01 ENCOUNTER — Telehealth: Payer: Self-pay | Admitting: *Deleted

## 2021-06-01 NOTE — Telephone Encounter (Signed)
Ortho bundle 1 year post op call completed. ?

## 2021-06-04 ENCOUNTER — Encounter: Payer: Self-pay | Admitting: Orthopaedic Surgery

## 2021-06-04 ENCOUNTER — Ambulatory Visit (INDEPENDENT_AMBULATORY_CARE_PROVIDER_SITE_OTHER): Payer: Medicare Other

## 2021-06-04 ENCOUNTER — Ambulatory Visit (INDEPENDENT_AMBULATORY_CARE_PROVIDER_SITE_OTHER): Payer: Medicare Other | Admitting: Orthopaedic Surgery

## 2021-06-04 DIAGNOSIS — M545 Low back pain, unspecified: Secondary | ICD-10-CM

## 2021-06-04 DIAGNOSIS — M4316 Spondylolisthesis, lumbar region: Secondary | ICD-10-CM | POA: Diagnosis not present

## 2021-06-04 DIAGNOSIS — M4807 Spinal stenosis, lumbosacral region: Secondary | ICD-10-CM

## 2021-06-04 DIAGNOSIS — G8929 Other chronic pain: Secondary | ICD-10-CM | POA: Diagnosis not present

## 2021-06-04 DIAGNOSIS — Z96652 Presence of left artificial knee joint: Secondary | ICD-10-CM | POA: Diagnosis not present

## 2021-06-04 NOTE — Progress Notes (Signed)
Office Visit Note   Patient: [name]           Date of Birth: Apr 26, 1941           MRN: [id number] Visit Date: 06/04/2021              Requested by: Ginger Organ., MD [address] [place],  [place] [address] PCP: Ginger Organ., MD   Assessment & Plan: Visit Diagnoses:  1. Chronic bilateral low back pain, unspecified whether sciatica present   2. Spinal stenosis of lumbosacral region   3. Spondylolisthesis, lumbar region   4. History of left knee replacement     Plan: I did go over her back x-rays in detail.  She will continue her Tylenol arthritis and can try over-the-counter Voltaren gel which I think would be safer for her in light of her being on Eliquis.  All questions and concerns were answered and addressed.  Follow-up will be as needed.  Follow-Up Instructions: Return if symptoms worsen or fail to improve.   Orders:  Orders Placed This Encounter  Procedures   XR Lumbar Spine 2-3 Views   No orders of the defined types were placed in this encounter.     Procedures: No procedures performed   Clinical Data: No additional findings.   Subjective: Chief Complaint  Patient presents with   Lower Back - Pain  The patient is well-known to me.  She is actually a year out from a left total knee arthroplasty.  She is 80 years old.  She comes in today mainly with a chief complaint of chronic low back pain.  She actually has a history of lumbar spine surgery done by someone who has since retired.  There is no instrumentation in her back.  She ambulates with a rolling walker.  She does have daily low back pain but no radicular components.  She says her total knee is doing well.  She is on blood thinning medication so she cannot take an anti-inflammatory.  She does take Tylenol that helps some.  She has never tried Voltaren gel.  She has been through therapy before and said that does not really help her much.  She is not interested in any type of surgical  referral.  HPI  Review of Systems She currently denies a headache, chest pain, shortness of breath, fever, chills, nausea, vomiting.  She denies any change in bowel bladder function.  Objective: Vital Signs: There were no vitals taken for this visit.  Physical Exam She is alert and orient x3 and in no acute distress Ortho Exam Flexion and extension of her lumbar spine causes pain across the lower lumbar spine.  She hurts in the paraspinal muscles in the midline of her lumbar spine.  There is no radicular component of this at all.  She has excellent range of motion of her left total knee arthroplasty and it does feel stable. Specialty Comments:  No specialty comments available.  Imaging: XR Lumbar Spine 2-3 Views  Result Date: 06/04/2021 2 views of the lumbar spine show a grade 1 spondylolisthesis between L4 and L5 that is close to grade 2.  There is significant degenerative changes in the posterior elements.    PMFS History: Patient Active Problem List   Diagnosis Date Noted   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/06/2020   Unilateral primary osteoarthritis, left knee 02/21/2020   Depression 05/25/2019   Skin cancer 05/25/2019   Metatarsalgia of right foot 08/04/2018  Pulmonary embolus ([place]) 05/19/2018   Pulmonary embolism ([place]) 05/18/2018   HTN (hypertension) 05/18/2018   Lumbar radiculopathy 11/13/2016   Hallux rigidus of right foot 05/19/2016   Onychomycosis of left great toe 05/19/2016   Gait disturbance 04/05/2016   Lumbar spinal stenosis 02/20/2016   Claudication ([place]) 02/03/2016   Ectropion of both eyes 04/07/2015   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   Vitreous syneresis of both eyes 04/07/2015   Sleep apnea, obstructive 07/29/2014   Acute laryngitis 07/24/2014   Allergic rhinitis 07/24/2014   Hearing loss 07/24/2014   Hip osteoarthritis 07/24/2014   Hoarseness 07/24/2014   Lumbar degenerative disc disease 07/24/2014   Lumbar spondylosis 07/24/2014    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   Urinary incontinence in female 07/24/2014   Epiphora due to insufficient drainage, bilateral 05/23/2014   Essential tremor 04/24/2014   Conjunctivochalasis 03/09/2012   Dermatochalasis 03/09/2012   Hyperopia with astigmatism and presbyopia 03/09/2012   Pinguecula of both eyes 03/09/2012   Past Medical History:  Diagnosis Date   Arthritis    arthritis in joints and back   Benign familial tremor    Clostridium difficile infection    DDD (degenerative disc disease), lumbar    Diverticulosis    DVT (deep venous thrombosis) (HCC)    left leg   Gallstones    GERD (gastroesophageal reflux disease)    Hypertension    Osteoporosis    Wears glasses     Family History  Problem Relation Age of Onset   Other Mother        sarcoma   Other Father        broken hip   Dementia Father    Pneumonia Father     Past Surgical History:  Procedure Laterality Date   ABDOMINAL HYSTERECTOMY  1983   arterial vascular fistula  2004   coiled at [place]  right   CHOLECYSTECTOMY N/A 03/29/2013   Procedure: LAPAROSCOPIC CHOLECYSTECTOMY WITH INTRAOPERATIVE CHOLANGIOGRAM;  Surgeon: Haywood Lasso, MD;  Location: [facility];  Service: General;  Laterality: N/A;   EUS N/A 03/09/2013   Procedure: UPPER ENDOSCOPIC ULTRASOUND (EUS) LINEAR;  Surgeon: Beryle Beams, MD;  Location: [facility];  Service: Endoscopy;  Laterality: N/A;   LUMBAR LAMINECTOMY/DECOMPRESSION MICRODISCECTOMY  11/30/2012   Procedure: LUMBAR LAMINECTOMY/DECOMPRESSION MICRODISCECTOMY 2 LEVELS;  Surgeon: Magnus Sinning, MD;  Location: [facility];  Service: Orthopedics;  Laterality: N/A;  DECOMPRESSION LAMINECTOMY L4-L5, L5-S1 CENTRAL    ROTATOR CUFF REPAIR Bilateral 2004   two on right, one on left   TOTAL KNEE ARTHROPLASTY Left 06/06/2020   Procedure: LEFT TOTAL KNEE ARTHROPLASTY;  Surgeon: [name], MD;  Location: [facility];  Service: Orthopedics;  Laterality: Left;   TRIGGER FINGER RELEASE Right     hand   Social History   Occupational History   Occupation: Futures trader    Comment: partially retired  Tobacco Use   Smoking status: Former    Packs/day: 0.50    Pack years: 0.00    Types: Cigarettes    Quit date: 07/11/1970    Years since quitting: 50.9   Smokeless tobacco: Never  Vaping Use   Vaping Use: Never used  Substance and Sexual Activity   Alcohol use: Yes    Alcohol/week: 0.0 standard drinks    Comment: wine occasionally   Drug use: No   Sexual activity: Not Currently

## 2021-06-19 ENCOUNTER — Telehealth: Payer: Self-pay

## 2021-06-19 ENCOUNTER — Encounter: Payer: Self-pay | Admitting: Podiatry

## 2021-06-19 ENCOUNTER — Other Ambulatory Visit: Payer: Self-pay

## 2021-06-19 ENCOUNTER — Ambulatory Visit (INDEPENDENT_AMBULATORY_CARE_PROVIDER_SITE_OTHER): Payer: Medicare Other | Admitting: Podiatry

## 2021-06-19 ENCOUNTER — Telehealth: Payer: Self-pay | Admitting: Neurology

## 2021-06-19 DIAGNOSIS — M79674 Pain in right toe(s): Secondary | ICD-10-CM | POA: Diagnosis not present

## 2021-06-19 DIAGNOSIS — D689 Coagulation defect, unspecified: Secondary | ICD-10-CM | POA: Diagnosis not present

## 2021-06-19 DIAGNOSIS — M79675 Pain in left toe(s): Secondary | ICD-10-CM

## 2021-06-19 DIAGNOSIS — B351 Tinea unguium: Secondary | ICD-10-CM

## 2021-06-19 NOTE — Telephone Encounter (Signed)
Patient called and said she is having a hard time speaking and moving two of her fingers on her right hand. It has been going on for about a week. Today, she said she cannot move two fingers on her right hand.  Transferred call directly to [facility].

## 2021-06-19 NOTE — Telephone Encounter (Signed)
Patient called in and stated that she is having trouble with her tremors and that they have became progressively worse over the last week or so. Patient stated that recently she has been unable to use two of her fingers on her right hand to pick up puzzle pieces or to drink a cup of tea.  While speaking to patient she sounded like she was having difficulty speaking and getting certain words out, patient also paused in between words to cough and made choking sounds as she was trying to speak to me.   Based off of how she sounded and new onset of nothing being able to use her fingers I advised her to go straight to the ER to be evaluated asap. Patient is aware that Dr. Carles Collet is on vacation and that I would send a note to the covering providers to let them know what is going on.   Patient informed me that she did not want to call EMS because she doesn't want to pay for the ambulance ride, and informed me that she would find someone to take her. I advised patient again to go to the hospital as soon as possible and to call EMS if she is unable to find someone. Patient verbalized understanding and had no further questions.

## 2021-06-19 NOTE — Progress Notes (Signed)
  Subjective:  Patient ID: [name], female    DOB: 05-Feb-1941,  MRN: [id number]  Chief Complaint  Patient presents with   Nail Problem    Nail trim    80 y.o. female returns for the above complaint.  Patient presents with a follow-up with thickened elongated mycotic toenails that has been causing a lot of pain.  Patient has not been able to debride them.  She states that she would like to have them debrided is causing her pain when ambulating.  She denies any other acute complaints. s.  Objective:  There were no vitals filed for this visit. Podiatric Exam: Vascular: dorsalis pedis and posterior tibial pulses are palpable bilateral. Capillary return is immediate. Temperature gradient is WNL. Skin turgor WNL  Sensorium: Normal Semmes Weinstein monofilament test. Normal tactile sensation bilaterally. Nail Exam: Pt has thick disfigured discolored nails with subungual debris noted bilateral entire nail hallux through fifth toenails. Pain on palpation of the entire/total nail on 1st digit of the left Ulcer Exam: There is no evidence of ulcer or pre-ulcerative changes or infection. Orthopedic Exam: Muscle tone and strength are WNL. No limitations in general ROM.  No further crepitus noted at the first metatarsophalangeal joint with range of motion active and passive to the right foot.  No further intra-articular pain noted to the right first metatarsophalangeal joint.  No extensor or flexor tendinitis noted.  HAV  B/L.  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 Skin: No Porokeratosis. No infection or ulcers   Assessment:   1. Pain due to onychomycosis of toenails of both feet   2. 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  Nail contusion/dystrophy hallux, left -Clinically healed  No follow-ups on file.   Right first metatarsophalangeal joint arthritis/capsulitis -Clinically resolved with one steroid injection.  Onychomycosis with pain   -Nails palliatively debrided as below. -Educated on self-care  Procedure: Nail Debridement Rationale: pain  Type of Debridement: manual, sharp debridement. Instrumentation: Nail nipper, rotary burr. Number of Nails: Nine  Procedures and Treatment: Consent by patient was obtained for treatment procedures. The patient understood the discussion of treatment and procedures well. All questions were answered thoroughly reviewed. Debridement of mycotic and hypertrophic toenails, 1 through 5 bilateral and clearing of subungual debris. No ulceration, no infection noted.  Return Visit-Office Procedure: Patient instructed to return to the office for a follow up visit 3 months for continued evaluation and treatment.  Boneta Lucks, DPM    No follow-ups on file.

## 2021-06-22 NOTE — Telephone Encounter (Signed)
See other telephone encounter.

## 2021-06-26 NOTE — Telephone Encounter (Signed)
Will you call patient and check on her since I see no note regarding ER visit? Increase carbidopa/levodopa 25/100, to 1.5 tablets at 8am/noon and 4pm.  Please fax that order to [facility] where she lives

## 2021-06-26 NOTE — Telephone Encounter (Signed)
Called patient  and checked on her since I see no note regarding ER visit. Patient stated that she did not go to the ER. I asked patient how she was doing and she stated "I am doing ok." I advised patient of Dr. Doristine Devoid recommendations to Increase carbidopa/levodopa 25/100, to 1.5 tablets at 8am/noon and 4pm.  Patient stated that she would like to wait until her appt in October with Dr. Carles Collet because she feels as if her tremors aren't the issue and just wants to wait to see Dr. Carles Collet in person. Patient said her appt is so far away, I transferred patient to the front to be added to Dr. Doristine Devoid waitlist. Patient was advised to call if she changes her mind and wants to adjust medication.

## 2021-08-05 ENCOUNTER — Other Ambulatory Visit: Payer: Self-pay | Admitting: Neurology

## 2021-08-12 DIAGNOSIS — R7301 Impaired fasting glucose: Secondary | ICD-10-CM | POA: Diagnosis not present

## 2021-08-12 DIAGNOSIS — I10 Essential (primary) hypertension: Secondary | ICD-10-CM | POA: Diagnosis not present

## 2021-08-12 DIAGNOSIS — M810 Age-related osteoporosis without current pathological fracture: Secondary | ICD-10-CM | POA: Diagnosis not present

## 2021-08-18 DIAGNOSIS — M810 Age-related osteoporosis without current pathological fracture: Secondary | ICD-10-CM | POA: Diagnosis not present

## 2021-08-18 DIAGNOSIS — Z7189 Other specified counseling: Secondary | ICD-10-CM | POA: Diagnosis not present

## 2021-08-18 DIAGNOSIS — I10 Essential (primary) hypertension: Secondary | ICD-10-CM | POA: Diagnosis not present

## 2021-08-18 DIAGNOSIS — R82998 Other abnormal findings in urine: Secondary | ICD-10-CM | POA: Diagnosis not present

## 2021-08-18 DIAGNOSIS — F3342 Major depressive disorder, recurrent, in full remission: Secondary | ICD-10-CM | POA: Diagnosis not present

## 2021-08-18 DIAGNOSIS — Z23 Encounter for immunization: Secondary | ICD-10-CM | POA: Diagnosis not present

## 2021-08-18 DIAGNOSIS — Z7901 Long term (current) use of anticoagulants: Secondary | ICD-10-CM | POA: Diagnosis not present

## 2021-08-18 DIAGNOSIS — M4800 Spinal stenosis, site unspecified: Secondary | ICD-10-CM | POA: Diagnosis not present

## 2021-08-18 DIAGNOSIS — G3184 Mild cognitive impairment, so stated: Secondary | ICD-10-CM | POA: Diagnosis not present

## 2021-08-18 DIAGNOSIS — Z0000 Encounter for general adult medical examination without abnormal findings: Secondary | ICD-10-CM | POA: Diagnosis not present

## 2021-08-18 DIAGNOSIS — D6869 Other thrombophilia: Secondary | ICD-10-CM | POA: Diagnosis not present

## 2021-08-18 DIAGNOSIS — G20 Parkinson's disease: Secondary | ICD-10-CM | POA: Diagnosis not present

## 2021-08-18 DIAGNOSIS — Z1331 Encounter for screening for depression: Secondary | ICD-10-CM | POA: Diagnosis not present

## 2021-08-18 DIAGNOSIS — R2681 Unsteadiness on feet: Secondary | ICD-10-CM | POA: Diagnosis not present

## 2021-08-25 DIAGNOSIS — M6281 Muscle weakness (generalized): Secondary | ICD-10-CM | POA: Diagnosis not present

## 2021-08-25 DIAGNOSIS — R1310 Dysphagia, unspecified: Secondary | ICD-10-CM | POA: Diagnosis not present

## 2021-08-25 DIAGNOSIS — G20 Parkinson's disease: Secondary | ICD-10-CM | POA: Diagnosis not present

## 2021-08-27 DIAGNOSIS — Z23 Encounter for immunization: Secondary | ICD-10-CM | POA: Diagnosis not present

## 2021-08-28 DIAGNOSIS — G20 Parkinson's disease: Secondary | ICD-10-CM | POA: Diagnosis not present

## 2021-08-28 DIAGNOSIS — M6281 Muscle weakness (generalized): Secondary | ICD-10-CM | POA: Diagnosis not present

## 2021-08-28 DIAGNOSIS — R1310 Dysphagia, unspecified: Secondary | ICD-10-CM | POA: Diagnosis not present

## 2021-08-29 ENCOUNTER — Other Ambulatory Visit: Payer: Self-pay | Admitting: Neurology

## 2021-08-29 DIAGNOSIS — G20 Parkinson's disease: Secondary | ICD-10-CM

## 2021-08-31 ENCOUNTER — Other Ambulatory Visit: Payer: Self-pay

## 2021-09-01 DIAGNOSIS — R1310 Dysphagia, unspecified: Secondary | ICD-10-CM | POA: Diagnosis not present

## 2021-09-01 DIAGNOSIS — M6281 Muscle weakness (generalized): Secondary | ICD-10-CM | POA: Diagnosis not present

## 2021-09-01 DIAGNOSIS — G20 Parkinson's disease: Secondary | ICD-10-CM | POA: Diagnosis not present

## 2021-09-04 DIAGNOSIS — M6281 Muscle weakness (generalized): Secondary | ICD-10-CM | POA: Diagnosis not present

## 2021-09-04 DIAGNOSIS — G20 Parkinson's disease: Secondary | ICD-10-CM | POA: Diagnosis not present

## 2021-09-04 DIAGNOSIS — Z961 Presence of intraocular lens: Secondary | ICD-10-CM | POA: Diagnosis not present

## 2021-09-04 DIAGNOSIS — H5203 Hypermetropia, bilateral: Secondary | ICD-10-CM | POA: Diagnosis not present

## 2021-09-04 DIAGNOSIS — R1310 Dysphagia, unspecified: Secondary | ICD-10-CM | POA: Diagnosis not present

## 2021-09-04 DIAGNOSIS — H3500 Unspecified background retinopathy: Secondary | ICD-10-CM | POA: Diagnosis not present

## 2021-09-08 DIAGNOSIS — M6281 Muscle weakness (generalized): Secondary | ICD-10-CM | POA: Diagnosis not present

## 2021-09-08 DIAGNOSIS — R1310 Dysphagia, unspecified: Secondary | ICD-10-CM | POA: Diagnosis not present

## 2021-09-08 DIAGNOSIS — G20 Parkinson's disease: Secondary | ICD-10-CM | POA: Diagnosis not present

## 2021-09-09 NOTE — Progress Notes (Signed)
Assessment/Plan:   1.  ET/PD  -increase carbidopa/levodopa 25/100 to 2 tablets at 9am/2 tablets at 1pm/1 tablet at 5pm  -Continue propranolol, 20 mg twice per day  -DaTscan was abnormal in October, 2021.  -currently in ST/PT in therapy but asks for new order.  Provided today  -order MBE for dysphagia  -discussed cala trio/weighted gloves.  Son is going to look into those.  2.  Recurrent DVT/PE  -On Eliquis  3.  RBD  -add melatonin, 3 mg nightly  -Hold Tylenol PM.  -lexapro may contribute    Subjective:   [name] was seen today in follow up for ET/Parkinsons disease.  My previous records were reviewed prior to todays visit as well as outside records available to me.  She called since our last visit stating that she was having trouble with her tremor and that they were getting worse.  As a side note of that telephone call, she also stated that she was having trouble using her fingers and picking up puzzle pieces because the fingers would not work.  When we called her back, my medical assistant noted significant dysarthria and patient was coughing/choking while speaking.  I told the patient to go to the emergency room immediately.  Patient declined stating that she did not want to pay for the ambulance ride, but did state that she would find somebody to take her.  Ultimately, she did not go.  we called her to check up on her, she stated that she was feeling okay and back to baseline.  We did tell her that she could try increasing her levodopa to 1.5 tablets daily, but she declined that.  My medical assistant did tell me on the follow-up call, the patient sounded much better.  Pts son returns with her today and supplements the history.  Son has not been here before.  Takes notes and ask questions.  Son states that pt notes she is more aware of tremors, not sure that they have gotten worse.  Noting more significant dreams.  Patient may be acting out the dreams -found herself at the  door [address] and not sure how she got there.  More trouble trouble with swallowing.  Having ST currently.  No falls - but having balance issues.  Still living independently.    Current prescribed movement disorder medications: Carbidopa/levodopa 25/100, 1 tablet 3 times per day Propranolol 20 mg twice per day   PREVIOUS MEDICATIONS: Topiramate, memory change  ALLERGIES:   Allergies  Allergen Reactions   Buprenorphine Hcl Nausea And Vomiting   Codeine Nausea And Vomiting    Other reaction(s): Other (See Comments) But tolerates Tramadol But tolerates Tramadol Vomiting    Morphine And Related Nausea And Vomiting   Morphine     Other reaction(s): Other (See Comments) Vomiting   Procaine     Other reaction(s): Other (See Comments) Other reaction(s): Shakes Novacaine-Shakes    CURRENT MEDICATIONS:  Outpatient Encounter Medications as of 09/16/2021  Medication Sig   amLODipine (NORVASC) 2.5 MG tablet Take 2.5 mg by mouth daily.    apixaban (ELIQUIS) 5 MG TABS tablet Take 1 tablet (5 mg total) by mouth 2 (two) times daily.   busPIRone (BUSPAR) 10 MG tablet Take 10 mg by mouth 2 (two) times daily.    carbidopa-levodopa (SINEMET IR) 25-100 MG tablet TAKE ONE TABLET BY MOUTH THREE TIMES A DAY *ONE AT 8AM, NOON, AND 4PM*   cholecalciferol (VITAMIN D) 25 MCG (1000 UNIT) tablet Take 1,000 Units  by mouth daily.   donepezil (ARICEPT) 10 MG tablet Take 10 mg by mouth at bedtime.   escitalopram (LEXAPRO) 20 MG tablet Take 20 mg by mouth daily.    esomeprazole (NEXIUM) 40 MG capsule Take 40 mg by mouth 2 (two) times daily before a meal.    loratadine (CLARITIN) 10 MG tablet Take 10 mg by mouth at bedtime.   mirabegron ER (MYRBETRIQ) 50 MG TB24 tablet Take 50 mg by mouth at bedtime.    nitrofurantoin (MACRODANTIN) 100 MG capsule Take 100 mg by mouth in the morning.   olmesartan (BENICAR) 20 MG tablet Take 20 mg by mouth in the morning and at bedtime.    propranolol (INDERAL) 20 MG tablet TAKE 1  TABLET BY MOUTH TWO TIMES A DAY   TOVIAZ 8 MG TB24 tablet Take 8 mg by mouth at bedtime.    vitamin B-12 (CYANOCOBALAMIN) 1000 MCG tablet Take 1,000 mcg by mouth daily.   acetaminophen (TYLENOL) 500 MG tablet Take 500 mg by mouth every 6 (six) hours as needed (for pain.).  (Patient not taking: Reported on 09/16/2021)   saccharomyces boulardii (FLORASTOR) 250 MG capsule Take 250 mg by mouth 2 (two) times daily. (Patient not taking: Reported on 09/16/2021)   [DISCONTINUED] carvedilol (COREG) 6.25 MG tablet    No facility-administered encounter medications on file as of 09/16/2021.    Objective:   PHYSICAL EXAMINATION:    VITALS:   Vitals:   09/16/21 1105  BP: (!) 142/70  Weight: 213 lb 6.4 oz (96.8 kg)  Height: 5\' 1"  (1.549 m)     GEN:  The patient appears stated age and is in NAD. HEENT:  Normocephalic, atraumatic.  The mucous membranes are moist.   Neurological examination:  Orientation: The patient is alert and oriented x3. Cranial nerves: There is good facial symmetry without facial hypomimia. The speech is fluent and clear. Soft palate rises symmetrically and there is no tongue deviation. Hearing is intact to conversational tone. Sensation: Sensation is intact to light touch throughout Motor: Strength is at least antigravity x4.  Movement examination: Tone: There is nl tone in the UE/LE Abnormal movements: there is rest tremor, very mild, bilaterally, right greater than left Coordination:  There is min decremation with RAM's, on the right Gait and Station: The patient has mild difficulty arising out of a deep-seated chair without the use of the hands. The patients gait is wide-based and somewhat antalgic.  She is short stepped but does not shuffle.  This is true with the walker and without the walker.  I have reviewed and interpreted the following labs independently    Chemistry      Component Value Date/Time   NA 132 (L) 06/07/2020 0332   K 3.9 06/07/2020 0332   CL 102  06/07/2020 0332   CO2 23 06/07/2020 0332   BUN 15 06/07/2020 0332   CREATININE 0.54 06/07/2020 0332      Component Value Date/Time   CALCIUM 8.1 (L) 06/07/2020 0332   ALKPHOS 49 01/10/2019 2254   AST 14 (L) 01/10/2019 2254   ALT 12 01/10/2019 2254   BILITOT 0.4 01/10/2019 2254       Lab Results  Component Value Date   WBC 12.5 (H) 06/07/2020   HGB 11.5 (L) 06/07/2020   HCT 34.8 (L) 06/07/2020   MCV 96.1 06/07/2020   PLT 183 06/07/2020    Lab Results  Component Value Date   TSH 1.749 05/18/2018     Total time spent on today's  visit was 45 minutes, including both face-to-face time and nonface-to-face time.  Time included that spent on review of records (prior notes available to me/labs/imaging if pertinent), discussing treatment and goals, answering patient's questions and coordinating care.  Cc:  Ginger Organ., MD

## 2021-09-11 DIAGNOSIS — M6281 Muscle weakness (generalized): Secondary | ICD-10-CM | POA: Diagnosis not present

## 2021-09-11 DIAGNOSIS — G20 Parkinson's disease: Secondary | ICD-10-CM | POA: Diagnosis not present

## 2021-09-11 DIAGNOSIS — R1310 Dysphagia, unspecified: Secondary | ICD-10-CM | POA: Diagnosis not present

## 2021-09-15 DIAGNOSIS — M6281 Muscle weakness (generalized): Secondary | ICD-10-CM | POA: Diagnosis not present

## 2021-09-15 DIAGNOSIS — G20 Parkinson's disease: Secondary | ICD-10-CM | POA: Diagnosis not present

## 2021-09-16 ENCOUNTER — Other Ambulatory Visit: Payer: Self-pay

## 2021-09-16 ENCOUNTER — Ambulatory Visit (INDEPENDENT_AMBULATORY_CARE_PROVIDER_SITE_OTHER): Payer: Medicare Other | Admitting: Neurology

## 2021-09-16 VITALS — BP 142/70 | Ht 61.0 in | Wt 213.4 lb

## 2021-09-16 DIAGNOSIS — G250 Essential tremor: Secondary | ICD-10-CM | POA: Diagnosis not present

## 2021-09-16 DIAGNOSIS — G20 Parkinson's disease: Secondary | ICD-10-CM | POA: Diagnosis not present

## 2021-09-16 DIAGNOSIS — G4752 REM sleep behavior disorder: Secondary | ICD-10-CM

## 2021-09-16 DIAGNOSIS — R1310 Dysphagia, unspecified: Secondary | ICD-10-CM | POA: Diagnosis not present

## 2021-09-16 MED ORDER — CARBIDOPA-LEVODOPA 25-100 MG PO TABS: ORAL_TABLET | ORAL | 1 refills | Status: DC

## 2021-09-16 NOTE — Patient Instructions (Addendum)
2 tablets at 9am/2 tablets at 1pm/1 tablet at 5pm. As a reminder, carbidopa/levodopa can be taken at the same time as a carbohydrate, but we like to have you take your pill either 30 minutes before a protein source or 1 hour after as protein can interfere with carbidopa/levodopa absorption. Take melatonin 3 mg at bed We will send an order for a swallow study You can look into weighted gloves.  You can also look into the [place] trio device and if interested, we can send a RX

## 2021-09-18 ENCOUNTER — Other Ambulatory Visit (HOSPITAL_COMMUNITY): Payer: Self-pay

## 2021-09-18 DIAGNOSIS — G20 Parkinson's disease: Secondary | ICD-10-CM | POA: Diagnosis not present

## 2021-09-18 DIAGNOSIS — R1310 Dysphagia, unspecified: Secondary | ICD-10-CM

## 2021-09-18 DIAGNOSIS — M6281 Muscle weakness (generalized): Secondary | ICD-10-CM | POA: Diagnosis not present

## 2021-09-18 DIAGNOSIS — R059 Cough, unspecified: Secondary | ICD-10-CM

## 2021-09-22 ENCOUNTER — Other Ambulatory Visit: Payer: Self-pay

## 2021-09-22 ENCOUNTER — Ambulatory Visit (HOSPITAL_COMMUNITY)
Admission: RE | Admit: 2021-09-22 | Discharge: 2021-09-22 | Disposition: A | Discharge status: Home / Self Care | Payer: Medicare Other | Source: Ambulatory Visit | Attending: Neurology | Admitting: Neurology

## 2021-09-22 DIAGNOSIS — R059 Cough, unspecified: Secondary | ICD-10-CM | POA: Diagnosis not present

## 2021-09-22 DIAGNOSIS — R1310 Dysphagia, unspecified: Secondary | ICD-10-CM | POA: Diagnosis not present

## 2021-09-22 DIAGNOSIS — G20 Parkinson's disease: Secondary | ICD-10-CM | POA: Diagnosis not present

## 2021-09-22 DIAGNOSIS — M6281 Muscle weakness (generalized): Secondary | ICD-10-CM | POA: Diagnosis not present

## 2021-09-25 ENCOUNTER — Ambulatory Visit: Payer: Medicare Other | Admitting: Podiatry

## 2021-09-25 DIAGNOSIS — G20 Parkinson's disease: Secondary | ICD-10-CM | POA: Diagnosis not present

## 2021-09-25 DIAGNOSIS — M6281 Muscle weakness (generalized): Secondary | ICD-10-CM | POA: Diagnosis not present

## 2021-09-29 DIAGNOSIS — M6281 Muscle weakness (generalized): Secondary | ICD-10-CM | POA: Diagnosis not present

## 2021-09-29 DIAGNOSIS — G20 Parkinson's disease: Secondary | ICD-10-CM | POA: Diagnosis not present

## 2021-10-02 ENCOUNTER — Other Ambulatory Visit: Payer: Self-pay

## 2021-10-02 ENCOUNTER — Ambulatory Visit (INDEPENDENT_AMBULATORY_CARE_PROVIDER_SITE_OTHER): Payer: Medicare Other | Admitting: Podiatry

## 2021-10-02 DIAGNOSIS — M6281 Muscle weakness (generalized): Secondary | ICD-10-CM | POA: Diagnosis not present

## 2021-10-02 DIAGNOSIS — G20 Parkinson's disease: Secondary | ICD-10-CM | POA: Diagnosis not present

## 2021-10-02 DIAGNOSIS — M79674 Pain in right toe(s): Secondary | ICD-10-CM | POA: Diagnosis not present

## 2021-10-02 DIAGNOSIS — M79675 Pain in left toe(s): Secondary | ICD-10-CM

## 2021-10-02 DIAGNOSIS — D689 Coagulation defect, unspecified: Secondary | ICD-10-CM

## 2021-10-02 DIAGNOSIS — B351 Tinea unguium: Secondary | ICD-10-CM | POA: Diagnosis not present

## 2021-10-05 ENCOUNTER — Telehealth: Payer: Self-pay | Admitting: Neurology

## 2021-10-05 NOTE — Telephone Encounter (Signed)
Called patient and informed her that Dr. Carles Collet is ok with her taking the Tylenol PM. I informed patient that she can discuss it more with Dr. Carles Collet at her f/u. Patient verbalized understanding and had no further questions or concerns and thanked me for the call.

## 2021-10-05 NOTE — Telephone Encounter (Signed)
Pt took Tat's advice and stopped taking tylenol pm at night for 15 days. She did not sleep at all. Started back taking tylenol for 4 nights and has slept. She said she has to sleep, any advice is helpful. Will this hurt her if she continues to take it.

## 2021-10-08 NOTE — Progress Notes (Signed)
  Subjective:  Patient ID: [name], female    DOB: 05-Feb-1941,  MRN: [id number]  Chief Complaint  Patient presents with   Nail Problem    Nail trim    80 y.o. female returns for the above complaint.  Patient presents with a follow-up with thickened elongated mycotic toenails that has been causing a lot of pain.  Patient has not been able to debride them.  She states that she would like to have them debrided is causing her pain when ambulating.  She denies any other acute complaints. s.  Objective:  There were no vitals filed for this visit. Podiatric Exam: Vascular: dorsalis pedis and posterior tibial pulses are palpable bilateral. Capillary return is immediate. Temperature gradient is WNL. Skin turgor WNL  Sensorium: Normal Semmes Weinstein monofilament test. Normal tactile sensation bilaterally. Nail Exam: Pt has thick disfigured discolored nails with subungual debris noted bilateral entire nail hallux through fifth toenails. Pain on palpation of the entire/total nail on 1st digit of the left Ulcer Exam: There is no evidence of ulcer or pre-ulcerative changes or infection. Orthopedic Exam: Muscle tone and strength are WNL. No limitations in general ROM.  No further crepitus noted at the first metatarsophalangeal joint with range of motion active and passive to the right foot.  No further intra-articular pain noted to the right first metatarsophalangeal joint.  No extensor or flexor tendinitis noted.  HAV  B/L.  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 Skin: No Porokeratosis. No infection or ulcers   Assessment:   1. Pain due to onychomycosis of toenails of both feet   2. 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  Nail contusion/dystrophy hallux, left -Clinically healed  No follow-ups on file.   Right first metatarsophalangeal joint arthritis/capsulitis -Clinically resolved with one steroid injection.  Onychomycosis with pain   -Nails palliatively debrided as below. -Educated on self-care  Procedure: Nail Debridement Rationale: pain  Type of Debridement: manual, sharp debridement. Instrumentation: Nail nipper, rotary burr. Number of Nails: Nine  Procedures and Treatment: Consent by patient was obtained for treatment procedures. The patient understood the discussion of treatment and procedures well. All questions were answered thoroughly reviewed. Debridement of mycotic and hypertrophic toenails, 1 through 5 bilateral and clearing of subungual debris. No ulceration, no infection noted.  Return Visit-Office Procedure: Patient instructed to return to the office for a follow up visit 3 months for continued evaluation and treatment.  Boneta Lucks, DPM    No follow-ups on file.

## 2021-10-09 DIAGNOSIS — M6281 Muscle weakness (generalized): Secondary | ICD-10-CM | POA: Diagnosis not present

## 2021-10-09 DIAGNOSIS — G20 Parkinson's disease: Secondary | ICD-10-CM | POA: Diagnosis not present

## 2021-10-09 DIAGNOSIS — M791 Myalgia, unspecified site: Secondary | ICD-10-CM | POA: Diagnosis not present

## 2021-10-10 DIAGNOSIS — G20 Parkinson's disease: Secondary | ICD-10-CM | POA: Diagnosis not present

## 2021-10-10 DIAGNOSIS — M6281 Muscle weakness (generalized): Secondary | ICD-10-CM | POA: Diagnosis not present

## 2021-10-13 DIAGNOSIS — R051 Acute cough: Secondary | ICD-10-CM | POA: Diagnosis not present

## 2021-10-13 DIAGNOSIS — R0982 Postnasal drip: Secondary | ICD-10-CM | POA: Diagnosis not present

## 2021-10-13 DIAGNOSIS — M6281 Muscle weakness (generalized): Secondary | ICD-10-CM | POA: Diagnosis not present

## 2021-10-13 DIAGNOSIS — J302 Other seasonal allergic rhinitis: Secondary | ICD-10-CM | POA: Diagnosis not present

## 2021-10-13 DIAGNOSIS — J309 Allergic rhinitis, unspecified: Secondary | ICD-10-CM | POA: Diagnosis not present

## 2021-10-13 DIAGNOSIS — G20 Parkinson's disease: Secondary | ICD-10-CM | POA: Diagnosis not present

## 2021-10-15 DIAGNOSIS — N390 Urinary tract infection, site not specified: Secondary | ICD-10-CM | POA: Diagnosis not present

## 2021-10-16 DIAGNOSIS — M6281 Muscle weakness (generalized): Secondary | ICD-10-CM | POA: Diagnosis not present

## 2021-10-16 DIAGNOSIS — G20 Parkinson's disease: Secondary | ICD-10-CM | POA: Diagnosis not present

## 2021-10-19 DIAGNOSIS — Z9181 History of falling: Secondary | ICD-10-CM | POA: Diagnosis not present

## 2021-10-19 DIAGNOSIS — W19XXXA Unspecified fall, initial encounter: Secondary | ICD-10-CM | POA: Diagnosis not present

## 2021-10-20 DIAGNOSIS — M6281 Muscle weakness (generalized): Secondary | ICD-10-CM | POA: Diagnosis not present

## 2021-10-20 DIAGNOSIS — G20 Parkinson's disease: Secondary | ICD-10-CM | POA: Diagnosis not present

## 2021-10-22 DIAGNOSIS — G20 Parkinson's disease: Secondary | ICD-10-CM | POA: Diagnosis not present

## 2021-10-22 DIAGNOSIS — M6281 Muscle weakness (generalized): Secondary | ICD-10-CM | POA: Diagnosis not present

## 2021-10-28 DIAGNOSIS — M6281 Muscle weakness (generalized): Secondary | ICD-10-CM | POA: Diagnosis not present

## 2021-10-28 DIAGNOSIS — G20 Parkinson's disease: Secondary | ICD-10-CM | POA: Diagnosis not present

## 2021-10-29 DIAGNOSIS — M6281 Muscle weakness (generalized): Secondary | ICD-10-CM | POA: Diagnosis not present

## 2021-10-29 DIAGNOSIS — G20 Parkinson's disease: Secondary | ICD-10-CM | POA: Diagnosis not present

## 2021-11-02 ENCOUNTER — Other Ambulatory Visit: Payer: Self-pay

## 2021-11-02 ENCOUNTER — Ambulatory Visit (INDEPENDENT_AMBULATORY_CARE_PROVIDER_SITE_OTHER): Payer: Medicare Other | Admitting: Physician Assistant

## 2021-11-02 ENCOUNTER — Encounter: Payer: Self-pay | Admitting: Physician Assistant

## 2021-11-02 DIAGNOSIS — M7061 Trochanteric bursitis, right hip: Secondary | ICD-10-CM | POA: Diagnosis not present

## 2021-11-02 NOTE — Progress Notes (Signed)
HPI: [name] comes in today stating she had hip pain for the past 10 days but it is now better.  She has had pain from her knee to the lateral aspect of the right hip.  She does have known lumbar degenerative disc disease with spinal stenosis.  She is describing no groin pain.  She is describing no numbness tingling down the leg.  Most of her radicular pain in the past has been down the left leg.  She ambulates with a Rollator.  Physical exam: Bilateral hips good range of motion of both hips.  Tenderness over the right hip trochanteric region and down the IT band.  No tenderness down the left IT band.  Impression: Right hip IT band syndrome  Plan: Due to the fact the patient at this point in time is really having no significant pain recommend she continue therapy for gait balance and also to incorporate IT band stretching.  She is given prescription for this.  She will follow-up with [place] as needed.  Questions were encouraged and answered

## 2021-11-03 DIAGNOSIS — M6281 Muscle weakness (generalized): Secondary | ICD-10-CM | POA: Diagnosis not present

## 2021-11-03 DIAGNOSIS — G20 Parkinson's disease: Secondary | ICD-10-CM | POA: Diagnosis not present

## 2021-11-06 DIAGNOSIS — G20 Parkinson's disease: Secondary | ICD-10-CM | POA: Diagnosis not present

## 2021-11-06 DIAGNOSIS — M6281 Muscle weakness (generalized): Secondary | ICD-10-CM | POA: Diagnosis not present

## 2021-11-09 ENCOUNTER — Other Ambulatory Visit: Payer: Self-pay | Admitting: Internal Medicine

## 2021-11-09 DIAGNOSIS — Z1231 Encounter for screening mammogram for malignant neoplasm of breast: Secondary | ICD-10-CM

## 2021-11-10 DIAGNOSIS — G20 Parkinson's disease: Secondary | ICD-10-CM | POA: Diagnosis not present

## 2021-11-10 DIAGNOSIS — M6281 Muscle weakness (generalized): Secondary | ICD-10-CM | POA: Diagnosis not present

## 2021-11-11 DIAGNOSIS — G20 Parkinson's disease: Secondary | ICD-10-CM | POA: Diagnosis not present

## 2021-11-11 DIAGNOSIS — M6281 Muscle weakness (generalized): Secondary | ICD-10-CM | POA: Diagnosis not present

## 2021-11-17 DIAGNOSIS — G20 Parkinson's disease: Secondary | ICD-10-CM | POA: Diagnosis not present

## 2021-11-17 DIAGNOSIS — M6281 Muscle weakness (generalized): Secondary | ICD-10-CM | POA: Diagnosis not present

## 2021-11-19 DIAGNOSIS — M6281 Muscle weakness (generalized): Secondary | ICD-10-CM | POA: Diagnosis not present

## 2021-11-19 DIAGNOSIS — G20 Parkinson's disease: Secondary | ICD-10-CM | POA: Diagnosis not present

## 2021-11-20 DIAGNOSIS — N3946 Mixed incontinence: Secondary | ICD-10-CM | POA: Diagnosis not present

## 2021-11-20 DIAGNOSIS — N3020 Other chronic cystitis without hematuria: Secondary | ICD-10-CM | POA: Diagnosis not present

## 2021-11-24 DIAGNOSIS — G20 Parkinson's disease: Secondary | ICD-10-CM | POA: Diagnosis not present

## 2021-11-24 DIAGNOSIS — M6281 Muscle weakness (generalized): Secondary | ICD-10-CM | POA: Diagnosis not present

## 2021-11-26 DIAGNOSIS — M6281 Muscle weakness (generalized): Secondary | ICD-10-CM | POA: Diagnosis not present

## 2021-11-26 DIAGNOSIS — G20 Parkinson's disease: Secondary | ICD-10-CM | POA: Diagnosis not present

## 2021-11-27 DIAGNOSIS — L814 Other melanin hyperpigmentation: Secondary | ICD-10-CM | POA: Diagnosis not present

## 2021-11-27 DIAGNOSIS — L918 Other hypertrophic disorders of the skin: Secondary | ICD-10-CM | POA: Diagnosis not present

## 2021-11-27 DIAGNOSIS — L821 Other seborrheic keratosis: Secondary | ICD-10-CM | POA: Diagnosis not present

## 2021-11-27 DIAGNOSIS — D225 Melanocytic nevi of trunk: Secondary | ICD-10-CM | POA: Diagnosis not present

## 2021-11-27 DIAGNOSIS — D2262 Melanocytic nevi of left upper limb, including shoulder: Secondary | ICD-10-CM | POA: Diagnosis not present

## 2021-11-27 DIAGNOSIS — L820 Inflamed seborrheic keratosis: Secondary | ICD-10-CM | POA: Diagnosis not present

## 2021-11-27 DIAGNOSIS — Z85828 Personal history of other malignant neoplasm of skin: Secondary | ICD-10-CM | POA: Diagnosis not present

## 2021-11-27 DIAGNOSIS — D2261 Melanocytic nevi of right upper limb, including shoulder: Secondary | ICD-10-CM | POA: Diagnosis not present

## 2021-11-27 DIAGNOSIS — L218 Other seborrheic dermatitis: Secondary | ICD-10-CM | POA: Diagnosis not present

## 2021-11-27 DIAGNOSIS — D1801 Hemangioma of skin and subcutaneous tissue: Secondary | ICD-10-CM | POA: Diagnosis not present

## 2021-12-01 DIAGNOSIS — G20 Parkinson's disease: Secondary | ICD-10-CM | POA: Diagnosis not present

## 2021-12-01 DIAGNOSIS — M6281 Muscle weakness (generalized): Secondary | ICD-10-CM | POA: Diagnosis not present

## 2021-12-04 DIAGNOSIS — G20 Parkinson's disease: Secondary | ICD-10-CM | POA: Diagnosis not present

## 2021-12-04 DIAGNOSIS — M6281 Muscle weakness (generalized): Secondary | ICD-10-CM | POA: Diagnosis not present

## 2021-12-08 DIAGNOSIS — M6281 Muscle weakness (generalized): Secondary | ICD-10-CM | POA: Diagnosis not present

## 2021-12-08 DIAGNOSIS — G20 Parkinson's disease: Secondary | ICD-10-CM | POA: Diagnosis not present

## 2021-12-10 DIAGNOSIS — G20 Parkinson's disease: Secondary | ICD-10-CM | POA: Diagnosis not present

## 2021-12-10 DIAGNOSIS — M6281 Muscle weakness (generalized): Secondary | ICD-10-CM | POA: Diagnosis not present

## 2021-12-15 ENCOUNTER — Other Ambulatory Visit: Payer: Self-pay

## 2021-12-15 ENCOUNTER — Ambulatory Visit
Admission: RE | Admit: 2021-12-15 | Discharge: 2021-12-15 | Disposition: A | Discharge status: Home / Self Care | Payer: Medicare HMO | Source: Ambulatory Visit | Attending: Internal Medicine | Admitting: Internal Medicine

## 2021-12-15 DIAGNOSIS — Z1231 Encounter for screening mammogram for malignant neoplasm of breast: Secondary | ICD-10-CM | POA: Diagnosis not present

## 2022-01-06 ENCOUNTER — Encounter: Payer: Self-pay | Admitting: Podiatry

## 2022-01-06 ENCOUNTER — Ambulatory Visit (INDEPENDENT_AMBULATORY_CARE_PROVIDER_SITE_OTHER): Payer: Medicare HMO | Admitting: Podiatry

## 2022-01-06 ENCOUNTER — Other Ambulatory Visit: Payer: Self-pay

## 2022-01-06 DIAGNOSIS — M79675 Pain in left toe(s): Secondary | ICD-10-CM

## 2022-01-06 DIAGNOSIS — B351 Tinea unguium: Secondary | ICD-10-CM

## 2022-01-06 DIAGNOSIS — M79674 Pain in right toe(s): Secondary | ICD-10-CM

## 2022-01-06 DIAGNOSIS — D689 Coagulation defect, unspecified: Secondary | ICD-10-CM

## 2022-01-06 NOTE — Progress Notes (Signed)
°  Subjective:  Patient ID: [name], female    DOB: [birth year],  MRN: [id number]  Chief Complaint  Patient presents with   Nail Problem    Nail trim    81 y.o. female returns for the above complaint.  Patient presents with a follow-up with thickened elongated mycotic toenails that has been causing a lot of pain.  Patient has not been able to debride them.  She states that she would like to have them debrided is causing her pain when ambulating.  She denies any other acute complaints. s.  Objective:  There were no vitals filed for this visit. Podiatric Exam: Vascular: dorsalis pedis and posterior tibial pulses are palpable bilateral. Capillary return is immediate. Temperature gradient is WNL. Skin turgor WNL  Sensorium: Normal Semmes Weinstein monofilament test. Normal tactile sensation bilaterally. Nail Exam: Pt has thick disfigured discolored nails with subungual debris noted bilateral entire nail hallux through fifth toenails. Pain on palpation of the entire/total nail on 1st digit of the left Ulcer Exam: There is no evidence of ulcer or pre-ulcerative changes or infection. Orthopedic Exam: Muscle tone and strength are WNL. No limitations in general ROM.  No further crepitus noted at the first metatarsophalangeal joint with range of motion active and passive to the right foot.  No further intra-articular pain noted to the right first metatarsophalangeal joint.  No extensor or flexor tendinitis noted.  HAV  B/L.  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 Skin: No Porokeratosis. No infection or ulcers   Assessment:   1. Pain due to onychomycosis of toenails of both feet   2. 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  Nail contusion/dystrophy hallux, left -Clinically healed  No follow-ups on file.   Right first metatarsophalangeal joint arthritis/capsulitis -Clinically resolved with one steroid injection.  Onychomycosis with pain   -Nails palliatively debrided as below. -Educated on self-care  Procedure: Nail Debridement Rationale: pain  Type of Debridement: manual, sharp debridement. Instrumentation: Nail nipper, rotary burr. Number of Nails: Nine  Procedures and Treatment: Consent by patient was obtained for treatment procedures. The patient understood the discussion of treatment and procedures well. All questions were answered thoroughly reviewed. Debridement of mycotic and hypertrophic toenails, 1 through 5 bilateral and clearing of subungual debris. No ulceration, no infection noted.  Return Visit-Office Procedure: Patient instructed to return to the office for a follow up visit 3 months for continued evaluation and treatment.  Boneta Lucks, DPM    No follow-ups on file.

## 2022-01-12 ENCOUNTER — Encounter: Payer: Self-pay | Admitting: Neurology

## 2022-01-14 DIAGNOSIS — H5203 Hypermetropia, bilateral: Secondary | ICD-10-CM | POA: Diagnosis not present

## 2022-01-14 DIAGNOSIS — Z961 Presence of intraocular lens: Secondary | ICD-10-CM | POA: Diagnosis not present

## 2022-01-14 DIAGNOSIS — H3500 Unspecified background retinopathy: Secondary | ICD-10-CM | POA: Diagnosis not present

## 2022-01-28 DIAGNOSIS — R0602 Shortness of breath: Secondary | ICD-10-CM | POA: Diagnosis not present

## 2022-01-28 DIAGNOSIS — I10 Essential (primary) hypertension: Secondary | ICD-10-CM | POA: Diagnosis not present

## 2022-01-28 DIAGNOSIS — R61 Generalized hyperhidrosis: Secondary | ICD-10-CM | POA: Diagnosis not present

## 2022-01-28 DIAGNOSIS — D6869 Other thrombophilia: Secondary | ICD-10-CM | POA: Diagnosis not present

## 2022-01-28 DIAGNOSIS — R0600 Dyspnea, unspecified: Secondary | ICD-10-CM | POA: Diagnosis not present

## 2022-01-28 DIAGNOSIS — N3281 Overactive bladder: Secondary | ICD-10-CM | POA: Diagnosis not present

## 2022-01-28 DIAGNOSIS — Z86718 Personal history of other venous thrombosis and embolism: Secondary | ICD-10-CM | POA: Diagnosis not present

## 2022-01-28 DIAGNOSIS — Z86711 Personal history of pulmonary embolism: Secondary | ICD-10-CM | POA: Diagnosis not present

## 2022-01-28 DIAGNOSIS — Z7901 Long term (current) use of anticoagulants: Secondary | ICD-10-CM | POA: Diagnosis not present

## 2022-02-01 DIAGNOSIS — D6869 Other thrombophilia: Secondary | ICD-10-CM | POA: Diagnosis not present

## 2022-02-01 DIAGNOSIS — I10 Essential (primary) hypertension: Secondary | ICD-10-CM | POA: Diagnosis not present

## 2022-02-01 DIAGNOSIS — R61 Generalized hyperhidrosis: Secondary | ICD-10-CM | POA: Diagnosis not present

## 2022-02-01 DIAGNOSIS — G3184 Mild cognitive impairment, so stated: Secondary | ICD-10-CM | POA: Diagnosis not present

## 2022-02-01 DIAGNOSIS — R0600 Dyspnea, unspecified: Secondary | ICD-10-CM | POA: Diagnosis not present

## 2022-02-01 DIAGNOSIS — Z86711 Personal history of pulmonary embolism: Secondary | ICD-10-CM | POA: Diagnosis not present

## 2022-02-01 DIAGNOSIS — Z86718 Personal history of other venous thrombosis and embolism: Secondary | ICD-10-CM | POA: Diagnosis not present

## 2022-02-01 DIAGNOSIS — Z7901 Long term (current) use of anticoagulants: Secondary | ICD-10-CM | POA: Diagnosis not present

## 2022-02-18 DIAGNOSIS — Z86711 Personal history of pulmonary embolism: Secondary | ICD-10-CM | POA: Diagnosis not present

## 2022-02-18 DIAGNOSIS — R7301 Impaired fasting glucose: Secondary | ICD-10-CM | POA: Diagnosis not present

## 2022-02-18 DIAGNOSIS — R69 Illness, unspecified: Secondary | ICD-10-CM | POA: Diagnosis not present

## 2022-02-18 DIAGNOSIS — I10 Essential (primary) hypertension: Secondary | ICD-10-CM | POA: Diagnosis not present

## 2022-02-18 DIAGNOSIS — D6869 Other thrombophilia: Secondary | ICD-10-CM | POA: Diagnosis not present

## 2022-02-18 DIAGNOSIS — G20 Parkinson's disease: Secondary | ICD-10-CM | POA: Diagnosis not present

## 2022-02-18 DIAGNOSIS — M810 Age-related osteoporosis without current pathological fracture: Secondary | ICD-10-CM | POA: Diagnosis not present

## 2022-02-18 DIAGNOSIS — Z86718 Personal history of other venous thrombosis and embolism: Secondary | ICD-10-CM | POA: Diagnosis not present

## 2022-02-18 DIAGNOSIS — G3184 Mild cognitive impairment, so stated: Secondary | ICD-10-CM | POA: Diagnosis not present

## 2022-02-26 ENCOUNTER — Other Ambulatory Visit (HOSPITAL_COMMUNITY): Payer: Self-pay | Admitting: *Deleted

## 2022-03-01 ENCOUNTER — Inpatient Hospital Stay (HOSPITAL_COMMUNITY): Admission: RE | Admit: 2022-03-01 | Payer: Medicare HMO | Source: Ambulatory Visit

## 2022-03-03 ENCOUNTER — Other Ambulatory Visit: Payer: Self-pay

## 2022-03-03 ENCOUNTER — Ambulatory Visit (HOSPITAL_COMMUNITY)
Admission: RE | Admit: 2022-03-03 | Discharge: 2022-03-03 | Disposition: A | Discharge status: Home / Self Care | Payer: Medicare HMO | Source: Ambulatory Visit | Attending: Internal Medicine | Admitting: Internal Medicine

## 2022-03-03 DIAGNOSIS — M810 Age-related osteoporosis without current pathological fracture: Secondary | ICD-10-CM | POA: Diagnosis not present

## 2022-03-03 MED ORDER — ZOLEDRONIC ACID 5 MG/100ML IV SOLN: 5.0000 mg | Freq: Once | INTRAVENOUS | Status: AC

## 2022-03-03 MED ORDER — ZOLEDRONIC ACID 5 MG/100ML IV SOLN
INTRAVENOUS | Status: AC
  Administered 2022-03-03: 5 mg via INTRAVENOUS
  Filled 2022-03-03: qty 100

## 2022-03-04 NOTE — Progress Notes (Signed)
? ? ?Assessment/Plan:  ? ?1.  ET/PD ? -Continue carbidopa/levodopa 25/100 to 2 tablets at 9am/2 tablets at 1pm/1 tablet at 5pm.  Discussed appropriate timing of medication. ? -Continue propranolol, 20 mg twice per day ? -Biggest issue here is lack of exercise.  She really is not moving at all.  She is getting ready to start back physical therapy, which is good, but discussed with her the importance of regular, daily exercise and what that might look like.  Discussed with her the options in her community and out of her community, and including taking the [place] bus to [employer] exercise outside of [place]. ? -DaTscan was abnormal in October, 2021. ? -She and her daughter met with my LCSW today, since she said she was unaware of community resources (we have discussed with her in the past). ? ? ?2.  Recurrent DVT/PE ? -On Eliquis ? ?3.  RBD ? -Hold Tylenol PM.  Discussed this last visit as well. ? -lexapro may contribute.  Discussed this with her last visit and this visit.  Told her that our options would be to decrease the Lexapro.  Told her I really do not want to add the clonazepam because of potential side effects, nor did she.  She also does not want to decrease the Lexapro.  She states that her symptoms of RBD are not that bad.  She already uses a railing for safety. ?4.  Dysphagia ? -MBE in October, 2022 with minimal oropharyngeal dysphagia.  Regular diet with thin liquid recommended. ? ? ?Subjective:  ? ?[name] was seen today in follow up for ET/Parkinsons disease.  My previous records were reviewed prior to todays visit as well as outside records available to me.  She is accompanied by her daughter who supplements the history.  Levodopa was increased last visit.  She is tolerating that well but admits that she doesn't take it regularly (2nd one may be at 6pm and other toward bed).  She continues to have tremor.  She had a modified barium swallow September 22, 2021.  This demonstrated minimal  oropharyngeal dysphagia.  Regular diet, thin liquids recommended.  She is not exercising.  States that dr. [name] scheduled PT.   ? ?Current prescribed movement disorder medications: ?Carbidopa/levodopa 25/100, 2 tablets at 9am/2 tablets at 1pm/1 tablet at 5pm (increased last visit) ?Propranolol 20 mg twice per day ? ? ?PREVIOUS MEDICATIONS: Topiramate, memory change ? ?ALLERGIES:   ?Allergies  ?Allergen Reactions  ? Buprenorphine Hcl Nausea And Vomiting  ? Codeine Nausea And Vomiting  ?  Other reaction(s): Other (See Comments) ?But tolerates Tramadol ?But tolerates Tramadol ?Vomiting ?  ? Morphine And Related Nausea And Vomiting  ? Morphine   ?  Other reaction(s): Other (See Comments) ?Vomiting  ? Procaine   ?  Other reaction(s): Other (See Comments) ?Other reaction(s): Shakes ?Novacaine-Shakes  ? ? ?CURRENT MEDICATIONS:  ?Outpatient Encounter Medications as of 03/05/2022  ?Medication Sig  ? amLODipine (NORVASC) 2.5 MG tablet Take 2.5 mg by mouth daily.   ? apixaban (ELIQUIS) 5 MG TABS tablet Take 1 tablet (5 mg total) by mouth 2 (two) times daily.  ? busPIRone (BUSPAR) 10 MG tablet Take 10 mg by mouth 2 (two) times daily.   ? carbidopa-levodopa (SINEMET IR) 25-100 MG tablet 2 at 9am, 2 at 1 pm, 1 at 5pm  ? cholecalciferol (VITAMIN D) 25 MCG (1000 UNIT) tablet Take 1,000 Units by mouth daily.  ? donepezil (ARICEPT) 10 MG tablet Take 10 mg by mouth  at bedtime.  ? escitalopram (LEXAPRO) 20 MG tablet Take 20 mg by mouth daily.   ? esomeprazole (NEXIUM) 40 MG capsule Take 40 mg by mouth 2 (two) times daily before a meal.   ? loratadine (CLARITIN) 10 MG tablet Take 10 mg by mouth at bedtime.  ? mirabegron ER (MYRBETRIQ) 50 MG TB24 tablet Take 50 mg by mouth at bedtime.   ? olmesartan (BENICAR) 20 MG tablet Take 20 mg by mouth in the morning and at bedtime.   ? propranolol (INDERAL) 20 MG tablet TAKE 1 TABLET BY MOUTH TWO TIMES A DAY  ? TOVIAZ 8 MG TB24 tablet Take 8 mg by mouth at bedtime.   ? vitamin B-12 (CYANOCOBALAMIN)  1000 MCG tablet Take 1,000 mcg by mouth daily.  ? acetaminophen (TYLENOL) 500 MG tablet Take 500 mg by mouth every 6 (six) hours as needed (for pain.).  (Patient not taking: Reported on 09/16/2021)  ? nitrofurantoin (MACRODANTIN) 100 MG capsule Take 100 mg by mouth in the morning. (Patient not taking: Reported on 03/05/2022)  ? saccharomyces boulardii (FLORASTOR) 250 MG capsule Take 250 mg by mouth 2 (two) times daily. (Patient not taking: Reported on 09/16/2021)  ? [DISCONTINUED] carvedilol (COREG) 6.25 MG tablet   ? ?No facility-administered encounter medications on file as of 03/05/2022.  ? ? ?Objective:  ? ?PHYSICAL EXAMINATION:   ? ?VITALS:   ?Vitals:  ? 03/05/22 1525  ?BP: 135/84  ?Pulse: 62  ?SpO2: 93%  ?Weight: 215 lb 6.4 oz (97.7 kg)  ?Height: _0  (1.499 m)  ? ? ? ? ?GEN:  The patient appears stated age and is in NAD. ?HEENT:  Normocephalic, atraumatic.  The mucous membranes are moist.  ? ?Neurological examination: ? ?Orientation: The patient is alert and oriented x3. ?Cranial nerves: There is good facial symmetry without facial hypomimia. The speech is fluent and clear. Soft palate rises symmetrically and there is no tongue deviation. Hearing is intact to conversational tone. ?Sensation: Sensation is intact to light touch throughout ?Motor: Strength is at least antigravity x4. ? ?Movement examination: ?Tone: There is nl tone in the UE/LE ?Abnormal movements: there is rest tremor, very mild, bilaterally, right greater than left ?Coordination:  There is min decremation with RAM's, on the right ?Gait and Station: The patient has mild difficulty arising out of a deep-seated chair without the use of the hands. The patients gait is wide-based and somewhat antalgic.  She is short stepped but does not shuffle.  This is true with the walker and without the walker.  This really has not changed much from last visit. ? ?I have reviewed and interpreted the following labs independently ? ?  Chemistry   ?   ?Component  Value Date/Time  ? NA 132 (L) 06/07/2020 0332  ? K 3.9 06/07/2020 0332  ? CL 102 06/07/2020 0332  ? CO2 23 06/07/2020 0332  ? BUN 15 06/07/2020 0332  ? CREATININE 0.54 06/07/2020 0332  ?    ?Component Value Date/Time  ? CALCIUM 8.1 (L) 06/07/2020 0332  ? ALKPHOS 49 01/10/2019 2254  ? AST 14 (L) 01/10/2019 2254  ? ALT 12 01/10/2019 2254  ? BILITOT 0.4 01/10/2019 2254  ?  ? ? ? ?Lab Results  ?Component Value Date  ? WBC 12.5 (H) 06/07/2020  ? HGB 11.5 (L) 06/07/2020  ? HCT 34.8 (L) 06/07/2020  ? MCV 96.1 06/07/2020  ? PLT 183 06/07/2020  ? ? ?Lab Results  ?Component Value Date  ? TSH 1.749 05/18/2018  ? ? ? ?  Total time spent on today's visit was 41 minutes, including both face-to-face time and nonface-to-face time.  Time included that spent on review of records (prior notes available to me/labs/imaging if pertinent), discussing treatment and goals, answering patient's questions and coordinating care. ? ?Cc:  Ginger Organ., MD ? ?

## 2022-03-05 ENCOUNTER — Other Ambulatory Visit: Payer: Self-pay

## 2022-03-05 ENCOUNTER — Ambulatory Visit (INDEPENDENT_AMBULATORY_CARE_PROVIDER_SITE_OTHER): Payer: Medicare HMO | Admitting: Neurology

## 2022-03-05 ENCOUNTER — Encounter: Payer: Self-pay | Admitting: Neurology

## 2022-03-05 VITALS — BP 135/84 | HR 62 | Ht 59.0 in | Wt 215.4 lb

## 2022-03-05 DIAGNOSIS — G4752 REM sleep behavior disorder: Secondary | ICD-10-CM | POA: Diagnosis not present

## 2022-03-05 DIAGNOSIS — G250 Essential tremor: Secondary | ICD-10-CM | POA: Diagnosis not present

## 2022-03-05 DIAGNOSIS — G20 Parkinson's disease: Secondary | ICD-10-CM | POA: Diagnosis not present

## 2022-03-05 NOTE — Patient Instructions (Signed)
Local and Online Resources for Power over Parkinson's Group ?March 2023 ? ?LOCAL [place] PARKINSON'S GROUPS  ?Power over Parkinson's Group :   ?Power Over Parkinson's Patient Education Group will be Wednesday, March 8th-*Hybrid meting*- in person at [place] location and via [facility] at 2:00 pm.   ?Upcoming Power over Parkinson's Meetings:  2nd Wednesdays of the month at 2 pm:  March 8th, April 12th ?Contact Amy Marriott at [email or web address] if interested in participating in this group ?Parkinson's Care Partners Group:    3rd Mondays, Contact Misty Paladino ?Atypical Parkinsonian Patient Group:   4th Wednesdays, Contact Misty Paladino ?If you are interested in participating in these groups with Misty, please contact her directly for how to join those meetings.  Her contact information is [email or web address].   ? ?LOCAL EVENTS AND NEW OFFERINGS ?Parkinson's Wellness Event:  Pottery Night at [facility].  Friday, March 10th 5:30-7:30 pm.  Sponsored by [employer] [place].  FREE event for people with Parkinson's and care partners.  RSVP to [facility] at [email or web address] ?Dine out at [employer].  Celebrate Parkinson's disease Awareness Month and Support the Parkinson's Movement Disorder Fund.   Wednesday, April 19th 4-6 pm at [place], [employer].  (Give receipt to cashier and 20% will be donated) ?Parkinson's T-shirts for sale!  Designed by a local group member, with funds going to [place].  $20.00  Contact Misty to purchase (see email above) ?New PWR! Moves Class offering at [employer]!  Fridays 1-2 pm in March (likely to switch days and times after March).  Come try it out and see if PWR! Moves is a good fit for your exercise routine!  Contact Amy Marriott for details:  [email or web address] ?Hamil-[place] Challenge Bike, Run, Walk for PD, PSP, MSA.    Saturday, April 8th at 8 am.  [place]   Proceeds go to offset costs of exercise programs locally.  To Register, visit website:  www.[email or web address] ? ?ONLINE EDUCATION AND SUPPORT ?[place]:  www.parkinson.org ?PD Health at Home continues:  Mindfulness Mondays, Expert Briefing Tuesdays, Wellness Wednesdays, Take Time Thursdays, Fitness Fridays  ?Upcoming Education:  ?Parkinson's and Medications:  What's New.  Wednesday, March 8th at 1:00 pm. ?Freezing and Fall Prevention in Parkinson's.  Wednesday, April 12th at 1:00 pm ?Register for Armed forces operational officer) at WatchCalls.si ?[place] Chapter Parkinson's Symposium: Cognition Changes- a free in-person ([place], [place]) and online Audiological scientist) event for people with Parkinson's and their loved ones. During this event we will explore new treatments and practical strategies for addressing these changes.  Saturday, April 1st, 10 am-1 pm.  Register at [employer].[email or web address]/ or contact [place] at [phone] or [email or web address]. ? Please check out their website to sign up for emails and see their full online offerings ? ?[place]:  www.[email or web address]  ?Third Thursday Webinars:  On the third Thursday of every month at 12 p.m. ET, join our free live webinars to learn about various aspects of living with Parkinson's disease and our work to speed medical breakthroughs. ?Upcoming Webinar:  The Power of Women in Philanthropy.  Tues, March 28th at  12 pm. ?Check out additional information on their website to see their full online offerings ? ?[place]:  www.[email or web address] ?Upcoming Webinar:   Stay tuned ?Webinar Series:  Living with Parkinson's Meetup.   Third Thursdays of each month at 3 pm ?Care Partner Monthly Meetup.  With Robin Searing Phinney.  First Tuesday of each month, 2 pm ?Check out additional information  to Live Well Today on their  website ? ?Parkinson and Movement Disorders (PMD) Alliance:  www.pmdalliance.org ?[employer] Online:  Online Education Events ?Sign up for emails, which are sent weekly to give you updates on programming and online offerings ? ?Parkinson's Association of the [place]:  www.parkinsonassociation.org ?Information on online support groups, education events, and online exercises including Yoga, Parkinson's exercises and more-LOTS of information on links to PD resources and online events ?Virtual Support Group through [employer] of the [place]; next one is scheduled for Wednesday, *April 5th at 2 pm. *March meeting has been cancelled. (These are typically scheduled for the 1st Wednesday of the month at 2 pm).  Visit website for details. ?MOVEMENT AND EXERCISE OPPORTUNITIES ?Parkinson's DRUMMING Classes/Music Therapy with Doylene Canning:  This is a returning class and it's FREE!  2nd Mondays, continuing March 13th , 11:00 at the [place].  Contact *Misty Taylor-Paladino at [email or web address] or Doylene Canning at [phone] or [email or web address]  ?PWR! Moves Classes at [place].  Wednesdays 10 and 11 am.   NEW PWR! Moves Class offering at [employer].  Fridays 1-2 pm.  Contact Amy Marriott, PT [email or web address] if interested. ?Here is a link to the PWR!Moves classes on Zoom from [place] - Daily Mon-Sat at 10:00. Via Zoom, FREE and open to all.  There is also a link below via Facebook if you use that platform. ?[email or web address].si ?[email or web address]://[email or web address] ? ?Parkinson's Wellness Recovery (PWR! Moves)  www.pwr4life.org ?Info on the PWR! Virtual Experience:  You will have access to our expertise through  self-assessment, guided plans that start with the PD-specific fundamentals, educational content, tips, Q&A with an expert, and a growing Art therapist of PD-specific pre-recorded and live exercise classes of varying types and intensity - both physical and cognitive! If that is not enough, we offer 1:1 wellness consultations (in-person or virtual) to personalize your PWR! Research scientist (medical).  ?[employer] Fridays:  ?As part of the PD Health @ Home program, this free video series focuses each week on one aspect of fitness designed to support people living with Parkinson's.  These weekly videos highlight the [place] recent fitness guidelines for people with Parkinson's disease. ?www.KVTVnet.com.cy ?Dance for PD website is offering free, live-stream classes throughout the week, as well as links to [employer] of classes:  https://danceforparkinsons.[email or web address]/ ?Dance for Parkinson's in-person class.  February 1-April 26, Wednesdays 4-5 pm.  Free class for people with Parkinson's disease, at [address], [place], [place].  Contact [phone] or [email or web address] to register ?Virtual dance and Pilates for Parkinson's classes: Click on the Community Tab> Parkinson's Movement Initiative Tab.  To register for classes and for more information, visit www.[email or web address].co.za and click the ?community? tab.  ?[employer] Parkinson's Cycling Classes  ?[employer]:  Thursdays @ Noon-Live classes at [employer] ([employer] at [email or web address] or [phone]) ?[facility] [employer] [employer]: Virtual Classes Mondays and Thursdays Jeanette Caprice classes Tuesday, Wednesday and Thursday (contact [place] at [email or web address]  or [phone]) ?[employer] ?Varied levels of classes are offered Mondays, Tuesdays and Thursdays at [employer].  ?Stretching with Verdis Frederickson weekly class is also offered for people with  Parkinson's ?To observe a class or for more information, call [phone] or email Hezzie Bump at [email or web address] ?ADDITIONAL SUPPORT AND RESOURCES ?[employer]:Online Caregiver Education Opportunities:

## 2022-03-11 DIAGNOSIS — M6281 Muscle weakness (generalized): Secondary | ICD-10-CM | POA: Diagnosis not present

## 2022-03-11 DIAGNOSIS — R2681 Unsteadiness on feet: Secondary | ICD-10-CM | POA: Diagnosis not present

## 2022-03-15 ENCOUNTER — Ambulatory Visit (INDEPENDENT_AMBULATORY_CARE_PROVIDER_SITE_OTHER): Payer: Medicare HMO | Admitting: Physician Assistant

## 2022-03-15 ENCOUNTER — Ambulatory Visit (INDEPENDENT_AMBULATORY_CARE_PROVIDER_SITE_OTHER): Payer: Medicare HMO

## 2022-03-15 ENCOUNTER — Encounter: Payer: Self-pay | Admitting: Physician Assistant

## 2022-03-15 DIAGNOSIS — M25552 Pain in left hip: Secondary | ICD-10-CM

## 2022-03-15 DIAGNOSIS — M6281 Muscle weakness (generalized): Secondary | ICD-10-CM | POA: Diagnosis not present

## 2022-03-15 DIAGNOSIS — R2681 Unsteadiness on feet: Secondary | ICD-10-CM | POA: Diagnosis not present

## 2022-03-15 MED ORDER — METHYLPREDNISOLONE ACETATE 40 MG/ML IJ SUSP
40.0000 mg | INTRAMUSCULAR | Status: AC | PRN
  Administered 2022-03-15: 40 mg via INTRA_ARTICULAR

## 2022-03-15 MED ORDER — LIDOCAINE HCL 1 % IJ SOLN
3.0000 mL | INTRAMUSCULAR | Status: AC | PRN
  Administered 2022-03-15: 3 mL

## 2022-03-15 NOTE — Progress Notes (Signed)
? ?Office Visit Note ?  ?Patient: [name]           ?Date of Birth: [birth year]           ?MRN: [id number] ?Visit Date: 03/15/2022 ?             ?Requested by: [name]., MD ?[address] ?[place],  [place] [address] ?PCP: [name]., MD ? ? ?Assessment & Plan: ?Visit Diagnoses:  ?1. Pain in left hip   ? ? ?Plan:  ?She shown IT band stretching exercises.  She will follow-up with [place] if her pain persist or becomes worse.  Questions were encouraged and answered ? ?Follow-Up Instructions: Return if symptoms worsen or fail to improve.  ? ?Orders:  ?Orders Placed This Encounter  ?Procedures  ? Large Joint Inj  ? XR HIP UNILAT W OR W/O PELVIS 2-3 VIEWS LEFT  ? ?No orders of the defined types were placed in this encounter. ? ? ? ? Procedures: ?Large Joint Inj: L greater trochanter on 03/15/2022 3:45 PM ?Indications: pain ?Details: 22 G 1.5 in needle, lateral approach ? ?Arthrogram: No ? ?Medications: 3 mL lidocaine 1 %; 40 mg methylPREDNISolone acetate 40 MG/ML ?Outcome: tolerated well, no immediate complications ?Procedure, treatment alternatives, risks and benefits explained, specific risks discussed. Consent was given by the patient. Immediately prior to procedure a time out was called to verify the correct patient, procedure, equipment, support staff and site/side marked as required. Patient was prepped and draped in the usual sterile fashion.  ? ? ? ? ?Clinical Data: ?No additional findings. ? ? ?Subjective: ?Chief Complaint  ?Patient presents with  ? Left Hip - Pain  ? ? ?HPI ?[name] comes in today with left hip pain that is been ongoing for the past 2 to 3 weeks no known injury.  History of left total knee arthroplasty.  Also history of low back surgery.  Pain lateral side of left hip down to the knee worse in the morning.  Using a walker to ambulate.  Sometimes uses wheelchair.  She is currently working with therapy to work on her balance.  Taking Tylenol is unable to take NSAIDs  secondary to being on chronic blood thinners.  No groin pain.  No fevers or chills.  She is nondiabetic. ? ?Review of Systems ?See HPI otherwise negative or noncontributory. ? ?Objective: ?Vital Signs: There were no vitals taken for this visit. ? ?Physical Exam ?Constitutional:   ?   Appearance: She is not ill-appearing or diaphoretic.  ?Pulmonary:  ?   Effort: Pulmonary effort is normal.  ?Neurological:  ?   Mental Status: She is alert and oriented to person, place, and time.  ?Psychiatric:     ?   Mood and Affect: Mood normal.  ? ? ?Ortho Exam ?Bilateral hips good range of motion without pain.  Tenderness over the left hip trochanteric region and down the IT band. ?Specialty Comments:  ?No specialty comments available. ? ?Imaging: ?XR HIP UNILAT W OR W/O PELVIS 2-3 VIEWS LEFT ? ?Result Date: 03/15/2022 ?AP pelvis lateral view left hip: No acute fracture.  Bilateral hips well located.  Both hips are well preserved.  No bony abnormalities.  ? ? ?PMFS History: ?Patient Active Problem List  ? Diagnosis Date Noted  ? 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 Unilateral primary osteoarthritis, left knee 02/21/2020  ? Depression 05/25/2019  ? Skin cancer 05/25/2019  ? Metatarsalgia of right foot 08/04/2018  ? Pulmonary embolus (  [place]) 05/19/2018  ? Pulmonary embolism ([place]) 05/18/2018  ? HTN (hypertension) 05/18/2018  ? Lumbar radiculopathy 11/13/2016  ? Hallux rigidus of right foot 05/19/2016  ? Onychomycosis of left great toe 05/19/2016  ? Gait disturbance 04/05/2016  ? Lumbar spinal stenosis 02/20/2016  ? Claudication ([place]) 02/03/2016  ? Ectropion of both eyes 04/07/2015  ? 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 Vitreous syneresis of both eyes 04/07/2015  ? Sleep apnea, obstructive 07/29/2014  ? Acute laryngitis 07/24/2014  ? Allergic rhinitis 07/24/2014  ? Hearing loss 07/24/2014  ? Hip osteoarthritis 07/24/2014  ? Hoarseness 07/24/2014  ? Lumbar degenerative disc disease 07/24/2014  ? Lumbar spondylosis  07/24/2014  ? 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 Urinary incontinence in female 07/24/2014  ? Epiphora due to insufficient drainage, bilateral 05/23/2014  ? Essential tremor 04/24/2014  ? Conjunctivochalasis 03/09/2012  ? Dermatochalasis 03/09/2012  ? Hyperopia with astigmatism and presbyopia 03/09/2012  ? Pinguecula of both eyes 03/09/2012  ? ?Past Medical History:  ?Diagnosis Date  ? Arthritis   ? arthritis in joints and back  ? Benign familial tremor   ? Clostridium difficile infection   ? DDD (degenerative disc disease), lumbar   ? Diverticulosis   ? DVT (deep venous thrombosis) ([place])   ? left leg  ? Gallstones   ? GERD (gastroesophageal reflux disease)   ? Hypertension   ? Osteoporosis   ? Wears glasses   ?  ?Family History  ?Problem Relation Age of Onset  ? Other Mother   ?     sarcoma  ? Other Father   ?     broken hip  ? Dementia Father   ? Pneumonia Father   ?  ?Past Surgical History:  ?Procedure Laterality Date  ? ABDOMINAL HYSTERECTOMY  1983  ? arterial vascular fistula  2004  ? coiled at [place]  [phone]  ? CARPAL TUNNEL RELEASE  right  ? CHOLECYSTECTOMY N/A 03/29/2013  ? Procedure: LAPAROSCOPIC CHOLECYSTECTOMY WITH INTRAOPERATIVE CHOLANGIOGRAM;  Surgeon: Shirley Lasso, MD;  Location: [facility];  Service: General;  Laterality: N/A;  ? EUS N/A 03/09/2013  ? Procedure: UPPER ENDOSCOPIC ULTRASOUND (EUS) LINEAR;  Surgeon: Shirley Beams, MD;  Location: [facility];  Service: Endoscopy;  Laterality: N/A;  ? LUMBAR LAMINECTOMY/DECOMPRESSION MICRODISCECTOMY  11/30/2012  ? Procedure: LUMBAR LAMINECTOMY/DECOMPRESSION MICRODISCECTOMY 2 LEVELS;  Surgeon: Shirley Sinning, MD;  Location: [facility];  Service: Orthopedics;  Laterality: N/A;  DECOMPRESSION LAMINECTOMY L4-L5, L5-S1 CENTRAL   ? ROTATOR CUFF REPAIR Bilateral 2004  ? two on right, one on left  ? TOTAL KNEE ARTHROPLASTY Left 06/06/2020  ? Procedure: LEFT TOTAL KNEE ARTHROPLASTY;  Surgeon: Shirley Rossetti, MD;   Location: [facility];  Service: Orthopedics;  Laterality: Left;  ? TRIGGER FINGER RELEASE Right   ?  hand  ? ?Social History  ? ?Occupational History  ? Occupation: Futures trader  ?  Comment: partially retired  ?Tobacco Use  ? Smoking status: Former  ?  Packs/day: 0.50  ?  Types: Cigarettes  ?  Quit date: 07/11/1970  ?  Years since quitting: 51.7  ? Smokeless tobacco: Never  ?Vaping Use  ? Vaping Use: Never used  ?Substance and Sexual Activity  ? Alcohol use: Yes  ?  Alcohol/week: 0.0 standard drinks  ?  Comment: wine occasionally  ? Drug use: No  ? Sexual activity: Not Currently  ? ? ? ? ? ? ?

## 2022-03-16 DIAGNOSIS — R2681 Unsteadiness on feet: Secondary | ICD-10-CM | POA: Diagnosis not present

## 2022-03-16 DIAGNOSIS — M6281 Muscle weakness (generalized): Secondary | ICD-10-CM | POA: Diagnosis not present

## 2022-03-19 DIAGNOSIS — M6281 Muscle weakness (generalized): Secondary | ICD-10-CM | POA: Diagnosis not present

## 2022-03-19 DIAGNOSIS — R2681 Unsteadiness on feet: Secondary | ICD-10-CM | POA: Diagnosis not present

## 2022-03-22 DIAGNOSIS — M6281 Muscle weakness (generalized): Secondary | ICD-10-CM | POA: Diagnosis not present

## 2022-03-22 DIAGNOSIS — R2681 Unsteadiness on feet: Secondary | ICD-10-CM | POA: Diagnosis not present

## 2022-03-23 DIAGNOSIS — M6281 Muscle weakness (generalized): Secondary | ICD-10-CM | POA: Diagnosis not present

## 2022-03-23 DIAGNOSIS — R2681 Unsteadiness on feet: Secondary | ICD-10-CM | POA: Diagnosis not present

## 2022-03-24 ENCOUNTER — Ambulatory Visit: Payer: Medicare Other | Admitting: Neurology

## 2022-03-25 ENCOUNTER — Ambulatory Visit: Payer: Medicare HMO | Admitting: Orthopaedic Surgery

## 2022-03-25 DIAGNOSIS — R2681 Unsteadiness on feet: Secondary | ICD-10-CM | POA: Diagnosis not present

## 2022-03-25 DIAGNOSIS — M6281 Muscle weakness (generalized): Secondary | ICD-10-CM | POA: Diagnosis not present

## 2022-03-29 DIAGNOSIS — M6281 Muscle weakness (generalized): Secondary | ICD-10-CM | POA: Diagnosis not present

## 2022-03-29 DIAGNOSIS — R2681 Unsteadiness on feet: Secondary | ICD-10-CM | POA: Diagnosis not present

## 2022-03-31 DIAGNOSIS — M6281 Muscle weakness (generalized): Secondary | ICD-10-CM | POA: Diagnosis not present

## 2022-03-31 DIAGNOSIS — R2681 Unsteadiness on feet: Secondary | ICD-10-CM | POA: Diagnosis not present

## 2022-04-01 DIAGNOSIS — H539 Unspecified visual disturbance: Secondary | ICD-10-CM | POA: Diagnosis not present

## 2022-04-01 DIAGNOSIS — G459 Transient cerebral ischemic attack, unspecified: Secondary | ICD-10-CM | POA: Diagnosis not present

## 2022-04-01 DIAGNOSIS — G20 Parkinson's disease: Secondary | ICD-10-CM | POA: Diagnosis not present

## 2022-04-01 DIAGNOSIS — R519 Headache, unspecified: Secondary | ICD-10-CM | POA: Diagnosis not present

## 2022-04-02 ENCOUNTER — Other Ambulatory Visit: Payer: Self-pay | Admitting: Internal Medicine

## 2022-04-02 DIAGNOSIS — G459 Transient cerebral ischemic attack, unspecified: Secondary | ICD-10-CM

## 2022-04-02 DIAGNOSIS — R2681 Unsteadiness on feet: Secondary | ICD-10-CM | POA: Diagnosis not present

## 2022-04-02 DIAGNOSIS — M6281 Muscle weakness (generalized): Secondary | ICD-10-CM | POA: Diagnosis not present

## 2022-04-05 DIAGNOSIS — M6281 Muscle weakness (generalized): Secondary | ICD-10-CM | POA: Diagnosis not present

## 2022-04-05 DIAGNOSIS — R2681 Unsteadiness on feet: Secondary | ICD-10-CM | POA: Diagnosis not present

## 2022-04-07 DIAGNOSIS — M6281 Muscle weakness (generalized): Secondary | ICD-10-CM | POA: Diagnosis not present

## 2022-04-07 DIAGNOSIS — R2681 Unsteadiness on feet: Secondary | ICD-10-CM | POA: Diagnosis not present

## 2022-04-09 DIAGNOSIS — M6281 Muscle weakness (generalized): Secondary | ICD-10-CM | POA: Diagnosis not present

## 2022-04-09 DIAGNOSIS — R2681 Unsteadiness on feet: Secondary | ICD-10-CM | POA: Diagnosis not present

## 2022-04-12 DIAGNOSIS — R2681 Unsteadiness on feet: Secondary | ICD-10-CM | POA: Diagnosis not present

## 2022-04-12 DIAGNOSIS — M6281 Muscle weakness (generalized): Secondary | ICD-10-CM | POA: Diagnosis not present

## 2022-04-14 ENCOUNTER — Other Ambulatory Visit (HOSPITAL_COMMUNITY): Payer: Self-pay | Admitting: Internal Medicine

## 2022-04-14 DIAGNOSIS — R2681 Unsteadiness on feet: Secondary | ICD-10-CM | POA: Diagnosis not present

## 2022-04-14 DIAGNOSIS — G459 Transient cerebral ischemic attack, unspecified: Secondary | ICD-10-CM

## 2022-04-14 DIAGNOSIS — M6281 Muscle weakness (generalized): Secondary | ICD-10-CM | POA: Diagnosis not present

## 2022-04-16 ENCOUNTER — Encounter (HOSPITAL_COMMUNITY): Payer: Medicare HMO

## 2022-04-18 ENCOUNTER — Other Ambulatory Visit: Payer: Self-pay | Admitting: Neurology

## 2022-04-19 ENCOUNTER — Ambulatory Visit (HOSPITAL_COMMUNITY)
Admission: RE | Admit: 2022-04-19 | Discharge: 2022-04-19 | Disposition: A | Discharge status: Home / Self Care | Payer: Medicare HMO | Source: Ambulatory Visit | Attending: Internal Medicine | Admitting: Internal Medicine

## 2022-04-19 ENCOUNTER — Encounter (HOSPITAL_COMMUNITY): Payer: Medicare HMO

## 2022-04-19 DIAGNOSIS — I10 Essential (primary) hypertension: Secondary | ICD-10-CM | POA: Diagnosis not present

## 2022-04-19 DIAGNOSIS — G459 Transient cerebral ischemic attack, unspecified: Secondary | ICD-10-CM

## 2022-04-19 NOTE — Progress Notes (Signed)
Carotid artery duplex completed. ?Refer to "CV Proc" under chart review to view preliminary results. ? ?04/19/2022 2:03 PM ?Kelby Aline., MHA, RVT, RDCS, RDMS   ?

## 2022-04-20 DIAGNOSIS — R2681 Unsteadiness on feet: Secondary | ICD-10-CM | POA: Diagnosis not present

## 2022-04-20 DIAGNOSIS — M6281 Muscle weakness (generalized): Secondary | ICD-10-CM | POA: Diagnosis not present

## 2022-04-21 DIAGNOSIS — M6281 Muscle weakness (generalized): Secondary | ICD-10-CM | POA: Diagnosis not present

## 2022-04-21 DIAGNOSIS — R2681 Unsteadiness on feet: Secondary | ICD-10-CM | POA: Diagnosis not present

## 2022-04-22 ENCOUNTER — Ambulatory Visit
Admission: RE | Admit: 2022-04-22 | Discharge: 2022-04-22 | Disposition: A | Discharge status: Home / Self Care | Payer: Medicare HMO | Source: Ambulatory Visit | Attending: Internal Medicine | Admitting: Internal Medicine

## 2022-04-22 DIAGNOSIS — G20 Parkinson's disease: Secondary | ICD-10-CM | POA: Diagnosis not present

## 2022-04-22 DIAGNOSIS — G459 Transient cerebral ischemic attack, unspecified: Secondary | ICD-10-CM

## 2022-04-22 MED ORDER — GADOBENATE DIMEGLUMINE 529 MG/ML IV SOLN
19.0000 mL | Freq: Once | INTRAVENOUS | Status: AC | PRN
  Administered 2022-04-22: 19 mL via INTRAVENOUS

## 2022-04-23 DIAGNOSIS — R2681 Unsteadiness on feet: Secondary | ICD-10-CM | POA: Diagnosis not present

## 2022-04-23 DIAGNOSIS — M6281 Muscle weakness (generalized): Secondary | ICD-10-CM | POA: Diagnosis not present

## 2022-04-26 DIAGNOSIS — M6281 Muscle weakness (generalized): Secondary | ICD-10-CM | POA: Diagnosis not present

## 2022-04-26 DIAGNOSIS — R2681 Unsteadiness on feet: Secondary | ICD-10-CM | POA: Diagnosis not present

## 2022-04-28 DIAGNOSIS — R2681 Unsteadiness on feet: Secondary | ICD-10-CM | POA: Diagnosis not present

## 2022-04-28 DIAGNOSIS — Z85828 Personal history of other malignant neoplasm of skin: Secondary | ICD-10-CM | POA: Diagnosis not present

## 2022-04-28 DIAGNOSIS — L298 Other pruritus: Secondary | ICD-10-CM | POA: Diagnosis not present

## 2022-04-28 DIAGNOSIS — L821 Other seborrheic keratosis: Secondary | ICD-10-CM | POA: Diagnosis not present

## 2022-04-28 DIAGNOSIS — L820 Inflamed seborrheic keratosis: Secondary | ICD-10-CM | POA: Diagnosis not present

## 2022-04-28 DIAGNOSIS — M6281 Muscle weakness (generalized): Secondary | ICD-10-CM | POA: Diagnosis not present

## 2022-04-28 DIAGNOSIS — L2089 Other atopic dermatitis: Secondary | ICD-10-CM | POA: Diagnosis not present

## 2022-04-30 DIAGNOSIS — M6281 Muscle weakness (generalized): Secondary | ICD-10-CM | POA: Diagnosis not present

## 2022-04-30 DIAGNOSIS — R2681 Unsteadiness on feet: Secondary | ICD-10-CM | POA: Diagnosis not present

## 2022-05-03 DIAGNOSIS — M6281 Muscle weakness (generalized): Secondary | ICD-10-CM | POA: Diagnosis not present

## 2022-05-03 DIAGNOSIS — R2681 Unsteadiness on feet: Secondary | ICD-10-CM | POA: Diagnosis not present

## 2022-05-05 DIAGNOSIS — R2681 Unsteadiness on feet: Secondary | ICD-10-CM | POA: Diagnosis not present

## 2022-05-05 DIAGNOSIS — M6281 Muscle weakness (generalized): Secondary | ICD-10-CM | POA: Diagnosis not present

## 2022-05-07 ENCOUNTER — Ambulatory Visit: Payer: Medicare HMO | Admitting: Podiatry

## 2022-05-10 DIAGNOSIS — M6281 Muscle weakness (generalized): Secondary | ICD-10-CM | POA: Diagnosis not present

## 2022-05-10 DIAGNOSIS — R2681 Unsteadiness on feet: Secondary | ICD-10-CM | POA: Diagnosis not present

## 2022-05-12 DIAGNOSIS — R2681 Unsteadiness on feet: Secondary | ICD-10-CM | POA: Diagnosis not present

## 2022-05-12 DIAGNOSIS — M6281 Muscle weakness (generalized): Secondary | ICD-10-CM | POA: Diagnosis not present

## 2022-05-14 DIAGNOSIS — M6281 Muscle weakness (generalized): Secondary | ICD-10-CM | POA: Diagnosis not present

## 2022-05-14 DIAGNOSIS — R2681 Unsteadiness on feet: Secondary | ICD-10-CM | POA: Diagnosis not present

## 2022-05-15 ENCOUNTER — Other Ambulatory Visit: Payer: Self-pay | Admitting: Neurology

## 2022-05-15 DIAGNOSIS — G20 Parkinson's disease: Secondary | ICD-10-CM

## 2022-05-17 ENCOUNTER — Other Ambulatory Visit: Payer: Self-pay

## 2022-05-17 DIAGNOSIS — R2681 Unsteadiness on feet: Secondary | ICD-10-CM | POA: Diagnosis not present

## 2022-05-17 DIAGNOSIS — M6281 Muscle weakness (generalized): Secondary | ICD-10-CM | POA: Diagnosis not present

## 2022-05-21 DIAGNOSIS — M6281 Muscle weakness (generalized): Secondary | ICD-10-CM | POA: Diagnosis not present

## 2022-05-21 DIAGNOSIS — R2681 Unsteadiness on feet: Secondary | ICD-10-CM | POA: Diagnosis not present

## 2022-05-24 ENCOUNTER — Encounter: Payer: Self-pay | Admitting: Physician Assistant

## 2022-05-24 ENCOUNTER — Ambulatory Visit (INDEPENDENT_AMBULATORY_CARE_PROVIDER_SITE_OTHER): Payer: Medicare HMO

## 2022-05-24 ENCOUNTER — Ambulatory Visit: Payer: Medicare HMO | Admitting: Physician Assistant

## 2022-05-24 DIAGNOSIS — M25572 Pain in left ankle and joints of left foot: Secondary | ICD-10-CM

## 2022-05-24 DIAGNOSIS — M79672 Pain in left foot: Secondary | ICD-10-CM | POA: Diagnosis not present

## 2022-05-24 MED ORDER — METHYLPREDNISOLONE ACETATE 40 MG/ML IJ SUSP
0.5000 mg | INTRAMUSCULAR | Status: AC | PRN
  Administered 2022-05-24: .5 mg via INTRA_ARTICULAR

## 2022-05-24 MED ORDER — LIDOCAINE HCL 1 % IJ SOLN
0.5000 mL | INTRAMUSCULAR | Status: AC | PRN
  Administered 2022-05-24: .5 mL

## 2022-05-24 NOTE — Progress Notes (Signed)
Office Visit Note   Patient: [name]           Date of Birth: 11/12/1941           MRN: [id number] Visit Date: 05/24/2022              Requested by: Ginger Organ., MD [address] [place],  [place] [id number] PCP: Ginger Organ., MD   Assessment & Plan: Visit Diagnoses:  1. Pain in left ankle and joints of left foot   2. Sinus tarsi syndrome of left ankle     Plan: Discussed with her the need to wear shoes with medial arch support.  She will follow-up with [place] if pain persist or becomes worse.  Questions encouraged and  Follow-Up Instructions: Return if symptoms worsen or fail to improve.   Orders:  Orders Placed This Encounter  Procedures   Foot Inj   Small Joint Inj   XR Ankle Complete Left   XR Foot Complete Left   No orders of the defined types were placed in this encounter.     Procedures: Small Joint Inj on 05/24/2022 3:59 PM Indications: pain Medications: 0.5 mL lidocaine 1 %; 0.5 mg methylPREDNISolone acetate 40 MG/ML Consent was given by the patient.       Clinical Data: No additional findings.   Subjective: Chief Complaint  Patient presents with   Left Ankle - Pain   Left Foot - Pain    HPI Mrs. [name] comes in today with left ankle pain.  States pain started 2 weeks ago.  Was awakened her more than once due to pain in the anterior aspect of the ankle.  She points to the anterior lateral aspect ankle as source of her pain.  States is soreness.  She has tried Tylenol.  She has had no new injuries.  She is using a walker to ambulate.  Negative for fevers chills.  Review of Systems See HPI.  Objective: Vital Signs: There were no vitals taken for this visit.  Physical Exam Constitutional:      Appearance: She is not ill-appearing or diaphoretic.  Pulmonary:     Effort: Pulmonary effort is normal.  Neurological:     Mental Status: She is alert and oriented to person, place, and time.  Psychiatric:        Mood and Affect:  Mood normal.     Ortho Exam Bilateral feet: No abnormal warmth erythema ecchymosis or impending ulcers.  Dorsal pedal pulses are present bilaterally.  Sensation grossly intact bilateral feet.  Nontender over the posterior tibial tendons peroneal tendons bilaterally.  No tenderness over the Achilles bilaterally.  Nontender over the medial tubercle of the calcaneus bilaterally.  Tenderness over the left sinus Tarsi region.  Calves are supple nontender bilaterally. Specialty Comments:  No specialty comments available.  Imaging: XR Ankle Complete Left  Result Date: 05/24/2022 Left ankle 3 views: Talus well located within the ankle mortise no diastases. No acute findings.   XR Foot Complete Left  Result Date: 05/24/2022 Left foot 3 views: No subluxations dislocations throughout the foot.  No acute fractures or bony lesions.  Sesamoids well located.  No significant arthritic changes.    PMFS History: Patient Active Problem List   Diagnosis Date Noted   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/06/2020   Unilateral primary osteoarthritis, left knee 02/21/2020   Depression 05/25/2019   Skin cancer 05/25/2019   Metatarsalgia of right foot 08/04/2018   Pulmonary embolus ([place])  05/19/2018   Pulmonary embolism ([place]) 05/18/2018   HTN (hypertension) 05/18/2018   Lumbar radiculopathy 11/13/2016   Hallux rigidus of right foot 05/19/2016   Onychomycosis of left great toe 05/19/2016   Gait disturbance 04/05/2016   Lumbar spinal stenosis 02/20/2016   Claudication ([place]) 02/03/2016   Ectropion of both eyes 04/07/2015   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   Vitreous syneresis of both eyes 04/07/2015   Sleep apnea, obstructive 07/29/2014   Acute laryngitis 07/24/2014   Allergic rhinitis 07/24/2014   Hearing loss 07/24/2014   Hip osteoarthritis 07/24/2014   Hoarseness 07/24/2014   Lumbar degenerative disc disease 07/24/2014   Lumbar spondylosis 07/24/2014   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    Urinary incontinence in female 07/24/2014   Epiphora due to insufficient drainage, bilateral 05/23/2014   Essential tremor 04/24/2014   Conjunctivochalasis 03/09/2012   Dermatochalasis 03/09/2012   Hyperopia with astigmatism and presbyopia 03/09/2012   Pinguecula of both eyes 03/09/2012   Past Medical History:  Diagnosis Date   Arthritis    arthritis in joints and back   Benign familial tremor    Clostridium difficile infection    DDD (degenerative disc disease), lumbar    Diverticulosis    DVT (deep venous thrombosis) (HCC)    left leg   Gallstones    GERD (gastroesophageal reflux disease)    Hypertension    Osteoporosis    Wears glasses     Family History  Problem Relation Age of Onset   Other Mother        sarcoma   Other Father        broken hip   Dementia Father    Pneumonia Father     Past Surgical History:  Procedure Laterality Date   ABDOMINAL HYSTERECTOMY  1983   arterial vascular fistula  2004   coiled at [place]  right   CHOLECYSTECTOMY N/A 03/29/2013   Procedure: LAPAROSCOPIC CHOLECYSTECTOMY WITH INTRAOPERATIVE CHOLANGIOGRAM;  Surgeon: Haywood Lasso, MD;  Location: [facility];  Service: General;  Laterality: N/A;   EUS N/A 03/09/2013   Procedure: UPPER ENDOSCOPIC ULTRASOUND (EUS) LINEAR;  Surgeon: Beryle Beams, MD;  Location: [facility];  Service: Endoscopy;  Laterality: N/A;   LUMBAR LAMINECTOMY/DECOMPRESSION MICRODISCECTOMY  11/30/2012   Procedure: LUMBAR LAMINECTOMY/DECOMPRESSION MICRODISCECTOMY 2 LEVELS;  Surgeon: Magnus Sinning, MD;  Location: [facility];  Service: Orthopedics;  Laterality: N/A;  DECOMPRESSION LAMINECTOMY L4-L5, L5-S1 CENTRAL    ROTATOR CUFF REPAIR Bilateral 2004   two on right, one on left   TOTAL KNEE ARTHROPLASTY Left 06/06/2020   Procedure: LEFT TOTAL KNEE ARTHROPLASTY;  Surgeon: [name], MD;  Location: [facility];  Service: Orthopedics;  Laterality:  Left;   TRIGGER FINGER RELEASE Right     hand   Social History   Occupational History   Occupation: Futures trader    Comment: partially retired  Tobacco Use   Smoking status: Former    Packs/day: 0.50    Types: Cigarettes    Quit date: 07/11/1970    Years since quitting: 51.9   Smokeless tobacco: Never  Vaping Use   Vaping Use: Never used  Substance and Sexual Activity   Alcohol use: Yes    Alcohol/week: 0.0 standard drinks of alcohol    Comment: wine occasionally   Drug use: No   Sexual activity: Not Currently

## 2022-05-26 DIAGNOSIS — R2681 Unsteadiness on feet: Secondary | ICD-10-CM | POA: Diagnosis not present

## 2022-05-26 DIAGNOSIS — M6281 Muscle weakness (generalized): Secondary | ICD-10-CM | POA: Diagnosis not present

## 2022-05-27 DIAGNOSIS — M6281 Muscle weakness (generalized): Secondary | ICD-10-CM | POA: Diagnosis not present

## 2022-05-27 DIAGNOSIS — R2681 Unsteadiness on feet: Secondary | ICD-10-CM | POA: Diagnosis not present

## 2022-06-02 DIAGNOSIS — R2681 Unsteadiness on feet: Secondary | ICD-10-CM | POA: Diagnosis not present

## 2022-06-02 DIAGNOSIS — M6281 Muscle weakness (generalized): Secondary | ICD-10-CM | POA: Diagnosis not present

## 2022-06-19 ENCOUNTER — Emergency Department (HOSPITAL_COMMUNITY): Payer: Medicare HMO

## 2022-06-19 ENCOUNTER — Emergency Department (HOSPITAL_COMMUNITY)
Admission: EM | Admit: 2022-06-19 | Discharge: 2022-06-20 | Disposition: A | Discharge status: Home / Self Care | Payer: Medicare HMO | Attending: Emergency Medicine | Admitting: Emergency Medicine

## 2022-06-19 ENCOUNTER — Other Ambulatory Visit: Payer: Self-pay

## 2022-06-19 ENCOUNTER — Encounter (HOSPITAL_COMMUNITY): Payer: Self-pay

## 2022-06-19 DIAGNOSIS — E871 Hypo-osmolality and hyponatremia: Secondary | ICD-10-CM | POA: Diagnosis not present

## 2022-06-19 DIAGNOSIS — Z79899 Other long term (current) drug therapy: Secondary | ICD-10-CM | POA: Insufficient documentation

## 2022-06-19 DIAGNOSIS — G20 Parkinson's disease: Secondary | ICD-10-CM | POA: Diagnosis not present

## 2022-06-19 DIAGNOSIS — J069 Acute upper respiratory infection, unspecified: Secondary | ICD-10-CM | POA: Diagnosis not present

## 2022-06-19 DIAGNOSIS — I10 Essential (primary) hypertension: Secondary | ICD-10-CM | POA: Diagnosis not present

## 2022-06-19 DIAGNOSIS — R739 Hyperglycemia, unspecified: Secondary | ICD-10-CM | POA: Insufficient documentation

## 2022-06-19 DIAGNOSIS — R0602 Shortness of breath: Secondary | ICD-10-CM | POA: Diagnosis not present

## 2022-06-19 DIAGNOSIS — R059 Cough, unspecified: Secondary | ICD-10-CM | POA: Diagnosis not present

## 2022-06-19 DIAGNOSIS — Z7901 Long term (current) use of anticoagulants: Secondary | ICD-10-CM | POA: Insufficient documentation

## 2022-06-19 DIAGNOSIS — B9789 Other viral agents as the cause of diseases classified elsewhere: Secondary | ICD-10-CM | POA: Diagnosis not present

## 2022-06-19 LAB — CBC WITH DIFFERENTIAL/PLATELET
Abs Immature Granulocytes: 0.06 10*3/uL (ref 0.00–0.07)
Basophils Absolute: 0.1 10*3/uL (ref 0.0–0.1)
Basophils Relative: 1 %
Eosinophils Absolute: 0.1 10*3/uL (ref 0.0–0.5)
Eosinophils Relative: 1 %
HCT: 38.6 % (ref 36.0–46.0)
Hemoglobin: 12.7 g/dL (ref 12.0–15.0)
Immature Granulocytes: 1 %
Lymphocytes Relative: 11 %
Lymphs Abs: 1.1 10*3/uL (ref 0.7–4.0)
MCH: 31.3 pg (ref 26.0–34.0)
MCHC: 32.9 g/dL (ref 30.0–36.0)
MCV: 95.1 fL (ref 80.0–100.0)
Monocytes Absolute: 1.6 10*3/uL — ABNORMAL HIGH (ref 0.1–1.0)
Monocytes Relative: 17 %
Neutro Abs: 6.6 10*3/uL (ref 1.7–7.7)
Neutrophils Relative %: 69 %
Platelets: 178 10*3/uL (ref 150–400)
RBC: 4.06 MIL/uL (ref 3.87–5.11)
RDW: 13.5 % (ref 11.5–15.5)
WBC: 9.4 10*3/uL (ref 4.0–10.5)
nRBC: 0 % (ref 0.0–0.2)

## 2022-06-19 LAB — COMPREHENSIVE METABOLIC PANEL
ALT: 7 U/L (ref 0–44)
AST: 13 U/L — ABNORMAL LOW (ref 15–41)
Albumin: 3.7 g/dL (ref 3.5–5.0)
Alkaline Phosphatase: 59 U/L (ref 38–126)
Anion gap: 9 (ref 5–15)
BUN: 16 mg/dL (ref 8–23)
CO2: 23 mmol/L (ref 22–32)
Calcium: 8.5 mg/dL — ABNORMAL LOW (ref 8.9–10.3)
Chloride: 99 mmol/L (ref 98–111)
Creatinine, Ser: 0.62 mg/dL (ref 0.44–1.00)
GFR, Estimated: >60 mL/min (ref >60)
Glucose, Bld: 112 mg/dL — ABNORMAL HIGH (ref 70–99)
Potassium: 3.8 mmol/L (ref 3.5–5.1)
Sodium: 131 mmol/L — ABNORMAL LOW (ref 135–145)
Total Bilirubin: 0.7 mg/dL (ref 0.3–1.2)
Total Protein: 7.2 g/dL (ref 6.5–8.1)

## 2022-06-19 LAB — LACTIC ACID, PLASMA: Lactic Acid, Venous: 0.9 mmol/L (ref 0.5–1.9)

## 2022-06-19 LAB — TROPONIN I (HIGH SENSITIVITY)
Troponin I (High Sensitivity): 6 ng/L
Troponin I (High Sensitivity): 6 ng/L (ref <18)

## 2022-06-19 MED ORDER — DEXAMETHASONE SODIUM PHOSPHATE 10 MG/ML IJ SOLN
10.0000 mg | Freq: Once | INTRAMUSCULAR | Status: AC
  Administered 2022-06-19: 10 mg via INTRAVENOUS
  Filled 2022-06-19: qty 1

## 2022-06-19 MED ORDER — IPRATROPIUM-ALBUTEROL 0.5-2.5 (3) MG/3ML IN SOLN
3.0000 mL | Freq: Once | RESPIRATORY_TRACT | Status: AC
  Administered 2022-06-19: 3 mL via RESPIRATORY_TRACT
  Filled 2022-06-19: qty 3

## 2022-06-19 MED ORDER — ALBUTEROL SULFATE HFA 108 (90 BASE) MCG/ACT IN AERS
1.0000 | INHALATION_SPRAY | Freq: Once | RESPIRATORY_TRACT | Status: AC
  Administered 2022-06-19: 2 via RESPIRATORY_TRACT
  Filled 2022-06-19: qty 6.7

## 2022-06-19 NOTE — ED Triage Notes (Addendum)
Ambulatory to ED with c/o cough x 3 days, increased weakness. Per visitor, neighbor stated that she seemed confused and shaky. Hx of parkinson's. Mild confusion in triage.

## 2022-06-19 NOTE — ED Notes (Signed)
EDP at bedside  

## 2022-06-19 NOTE — ED Provider Triage Note (Signed)
Emergency Medicine Provider Triage Evaluation Note  [name] , a 81 y.o. female  was evaluated in triage.  Pt complains of SOB with cough for the past few days. The patient lives at an independent living facilty when her neighbor called a family member saying she was more confused and shaky today. Per family, the patient is seeing her neurologist for this increase confusion that has been worsening over the past few months, but may be more acutely worsening recently. Denies any chest pain. Patient complains of generalized weakness.  Review of Systems  Positive:  Negative:   Physical Exam  BP (!) 163/72 (BP Location: Right Arm)   Pulse 78   Temp 99.7 F (37.6 C) (Oral)   Resp (!) 22   Ht '4\' 11"'$  (1.499 m)   Wt 99.8 kg   SpO2 92%   BMI 44.43 kg/m  Gen:   Awake, no distress   Resp:  Increased RR with some belly breathing. Patient has diffuse expiratory wheezing.  MSK:   Moves extremities without difficulty  Other:  A&O x 3  Medical Decision Making  Medically screening exam initiated at 8:29 PM.  Appropriate orders placed.  TABRINA ESTY was informed that the remainder of the evaluation will be completed by another provider, this initial triage assessment does not replace that evaluation, and the importance of remaining in the ED until their evaluation is complete.  Nursing alerted that patient needs to be roomed for her decrease O2 and elevated RR. Labs and imaging ordered.    Sherrell Puller, [place] 06/19/22 2032

## 2022-06-19 NOTE — ED Provider Notes (Signed)
[name]   CSN: [id number] Arrival date & time: 06/19/22  1948     History  Chief Complaint  Patient presents with   Cough   Weakness    [name] is a 81 y.o. female with a past medical history of Parkinson's, hypertension, who presents to the emergency department complaining of productive cough (phlegm) onset 2-3 days.  Also notes that she has had shortness of breath, generalized weakness, rhinorrhea.  No meds tried prior to arrival.  Denies past medical history of asthma or COPD.  Denies chest pain, nausea, vomiting, nasal congestion, sore throat.  Daughter is at bedside who notes that patient is at her baseline currently.  Also notes that patient's neighbor is unaware of patient's medical history and that is why patient's neighbor noted confusion.  Daughter notes that patient has an appoint with her primary care provider on 06/21/2022.  The history is provided by the patient and a relative. No language interpreter was used.       Home Medications Prior to Admission medications   Medication Sig Start Date End Date Taking? Authorizing Provider  acetaminophen (TYLENOL) 500 MG tablet Take 500 mg by mouth every 6 (six) hours as needed (for pain.).  Patient not taking: Reported on 09/16/2021    [provider]  amLODipine (NORVASC) 2.5 MG tablet Take 2.5 mg by mouth daily.  05/25/19   [provider]  apixaban (ELIQUIS) 5 MG TABS tablet Take 1 tablet (5 mg total) by mouth 2 (two) times daily. 06/10/20   [name], MD  busPIRone (BUSPAR) 10 MG tablet Take 10 mg by mouth 2 (two) times daily.  05/17/18   [provider]  carbidopa-levodopa (SINEMET IR) 25-100 MG tablet TAKE TWO TABLETS BY MOUTH EVERY MORNING (9AM), TAKE TWO TABLETS BY MOUTH DAILY AT  (1PM) AND TAKE ONE TABLET BY MOUTH DAILY AT (5PM) 04/19/22   Tat, Eustace Quail, DO  cholecalciferol (VITAMIN D) 25 MCG (1000 UNIT) tablet Take 1,000 Units  by mouth daily.    [provider]  donepezil (ARICEPT) 10 MG tablet Take 10 mg by mouth at bedtime.    [provider]  escitalopram (LEXAPRO) 20 MG tablet Take 20 mg by mouth daily.  02/26/16   [provider]  esomeprazole (NEXIUM) 40 MG capsule Take 40 mg by mouth 2 (two) times daily before a meal.  10/01/15   [provider]  loratadine (CLARITIN) 10 MG tablet Take 10 mg by mouth at bedtime.    [provider]  mirabegron ER (MYRBETRIQ) 50 MG TB24 tablet Take 50 mg by mouth at bedtime.  02/13/16   [provider]  nitrofurantoin (MACRODANTIN) 100 MG capsule Take 100 mg by mouth in the morning. Patient not taking: Reported on 03/05/2022 04/11/19   [provider]  olmesartan (BENICAR) 20 MG tablet Take 20 mg by mouth in the morning and at bedtime.     [provider]  propranolol (INDERAL) 20 MG tablet TAKE ONE TABLET BY MOUTH TWICE A DAY 05/17/22   Tat, Eustace Quail, DO  saccharomyces boulardii (FLORASTOR) 250 MG capsule Take 250 mg by mouth 2 (two) times daily. Patient not taking: Reported on 09/16/2021    [provider]  TOVIAZ 8 MG TB24 tablet Take 8 mg by mouth at bedtime.  05/19/18   [provider]  vitamin B-12 (CYANOCOBALAMIN) 1000 MCG tablet Take 1,000 mcg by mouth daily.    [provider]  carvedilol (COREG) 6.25 MG tablet  03/07/19 01/09/20  [provider]      Allergies    Buprenorphine hcl, Codeine, Morphine and related, Morphine, and Procaine    Review of Systems   Review of Systems  HENT:  Positive for rhinorrhea. Negative for congestion and sore throat.   Respiratory:  Positive for cough, shortness of breath and wheezing.   Cardiovascular:  Negative for chest pain.  Gastrointestinal:  Negative for nausea and vomiting.  Neurological:  Positive for weakness.  All other systems reviewed and are negative.   Physical Exam Updated Vital Signs BP (!) 162/59   Pulse 76   Temp  99.7 F (37.6 C) (Oral)   Resp (!) 26   Ht '4\' 11"'$  (1.499 m)   Wt 99.8 kg   SpO2 92%   BMI 44.43 kg/m  Physical Exam Vitals and nursing Mann reviewed.  Constitutional:      General: She is not in acute distress.    Appearance: She is not diaphoretic.  HENT:     Head: Normocephalic and atraumatic.     Mouth/Throat:     Pharynx: No oropharyngeal exudate.  Eyes:     General: No scleral icterus.    Conjunctiva/sclera: Conjunctivae normal.  Cardiovascular:     Rate and Rhythm: Normal rate and regular rhythm.     Pulses: Normal pulses.     Heart sounds: Normal heart sounds.  Pulmonary:     Effort: Pulmonary effort is normal. No respiratory distress.     Breath sounds: Wheezing present.     Comments: Diffuse expiratory wheezing noted throughout all lung fields Abdominal:     General: Bowel sounds are normal.     Palpations: Abdomen is soft. There is no mass.     Tenderness: There is no abdominal tenderness. There is no guarding or rebound.  Musculoskeletal:        General: Normal range of motion.     Cervical back: Normal range of motion and neck supple.     Comments: Strength and sensation intact to bilateral upper and lower extremities.  Skin:    General: Skin is warm and dry.  Neurological:     General: No focal deficit present.     Mental Status: She is alert.     Cranial Nerves: Cranial nerves 2-12 are intact.     Sensory: Sensation is intact.     Motor: Motor function is intact. No pronator drift.  Psychiatric:        Behavior: Behavior normal.     ED Results / Procedures / Treatments   Labs (all labs ordered are listed, but only abnormal results are displayed) Labs Reviewed  CBC WITH DIFFERENTIAL/PLATELET - Abnormal; Notable for the following components:      Result Value   Monocytes Absolute 1.6 (*)    All other components within normal limits  COMPREHENSIVE METABOLIC PANEL - Abnormal; Notable for the following components:   Sodium 131 (*)    Glucose, Bld  112 (*)    Calcium 8.5 (*)    AST 13 (*)    All other components within normal limits  CULTURE, BLOOD (ROUTINE X 2)  CULTURE, BLOOD (ROUTINE X 2)  LACTIC ACID, PLASMA  TROPONIN I (HIGH SENSITIVITY)  TROPONIN I (HIGH SENSITIVITY)    EKG None  Radiology DG Chest 2 View  Result Date: 06/19/2022 CLINICAL DATA:  Shortness of breath and cough for several days EXAM: CHEST - 2 VIEW COMPARISON:  12/02/2017 FINDINGS: Cardiac shadow is  enlarged but stable. Aortic calcifications are noted. Lungs are well aerated bilaterally. No focal infiltrate or sizable effusion is seen. Degenerative changes of the thoracic spine are noted. IMPRESSION: No active cardiopulmonary disease. Electronically Signed   By: Inez Catalina M.D.   On: 06/19/2022 21:00    Procedures Procedures    Medications Ordered in ED Medications  albuterol (VENTOLIN HFA) 108 (90 Base) MCG/ACT inhaler 1-2 puff (2 puffs Inhalation Given 06/19/22 2111)    ED Course/ Medical Decision Making/ A&P Clinical Course as of 06/20/22 5277  Nancy Fetter Jun 20, 2022  0031 Patient reevaluated.  Improvement of lung sounds with treatment regimen in the ED.  Discussed with patient and family members regarding repeat troponin levels. [SB]  0200 Discussed discharge treatment plan with patient and patient daughter at bedside.  Answered all available questions.  Patient appears safe for discharge at this time. [SB]    Clinical Course User Index [SB] Cadance Raus A, PA-C                            Medical Decision Making Amount and/or Complexity of Data Reviewed Labs: ordered.  Risk Prescription drug management.   Pt presents with concerns for productive cough, rhinorrhea, shortness of breath onset 2-3 days.  No chest pain at this time.  Denies past medical history of asthma or COPD.  Vital signs stable, patient afebrile.  On exam patient with diffuse expiratory wheezing noted throughout.  No acute cardiovascular exam findings.  Differential diagnosis  includes bronchitis, viral URI with cough, pneumonia, CHF.    Co morbidities that complicate the patient evaluation: HTN Parkinsons  Additional history obtained:  Additional history obtained from Daughter/Son   Labs:  I ordered, and personally interpreted labs.  The pertinent results include:   Initial troponin is 6, delta troponin at 6 Lactic acid at 0.9 and unremarkable. CMP with slightly decreased sodium at 131, slightly elevated glucose at 112, otherwise unremarkable. CBC unremarkable  Imaging: I ordered imaging studies including chest x-ray I independently visualized and interpreted imaging which showed: No acute cardiopulmonary findings I agree with the radiologist interpretation  Medications:  I ordered medication including albuterol inhaler, DuoNeb, Decadron for symptom management Reevaluation of the patient after these medicines and interventions, I reevaluated the patient and found that they have improved I have reviewed the patients home medicines and have made adjustments as needed  Disposition: Presentation suspicious for viral URI with cough.  Doubt pneumonia, asthma, COPD, CHF at this time. After consideration of the diagnostic results and the patients response to treatment, I feel that the patient would benefit from Discharge home.  Case discussed with attending who evaluated patient and agrees with discharge treatment plan.  Patient will be sent home with a prescription for Augmentin and prednisone.  Instructed patient to follow-up with primary care provider regarding today's ED visit.  Supportive care measures and strict return precautions discussed with patient at bedside. Pt acknowledges and verbalizes understanding. Pt appears safe for discharge. Follow up as indicated in discharge paperwork.    This chart was dictated using voice recognition software, Dragon. Despite the best efforts of this provider to proofread and correct errors, errors may still occur which  can change documentation meaning.  Final Clinical Impression(s) / ED Diagnoses Final diagnoses:  Viral URI with cough    Rx / DC Orders ED Discharge Orders          Ordered    amoxicillin-clavulanate (AUGMENTIN) 875-125 MG  tablet  Every 12 hours        06/20/22 0200    predniSONE (DELTASONE) 20 MG tablet  Daily        06/20/22 0200              Eira Alpert A, PA-C 06/20/22 0214    Mesner, Corene Cornea, MD 06/20/22 3748

## 2022-06-19 NOTE — ED Provider Notes (Signed)
I provided a substantive portion of the care of this patient.  I personally performed the entirety of the {Shared Choices:25152} for this encounter. {Remember to document shared critical care using "edcritical" dot phrase:1}    

## 2022-06-20 LAB — URINALYSIS, ROUTINE W REFLEX MICROSCOPIC
Bacteria, UA: NONE SEEN
Bilirubin Urine: NEGATIVE
Glucose, UA: NEGATIVE mg/dL
Ketones, ur: 20 mg/dL — AB
Leukocytes,Ua: NEGATIVE
Nitrite: NEGATIVE
Protein, ur: NEGATIVE mg/dL
Specific Gravity, Urine: 1.021 (ref 1.005–1.030)
pH: 5 (ref 5.0–8.0)

## 2022-06-20 LAB — BRAIN NATRIURETIC PEPTIDE: B Natriuretic Peptide: 54.3 pg/mL (ref 0.0–100.0)

## 2022-06-20 MED ORDER — IPRATROPIUM-ALBUTEROL 0.5-2.5 (3) MG/3ML IN SOLN
3.0000 mL | Freq: Once | RESPIRATORY_TRACT | Status: AC
  Administered 2022-06-20: 3 mL via RESPIRATORY_TRACT
  Filled 2022-06-20: qty 3

## 2022-06-20 MED ORDER — PREDNISONE 20 MG PO TABS: 40.0000 mg | ORAL_TABLET | Freq: Every day | ORAL | 0 refills | Status: AC

## 2022-06-20 MED ORDER — BENZONATATE 100 MG PO CAPS
100.0000 mg | ORAL_CAPSULE | Freq: Once | ORAL | Status: AC
  Administered 2022-06-20: 100 mg via ORAL
  Filled 2022-06-20: qty 1

## 2022-06-20 MED ORDER — AMOXICILLIN-POT CLAVULANATE 875-125 MG PO TABS
1.0000 | ORAL_TABLET | Freq: Two times a day (BID) | ORAL | 0 refills | Status: DC
Start: 2022-06-20 — End: 2023-03-15

## 2022-06-20 NOTE — ED Notes (Signed)
Patient verbalizes understanding of discharge instructions. Opportunity for questioning and answers were provided. Armband removed by staff, pt discharged from ED to home via POV  

## 2022-06-20 NOTE — Discharge Instructions (Addendum)
It was a pleasure taking care of you!   Your labs were unremarkable today.  Your chest xray didn't show pneumonia at this time. Ensure to maintain fluid intake with tea, soup, broth, Pedialyte, Gatorade, water. You will be sent a prescription for augmentin and prednisone, take as directed. Call your primary care provider and set up a follow up appointment regarding todays ED visit.  Return to the Emergency Department if you are experiencing trouble breathing, worsening or increasing chest pain, decreased fluid intake or worsening symptoms.

## 2022-06-21 DIAGNOSIS — G3184 Mild cognitive impairment, so stated: Secondary | ICD-10-CM | POA: Diagnosis not present

## 2022-06-21 DIAGNOSIS — G4733 Obstructive sleep apnea (adult) (pediatric): Secondary | ICD-10-CM | POA: Diagnosis not present

## 2022-06-21 DIAGNOSIS — J209 Acute bronchitis, unspecified: Secondary | ICD-10-CM | POA: Diagnosis not present

## 2022-06-21 DIAGNOSIS — R69 Illness, unspecified: Secondary | ICD-10-CM | POA: Diagnosis not present

## 2022-06-21 DIAGNOSIS — G20 Parkinson's disease: Secondary | ICD-10-CM | POA: Diagnosis not present

## 2022-06-25 LAB — CULTURE, BLOOD (ROUTINE X 2)
Culture: NO GROWTH
Culture: NO GROWTH

## 2022-07-15 ENCOUNTER — Other Ambulatory Visit: Payer: Self-pay | Admitting: Neurology

## 2022-07-29 ENCOUNTER — Encounter: Payer: Self-pay | Admitting: Neurology

## 2022-07-29 ENCOUNTER — Ambulatory Visit (INDEPENDENT_AMBULATORY_CARE_PROVIDER_SITE_OTHER): Payer: Medicare HMO | Admitting: Neurology

## 2022-07-29 VITALS — BP 111/66 | HR 84 | Ht 60.0 in | Wt 220.0 lb

## 2022-07-29 DIAGNOSIS — G4719 Other hypersomnia: Secondary | ICD-10-CM

## 2022-07-29 DIAGNOSIS — Z789 Other specified health status: Secondary | ICD-10-CM

## 2022-07-29 DIAGNOSIS — G4733 Obstructive sleep apnea (adult) (pediatric): Secondary | ICD-10-CM | POA: Diagnosis not present

## 2022-07-29 NOTE — Patient Instructions (Addendum)
It was nice to see you both today. As discussed, we will proceed with a home sleep test (HST) to re-establish your sleep apnea diagnosis and to get you a new machine. Our sleep lab staff will reach out to you to arrange for pickup and for a "tutorial" of your test equipment. I will write for a new machine after your HST confirms your obstructive sleep apnea. Please remember, you will not use your old CPAP the night of your testing. We will schedule a follow-up appointment after your new machine set-up date, typically within 31 to 89 days post treatment start. You will need to show compliance with usage and fulfill a minimum usage percentage.   Untreated obstructive sleep apnea when it is moderate to severe can have an adverse impact on cardiovascular health and raise her risk for heart disease, arrhythmias, hypertension, congestive heart failure, stroke and diabetes. Untreated obstructive sleep apnea causes sleep disruption, nonrestorative sleep, and sleep deprivation. This can have an impact on your day to day functioning and cause daytime sleepiness and impairment of cognitive function, memory loss, mood disturbance, and problems focussing. Using CPAP or a similar machine, called autoPAP regularly can improve these symptoms.

## 2022-07-29 NOTE — Progress Notes (Signed)
Subjective:    Patient ID: [name] is a 81 y.o. female.  HPI    Shirley Age, MD, PhD [facility] [address], [address] [place], [place] [address]  Dear Dr. Brigitte Mann,   I saw your patient, [name], upon your kind request, in my sleep clinic today for evaluation of her obstructive sleep apnea.  The patient is accompanied by her daughter, [name], today.  As you know, [name] is an 81 year old right-handed woman with an underlying medical history of arthritis, degenerative disc disease, diverticulosis, history of DVT, reflux disease, hypertension, osteoporosis, tremor and parkinsonism (followed by Dr. Carles Mann at [facility]), and severe obesity with a BMI over 71, who was previously diagnosed with obstructive sleep apnea.  She was on CPAP therapy in the past.  I had evaluated her in 2015 for sleep apnea.  She had a split-night sleep study on 06/05/2014.  Her baseline AHI was 60.6/h at the time.  Oxygen nadir was 84%.  She was on CPAP therapy at a pressure of 6 cm. Set up date was 08/20/14.  She has not been seen in this clinic since 2016.  I reviewed your office note from 06/21/2022.  She reported increase in daytime somnolence and trouble sleeping at night. Her Epworth sleepiness score is 13 out of 24, fatigue severity score is 57 out of 63.  She has not used her CPAP in about 3 years.  She would be willing to get restarted on CPAP therapy.   There was limited compliance data in the month of March 2020, total of 16 days, pressure of 6 cm, average AHI 6.7/h, average usage 8 hours and 20 minutes.  She reports taking naps during the day.  She reports having trouble with her CPAP in the past and eventually gave up using it.  She was having trouble getting new supplies as well as understand.  Bedtime is currently around 11 and rise time around 8.  She lives in independent living at [facility].  She has CNAs come in for a few hours every day.  She drinks caffeine in the  form of coffee, 1 cup/day and tea about 2 to 3 cups/day on average.  She does not drink any daily soda.  She does not drink any alcohol currently.  She quit smoking over 40 years ago.  Her son has sleep apnea and has a CPAP machine.  She lost her husband about 2 years ago.  She has woken up confused at times.  She denies recurrent morning or nocturnal headaches, no night to night nocturia but does have hyperactive bladder.  Her weight has been fluctuating.  She does admit to eating ice cream almost every day.  She uses a rolling walker or an Transport planner.  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 Carles Mann), who presents for follow up consultation of her OSA, treated with CPAP. She is unaccompanied today.     I reviewed her CPAP compliance data from 09/01/2015 through 09/30/2015 which is a total of 30 days during which time she used her machine every night with percent used days greater than 4 hours at 100%, indicating superb compliance with an average usage  of 8 hours and 44 minutes, residual AHI low at 1.5 per hour, leaked low at 5.6 L/m for the 95th percentile pressure of 6 cm with EPR.   She reports that she has no problems with her CPAP machine. She is compliant with treatment. Her nocturia has become a little worse. She has had more daytime somnolence in the past couple of months or maybe longer. She is worried about the CPAP not working properly. She has had some more stress, particularly with her husband's health issues. In August 2016 she saw Dr. Carles Mann for tremor follow-up and I reviewed the note. She was started on propranolol LA  80 mg once daily. The patient feels that the propranolol addition has helped her tremors. She saw her primary care physician on 09/30/2015 and talked about the increase in daytime somnolence. She was taken off of the Wellbutrin because it may have been causing her night sweats. She had some blood work at the time including TSH. Results are pending.   I saw her on 04/14/2015, at which time she reported doing well on CPAP. She had improvement of her nocturia. She felt that her sleep was better consolidated. She was wearing about her husband health in his cognitive decline. She reported having 2 biological children and her husband had 8 children from his previous marriage. They were married for 25 years. Her tremor was fluctuating and generally speaking responsive to Mysoline but she was not able to increase the dose. She was getting too sleepy from the Mysoline. She was taking 2 pills at night only. She was reporting stress and seeing a therapist was helpful. She was trying to lose weight.    I saw her on 10/10/2014, at which time she reported doing fairly well, with some residual daytime tiredness. She reported some stress. She was supposed to meet with a therapist soon. She has been on Cymbalta. She reported improved nocturia, and her sleep was more consolidated. She did have an increase in her Mysoline dose right around the same time she started CPAP therapy. She was fully compliant with treatment and I encouraged her to continue with treatment. We talked about the sleep test results at the time.   I reviewed her CPAP compliance data from 03/12/2015 through 04/10/2015 which is a total of 30 days during which time she used her machine every night with percent used days greater than 4 hours at 100%, indicating superb compliance, with an average usage of 8 hours and 12 minutes, pressure of 6 cm, residual AHI low at 1.6 per hour and leak low, with the 95th percentile of leak at 4 L/m     I first met her on  04/29/14, at the request of her PCP, at which time she reported excessive daytime somnolence, nocturia, and snoring, (per husband). I suggested she return for sleep study. She had a split-night sleep study on 06/05/2014 and I went over her test results with her in detail today. Her baseline sleep efficiency was 68.6% with a latency to sleep of 26 minutes and wake after sleep onset of 16.5 minutes with mild sleep fragmentation noted. She had an elevated arousal index. She had an elevated percentage of light stage sleep, absence of deep sleep, and absence of REM sleep. She had no significant EEG changes and had rare PVCs and PVCs. She had a total AHI of 60.6 per hour. Baseline oxygen saturation was 90% with a nadir of 84%. She was titrated on CPAP. Sleep efficiency was improved during the  treatment portion of the study. Arousal index was markedly improved. She achieved deep sleep and REM sleep during the treatment portion of the study. Average oxygen saturation was 91% with a nadir of 84%. Time below 88% saturation during the treatment portion of the study was 3 minutes and 35 seconds. She was titrated from 4-6 cm with a reduction of her AHI to 0.9 events at the final pressure. Supine REM sleep was achieved. Based on her sleep study results I ordered CPAP treatment for her.   I reviewed her compliance data from 08/20/2014 through 09/18/2014 which is a total of 30 days during which time she used her CPAP every night with percent used days greater than 4 hours at 100%, indicating superb compliance, average usage of 8 hours and 10 minutes, pressure at 6 cm with EPR of 3, residual AHI low at 1.9 per hour and leak was low at 4.3 L/m for the 95th percentile.   I reviewed her compliance data from 09/10/2014 through 10/09/2014 which is a total of 30 days during which time she was 100% compliant. Average usage of 8 hours and 17 minutes, residual AHI at 1.8 per hour, leak low. Pressure at 6 cm with EPR of 3.    She has  been on Effexor XR 225 mg daily and tapered off of it over the course of a week and was started on Cymbalta 30 mg. She is retired as an Futures trader, but still works some. She has had some additional stressors. She endorses EDS and her ESS is 10/24. She has not fallen asleep driving. Her husband is 9 years old, and recently had eye surgery. She denies gasping for air and is not sure that she has witnessed apneas. She does not have morning headaches, but has had some headaches since she has been on Mobic. She has nocturia x 2, which is better since the ABx. She drinks caffeine, but has cut back on her sodas to none after 2 PM. She likes Coke zero. She does not take a scheduled nap. She denies RLS symptoms, and is not sure if she kicks in her sleep. She goes to bed by 10 PM and likes to read on her Melanee Spry, and she is asleep by 11 PM. She has taken the occasional tylenol PM.   There is no report of nighttime reflux, now that she is on Zegerid. There Is family history of RLS in her mother and suspected OSA in her father, who also had a tremor.   She is not a very restless sleeper. She likes to sleep on her right side, but can sleep on her stomach and her back.   She denies cataplexy, sleep paralysis, hypnagogic or hypnopompic hallucinations, or sleep attacks. She does not report any vivid dreams, nightmares, dream enactments, or parasomnias, such as sleep talking or sleep walking. The patient has not had a sleep study or a home sleep test. Her bedroom is usually dark and cool. There is a TV in the bedroom and usually it is not on at night.   Her Past Medical History Is Significant For: Past Medical History:  Diagnosis Date   Arthritis    arthritis in joints and back   Benign familial tremor    Clostridium difficile infection    DDD (degenerative disc disease), lumbar    Diverticulosis    DVT (deep venous thrombosis) (HCC)    left leg   Gallstones    GERD (gastroesophageal reflux disease)     Hypertension  Osteoporosis    Wears glasses     Her Past Surgical History Is Significant For: Past Surgical History:  Procedure Laterality Date   ABDOMINAL HYSTERECTOMY  1983   arterial vascular fistula  2004   coiled at [place]  right   CHOLECYSTECTOMY N/A 03/29/2013   Procedure: LAPAROSCOPIC CHOLECYSTECTOMY WITH INTRAOPERATIVE CHOLANGIOGRAM;  Surgeon: Haywood Lasso, MD;  Location: [facility];  Service: General;  Laterality: N/A;   EUS N/A 03/09/2013   Procedure: UPPER ENDOSCOPIC ULTRASOUND (EUS) LINEAR;  Surgeon: Beryle Beams, MD;  Location: [facility];  Service: Endoscopy;  Laterality: N/A;   LUMBAR LAMINECTOMY/DECOMPRESSION MICRODISCECTOMY  11/30/2012   Procedure: LUMBAR LAMINECTOMY/DECOMPRESSION MICRODISCECTOMY 2 LEVELS;  Surgeon: Magnus Sinning, MD;  Location: [facility];  Service: Orthopedics;  Laterality: N/A;  DECOMPRESSION LAMINECTOMY L4-L5, L5-S1 CENTRAL    ROTATOR CUFF REPAIR Bilateral 2004   two on right, one on left   TOTAL KNEE ARTHROPLASTY Left 06/06/2020   Procedure: LEFT TOTAL KNEE ARTHROPLASTY;  Surgeon: [name], MD;  Location: [facility];  Service: Orthopedics;  Laterality: Left;   TRIGGER FINGER RELEASE Right     hand    Her Family History Is Significant For: Family History  Problem Relation Mann of Onset   Other Mother        sarcoma   Other Father        broken hip   Dementia Father    Pneumonia Father    Sleep apnea Neg Hx     Her Social History Is Significant For: Social History   Socioeconomic History   Marital status: Married    Spouse name: Richard   Number of children: 2   Years of education: 30   Highest education level: Not on file  Occupational History   Occupation: Futures trader    Comment: partially retired  Tobacco Use   Smoking status: Former    Packs/day: 0.50    Types: Cigarettes    Quit date: 07/11/1970    Years since quitting: 52.0    Smokeless tobacco: Never  Vaping Use   Vaping Use: Never used  Substance and Sexual Activity   Alcohol use: Yes    Alcohol/week: 0.0 standard drinks of alcohol    Comment: wine occasionally   Drug use: No   Sexual activity: Not Currently  Other Topics Concern   Not on file  Social History Narrative   Patient is right handed, resides with spouse in a home.   1 cup of coffee a day    Social Determinants of Health   Financial Resource Strain: Not on file  Food Insecurity: Not on file  Transportation Needs: Not on file  Physical Activity: Not on file  Stress: Not on file  Social Connections: Not on file    Her Allergies Are:  Allergies  Allergen Reactions   Buprenorphine Hcl Nausea And Vomiting   Codeine Nausea And Vomiting    Other reaction(s): Other (See Comments) But tolerates Tramadol But tolerates Tramadol Vomiting    Morphine And Related Nausea And Vomiting   Morphine     Other reaction(s): Other (See Comments) Vomiting   Procaine     Other reaction(s): Other (See Comments) Other reaction(s): Shakes Novacaine-Shakes  :   Her Current Medications Are:  Outpatient Encounter Medications as of 07/29/2022  Medication Sig   acetaminophen (TYLENOL) 500 MG tablet Take 500 mg by mouth every 6 (six) hours as needed (  for pain.).   amLODipine (NORVASC) 2.5 MG tablet Take 2.5 mg by mouth daily.    apixaban (ELIQUIS) 5 MG TABS tablet Take 1 tablet (5 mg total) by mouth 2 (two) times daily.   busPIRone (BUSPAR) 10 MG tablet Take 10 mg by mouth 2 (two) times daily.    carbidopa-levodopa (SINEMET IR) 25-100 MG tablet TAKE 2 TABLETS BY MOUTH AT 9 A.M., 2 TABLETS AT 1 P.M., AND 1 TABLET AT 5 P.M. AS DIRECTED   cholecalciferol (VITAMIN D) 25 MCG (1000 UNIT) tablet Take 1,000 Units by mouth daily.   donepezil (ARICEPT) 10 MG tablet Take 10 mg by mouth at bedtime.   escitalopram (LEXAPRO) 20 MG tablet Take 20 mg by mouth daily.    esomeprazole (NEXIUM) 40 MG capsule Take 40 mg by  mouth 2 (two) times daily before a meal.    loratadine (CLARITIN) 10 MG tablet Take 10 mg by mouth at bedtime.   mirabegron ER (MYRBETRIQ) 50 MG TB24 tablet Take 50 mg by mouth at bedtime.    olmesartan (BENICAR) 20 MG tablet Take 20 mg by mouth in the morning and at bedtime.    propranolol (INDERAL) 20 MG tablet TAKE ONE TABLET BY MOUTH TWICE A DAY (Patient taking differently: Take 20 mg by mouth 2 (two) times daily.)   TOVIAZ 8 MG TB24 tablet Take 8 mg by mouth at bedtime.    vitamin B-12 (CYANOCOBALAMIN) 1000 MCG tablet Take 1,000 mcg by mouth daily.   amoxicillin-clavulanate (AUGMENTIN) 875-125 MG tablet Take 1 tablet by mouth every 12 (twelve) hours.   [DISCONTINUED] carvedilol (COREG) 6.25 MG tablet    No facility-administered encounter medications on file as of 07/29/2022.  :   Review of Systems:  Out of a complete 14 point review of systems, all are reviewed and negative with the exception of these symptoms as listed below:   Review of Systems  Neurological:        Pt here for sleep consult  Pt snores,fatigue,hypertension  Pt denies headaches . Pt states she stop using CPAP machine 3 years ago and last sleep study was 2017     ESS FSS:    Objective:  Neurological Exam  Physical Exam Physical Examination:   Vitals:   07/29/22 0908  BP: 111/66  Mann: 84    General Examination: The patient is a very pleasant 81 y.o. female in no acute distress. She appears well-developed and well-nourished and well groomed.   HEENT: Normocephalic, atraumatic, pupils are equal, round and reactive to light, extraocular tracking is fairly well-preserved, hearing grossly intact, speech volume is softer, intermittent trembling noted in her voice.  Airway examination reveals mild to moderate mouth dryness, moderate airway crowding, Mallampati class II, tonsils on the smaller side.  Neck circumference 17-1/2 inches.    Chest: Clear to auscultation without wheezing, rhonchi or crackles  noted.   Heart: S1+S2+0, regular and normal without murmurs, rubs or gallops noted.    Abdomen: Soft, non-tender and non-distended.   Extremities: There is trace to 1+ pitting edema in the distal lower extremities bilaterally.   Skin: Warm and dry without trophic changes noted. There are no varicose veins.   Musculoskeletal: exam reveals no obvious joint deformities.    Neurologically:  Mental status: The patient is awake, alert and oriented in all 4 spheres. Her immediate and remote memory, attention, language skills and fund of knowledge are appropriate. There is no evidence of aphasia, agnosia, apraxia or anomia. Speech is clear with normal prosody and  enunciation. Thought process is linear. Mood is normal and affect is normal.  Cranial nerves II - XII are as described above under HEENT exam.  Motor exam: Normal bulk, and good global strength noted. There is a mild resting tremor in the RUE. Fine motor skills and coordination: mildly impaired.  Cerebellar testing: No dysmetria or intention tremor.  Sensory exam: intact to light touch.  Gait, station and balance: She uses a 4 wheeled walker with seat.             Assessment and Plan:  In summary, [name] is a very pleasant 81 y.o.-year old female with an underlying medical history of arthritis, degenerative disc disease, diverticulosis, history of DVT, reflux disease, hypertension, osteoporosis, tremor and parkinsonism (followed by Dr. Carles Mann at [facility]), and severe obesity with a BMI over 62, who presents for evaluation of her obstructive sleep apnea (OSA). She was diagnosed with severe OSA in 2015, but has not used CPAP in about 3 years.   I had a long chat with the patient and [name] about my findings and the diagnosis of sleep apnea, particularly OSA, its prognosis and treatment options. We talked about medical/conservative treatments, surgical interventions and non-pharmacological approaches for symptom control. I explained, in  particular, the risks and ramifications of untreated moderate to severe OSA, especially with respect to developing cardiovascular disease down the road, including congestive heart failure (CHF), difficult to treat hypertension, cardiac arrhythmias (particularly A-fib), neurovascular complications including TIA, stroke and dementia. Even type 2 diabetes has, in part, been linked to untreated OSA. Symptoms of untreated OSA may include (but may not be limited to) daytime sleepiness, nocturia (i.e. frequent nighttime urination), memory problems, mood irritability and suboptimally controlled or worsening mood disorder such as depression and/or anxiety, lack of energy, lack of motivation, physical discomfort, as well as recurrent headaches, especially morning or nocturnal headaches. We talked about the importance of maintaining a healthy lifestyle and striving for healthy weight.  I recommended the following at this time: sleep study.  I outlined the differences between a laboratory attended sleep study which is considered more comprehensive and accurate over the option of a home sleep test (HST); the latter may lead to underestimation of sleep disordered breathing in some instances and does not help with diagnosing upper airway resistance syndrome and is not accurate enough to diagnose primary central sleep apnea typically. We mutually agreed to proceed with a home sleep test for reevaluation. I explained the different sleep test procedures to the patient in detail and also outlined possible surgical and non-surgical treatment options of OSA, including the use of a pressure airway pressure (PAP) device (ie CPAP, AutoPAP/APAP or BiPAP in certain circumstances), a custom-made dental device (aka oral appliance, which would require a referral to a specialist dentist or orthodontist typically, and is generally speaking not considered a good choice for patients with full dentures or edentulous state), upper airway surgical  options, such as traditional UPPP (which is not considered a first-line treatment) or the Inspire device (hypoglossal nerve stimulator, which would involve a referral for consultation with an ENT surgeon, after careful selection, following inclusion criteria). I explained the PAP treatment option to the patient in detail, as this is generally considered first-line treatment.  The patient indicated that she would be willing to use PAP therapy again, if the need arises. I explained the importance of being compliant with PAP treatment, not only for insurance purposes but primarily to improve patient's symptoms symptoms, and for the patient's long  term health benefit, including to reduce Her cardiovascular risks longer-term.    We will pick up our discussion about the next steps and treatment options after testing.  We will keep them posted as to the test results by phone call and/or MyChart messaging where possible.  We will plan to follow-up in sleep clinic accordingly as well.  I answered all their questions today and the patient and her daughter were in agreement.   I encouraged them to call with any interim questions, concerns, problems or updates or email [place] through [place].  Generally speaking, sleep test authorizations may take up to 2 weeks, sometimes less, sometimes longer, the patient is encouraged to get in touch with [place] if they do not hear back from the sleep lab staff directly within the next 2 weeks.  Thank you very much for allowing me to participate in the care of this nice patient. If I can be of any further assistance to you please do not hesitate to call me at [phone].  Sincerely,   Shirley Age, MD, PhD

## 2022-08-11 ENCOUNTER — Encounter: Payer: Self-pay | Admitting: Neurology

## 2022-08-12 ENCOUNTER — Telehealth: Payer: Self-pay | Admitting: Neurology

## 2022-08-12 NOTE — Telephone Encounter (Signed)
LVM for pt to call back to schedule   [employer] medic[employer] no Josem Kaufmann req spoke to [facility] ref # [id number]

## 2022-08-12 NOTE — Telephone Encounter (Signed)
Daughter Danton Clap will pick up [place] no Josem Kaufmann req spoke to [facility] ref # [id number].Marland Kitchen  Scheduled for 08/30/22 at 3:30 pm.  Daughter requested I mail her the packet

## 2022-08-12 NOTE — Telephone Encounter (Signed)
Daughter Danton Clap will pick up [place] no Josem Kaufmann req spoke to [facility] ref # [id number].Marland Kitchen  Scheduled for 08/30/22 at 3:30 pm.  Daughter requested I mail her the packet

## 2022-08-13 DIAGNOSIS — J1189 Influenza due to unidentified influenza virus with other manifestations: Secondary | ICD-10-CM | POA: Diagnosis not present

## 2022-08-26 DIAGNOSIS — M810 Age-related osteoporosis without current pathological fracture: Secondary | ICD-10-CM | POA: Diagnosis not present

## 2022-08-26 DIAGNOSIS — R7989 Other specified abnormal findings of blood chemistry: Secondary | ICD-10-CM | POA: Diagnosis not present

## 2022-08-26 DIAGNOSIS — R69 Illness, unspecified: Secondary | ICD-10-CM | POA: Diagnosis not present

## 2022-08-26 DIAGNOSIS — I10 Essential (primary) hypertension: Secondary | ICD-10-CM | POA: Diagnosis not present

## 2022-08-30 ENCOUNTER — Ambulatory Visit (INDEPENDENT_AMBULATORY_CARE_PROVIDER_SITE_OTHER): Payer: Medicare HMO | Admitting: Neurology

## 2022-08-30 DIAGNOSIS — G4734 Idiopathic sleep related nonobstructive alveolar hypoventilation: Secondary | ICD-10-CM

## 2022-08-30 DIAGNOSIS — G4719 Other hypersomnia: Secondary | ICD-10-CM

## 2022-08-30 DIAGNOSIS — G4733 Obstructive sleep apnea (adult) (pediatric): Secondary | ICD-10-CM

## 2022-08-30 DIAGNOSIS — Z789 Other specified health status: Secondary | ICD-10-CM

## 2022-09-01 NOTE — Progress Notes (Signed)
See procedure note.

## 2022-09-01 NOTE — Addendum Note (Signed)
Addended by: [name] on: 09/01/2022 05:34 PM   Modules accepted: Orders

## 2022-09-01 NOTE — Procedures (Signed)
[facility]  HOME SLEEP TEST (Watch PAT) REPORT  STUDY DATE: 08/30/2022  DOB: [birth year]  MRN: [id number]  ORDERING CLINICIAN: [name], MD, PhD   REFERRING CLINICIAN: Ginger Organ., MD   CLINICAL INFORMATION/HISTORY: 81 year old right-handed woman with an underlying medical history of arthritis, degenerative disc disease, diverticulosis, history of DVT, reflux disease, hypertension, osteoporosis, tremor and parkinsonism (followed by Dr. Carles Collet at [facility]), and severe obesity with a BMI over 37, who was previously diagnosed with obstructive sleep apnea.  She was on CPAP therapy in the past.   BMI: 43.3 kg/m  FINDINGS:   Sleep Summary:   Total Recording Time (hours, min): 9 hours, 33 min  Total Sleep Time (hours, min):  8 hours, 44 min  Percent REM (%):    5.3%   Respiratory Indices:   Calculated pAHI (per hour):  82.5/hour         REM pAHI:    n/a       Central pAHI: 3.5/hour  Oxygen Saturation Statistics:    Oxygen Saturation (%) Mean: 91%   Minimum oxygen saturation (%):                 84%   O2 Saturation Range (%): 84-98%    O2 Saturation (minutes) <=88%: 44.3 min  Pulse Rate Statistics:   Pulse Mean (bpm):    74/min    Pulse Range (58-103/min)   IMPRESSION:  OSA (obstructive sleep apnea), severe Nocturnal hypoxemia  RECOMMENDATION:  This home sleep test demonstrates severe obstructive sleep apnea with a total AHI of 82.5/hour and O2 nadir of 84% with significant time below or at 88% saturation of over 30 minutes for the night, indicating nocturnal hypoxemia.  Mild to moderate snoring was detected throughout the night, at times in the loud range.  Treatment with positive airway pressure is highly recommended. This will require - ideally - a full night CPAP titration study for proper treatment settings, O2 monitoring and mask fitting. For now, the patient will be advised to proceed with an autoPAP titration/trial at home.  A  laboratory attended titration study can be considered in the future for optimization of her treatment and better tolerance of therapy.  Alternative treatment options are limited secondary to the severity of the patient's sleep disordered breathing.  Please note, that untreated obstructive sleep apnea may carry additional perioperative morbidity. Patients with significant obstructive sleep apnea should receive perioperative PAP therapy and the surgeons and particularly the anesthesiologist should be informed of the diagnosis and the severity of the sleep disordered breathing. The patient should be cautioned not to drive, work at heights, or operate dangerous or heavy equipment when tired or sleepy. Review and reiteration of good sleep hygiene measures should be pursued with any patient. Other causes of the patient's symptoms, including circadian rhythm disturbances, an underlying mood disorder, medication effect and/or an underlying medical problem cannot be ruled out based on this test. Clinical correlation is recommended. The patient and her referring provider will be notified of the test results. The patient will be seen in follow up in sleep clinic at [facility].  I certify that I have reviewed the raw data recording prior to the issuance of this report in accordance with the standards of the American Academy of Sleep Medicine (AASM).  INTERPRETING PHYSICIAN:   [name], MD, PhD  Board Certified in Neurology and Sleep Medicine  [facility] [address], [place], [place] [address] [phone]

## 2022-09-02 ENCOUNTER — Telehealth: Payer: Self-pay | Admitting: *Deleted

## 2022-09-02 ENCOUNTER — Encounter: Payer: Self-pay | Admitting: *Deleted

## 2022-09-02 DIAGNOSIS — G3184 Mild cognitive impairment, so stated: Secondary | ICD-10-CM | POA: Diagnosis not present

## 2022-09-02 DIAGNOSIS — Z86718 Personal history of other venous thrombosis and embolism: Secondary | ICD-10-CM | POA: Diagnosis not present

## 2022-09-02 DIAGNOSIS — F3342 Major depressive disorder, recurrent, in full remission: Secondary | ICD-10-CM | POA: Diagnosis not present

## 2022-09-02 DIAGNOSIS — R2681 Unsteadiness on feet: Secondary | ICD-10-CM | POA: Diagnosis not present

## 2022-09-02 DIAGNOSIS — Z7901 Long term (current) use of anticoagulants: Secondary | ICD-10-CM | POA: Diagnosis not present

## 2022-09-02 DIAGNOSIS — I10 Essential (primary) hypertension: Secondary | ICD-10-CM | POA: Diagnosis not present

## 2022-09-02 DIAGNOSIS — D6869 Other thrombophilia: Secondary | ICD-10-CM | POA: Diagnosis not present

## 2022-09-02 DIAGNOSIS — M810 Age-related osteoporosis without current pathological fracture: Secondary | ICD-10-CM | POA: Diagnosis not present

## 2022-09-02 DIAGNOSIS — R7301 Impaired fasting glucose: Secondary | ICD-10-CM | POA: Diagnosis not present

## 2022-09-02 DIAGNOSIS — Z0000 Encounter for general adult medical examination without abnormal findings: Secondary | ICD-10-CM | POA: Diagnosis not present

## 2022-09-02 DIAGNOSIS — R82998 Other abnormal findings in urine: Secondary | ICD-10-CM | POA: Diagnosis not present

## 2022-09-02 DIAGNOSIS — G20 Parkinson's disease: Secondary | ICD-10-CM | POA: Diagnosis not present

## 2022-09-02 NOTE — Telephone Encounter (Signed)
Pt's daughter returned call. Please call back when available.

## 2022-09-02 NOTE — Telephone Encounter (Signed)
Called pt's daughter Alyce (on [place], main # on chart) and LVM asking for call back.

## 2022-09-02 NOTE — Telephone Encounter (Signed)
Spoke with patient's daughter and discussed sleep study results.  She is amenable to proceeding with AutoPap therapy.  We discussed the difference between AutoPap and CPAP.  We discussed the next steps which includes sending the order over to a durable medical equipment company.  She did not have a specific preference for one.  We will use [facility].  We discussed insurance compliance requirements which includes using the machine at least 4 hours every night and also being seen in the office for an initial follow-up appointment between 30 and 90 days after set up.  The patient was scheduled for an appointment on Tuesday, November 28 at 1:15 PM with [name].  The daughter's questions were answered and she verbalized appreciation.  AutoPap referral has been faxed to [facility].  Received a receipt of confirmation. Follow-up letter sent to patient's MyChart. Sleep study result forwarded to referring provider.

## 2022-09-02 NOTE — Telephone Encounter (Signed)
-----   Message from [name], MD sent at 09/01/2022  5:34 PM EDT ----- Urgent set up requested. Patient referred by PCP for reevaluation of her OSA, seen by me on 07/29/2022, patient had a HST on 08/30/2022.    Please call and notify the patient that the recent home sleep test showed obstructive sleep apnea in the severe range. I recommend treatment for this in the form of autoPAP, which means, that we don't have to bring her in for a sleep study with CPAP, but will let her start using a so called autoPAP machine at home, through a DME company (of her choice, or as per insurance requirement). The DME representative will fit the patient with a mask of choice, educate her on how to use the machine, how to put the mask on, etc. I have placed an order in the chart. Please send the order to a local DME, talk to patient, send report to referring MD. Please also reinforce the need for compliance with treatment. We will need a FU in sleep clinic for 10 weeks post-PAP set up, please arrange that with me or one of our NPs. Thanks,   [name], MD, PhD [facility])

## 2022-09-06 NOTE — Progress Notes (Unsigned)
Assessment/Plan:   1.  ET/PD  -Continue carbidopa/levodopa 25/100 2 tablets at 9am/2 tablets at 1pm/1 tablet at 5pm.  Discussed appropriate timing of medication.  -Continue propranolol, 20 mg twice per day  -DaTscan was abnormal in October, 2021.  2.  Recurrent DVT/PE  -On Eliquis  3.  RBD  -Hold Tylenol PM.  Discussed this last visit as well.  -lexapro may contribute.  Discussed this with her last visit and this visit.  Told her that our options would be to decrease the Lexapro.  Told her I really do not want to add the clonazepam because of potential side effects, nor did she.  She also does not want to decrease the Lexapro.  She states that her symptoms of RBD are not that bad.  She already uses a railing for safety. 4.  Dysphagia  -MBE in October, 2022 with minimal oropharyngeal dysphagia.  Regular diet with thin liquid recommended. 5.  Severe sleep apnea, AHI 82.5  -On CPAP  Subjective:   [name] was seen today in follow up for ET/Parkinsons disease.  My previous records were reviewed prior to todays visit as well as outside records available to me.  She is accompanied by her daughter who supplements the history.  Last visit, the biggest issues we had discussed for timing of levodopa and taking it regularly, and lack of exercise.  She did do physical therapy after our last visit, but ***.  She has been to [facility] neurology since our last visit and she had a home sleep study that demonstrated very severe sleep apnea with an AHI of 82.5.  She was set up with AutoPap.  Current prescribed movement disorder medications: Carbidopa/levodopa 25/100, 2 tablets at 9am/2 tablets at 1pm/1 tablet at 5pm  Propranolol 20 mg twice per day   PREVIOUS MEDICATIONS: Topiramate, memory change  ALLERGIES:   Allergies  Allergen Reactions   Buprenorphine Hcl Nausea And Vomiting   Codeine Nausea And Vomiting    Other reaction(s): Other (See Comments) But tolerates Tramadol But  tolerates Tramadol Vomiting    Morphine And Related Nausea And Vomiting   Morphine     Other reaction(s): Other (See Comments) Vomiting   Procaine     Other reaction(s): Other (See Comments) Other reaction(s): Shakes Novacaine-Shakes    CURRENT MEDICATIONS:  Outpatient Encounter Medications as of 09/07/2022  Medication Sig   acetaminophen (TYLENOL) 500 MG tablet Take 500 mg by mouth every 6 (six) hours as needed (for pain.).   amLODipine (NORVASC) 2.5 MG tablet Take 2.5 mg by mouth daily.    amoxicillin-clavulanate (AUGMENTIN) 875-125 MG tablet Take 1 tablet by mouth every 12 (twelve) hours.   apixaban (ELIQUIS) 5 MG TABS tablet Take 1 tablet (5 mg total) by mouth 2 (two) times daily.   busPIRone (BUSPAR) 10 MG tablet Take 10 mg by mouth 2 (two) times daily.    carbidopa-levodopa (SINEMET IR) 25-100 MG tablet TAKE 2 TABLETS BY MOUTH AT 9 A.M., 2 TABLETS AT 1 P.M., AND 1 TABLET AT 5 P.M. AS DIRECTED   cholecalciferol (VITAMIN D) 25 MCG (1000 UNIT) tablet Take 1,000 Units by mouth daily.   donepezil (ARICEPT) 10 MG tablet Take 10 mg by mouth at bedtime.   escitalopram (LEXAPRO) 20 MG tablet Take 20 mg by mouth daily.    esomeprazole (NEXIUM) 40 MG capsule Take 40 mg by mouth 2 (two) times daily before a meal.    loratadine (CLARITIN) 10 MG tablet Take 10 mg by mouth at  bedtime.   mirabegron ER (MYRBETRIQ) 50 MG TB24 tablet Take 50 mg by mouth at bedtime.    olmesartan (BENICAR) 20 MG tablet Take 20 mg by mouth in the morning and at bedtime.    propranolol (INDERAL) 20 MG tablet TAKE ONE TABLET BY MOUTH TWICE A DAY (Patient taking differently: Take 20 mg by mouth 2 (two) times daily.)   TOVIAZ 8 MG TB24 tablet Take 8 mg by mouth at bedtime.    vitamin B-12 (CYANOCOBALAMIN) 1000 MCG tablet Take 1,000 mcg by mouth daily.   [DISCONTINUED] carvedilol (COREG) 6.25 MG tablet    No facility-administered encounter medications on file as of 09/07/2022.    Objective:   PHYSICAL EXAMINATION:     VITALS:   There were no vitals filed for this visit.     GEN:  The patient appears stated age and is in NAD. HEENT:  Normocephalic, atraumatic.  The mucous membranes are moist.   Neurological examination:  Orientation: The patient is alert and oriented x3. Cranial nerves: There is good facial symmetry without facial hypomimia. The speech is fluent and clear. Soft palate rises symmetrically and there is no tongue deviation. Hearing is intact to conversational tone. Sensation: Sensation is intact to light touch throughout Motor: Strength is at least antigravity x4.  Movement examination: Tone: There is nl tone in the UE/LE Abnormal movements: there is rest tremor, very mild, bilaterally, right greater than left Coordination:  There is min decremation with RAM's, on the right Gait and Station: The patient has mild difficulty arising out of a deep-seated chair without the use of the hands. The patients gait is wide-based and somewhat antalgic.  She is short stepped but does not shuffle.  This is true with the walker and without the walker.  This really has not changed much from last visit.  I have reviewed and interpreted the following labs independently    Chemistry      Component Value Date/Time   NA 131 (L) 06/19/2022 2038   K 3.8 06/19/2022 2038   CL 99 06/19/2022 2038   CO2 23 06/19/2022 2038   BUN 16 06/19/2022 2038   CREATININE 0.62 06/19/2022 2038      Component Value Date/Time   CALCIUM 8.5 (L) 06/19/2022 2038   ALKPHOS 59 06/19/2022 2038   AST 13 (L) 06/19/2022 2038   ALT 7 06/19/2022 2038   BILITOT 0.7 06/19/2022 2038       Lab Results  Component Value Date   WBC 9.4 06/19/2022   HGB 12.7 06/19/2022   HCT 38.6 06/19/2022   MCV 95.1 06/19/2022   PLT 178 06/19/2022    Lab Results  Component Value Date   TSH 1.749 05/18/2018     Total time spent on today's visit was *** minutes, including both face-to-face time and nonface-to-face time.  Time  included that spent on review of records (prior notes available to me/labs/imaging if pertinent), discussing treatment and goals, answering patient's questions and coordinating care.  Cc:  Ginger Organ., MD

## 2022-09-07 ENCOUNTER — Ambulatory Visit: Payer: Medicare HMO | Admitting: Neurology

## 2022-09-07 ENCOUNTER — Encounter: Payer: Self-pay | Admitting: Neurology

## 2022-09-07 VITALS — BP 123/73 | HR 74 | Ht 60.0 in | Wt 219.4 lb

## 2022-09-07 DIAGNOSIS — G20 Parkinson's disease: Secondary | ICD-10-CM

## 2022-09-07 MED ORDER — CARBIDOPA-LEVODOPA 25-100 MG PO TABS
ORAL_TABLET | ORAL | 1 refills | Status: DC
Start: 2022-09-07 — End: 2023-04-11

## 2022-09-07 NOTE — Patient Instructions (Signed)
Local and Online Resources for Power over Parkinson's Group September 2023  LOCAL [place] PARKINSON'S GROUPS  Power over Parkinson's Group:   Power Over Parkinson's Patient Education Group will be Wednesday, September 13th-*Hybrid meting*- in person at [facility] location and via [facility] at 2 pm.   Upcoming Power over [employer] Meetings:  2nd Wednesdays of the month at 2 pm:  September 13th, October 11th, November 8th Contact Amy Marriott at [email or web address] if interested in participating in this group Parkinson's Care Partners Group:    3rd Mondays, Contact Misty Paladino Atypical Parkinsonian Patient Group:   4th Wednesdays, [place] If you are interested in participating in these groups with Misty, please contact her directly for how to join those meetings.  Her contact information is [email or web address].    LOCAL EVENTS AND NEW OFFERINGS New PWR! Moves Dynegy Instructor-Led Classes offering at [employer]!  Wednesdays 1-2 pm.   Contact Vonna Kotyk at  [email or web address] or Caron Presume at [place], [email or web address] Dance for Parkinson 's classes will be on Tuesdays 9:30am-10:30am starting October 3-December 12 with a break the week of November 21 . Located in the December 04 which is in the first floor of the [employer]  (Moro for Parkinson's will be held on 2nd and 4th Mondays at 11:00am . First class will start  September 25th.  Located at the [place] ([place].) Through support from the [place] and Drumming for Parkinson's classes are free for both patients and caregivers.  Contact Misty Taylor-Paladino for more details about registering.  [place] [facility]:  [email or web address] PD Health at Home continues:  Mindfulness Mondays, Wellness  Wednesdays, Fitness Fridays  Upcoming Education:   Navigating Nutrition with PD.  Wednesday, Sept. 6th 1:00-2:00 pm Understanding Mind and Memory.  Wednesday, Sept. 20th 1:00-2:00 pm  Expert Briefing:    Parkinson's Disease and the Bladder.  Wednesday, Sept. 13th 1:00-2:00 pm Parkinson's and the Gut-Brain Connection.  Wednesday, Oct. 11th 1:00-2:00 pm Register for expert briefings (webinars) at 05-29-1978 Please check out their website to sign up for emails and see their full online offerings   [place]:  [email or web address]  Third Thursday Webinars:  On the third Thursday of every month at 12 p.m. ET, join our free live webinars to learn about various aspects of living with Parkinson's disease and our work to speed medical breakthroughs. Upcoming Webinar:  Stay tuned Check out additional information on their website to see their full online offerings  Friday:  [email or web address] Upcoming Webinar:   Stay tuned Webinar Series:  Living with Parkinson's Meetup.   Third Thursdays each month, 3 pm Care Partner Monthly Meetup.  With 01-27-1988 [place].  First Tuesday of each month, 2 pm Check out additional information to Live Well Today on their website  Parkinson and Movement Disorders (PMD) Alliance:  www.[email or web address] [employer] Online:  Online Education Events Sign up for emails, which are sent weekly to give you updates on programming and online offerings  Parkinson's Association of the [place]:  www.[email or web address] Information on online support groups, education events, and online exercises including Yoga, Parkinson's exercises and more-LOTS of information on links to PD resources and online events Virtual Support Group through Parkinson's Association of the [place]; next one is scheduled for Wednesday, October 4th at 2 pm. (No September meeting  due to the symposium.  These are typically scheduled for the 1st Wednesday  of the month at 2 pm).  Visit website for details. Register for "Caring for Parkinson's-Caring for You", 9th Annual Symposium.  In-person event in [place].  September 9th.  To register:  [email or web address]_[email or web address] MOVEMENT AND EXERCISE OPPORTUNITIES PWR! Moves Classes at [place].  Wednesdays 10 and 11 am.   Contact Amy Marriott, PT [email or web address] if interested. NEW PWR! Moves Class offerings at [employer].  Wednesdays 1-2 pm.  Contact Vonna Kotyk at  [email or web address] or Caron Presume at [place],  [email or web address] Parkinson's Wellness Recovery (PWR! Moves)  [email or web address] Info on the PWR! Virtual Experience:  You will have access to our expertise through self-assessment, guided plans that start with the PD-specific fundamentals, educational content, tips, Q&A with an expert, and a growing Art therapist of PD-specific pre-recorded and live exercise classes of varying types and intensity - both physical and cognitive! If that is not enough, we offer 1:1 wellness consultations (in-person or virtual) to personalize your PWR! Research scientist (medical).  [place] Fridays:  As part of the PD Health @ Home program, this free video series focuses each week on one aspect of fitness designed to support people living with Parkinson's.  These weekly videos highlight the [place] recent fitness guidelines for people with Parkinson's disease. [employer].si Dance for PD website is offering free, live-stream classes throughout the week, as well as links to [employer] of classes:  [email or web address] Virtual dance and Pilates for Parkinson's classes: Click on the Community Tab> Parkinson's Movement Initiative Tab.  To register for classes and for more  information, visit [email or web address] and click the "community" tab.  [employer] Parkinson's Cycling Classes  [employer]:  Thursdays @ Noon-Live classes at [employer] ([employer] at [email or web address] or [phone]) [employer]: Virtual Classes Mondays and Thursdays Jeanette Caprice classes Tuesday, Wednesday and Thursday (contact [place] at [email or web address]  or [phone]) [place] Varied levels of classes are offered Tuesdays and Thursdays at [facility].  Stretching with Verdis Frederickson weekly class is also offered for people with Parkinson's To observe a class or for more information, call [phone] or email Hezzie Bump at [email or web address] ADDITIONAL SUPPORT AND RESOURCES Well[employer]:Online Caregiver Education Opportunities:  www.well-[email or web address]/caregiver-education/[email or web address]-support-group.  You may also contact Vickki Muff at [email or web address] or [phone].    Coping with Difficult Caregiver Emotions.  Wednesday, September 20th, 10:30 am-12.  The [facility] Collective Navigating the Maze of Senior Care Options.  Thursday, September 28th, 4-5:15 pm.  The [facility]. Well-Spring Navigator:  [facility] program, a free service to help individuals and families through the journey of determining care for older adults.  The "Navigator" is a [employer], Education officer, museum, who will speak with a prospective client and/or loved ones to provide an assessment of the situation and a set of recommendations for a personalized care plan -- all free of charge, and whether Well[employer] offers the needed service or not. If the need is not a service we provide, we are well-connected with reputable programs in town that we can refer you to.  www.well-[email or web address] or to speak with the Navigator, call [phone].

## 2022-09-09 DIAGNOSIS — G4733 Obstructive sleep apnea (adult) (pediatric): Secondary | ICD-10-CM | POA: Diagnosis not present

## 2022-09-09 DIAGNOSIS — G4719 Other hypersomnia: Secondary | ICD-10-CM | POA: Diagnosis not present

## 2022-09-16 DIAGNOSIS — M6281 Muscle weakness (generalized): Secondary | ICD-10-CM | POA: Diagnosis not present

## 2022-09-16 DIAGNOSIS — R2681 Unsteadiness on feet: Secondary | ICD-10-CM | POA: Diagnosis not present

## 2022-09-16 DIAGNOSIS — G20B1 Parkinson's disease with dyskinesia, without mention of fluctuations: Secondary | ICD-10-CM | POA: Diagnosis not present

## 2022-09-16 DIAGNOSIS — R2689 Other abnormalities of gait and mobility: Secondary | ICD-10-CM | POA: Diagnosis not present

## 2022-09-21 DIAGNOSIS — G20B1 Parkinson's disease with dyskinesia, without mention of fluctuations: Secondary | ICD-10-CM | POA: Diagnosis not present

## 2022-09-21 DIAGNOSIS — R2689 Other abnormalities of gait and mobility: Secondary | ICD-10-CM | POA: Diagnosis not present

## 2022-09-21 DIAGNOSIS — M6281 Muscle weakness (generalized): Secondary | ICD-10-CM | POA: Diagnosis not present

## 2022-09-21 DIAGNOSIS — R2681 Unsteadiness on feet: Secondary | ICD-10-CM | POA: Diagnosis not present

## 2022-09-23 DIAGNOSIS — M6281 Muscle weakness (generalized): Secondary | ICD-10-CM | POA: Diagnosis not present

## 2022-09-23 DIAGNOSIS — G20B1 Parkinson's disease with dyskinesia, without mention of fluctuations: Secondary | ICD-10-CM | POA: Diagnosis not present

## 2022-09-23 DIAGNOSIS — R2689 Other abnormalities of gait and mobility: Secondary | ICD-10-CM | POA: Diagnosis not present

## 2022-09-23 DIAGNOSIS — R2681 Unsteadiness on feet: Secondary | ICD-10-CM | POA: Diagnosis not present

## 2022-09-28 DIAGNOSIS — R2689 Other abnormalities of gait and mobility: Secondary | ICD-10-CM | POA: Diagnosis not present

## 2022-09-28 DIAGNOSIS — R2681 Unsteadiness on feet: Secondary | ICD-10-CM | POA: Diagnosis not present

## 2022-09-28 DIAGNOSIS — M6281 Muscle weakness (generalized): Secondary | ICD-10-CM | POA: Diagnosis not present

## 2022-09-28 DIAGNOSIS — G20B1 Parkinson's disease with dyskinesia, without mention of fluctuations: Secondary | ICD-10-CM | POA: Diagnosis not present

## 2022-09-30 DIAGNOSIS — M6281 Muscle weakness (generalized): Secondary | ICD-10-CM | POA: Diagnosis not present

## 2022-09-30 DIAGNOSIS — R2689 Other abnormalities of gait and mobility: Secondary | ICD-10-CM | POA: Diagnosis not present

## 2022-09-30 DIAGNOSIS — G20B1 Parkinson's disease with dyskinesia, without mention of fluctuations: Secondary | ICD-10-CM | POA: Diagnosis not present

## 2022-09-30 DIAGNOSIS — R2681 Unsteadiness on feet: Secondary | ICD-10-CM | POA: Diagnosis not present

## 2022-10-05 DIAGNOSIS — G20B1 Parkinson's disease with dyskinesia, without mention of fluctuations: Secondary | ICD-10-CM | POA: Diagnosis not present

## 2022-10-05 DIAGNOSIS — M6281 Muscle weakness (generalized): Secondary | ICD-10-CM | POA: Diagnosis not present

## 2022-10-05 DIAGNOSIS — R2681 Unsteadiness on feet: Secondary | ICD-10-CM | POA: Diagnosis not present

## 2022-10-05 DIAGNOSIS — R2689 Other abnormalities of gait and mobility: Secondary | ICD-10-CM | POA: Diagnosis not present

## 2022-10-07 DIAGNOSIS — R221 Localized swelling, mass and lump, neck: Secondary | ICD-10-CM | POA: Diagnosis not present

## 2022-10-07 DIAGNOSIS — G4733 Obstructive sleep apnea (adult) (pediatric): Secondary | ICD-10-CM | POA: Diagnosis not present

## 2022-10-07 DIAGNOSIS — R0600 Dyspnea, unspecified: Secondary | ICD-10-CM | POA: Diagnosis not present

## 2022-10-07 DIAGNOSIS — G629 Polyneuropathy, unspecified: Secondary | ICD-10-CM | POA: Diagnosis not present

## 2022-10-07 DIAGNOSIS — R2681 Unsteadiness on feet: Secondary | ICD-10-CM | POA: Diagnosis not present

## 2022-10-07 DIAGNOSIS — N3281 Overactive bladder: Secondary | ICD-10-CM | POA: Diagnosis not present

## 2022-10-07 DIAGNOSIS — G20A1 Parkinson's disease without dyskinesia, without mention of fluctuations: Secondary | ICD-10-CM | POA: Diagnosis not present

## 2022-10-07 DIAGNOSIS — M4800 Spinal stenosis, site unspecified: Secondary | ICD-10-CM | POA: Diagnosis not present

## 2022-10-07 DIAGNOSIS — Z86711 Personal history of pulmonary embolism: Secondary | ICD-10-CM | POA: Diagnosis not present

## 2022-10-07 DIAGNOSIS — I872 Venous insufficiency (chronic) (peripheral): Secondary | ICD-10-CM | POA: Diagnosis not present

## 2022-10-08 ENCOUNTER — Other Ambulatory Visit: Payer: Self-pay | Admitting: Family Medicine

## 2022-10-08 DIAGNOSIS — R221 Localized swelling, mass and lump, neck: Secondary | ICD-10-CM

## 2022-10-09 DIAGNOSIS — G4733 Obstructive sleep apnea (adult) (pediatric): Secondary | ICD-10-CM | POA: Diagnosis not present

## 2022-10-09 DIAGNOSIS — G4719 Other hypersomnia: Secondary | ICD-10-CM | POA: Diagnosis not present

## 2022-10-12 DIAGNOSIS — R2689 Other abnormalities of gait and mobility: Secondary | ICD-10-CM | POA: Diagnosis not present

## 2022-10-12 DIAGNOSIS — M6281 Muscle weakness (generalized): Secondary | ICD-10-CM | POA: Diagnosis not present

## 2022-10-12 DIAGNOSIS — R2681 Unsteadiness on feet: Secondary | ICD-10-CM | POA: Diagnosis not present

## 2022-10-12 DIAGNOSIS — G20B1 Parkinson's disease with dyskinesia, without mention of fluctuations: Secondary | ICD-10-CM | POA: Diagnosis not present

## 2022-10-14 ENCOUNTER — Ambulatory Visit
Admission: RE | Admit: 2022-10-14 | Discharge: 2022-10-14 | Disposition: A | Discharge status: Home / Self Care | Payer: Medicare HMO | Source: Ambulatory Visit | Attending: Family Medicine | Admitting: Family Medicine

## 2022-10-14 DIAGNOSIS — R221 Localized swelling, mass and lump, neck: Secondary | ICD-10-CM | POA: Diagnosis not present

## 2022-10-15 DIAGNOSIS — R2689 Other abnormalities of gait and mobility: Secondary | ICD-10-CM | POA: Diagnosis not present

## 2022-10-15 DIAGNOSIS — G20B1 Parkinson's disease with dyskinesia, without mention of fluctuations: Secondary | ICD-10-CM | POA: Diagnosis not present

## 2022-10-15 DIAGNOSIS — R2681 Unsteadiness on feet: Secondary | ICD-10-CM | POA: Diagnosis not present

## 2022-10-15 DIAGNOSIS — M6281 Muscle weakness (generalized): Secondary | ICD-10-CM | POA: Diagnosis not present

## 2022-10-19 DIAGNOSIS — R2681 Unsteadiness on feet: Secondary | ICD-10-CM | POA: Diagnosis not present

## 2022-10-19 DIAGNOSIS — M6281 Muscle weakness (generalized): Secondary | ICD-10-CM | POA: Diagnosis not present

## 2022-10-19 DIAGNOSIS — G20B1 Parkinson's disease with dyskinesia, without mention of fluctuations: Secondary | ICD-10-CM | POA: Diagnosis not present

## 2022-10-19 DIAGNOSIS — R2689 Other abnormalities of gait and mobility: Secondary | ICD-10-CM | POA: Diagnosis not present

## 2022-10-21 DIAGNOSIS — R2689 Other abnormalities of gait and mobility: Secondary | ICD-10-CM | POA: Diagnosis not present

## 2022-10-21 DIAGNOSIS — G20B1 Parkinson's disease with dyskinesia, without mention of fluctuations: Secondary | ICD-10-CM | POA: Diagnosis not present

## 2022-10-21 DIAGNOSIS — M6281 Muscle weakness (generalized): Secondary | ICD-10-CM | POA: Diagnosis not present

## 2022-10-21 DIAGNOSIS — R2681 Unsteadiness on feet: Secondary | ICD-10-CM | POA: Diagnosis not present

## 2022-10-26 DIAGNOSIS — M6281 Muscle weakness (generalized): Secondary | ICD-10-CM | POA: Diagnosis not present

## 2022-10-26 DIAGNOSIS — R2689 Other abnormalities of gait and mobility: Secondary | ICD-10-CM | POA: Diagnosis not present

## 2022-10-26 DIAGNOSIS — G20B1 Parkinson's disease with dyskinesia, without mention of fluctuations: Secondary | ICD-10-CM | POA: Diagnosis not present

## 2022-10-26 DIAGNOSIS — R2681 Unsteadiness on feet: Secondary | ICD-10-CM | POA: Diagnosis not present

## 2022-11-02 DIAGNOSIS — M6281 Muscle weakness (generalized): Secondary | ICD-10-CM | POA: Diagnosis not present

## 2022-11-02 DIAGNOSIS — R2689 Other abnormalities of gait and mobility: Secondary | ICD-10-CM | POA: Diagnosis not present

## 2022-11-02 DIAGNOSIS — R2681 Unsteadiness on feet: Secondary | ICD-10-CM | POA: Diagnosis not present

## 2022-11-02 DIAGNOSIS — G20B1 Parkinson's disease with dyskinesia, without mention of fluctuations: Secondary | ICD-10-CM | POA: Diagnosis not present

## 2022-11-08 DIAGNOSIS — R2681 Unsteadiness on feet: Secondary | ICD-10-CM | POA: Diagnosis not present

## 2022-11-08 DIAGNOSIS — G20B1 Parkinson's disease with dyskinesia, without mention of fluctuations: Secondary | ICD-10-CM | POA: Diagnosis not present

## 2022-11-08 DIAGNOSIS — M6281 Muscle weakness (generalized): Secondary | ICD-10-CM | POA: Diagnosis not present

## 2022-11-08 DIAGNOSIS — R2689 Other abnormalities of gait and mobility: Secondary | ICD-10-CM | POA: Diagnosis not present

## 2022-11-09 ENCOUNTER — Encounter: Payer: Self-pay | Admitting: Neurology

## 2022-11-09 ENCOUNTER — Ambulatory Visit: Payer: Medicare HMO | Admitting: Neurology

## 2022-11-09 VITALS — BP 134/73 | HR 70 | Ht 59.0 in | Wt 224.2 lb

## 2022-11-09 DIAGNOSIS — G4719 Other hypersomnia: Secondary | ICD-10-CM | POA: Diagnosis not present

## 2022-11-09 DIAGNOSIS — G4733 Obstructive sleep apnea (adult) (pediatric): Secondary | ICD-10-CM

## 2022-11-09 NOTE — Patient Instructions (Addendum)
Please continue using your autoPAP regularly. While your insurance requires that you use PAP at least 4 hours each night on 70% of the nights, I recommend, that you not skip any nights and use it throughout the night if you can. Getting used to PAP and staying with the treatment long term does take time and patience and discipline. Untreated obstructive sleep apnea when it is moderate to severe can have an adverse impact on cardiovascular health and raise her risk for heart disease, arrhythmias, hypertension, congestive heart failure, stroke and diabetes. Untreated obstructive sleep apnea causes sleep disruption, nonrestorative sleep, and sleep deprivation. This can have an impact on your day to day functioning and cause daytime sleepiness and impairment of cognitive function, memory loss, mood disturbance, and problems focussing. Using PAP regularly can improve these symptoms. You can follow-up in 1 year for sleep apnea checkup, you can see one of our nurse practitioners in a virtual visit through [place].

## 2022-11-09 NOTE — Progress Notes (Signed)
Subjective:    Patient ID: [name] is a 81 y.o. female.  HPI     Interim history:   Ms. [name] is an 81 year old right-handed woman with an underlying medical history of arthritis, degenerative disc disease, diverticulosis, history of DVT, reflux disease, hypertension, osteoporosis, tremor and parkinsonism (followed by Dr. Carles Collet at [facility] and severe obesity with a BMI over 26, who presents for follow-up consultation of her obstructive sleep apnea after interim testing and starting treatment with a new AutoPap machine.  The patient is accompanied by her daughter again today.  I saw her on 07/29/2022 for reevaluation of her sleep apnea.  She had not used her PAP machine in about 3 years at the time.  We proceeded with a home sleep test.  She had a home sleep test on 08/30/2022 which indicated severe obstructive sleep apnea with an AHI of 82.5/h, O2 nadir 84%, snoring was in the mild to moderate range, at times louder.  Time below or at 88% saturation was over 30 minutes for the night.  She was advised to proceed with home AutoPap therapy.  Her set up date was 09/09/2022.  She has a ResMed air sense 10 AutoSet machine.  Her DME company is [employer].  Today, 11/09/2022 (all dictated new, as well as above notes, some dictation done in note pad or Word, outside of chart, may appear as copied):   I reviewed her AutoPap compliance data from 10/09/2022 through 11/07/2022, which is a total of 30 days, during which time she used her machine every night with percent use days greater than 4 hours at 100%, indicating superb compliance, average usage of 8 hours and 39 minutes, residual AHI at goal at 0.5/h, average pressure for the 95th percentile at 10.1 cm with a range of 5 to 11 cm with EPR of 3.  Leak acceptable with the 95th percentile at 10 L/min.  She reports having adjusted fairly well to AutoPap therapy.  She has benefited from treatment, feels less sleepy during the day but still has some residual  tiredness, Epworth sleepiness score is 9 out of 24 today.  She is motivated to continue with treatment, uses a ResMed N20 nasal mask.  She hydrates well with water.  The patient's allergies, current medications, family history, past medical history, past social history, past surgical history and problem list were reviewed and updated as appropriate.   Previously (copied from previous notes for reference):   07/29/22: (She) was previously diagnosed with obstructive sleep apnea.  She was on CPAP therapy in the past.  I had evaluated her in 2015 for sleep apnea.  She had a split-night sleep study on 06/05/2014.  Her baseline AHI was 60.6/h at the time.  Oxygen nadir was 84%.  She was on CPAP therapy at a pressure of 6 cm. Set up date was 08/20/14.  She has not been seen in this clinic since 2016.  I reviewed your office note from 06/21/2022.  She reported increase in daytime somnolence and trouble sleeping at night. Her Epworth sleepiness score is 13 out of 24, fatigue severity score is 57 out of 63.   She has not used her CPAP in about 3 years.  She would be willing to get restarted on CPAP therapy.   There was limited compliance data in the month of March 2020, total of 16 days, pressure of 6 cm, average AHI 6.7/h, average usage 8 hours and 20 minutes.  She reports taking naps during the day.  She  reports having trouble with her CPAP in the past and eventually gave up using it.  She was having trouble getting new supplies as well as understand.  Bedtime is currently around 11 and rise time around 8.  She lives in independent living at [place].  She has CNAs come in for a few hours every day.  She drinks caffeine in the form of coffee, 1 cup/day and tea about 2 to 3 cups/day on average.  She does not drink any daily soda.  She does not drink any alcohol currently.  She quit smoking over 40 years ago.  Her son has sleep apnea and has a CPAP machine.  She lost her husband about 2 years ago.  She has woken up  confused at times.  She denies recurrent morning or nocturnal headaches, no night to night nocturia but does have hyperactive bladder.  Her weight has been fluctuating.  She does admit to eating ice cream almost every day.  She uses a rolling walker or an Transport planner.     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 Carles Collet), who presents for follow up consultation of her OSA, treated with CPAP. She is unaccompanied today.      I reviewed her CPAP compliance data from 09/01/2015 through 09/30/2015 which is a total of 30 days during which time she used her machine every night with percent used days greater than 4 hours at 100%, indicating superb compliance with an average usage of 8 hours and 44 minutes, residual AHI low at 1.5 per hour, leaked low at 5.6 L/m for the 95th percentile pressure of 6 cm with EPR.   She reports that she has no problems with her CPAP machine. She is compliant with treatment. Her nocturia has become a little worse. She has had more daytime somnolence in the past couple of months or maybe longer. She is worried about the CPAP not working properly. She has had some more stress, particularly with her husband's health issues. In August 2016 she saw Dr. Carles Collet for tremor follow-up and I reviewed the note. She was started on propranolol LA 80 mg once daily. The patient feels that the propranolol addition has helped her tremors. She saw her primary care physician on 09/30/2015 and talked about the increase in daytime somnolence. She was taken off of the Wellbutrin because it may have been causing her night sweats. She had some blood work at the time including TSH. Results are  pending.   I saw her on 04/14/2015, at which time she reported doing well on CPAP. She had improvement of her nocturia. She felt that her sleep was better consolidated. She was wearing about her husband health in his cognitive decline. She reported having 2 biological children and her husband had 8 children from his previous marriage. They were married for 25 years. Her tremor was fluctuating and generally speaking responsive to Mysoline but she was not able to increase the dose. She was getting too sleepy from the Mysoline. She was taking 2 pills at night only. She was reporting stress and seeing a therapist was helpful. She was trying to lose weight.    I saw her on 10/10/2014, at which time she reported doing fairly well, with some residual  daytime tiredness. She reported some stress. She was supposed to meet with a therapist soon. She has been on Cymbalta. She reported improved nocturia, and her sleep was more consolidated. She did have an increase in her Mysoline dose right around the same time she started CPAP therapy. She was fully compliant with treatment and I encouraged her to continue with treatment. We talked about the sleep test results at the time.   I reviewed her CPAP compliance data from 03/12/2015 through 04/10/2015 which is a total of 30 days during which time she used her machine every night with percent used days greater than 4 hours at 100%, indicating superb compliance, with an average usage of 8 hours and 12 minutes, pressure of 6 cm, residual AHI low at 1.6 per hour and leak low, with the 95th percentile of leak at 4 L/m     I first met her on 04/29/14, at the request of her PCP, at which time she reported excessive daytime somnolence, nocturia, and snoring, (per husband). I suggested she return for sleep study. She had a split-night sleep study on 06/05/2014 and I went over her test results with her in detail today. Her baseline sleep efficiency was 68.6% with a latency to sleep of  26 minutes and wake after sleep onset of 16.5 minutes with mild sleep fragmentation noted. She had an elevated arousal index. She had an elevated percentage of light stage sleep, absence of deep sleep, and absence of REM sleep. She had no significant EEG changes and had rare PVCs and PVCs. She had a total AHI of 60.6 per hour. Baseline oxygen saturation was 90% with a nadir of 84%. She was titrated on CPAP. Sleep efficiency was improved during the treatment portion of the study. Arousal index was markedly improved. She achieved deep sleep and REM sleep during the treatment portion of the study. Average oxygen saturation was 91% with a nadir of 84%. Time below 88% saturation during the treatment portion of the study was 3 minutes and 35 seconds. She was titrated from 4-6 cm with a reduction of her AHI to 0.9 events at the final pressure. Supine REM sleep was achieved. Based on her sleep study results I ordered CPAP treatment for her.   I reviewed her compliance data from 08/20/2014 through 09/18/2014 which is a total of 30 days during which time she used her CPAP every night with percent used days greater than 4 hours at 100%, indicating superb compliance, average usage of 8 hours and 10 minutes, pressure at 6 cm with EPR of 3, residual AHI low at 1.9 per hour and leak was low at 4.3 L/m for the 95th percentile.   I reviewed her compliance data from 09/10/2014 through 10/09/2014 which is a total of 30 days during which time she was 100% compliant. Average usage of 8 hours and 17 minutes, residual AHI at 1.8 per hour, leak low. Pressure at 6 cm with EPR of 3.    She has been on Effexor XR 225 mg daily and tapered off of it over the course of a week and was started on Cymbalta 30 mg. She is retired as an Futures trader, but still works some. She has had some additional stressors. She endorses EDS and her ESS is 10/24. She has not fallen asleep driving. Her husband is 83 years old, and recently had eye  surgery. She denies gasping for air and is not sure that she has witnessed apneas. She does not have morning headaches, but  has had some headaches since she has been on Mobic. She has nocturia x 2, which is better since the ABx. She drinks caffeine, but has cut back on her sodas to none after 2 PM. She likes Coke zero. She does not take a scheduled nap. She denies RLS symptoms, and is not sure if she kicks in her sleep. She goes to bed by 10 PM and likes to read on her Melanee Spry, and she is asleep by 11 PM. She has taken the occasional tylenol PM.   There is no report of nighttime reflux, now that she is on Zegerid. There Is family history of RLS in her mother and suspected OSA in her father, who also had a tremor.   She is not a very restless sleeper. She likes to sleep on her right side, but can sleep on her stomach and her back.   She denies cataplexy, sleep paralysis, hypnagogic or hypnopompic hallucinations, or sleep attacks. She does not report any vivid dreams, nightmares, dream enactments, or parasomnias, such as sleep talking or sleep walking. The patient has not had a sleep study or a home sleep test. Her bedroom is usually dark and cool. There is a TV in the bedroom and usually it is not on at night.      Her Past Medical History Is Significant For: Past Medical History:  Diagnosis Date   Arthritis    arthritis in joints and back   Benign familial tremor    Clostridium difficile infection    DDD (degenerative disc disease), lumbar    Diverticulosis    DVT (deep venous thrombosis) (HCC)    left leg   Gallstones    GERD (gastroesophageal reflux disease)    Hypertension    Osteoporosis    Wears glasses     Her Past Surgical History Is Significant For: Past Surgical History:  Procedure Laterality Date   ABDOMINAL HYSTERECTOMY  1983   arterial vascular fistula  2004   coiled at [place]  right   CHOLECYSTECTOMY N/A 03/29/2013    Procedure: LAPAROSCOPIC CHOLECYSTECTOMY WITH INTRAOPERATIVE CHOLANGIOGRAM;  Surgeon: Haywood Lasso, MD;  Location: [facility];  Service: General;  Laterality: N/A;   EUS N/A 03/09/2013   Procedure: UPPER ENDOSCOPIC ULTRASOUND (EUS) LINEAR;  Surgeon: Beryle Beams, MD;  Location: [facility];  Service: Endoscopy;  Laterality: N/A;   LUMBAR LAMINECTOMY/DECOMPRESSION MICRODISCECTOMY  11/30/2012   Procedure: LUMBAR LAMINECTOMY/DECOMPRESSION MICRODISCECTOMY 2 LEVELS;  Surgeon: Magnus Sinning, MD;  Location: [facility];  Service: Orthopedics;  Laterality: N/A;  DECOMPRESSION LAMINECTOMY L4-L5, L5-S1 CENTRAL    ROTATOR CUFF REPAIR Bilateral 2004   two on right, one on left   TOTAL KNEE ARTHROPLASTY Left 06/06/2020   Procedure: LEFT TOTAL KNEE ARTHROPLASTY;  Surgeon: [name], MD;  Location: [facility];  Service: Orthopedics;  Laterality: Left;   TRIGGER FINGER RELEASE Right     hand    Her Family History Is Significant For: Family History  Problem Relation Age of Onset   Other Mother        sarcoma   Other Father        broken hip   Dementia Father    Pneumonia Father    Sleep apnea Neg Hx     Her Social History Is Significant For: Social History   Socioeconomic History   Marital status: Married    Spouse name: Richard   Number of children:  2   Years of education: 31   Highest education level: Not on file  Occupational History   Occupation: Futures trader    Comment: partially retired  Tobacco Use   Smoking status: Former    Packs/day: 0.50    Types: Cigarettes    Quit date: 07/11/1970    Years since quitting: 52.3   Smokeless tobacco: Never  Vaping Use   Vaping Use: Never used  Substance and Sexual Activity   Alcohol use: Yes    Alcohol/week: 0.0 standard drinks of alcohol    Comment: wine occasionally   Drug use: No   Sexual activity: Not Currently  Other Topics Concern   Not on file  Social History Narrative   Patient is right handed,  resides Alone.   1 cup of coffee a day    Social Determinants of Health   Financial Resource Strain: Not on file  Food Insecurity: Not on file  Transportation Needs: Not on file  Physical Activity: Not on file  Stress: Not on file  Social Connections: Not on file    Her Allergies Are:  Allergies  Allergen Reactions   Buprenorphine Hcl Nausea And Vomiting   Codeine Nausea And Vomiting    Other reaction(s): Other (See Comments) But tolerates Tramadol But tolerates Tramadol Vomiting    Morphine And Related Nausea And Vomiting   Morphine     Other reaction(s): Other (See Comments) Vomiting   Procaine     Other reaction(s): Other (See Comments) Other reaction(s): Shakes Novacaine-Shakes  :   Her Current Medications Are:  Outpatient Encounter Medications as of 11/09/2022  Medication Sig   acetaminophen (TYLENOL) 650 MG CR tablet Take 650 mg by mouth as needed for pain.   amLODipine (NORVASC) 2.5 MG tablet Take 2.5 mg by mouth daily.   apixaban (ELIQUIS) 5 MG TABS tablet Take 1 tablet (5 mg total) by mouth 2 (two) times daily.   busPIRone (BUSPAR) 10 MG tablet Take 10 mg by mouth 2 (two) times daily.    carbidopa-levodopa (SINEMET IR) 25-100 MG tablet 2 at 9am, 2 at 1 pm, 1 at 5pm   cholecalciferol (VITAMIN D) 25 MCG (1000 UNIT) tablet Take 1,000 Units by mouth daily.   donepezil (ARICEPT) 10 MG tablet Take 10 mg by mouth at bedtime.   escitalopram (LEXAPRO) 20 MG tablet Take 20 mg by mouth daily.    esomeprazole (NEXIUM) 40 MG capsule Take 40 mg by mouth 2 (two) times daily before a meal.    furosemide (LASIX) 40 MG tablet Take 40 mg by mouth daily.   loratadine (CLARITIN) 10 MG tablet Take 10 mg by mouth at bedtime.   METHOCARBAMOL PO Take by mouth as needed (Back Pain).   mirabegron ER (MYRBETRIQ) 50 MG TB24 tablet Take 50 mg by mouth at bedtime.    olmesartan (BENICAR) 20 MG tablet Take 20 mg by mouth in the morning and at bedtime.    propranolol (INDERAL) 20 MG tablet  TAKE ONE TABLET BY MOUTH TWICE A DAY (Patient taking differently: Take 20 mg by mouth 2 (two) times daily.)   TOVIAZ 8 MG TB24 tablet Take 8 mg by mouth at bedtime.    vitamin B-12 (CYANOCOBALAMIN) 1000 MCG tablet Take 1,000 mcg by mouth daily.   amoxicillin-clavulanate (AUGMENTIN) 875-125 MG tablet Take 1 tablet by mouth every 12 (twelve) hours. (Patient not taking: Reported on 11/09/2022)   [DISCONTINUED] acetaminophen (TYLENOL) 500 MG tablet Take 500 mg by mouth every 6 (six) hours as needed (  for pain.). (Patient not taking: Reported on 11/09/2022)   [DISCONTINUED] carvedilol (COREG) 6.25 MG tablet    No facility-administered encounter medications on file as of 11/09/2022.  :  Review of Systems:  Out of a complete 14 point review of systems, all are reviewed and negative with the exception of these symptoms as listed below:  Review of Systems  Neurological:        Pt is here for Follow Up on CPAP Machine. Pt states that she doesn't have any headaches.   ESS:9    Objective:  Neurological Exam  Physical Exam Physical Examination:   Vitals:   11/09/22 1328  BP: 134/73  Pulse: 70    General Examination: The patient is a very pleasant 81 y.o. female in no acute distress. She appears well-developed and well-nourished and well groomed.   HEENT: Normocephalic, atraumatic, pupils are equal, round and reactive to light, extraocular tracking is fairly well-preserved, hearing grossly intact, speech volume is softer, intermittent trembling noted in her voice.  Airway examination reveals mild to moderate mouth dryness, moderate airway crowding.  Tongue protrudes centrally and palate elevates symmetrically.  No carotid bruits.      Chest: Clear to auscultation without wheezing, rhonchi or crackles noted.   Heart: S1+S2+0, regular and normal without murmurs, rubs or gallops noted.    Abdomen: Soft, non-tender and non-distended.   Extremities: There is lower extremity swelling.   Skin:  Warm and dry without trophic changes noted. There are no varicose veins.   Musculoskeletal: exam reveals no obvious joint deformities.    Neurologically:  Mental status: The patient is awake, alert and oriented in all 4 spheres. Her immediate and remote memory, attention, language skills and fund of knowledge are appropriate. There is no evidence of aphasia, agnosia, apraxia or anomia. Speech is clear with normal prosody and enunciation. Thought process is linear. Mood is normal and affect is normal.  Cranial nerves II - XII are as described above under HEENT exam.  Motor exam: Normal bulk, and good global strength noted. There is a mild intermittent resting tremor in the RUE. Fine motor skills and coordination: mildly impaired.  Cerebellar testing: No dysmetria or intention tremor.  Sensory exam: intact to light touch.  Gait, station and balance: She uses a 4 wheeled walker with seat.             Assessment and Plan:  In summary, YAZMINA PAREJA is a very pleasant 81 year old female with an underlying medical history of arthritis, degenerative disc disease, diverticulosis, history of DVT, reflux disease, hypertension, osteoporosis, tremor and parkinsonism (followed by Dr. Carles Collet at [facility]), and severe obesity with a BMI over 28, who presents for follow-up consultation of her obstructive sleep apnea after interim testing and starting treatment with a new AutoPap machine.  She had a home sleep test on 08/30/2022 which indicated severe obstructive sleep apnea with an AHI of 82.5/h, O2 nadir 84%.  She established treatment with a new AutoPap machine on 09/09/2022.  She has a ResMed air sense 10 AutoSet machine.  Her DME company is [employer].  She is compliant with treatment, her apnea scores are very good, leak acceptable from her nasal mask.  She has benefited from treatment and is motivated to continue with it.  She is highly commended for treatment adherence and at this juncture advised to follow-up routinely  in sleep clinic to see one of our nurse practitioners in 1 year, we can offer a MyChart video visit for this purpose.  We talked about her home sleep test results and reviewed her compliance data in detail today.  I answered all her questions today and the patient and her daughter were in agreement. I spent 30 minutes in total face-to-face time and in reviewing records during pre-charting, more than 50% of which was spent in counseling and coordination of care, reviewing test results, reviewing medications and treatment regimen and/or in discussing or reviewing the diagnosis of OSA, the prognosis and treatment options. Pertinent laboratory and imaging test results that were available during this visit with the patient were reviewed by me and considered in my medical decision making (see chart for details).

## 2022-11-10 ENCOUNTER — Ambulatory Visit: Payer: Medicare HMO | Admitting: Orthopaedic Surgery

## 2022-11-11 DIAGNOSIS — R2689 Other abnormalities of gait and mobility: Secondary | ICD-10-CM | POA: Diagnosis not present

## 2022-11-11 DIAGNOSIS — R2681 Unsteadiness on feet: Secondary | ICD-10-CM | POA: Diagnosis not present

## 2022-11-11 DIAGNOSIS — G20B1 Parkinson's disease with dyskinesia, without mention of fluctuations: Secondary | ICD-10-CM | POA: Diagnosis not present

## 2022-11-11 DIAGNOSIS — M6281 Muscle weakness (generalized): Secondary | ICD-10-CM | POA: Diagnosis not present

## 2022-11-12 ENCOUNTER — Other Ambulatory Visit: Payer: Self-pay | Admitting: Internal Medicine

## 2022-11-12 ENCOUNTER — Other Ambulatory Visit: Payer: Self-pay | Admitting: Neurology

## 2022-11-12 DIAGNOSIS — G20A1 Parkinson's disease without dyskinesia, without mention of fluctuations: Secondary | ICD-10-CM

## 2022-11-12 DIAGNOSIS — Z1231 Encounter for screening mammogram for malignant neoplasm of breast: Secondary | ICD-10-CM

## 2022-11-16 DIAGNOSIS — M6281 Muscle weakness (generalized): Secondary | ICD-10-CM | POA: Diagnosis not present

## 2022-11-16 DIAGNOSIS — R2681 Unsteadiness on feet: Secondary | ICD-10-CM | POA: Diagnosis not present

## 2022-11-16 DIAGNOSIS — G20B1 Parkinson's disease with dyskinesia, without mention of fluctuations: Secondary | ICD-10-CM | POA: Diagnosis not present

## 2022-11-16 DIAGNOSIS — R2689 Other abnormalities of gait and mobility: Secondary | ICD-10-CM | POA: Diagnosis not present

## 2022-11-18 DIAGNOSIS — R2681 Unsteadiness on feet: Secondary | ICD-10-CM | POA: Diagnosis not present

## 2022-11-18 DIAGNOSIS — R2689 Other abnormalities of gait and mobility: Secondary | ICD-10-CM | POA: Diagnosis not present

## 2022-11-18 DIAGNOSIS — M6281 Muscle weakness (generalized): Secondary | ICD-10-CM | POA: Diagnosis not present

## 2022-11-18 DIAGNOSIS — G20B1 Parkinson's disease with dyskinesia, without mention of fluctuations: Secondary | ICD-10-CM | POA: Diagnosis not present

## 2022-11-22 NOTE — Progress Notes (Signed)
Notes faxed to [facility] 11-09-2022 ofv note.

## 2022-11-23 DIAGNOSIS — N3946 Mixed incontinence: Secondary | ICD-10-CM | POA: Diagnosis not present

## 2022-11-26 DIAGNOSIS — R1010 Upper abdominal pain, unspecified: Secondary | ICD-10-CM | POA: Diagnosis not present

## 2022-11-30 DIAGNOSIS — M6281 Muscle weakness (generalized): Secondary | ICD-10-CM | POA: Diagnosis not present

## 2022-11-30 DIAGNOSIS — G20B1 Parkinson's disease with dyskinesia, without mention of fluctuations: Secondary | ICD-10-CM | POA: Diagnosis not present

## 2022-11-30 DIAGNOSIS — R2689 Other abnormalities of gait and mobility: Secondary | ICD-10-CM | POA: Diagnosis not present

## 2022-11-30 DIAGNOSIS — R2681 Unsteadiness on feet: Secondary | ICD-10-CM | POA: Diagnosis not present

## 2022-12-07 DIAGNOSIS — R2681 Unsteadiness on feet: Secondary | ICD-10-CM | POA: Diagnosis not present

## 2022-12-07 DIAGNOSIS — G20B1 Parkinson's disease with dyskinesia, without mention of fluctuations: Secondary | ICD-10-CM | POA: Diagnosis not present

## 2022-12-07 DIAGNOSIS — R2689 Other abnormalities of gait and mobility: Secondary | ICD-10-CM | POA: Diagnosis not present

## 2022-12-07 DIAGNOSIS — M6281 Muscle weakness (generalized): Secondary | ICD-10-CM | POA: Diagnosis not present

## 2022-12-08 DIAGNOSIS — M6281 Muscle weakness (generalized): Secondary | ICD-10-CM | POA: Diagnosis not present

## 2022-12-08 DIAGNOSIS — G20B1 Parkinson's disease with dyskinesia, without mention of fluctuations: Secondary | ICD-10-CM | POA: Diagnosis not present

## 2022-12-08 DIAGNOSIS — R2681 Unsteadiness on feet: Secondary | ICD-10-CM | POA: Diagnosis not present

## 2022-12-08 DIAGNOSIS — R2689 Other abnormalities of gait and mobility: Secondary | ICD-10-CM | POA: Diagnosis not present

## 2022-12-09 DIAGNOSIS — G4719 Other hypersomnia: Secondary | ICD-10-CM | POA: Diagnosis not present

## 2022-12-09 DIAGNOSIS — G4733 Obstructive sleep apnea (adult) (pediatric): Secondary | ICD-10-CM | POA: Diagnosis not present

## 2022-12-10 DIAGNOSIS — G4719 Other hypersomnia: Secondary | ICD-10-CM | POA: Diagnosis not present

## 2022-12-10 DIAGNOSIS — G4733 Obstructive sleep apnea (adult) (pediatric): Secondary | ICD-10-CM | POA: Diagnosis not present

## 2022-12-10 DIAGNOSIS — G20B1 Parkinson's disease with dyskinesia, without mention of fluctuations: Secondary | ICD-10-CM | POA: Diagnosis not present

## 2022-12-10 DIAGNOSIS — R2689 Other abnormalities of gait and mobility: Secondary | ICD-10-CM | POA: Diagnosis not present

## 2022-12-10 DIAGNOSIS — M6281 Muscle weakness (generalized): Secondary | ICD-10-CM | POA: Diagnosis not present

## 2022-12-10 DIAGNOSIS — R2681 Unsteadiness on feet: Secondary | ICD-10-CM | POA: Diagnosis not present

## 2022-12-14 DIAGNOSIS — R2689 Other abnormalities of gait and mobility: Secondary | ICD-10-CM | POA: Diagnosis not present

## 2022-12-14 DIAGNOSIS — M6281 Muscle weakness (generalized): Secondary | ICD-10-CM | POA: Diagnosis not present

## 2022-12-14 DIAGNOSIS — G20B1 Parkinson's disease with dyskinesia, without mention of fluctuations: Secondary | ICD-10-CM | POA: Diagnosis not present

## 2022-12-14 DIAGNOSIS — R2681 Unsteadiness on feet: Secondary | ICD-10-CM | POA: Diagnosis not present

## 2022-12-15 DIAGNOSIS — N390 Urinary tract infection, site not specified: Secondary | ICD-10-CM | POA: Diagnosis not present

## 2022-12-15 DIAGNOSIS — N3946 Mixed incontinence: Secondary | ICD-10-CM | POA: Diagnosis not present

## 2022-12-16 ENCOUNTER — Ambulatory Visit: Payer: Medicare HMO | Admitting: Internal Medicine

## 2022-12-16 ENCOUNTER — Encounter: Payer: Self-pay | Admitting: Internal Medicine

## 2022-12-16 VITALS — BP 122/64 | HR 67 | Temp 97.0°F | Resp 20 | Ht 60.0 in | Wt 226.8 lb

## 2022-12-16 DIAGNOSIS — M47816 Spondylosis without myelopathy or radiculopathy, lumbar region: Secondary | ICD-10-CM

## 2022-12-16 MED ORDER — METHYLPREDNISOLONE 4 MG PO TBPK: ORAL_TABLET | ORAL | 0 refills | Status: DC

## 2022-12-16 NOTE — Assessment & Plan Note (Signed)
She has a history of degenerative disc disease with lumbar spondylosis.  Her last MRI of her lumbar spine was done in 2021.  She has an appointment with Dr. Rush Farmer in ortho coming up in 2 weeks.  I am going to place her on a medrol dose pak.  I see no radiculopathy at this point.  She states her pain is controlled on tylenol and methocarbamol.  She may need a repeat MRI when seen.

## 2022-12-16 NOTE — Progress Notes (Signed)
Office Visit  Subjective   Patient ID: [name]   DOB: [birth year]   Age: 82 y.o.   MRN: [id number]   Chief Complaint Chief Complaint  Patient presents with   Back Pain    Right sided back pain that radiates to right leg that has lasted about 2 weeks; appt with ortho later in Jan     History of Present Illness The patient is a 82 yo female who comes in today with complaints of right lower back pain.  She states she has a history of chronic back pain but this began worsening about 10 days.  This pain is a sharp intermittent pain that can be severe.  This is worsened when she puts weight on her right leg.  Her pain can be relieved by sitting down.  She has tried tylenol arthritis and left over methocarbamol.  She states that this pain does not radiate down into her buttocks or her leg.  She denies any weakness or numbness of her legs and there is no new loss of bladder/bowel control.  She does have a known history of lumbar spinal stenosis.  She had a lumbar laminectomy and decompression with microdisectomy of L4-L5 and L5-S1 in 11/2012.  She saw ortho in 2021 for her knee and back where a MRI of her lumbar spine done in 02/2020 (compared to her lumbar MRI in 04/2018) showed lumbar spondylosis and degenerative disc disease with moderate to severe bilateral foraminal stenosis with moderate central narrowing at L4-L5, moderate bilateral foraminal stenosis at L3-4 with moderate central narrowing.  Mild impingement L1-2,  L2-3 and L5-S1.  Overall MRI was stable from prior exam on 04/18/2018 with minimal changes at L2-3 with left lateral recess stenosis.  She is currently in physical therapy twice per week.  She has never had an ESI of her spine.     Past Medical History Past Medical History:  Diagnosis Date   Arthritis    arthritis in joints and back   Benign familial tremor    Clostridium difficile infection    DDD (degenerative disc disease), lumbar    Diverticulosis    DVT (deep venous  thrombosis) (HCC)    left leg   Gallstones    GERD (gastroesophageal reflux disease)    Hypertension    Osteoporosis    Wears glasses      Allergies Allergies  Allergen Reactions   Buprenorphine Hcl Nausea And Vomiting   Codeine Nausea And Vomiting    Other reaction(s): Other (See Comments) But tolerates Tramadol But tolerates Tramadol Vomiting    Morphine And Related Nausea And Vomiting   Morphine     Other reaction(s): Other (See Comments) Vomiting   Procaine     Other reaction(s): Other (See Comments) Other reaction(s): Shakes Novacaine-Shakes     Review of Systems Review of Systems  Constitutional:  Negative for chills and fever.  Respiratory:  Negative for cough and shortness of breath.   Cardiovascular:  Positive for leg swelling. Negative for chest pain and palpitations.  Gastrointestinal:  Negative for constipation, diarrhea, nausea and vomiting.  Neurological:  Negative for weakness.       Objective:    Vitals BP 122/64 (BP Location: Left Arm, Patient Position: Sitting, Cuff Size: Normal)   Pulse 67   Temp (!) 97 F (36.1 C) (Temporal)   Resp 20   Ht 5' (1.524 m)   Wt 226 lb 12.8 oz (102.9 kg)   SpO2 97%   BMI 44.29 kg/m  Physical Examination Physical Exam Constitutional:      Appearance: She is not ill-appearing.  Cardiovascular:     Rate and Rhythm: Normal rate and regular rhythm.     Pulses: Normal pulses.     Heart sounds: No murmur heard.    No friction rub. No gallop.  Pulmonary:     Effort: Pulmonary effort is normal. No respiratory distress.     Breath sounds: No wheezing, rhonchi or rales.  Abdominal:     General: Abdomen is flat. Bowel sounds are normal. There is no distension.     Palpations: Abdomen is soft.     Tenderness: There is no abdominal tenderness.  Musculoskeletal:     Right lower leg: No edema.     Left lower leg: No edema.  Neurological:     General: No focal deficit present.     Mental Status: She is alert  and oriented to person, place, and time.     Comments: There is no weakness in her lower extremities.        Assessment & Plan:   Lumbar spondylosis She has a history of degenerative disc disease with lumbar spondylosis.  Her last MRI of her lumbar spine was done in 2021.  She has an appointment with Dr. Rush Farmer in ortho coming up in 2 weeks.  I am going to place her on a medrol dose pak.  I see no radiculopathy at this point.  She states her pain is controlled on tylenol and methocarbamol.  She may need a repeat MRI when seen.    No follow-ups on file.   Townsend Roger, MD

## 2022-12-22 ENCOUNTER — Encounter: Payer: Self-pay | Admitting: Physician Assistant

## 2022-12-22 ENCOUNTER — Ambulatory Visit (INDEPENDENT_AMBULATORY_CARE_PROVIDER_SITE_OTHER): Payer: Medicare HMO

## 2022-12-22 ENCOUNTER — Ambulatory Visit (INDEPENDENT_AMBULATORY_CARE_PROVIDER_SITE_OTHER): Payer: Medicare HMO | Admitting: Physician Assistant

## 2022-12-22 DIAGNOSIS — G8929 Other chronic pain: Secondary | ICD-10-CM | POA: Diagnosis not present

## 2022-12-22 DIAGNOSIS — M545 Low back pain, unspecified: Secondary | ICD-10-CM

## 2022-12-22 NOTE — Progress Notes (Signed)
Office Visit Note   Patient: [name]           Date of Birth: 1941-04-16           MRN: [id number] Visit Date: 12/22/2022              Requested by: Ginger Organ., MD [address] [place],  [name] [id number] PCP: Ginger Organ., MD  Chief Complaint  Patient presents with   Lower Back - Pain      HPI: [name] is a pleasant 82 year old woman who is accompanied by her daughter.  She lives in an assisted living and has had a long history of low back pain.  She denies any recent injury but has had a flare of her symptoms in the last week or so.  Pain is focally in her back she denies any radicular symptoms or loss of bowel or bladder control.  She especially has difficulty when she goes from a sitting to standing position.  She ambulates at her assisted living with a walker and a scooter.  She has been working with physical therapy at her assisted living which she finds helpful.  She was seen and evaluated a week ago and an oral steroids were prescribed.  She had significant side effects with nausea and vomiting.  Assessment & Plan: Visit Diagnoses:  1. Chronic midline low back pain without sciatica     Plan: Pleasant 82 year old woman with significant listhesis at L4-5 which is about the same as previous x-rays.  She did have an MRI in 2021 which demonstrated impingement and degenerative changes.  L4-5 level seems to be most involved.  I will refer her to Dr. Ernestina Patches for evaluation for possible facet versus ESI.  She has not really had any change in her symptoms just an exacerbation and no radicular findings so I did not feel an MRI at this time is warranted.  We also briefly discussed a back support but she would like to try an injection first  Follow-Up Instructions: No follow-ups on file.   Ortho Exam  Patient is alert, oriented, no adenopathy, well-dressed, normal affect, normal respiratory effort. Examination she is sitting in a wheelchair.  She has 5 out of 5  strength with resisted dorsiflexion and plantarflexion of her feet and ankles.  She does have baseline sensation admit she has a history of neuropathy.  Has 5 out of 5 strength with resisted extension and flexion of her legs.  She has focal tenderness to percussion over the lower spine slightly to the right.  No warmth no erythema she otherwise appears well  Imaging: XR Lumbar Spine 2-3 Views  Result Date: 12/22/2022 2 view radiographs of her lumbar spine were reviewed today.  She does have a listhesis at L4-5 that is almost grade 2.  She has significant facet arthropathy especially in L4-5 L3-4.  No acute fractures are noted.  She also has calcification of the aorta x-ray very similar to those taken in 2022  No images are attached to the encounter.  Labs: Lab Results  Component Value Date   REPTSTATUS 06/25/2022 FINAL 06/19/2022   CULT  06/19/2022    NO GROWTH 5 DAYS Performed at [facility], [place] [address] [place], [place] [address]    LABORGA ESCHERICHIA COLI (A) 01/11/2019     Lab Results  Component Value Date   ALBUMIN 3.7 06/19/2022   ALBUMIN 3.4 (L) 01/10/2019   ALBUMIN 3.7 05/19/2018    No  results found for: "MG" No results found for: "VD25OH"  No results found for: "PREALBUMIN"    Latest Ref Rng & Units 06/19/2022    8:38 PM 06/07/2020    3:32 AM 05/29/2020    3:22 PM  CBC EXTENDED  WBC 4.0 - 10.5 K/uL 9.4  12.5  7.9   RBC 3.87 - 5.11 MIL/uL 4.06  3.62  4.04   Hemoglobin 12.0 - 15.0 g/dL 12.7  11.5  13.3   HCT 36.0 - 46.0 % 38.6  34.8  39.2   Platelets 150 - 400 K/uL 178  183  213   NEUT# 1.7 - 7.7 K/uL 6.6     Lymph# 0.7 - 4.0 K/uL 1.1        There is no height or weight on file to calculate BMI.  Orders:  Orders Placed This Encounter  Procedures   XR Lumbar Spine 2-3 Views   Ambulatory referral to Physical Medicine Rehab   No orders of the defined types were placed in this encounter.    Procedures: No procedures performed  Clinical Data: No  additional findings.  ROS:  All other systems negative, except as noted in the HPI. Review of Systems  Objective: Vital Signs: There were no vitals taken for this visit.  Specialty Comments:  No specialty comments available.  PMFS History: Patient Active Problem List   Diagnosis Date Noted   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/06/2020   Unilateral primary osteoarthritis, left knee 02/21/2020   Depression 05/25/2019   Skin cancer 05/25/2019   Metatarsalgia of right foot 08/04/2018   Pulmonary embolus ([place]) 05/19/2018   Pulmonary embolism ([place]) 05/18/2018   HTN (hypertension) 05/18/2018   Lumbar radiculopathy 11/13/2016   Hallux rigidus of right foot 05/19/2016   Onychomycosis of left great toe 05/19/2016   Gait disturbance 04/05/2016   Lumbar spinal stenosis 02/20/2016   Claudication ([place]) 02/03/2016   Ectropion of both eyes 04/07/2015   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   Vitreous syneresis of both eyes 04/07/2015   Sleep apnea, obstructive 07/29/2014   Acute laryngitis 07/24/2014   Allergic rhinitis 07/24/2014   Hearing loss 07/24/2014   Hip osteoarthritis 07/24/2014   Hoarseness 07/24/2014   Lumbar degenerative disc disease 07/24/2014   Lumbar spondylosis 07/24/2014   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   Urinary incontinence in female 07/24/2014   Epiphora due to insufficient drainage, bilateral 05/23/2014   Essential tremor 04/24/2014   Conjunctivochalasis 03/09/2012   Dermatochalasis 03/09/2012   Hyperopia with astigmatism and presbyopia 03/09/2012   Pinguecula of both eyes 03/09/2012   Past Medical History:  Diagnosis Date   Arthritis    arthritis in joints and back   Benign familial tremor    Clostridium difficile infection    DDD (degenerative disc disease), lumbar    Diverticulosis    DVT (deep venous thrombosis) (HCC)    left leg   Gallstones    GERD (gastroesophageal reflux disease)    Hypertension    Osteoporosis    Wears glasses      Family History  Problem Relation Age of Onset   Other Mother        sarcoma   Other Father        broken hip   Dementia Father    Pneumonia Father    Sleep apnea Neg Hx     Past Surgical History:  Procedure Laterality Date   ABDOMINAL HYSTERECTOMY  1983   arterial vascular fistula  2004   coiled at [facility]  BLADDER REPAIR  1994ish   CARPAL TUNNEL RELEASE  right   CHOLECYSTECTOMY N/A 03/29/2013   Procedure: LAPAROSCOPIC CHOLECYSTECTOMY WITH INTRAOPERATIVE CHOLANGIOGRAM;  Surgeon: Haywood Lasso, MD;  Location: [facility];  Service: General;  Laterality: N/A;   EUS N/A 03/09/2013   Procedure: UPPER ENDOSCOPIC ULTRASOUND (EUS) LINEAR;  Surgeon: Beryle Beams, MD;  Location: [facility];  Service: Endoscopy;  Laterality: N/A;   LUMBAR LAMINECTOMY/DECOMPRESSION MICRODISCECTOMY  11/30/2012   Procedure: LUMBAR LAMINECTOMY/DECOMPRESSION MICRODISCECTOMY 2 LEVELS;  Surgeon: Magnus Sinning, MD;  Location: [facility];  Service: Orthopedics;  Laterality: N/A;  DECOMPRESSION LAMINECTOMY L4-L5, L5-S1 CENTRAL    ROTATOR CUFF REPAIR Bilateral 2004   two on right, one on left   TOTAL KNEE ARTHROPLASTY Left 06/06/2020   Procedure: LEFT TOTAL KNEE ARTHROPLASTY;  Surgeon: [name], MD;  Location: [facility];  Service: Orthopedics;  Laterality: Left;   TRIGGER FINGER RELEASE Right     hand   Social History   Occupational History   Occupation: Futures trader    Comment: partially retired  Tobacco Use   Smoking status: Former    Packs/day: 0.50    Types: Cigarettes    Quit date: 07/11/1970    Years since quitting: 52.4   Smokeless tobacco: Never  Vaping Use   Vaping Use: Never used  Substance and Sexual Activity   Alcohol use: Yes    Alcohol/week: 0.0 standard drinks of alcohol    Comment: wine occasionally   Drug use: No   Sexual activity: Not Currently

## 2022-12-27 ENCOUNTER — Ambulatory Visit: Payer: Medicare HMO | Admitting: Physical Medicine and Rehabilitation

## 2022-12-29 ENCOUNTER — Ambulatory Visit: Payer: Medicare HMO | Admitting: Physical Medicine and Rehabilitation

## 2022-12-29 ENCOUNTER — Encounter: Payer: Self-pay | Admitting: Physical Medicine and Rehabilitation

## 2022-12-29 DIAGNOSIS — G8929 Other chronic pain: Secondary | ICD-10-CM

## 2022-12-29 DIAGNOSIS — L298 Other pruritus: Secondary | ICD-10-CM | POA: Diagnosis not present

## 2022-12-29 DIAGNOSIS — L820 Inflamed seborrheic keratosis: Secondary | ICD-10-CM | POA: Diagnosis not present

## 2022-12-29 DIAGNOSIS — M961 Postlaminectomy syndrome, not elsewhere classified: Secondary | ICD-10-CM | POA: Diagnosis not present

## 2022-12-29 DIAGNOSIS — D1801 Hemangioma of skin and subcutaneous tissue: Secondary | ICD-10-CM | POA: Diagnosis not present

## 2022-12-29 DIAGNOSIS — M47816 Spondylosis without myelopathy or radiculopathy, lumbar region: Secondary | ICD-10-CM | POA: Diagnosis not present

## 2022-12-29 DIAGNOSIS — B078 Other viral warts: Secondary | ICD-10-CM | POA: Diagnosis not present

## 2022-12-29 DIAGNOSIS — D2261 Melanocytic nevi of right upper limb, including shoulder: Secondary | ICD-10-CM | POA: Diagnosis not present

## 2022-12-29 DIAGNOSIS — M545 Low back pain, unspecified: Secondary | ICD-10-CM

## 2022-12-29 DIAGNOSIS — Z85828 Personal history of other malignant neoplasm of skin: Secondary | ICD-10-CM | POA: Diagnosis not present

## 2022-12-29 DIAGNOSIS — L821 Other seborrheic keratosis: Secondary | ICD-10-CM | POA: Diagnosis not present

## 2022-12-29 DIAGNOSIS — L814 Other melanin hyperpigmentation: Secondary | ICD-10-CM | POA: Diagnosis not present

## 2022-12-29 NOTE — Progress Notes (Signed)
[name] - 82 y.o. female MRN [id number]  Date of birth: 1941/08/13  Office Visit Note: Visit Date: 12/29/2022 PCP: Ginger Organ., MD Referred by: Ginger Organ., MD  Subjective: Chief Complaint  Patient presents with   Lower Back - Pain   HPI: [name] is a 82 y.o. female who comes in today for evaluation of chronic bilateral lower back pain. Pain ongoing for several years, worsened about 1 month ago. States pain becomes severe when moving from sitting to standing position. She describes pain as sore and aching, currently rates as 3 out of 10. Some relief of pain with home exercise regimen, rest and use of medications. She was recently prescribed Medrol Dosepack by provider at facility, states this did help to alleviate her pain significantly but did cause profound nausea. Patient currently resides at [facility] in assisted living, she has attended physical therapy at facility and reports some relief of pain with these treatments. Lumbar MRI imaging from 2021 shows multi level facet arthropathy, 9 mm anterolisthesis and multifactorial moderate canal stenosis at L4-L5, posterior decompression at L5-S1. History of previous lumbar surgery at the L5-S1 level, also reports undergoing lumbar epidural steroid injections in the past with some relief of pain. Patient was previously treated by Dr. Lynden Oxford with [facility], per his notes he planned on repeat lumbar injection, however patient did not proceed with this procedure at that time. Patient does have history of balance and gait issues, she was managed by Dr. Wells Guiles Tat with [facility] for many years. She is long time patient of Dr. Jean Rosenthal in our practice. She continues on long term anticoagulant therapy with Eliquis. Patient did voice to me that she is very sedentary, spends most of her time sitting or in wheelchair. She does have cane and scooter she can use to  assist with ambulation. Patient denies focal weakness, numbness and tingling. No recent trauma or falls.    Review of Systems  Musculoskeletal:  Positive for back pain.  Neurological:  Negative for tingling, sensory change, focal weakness and weakness.  All other systems reviewed and are negative.  Otherwise per HPI.  Assessment & Plan: Visit Diagnoses:    ICD-10-CM   1. Chronic bilateral low back pain without sciatica  M54.50    G89.29     2. Spondylosis without myelopathy or radiculopathy, lumbar region  M47.816     3. 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 Patients pain has drastically improved over the last several weeks following regimen of oral steroids. She continues with physical therapy sessions at facility. Patients clinical presentation and exam are consistent with more facet mediated pain, she does have severe pain with lumbar extension. There is moderate spinal canal stenosis noted at the level of L4-L5, however no radicular symptoms at this time and most severe pain seems to be localized to lower back. At this time we will hold on any type of interventional spine procedure as her pain has improved significantly. If her pain returns we would consider facet joint injections or potentially lumbar epidural steroid injection if her symptoms are more radicular in nature. I did discuss continued physical therapy with a focus on lumbar spine strengthening and gait training. I do feel patient would do better overall if she can remain consistently active.  I did provide written order for continued physical therapy, I instructed her to take paper copy back to facility. Patient instructed to call us if her pain worsens. No red flag symptoms noted upon exam today.     Meds & Orders: No orders of the defined types were placed in this encounter.  No orders of the defined types were placed  in this encounter.   Follow-up: Return if symptoms worsen or fail to improve.   Procedures: No procedures performed      Clinical History: EXAM: MRI LUMBAR SPINE WITHOUT CONTRAST   TECHNIQUE: Multiplanar, multisequence MR imaging of the lumbar spine was performed. No intravenous contrast was administered.   COMPARISON:  Lumbar MRI dated 04/18/2018   FINDINGS: Segmentation: The lowest lumbar type non-rib-bearing vertebra is labeled as L5.   Alignment: 9 mm anterolisthesis at L4-5 and 3 mm anterolisthesis at L5-S1, similar to prior.   Vertebrae: No significant vertebral marrow edema is identified. Disc desiccation at all lumbar levels.   Conus medullaris and cauda equina: Conus extends to the L1-2 level. Conus and cauda equina appear normal.   Paraspinal and other soft tissues: Common bile duct 0.9 cm, mildly prominent, similar to 3/20 1/14 CT abdomen.   Disc levels:   T12-L1: No impingement.  Disc bulge and bilateral facet arthropathy.   L1-2: Mild bilateral foraminal stenosis with mild bilateral subarticular lateral recess stenosis and borderline central narrowing of the thecal sac due to disc bulge and facet arthropathy, similar to the prior exam.   L2-3: Mild bilateral foraminal stenosis with mild bilateral subarticular lateral recess stenosis and borderline central narrowing of the thecal sac due to disc bulge and facet arthropathy, with left subarticular lateral recess stenosis mildly increased from the prior exam.   L3-4: Moderate bilateral foraminal stenosis and moderate central narrowing of the thecal sac along with mild bilateral subarticular lateral recess stenosis due to disc bulge, facet arthropathy, and ligamentum flavum redundancy, similar to the prior exam.   L4-5: Moderate to severe bilateral foraminal stenosis with moderate central narrowing of the thecal sac and moderate bilateral subarticular lateral recess stenosis due to disc uncovering,  left foraminal disc protrusion, and facet arthropathy. Not appreciably changed from 04/18/2018.   L5-S1: Mild bilateral foraminal stenosis and mild left subarticular lateral recess stenosis due to disc bulge and facet arthropathy along with disc uncovering, similar to the prior exam. Posterior decompression noted.   IMPRESSION: 1. Lumbar spondylosis and degenerative disc disease, resulting in moderate to prominent impingement at L4-5; moderate impingement at L3-4; and mild impingement at L1-2, L2-3, and L5-S1, as detailed above. Most of the appearance is stable from the patient's 04/18/2018 exam, aside from minimal increase in the left subarticular lateral recess stenosis at L2-3.     Electronically Signed   By: Van Clines M.D.   On: 02/28/2020 17:05   She reports that she quit smoking about 52 years ago. Her smoking use included cigarettes. She smoked an average of .5 packs per day. She has never used smokeless tobacco. No results for input(s): "HGBA1C", "LABURIC" in the last 8760 hours.  Objective:  VS:  HT:    WT:   BMI:     BP:   HR: bpm  TEMP: ( )  RESP:  Physical Exam Vitals and nursing note reviewed.  HENT:     Head: Normocephalic and atraumatic.     Right Ear: External ear normal.     Left Ear: External ear normal.     Nose: Nose  normal.     Mouth/Throat:     Mouth: Mucous membranes are moist.  Eyes:     Extraocular Movements: Extraocular movements intact.  Cardiovascular:     Rate and Rhythm: Normal rate.     Pulses: Normal pulses.  Pulmonary:     Effort: Pulmonary effort is normal.  Abdominal:     General: Abdomen is flat. There is no distension.  Musculoskeletal:        General: Tenderness present.     Cervical back: Normal range of motion.     Comments: Pt is slow to rise from seated position to standing. Concordant low back pain with facet loading, lumbar spine extension and rotation. Strong distal strength without clonus, no pain upon  palpation of greater trochanters. Sensation intact bilaterally. Ambulates with cane, gait unsteady.  Skin:    General: Skin is warm and dry.     Capillary Refill: Capillary refill takes less than 2 seconds.  Neurological:     Mental Status: She is alert and oriented to person, place, and time.     Gait: Gait abnormal.  Psychiatric:        Mood and Affect: Mood normal.        Behavior: Behavior normal.     Ortho Exam  Imaging: No results found.  Past Medical/Family/Surgical/Social History: Medications & Allergies reviewed per EMR, new medications updated. Patient Active Problem List   Diagnosis Date Noted   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/06/2020   Unilateral primary osteoarthritis, left knee 02/21/2020   Depression 05/25/2019   Skin cancer 05/25/2019   Metatarsalgia of right foot 08/04/2018   Pulmonary embolus ([place]) 05/19/2018   Pulmonary embolism ([place]) 05/18/2018   HTN (hypertension) 05/18/2018   Lumbar radiculopathy 11/13/2016   Hallux rigidus of right foot 05/19/2016   Onychomycosis of left great toe 05/19/2016   Gait disturbance 04/05/2016   Lumbar spinal stenosis 02/20/2016   Claudication ([place]) 02/03/2016   Ectropion of both eyes 04/07/2015   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   Vitreous syneresis of both eyes 04/07/2015   Sleep apnea, obstructive 07/29/2014   Acute laryngitis 07/24/2014   Allergic rhinitis 07/24/2014   Hearing loss 07/24/2014   Hip osteoarthritis 07/24/2014   Hoarseness 07/24/2014   Lumbar degenerative disc disease 07/24/2014   Lumbar spondylosis 07/24/2014   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   Urinary incontinence in female 07/24/2014   Epiphora due to insufficient drainage, bilateral 05/23/2014   Essential tremor 04/24/2014   Conjunctivochalasis 03/09/2012   Dermatochalasis 03/09/2012   Hyperopia with astigmatism and presbyopia 03/09/2012   Pinguecula of both eyes 03/09/2012   Past Medical History:  Diagnosis Date    Arthritis    arthritis in joints and back   Benign familial tremor    Clostridium difficile infection    DDD (degenerative disc disease), lumbar    Diverticulosis    DVT (deep venous thrombosis) (HCC)    left leg   Gallstones    GERD (gastroesophageal reflux disease)    Hypertension    Osteoporosis    Wears glasses    Family History  Problem Relation Age of Onset   Other Mother        sarcoma   Other Father        broken hip   Dementia Father    Pneumonia Father    Sleep apnea Neg Hx    Past Surgical History:  Procedure Laterality Date   ABDOMINAL HYSTERECTOMY  1983   arterial vascular fistula  2004  coiled at [place]  right   CHOLECYSTECTOMY N/A 03/29/2013   Procedure: LAPAROSCOPIC CHOLECYSTECTOMY WITH INTRAOPERATIVE CHOLANGIOGRAM;  Surgeon: Haywood Lasso, MD;  Location: [facility];  Service: General;  Laterality: N/A;   EUS N/A 03/09/2013   Procedure: UPPER ENDOSCOPIC ULTRASOUND (EUS) LINEAR;  Surgeon: Beryle Beams, MD;  [facility];  Service: Endoscopy;  Laterality: N/A;   LUMBAR LAMINECTOMY/DECOMPRESSION MICRODISCECTOMY  11/30/2012   Procedure: LUMBAR LAMINECTOMY/DECOMPRESSION MICRODISCECTOMY 2 LEVELS;  Surgeon: Magnus Sinning, MD;  [facility];  Service: Orthopedics;  Laterality: N/A;  DECOMPRESSION LAMINECTOMY L4-L5, L5-S1 CENTRAL    ROTATOR CUFF REPAIR Bilateral 2004   two on right, one on left   TOTAL KNEE ARTHROPLASTY Left 06/06/2020   Procedure: LEFT TOTAL KNEE ARTHROPLASTY;  Surgeon: [name], MD;  [facility];  Service: Orthopedics;  Laterality: Left;   TRIGGER FINGER RELEASE Right     hand   Social History   Occupational History   Occupation: Futures trader    Comment: partially retired  Tobacco Use   Smoking status: Former    Packs/day: 0.50    Types: Cigarettes    Quit date: 07/11/1970    Years since quitting: 52.5   Smokeless tobacco:  Never  Vaping Use   Vaping Use: Never used  Substance and Sexual Activity   Alcohol use: Yes    Alcohol/week: 0.0 standard drinks of alcohol    Comment: wine occasionally   Drug use: No   Sexual activity: Not Currently

## 2022-12-29 NOTE — Progress Notes (Signed)
Consultation to determine if she needs and injection or not. She has lower back pain that has been really bad for 3-4weeks. No leg pain, numbness, or tingling. She takes OTC meds. Sitting and resting helps, but standing makes it worse.    Functional Pain Scale - descriptive words and definitions  Moderate (4)   Constantly aware of pain, can complete ADLs with modification/sleep marginally affected at times/passive distraction is of no use, but active distraction gives some relief. Moderate range order  Average Pain 0   +Driver, +BT, -Dye Allergies.

## 2022-12-30 DIAGNOSIS — M6281 Muscle weakness (generalized): Secondary | ICD-10-CM | POA: Diagnosis not present

## 2022-12-30 DIAGNOSIS — R2689 Other abnormalities of gait and mobility: Secondary | ICD-10-CM | POA: Diagnosis not present

## 2022-12-30 DIAGNOSIS — R2681 Unsteadiness on feet: Secondary | ICD-10-CM | POA: Diagnosis not present

## 2022-12-30 DIAGNOSIS — G20B1 Parkinson's disease with dyskinesia, without mention of fluctuations: Secondary | ICD-10-CM | POA: Diagnosis not present

## 2023-01-03 ENCOUNTER — Ambulatory Visit: Payer: Medicare HMO | Admitting: Orthopaedic Surgery

## 2023-01-04 DIAGNOSIS — R2681 Unsteadiness on feet: Secondary | ICD-10-CM | POA: Diagnosis not present

## 2023-01-04 DIAGNOSIS — M6281 Muscle weakness (generalized): Secondary | ICD-10-CM | POA: Diagnosis not present

## 2023-01-04 DIAGNOSIS — G20B1 Parkinson's disease with dyskinesia, without mention of fluctuations: Secondary | ICD-10-CM | POA: Diagnosis not present

## 2023-01-04 DIAGNOSIS — R2689 Other abnormalities of gait and mobility: Secondary | ICD-10-CM | POA: Diagnosis not present

## 2023-01-05 DIAGNOSIS — N390 Urinary tract infection, site not specified: Secondary | ICD-10-CM | POA: Diagnosis not present

## 2023-01-05 DIAGNOSIS — N3946 Mixed incontinence: Secondary | ICD-10-CM | POA: Diagnosis not present

## 2023-01-05 DIAGNOSIS — N3020 Other chronic cystitis without hematuria: Secondary | ICD-10-CM | POA: Diagnosis not present

## 2023-01-06 DIAGNOSIS — G20B1 Parkinson's disease with dyskinesia, without mention of fluctuations: Secondary | ICD-10-CM | POA: Diagnosis not present

## 2023-01-06 DIAGNOSIS — R2689 Other abnormalities of gait and mobility: Secondary | ICD-10-CM | POA: Diagnosis not present

## 2023-01-06 DIAGNOSIS — R2681 Unsteadiness on feet: Secondary | ICD-10-CM | POA: Diagnosis not present

## 2023-01-06 DIAGNOSIS — M6281 Muscle weakness (generalized): Secondary | ICD-10-CM | POA: Diagnosis not present

## 2023-01-07 ENCOUNTER — Ambulatory Visit: Payer: Medicare HMO

## 2023-01-09 DIAGNOSIS — G4719 Other hypersomnia: Secondary | ICD-10-CM | POA: Diagnosis not present

## 2023-01-09 DIAGNOSIS — G4733 Obstructive sleep apnea (adult) (pediatric): Secondary | ICD-10-CM | POA: Diagnosis not present

## 2023-01-11 DIAGNOSIS — R2689 Other abnormalities of gait and mobility: Secondary | ICD-10-CM | POA: Diagnosis not present

## 2023-01-11 DIAGNOSIS — R2681 Unsteadiness on feet: Secondary | ICD-10-CM | POA: Diagnosis not present

## 2023-01-11 DIAGNOSIS — M6281 Muscle weakness (generalized): Secondary | ICD-10-CM | POA: Diagnosis not present

## 2023-01-11 DIAGNOSIS — G20B1 Parkinson's disease with dyskinesia, without mention of fluctuations: Secondary | ICD-10-CM | POA: Diagnosis not present

## 2023-01-14 DIAGNOSIS — Z86711 Personal history of pulmonary embolism: Secondary | ICD-10-CM | POA: Diagnosis not present

## 2023-01-14 DIAGNOSIS — R609 Edema, unspecified: Secondary | ICD-10-CM | POA: Diagnosis not present

## 2023-01-14 DIAGNOSIS — G629 Polyneuropathy, unspecified: Secondary | ICD-10-CM | POA: Diagnosis not present

## 2023-01-14 DIAGNOSIS — I872 Venous insufficiency (chronic) (peripheral): Secondary | ICD-10-CM | POA: Diagnosis not present

## 2023-01-18 DIAGNOSIS — M6281 Muscle weakness (generalized): Secondary | ICD-10-CM | POA: Diagnosis not present

## 2023-01-18 DIAGNOSIS — R2689 Other abnormalities of gait and mobility: Secondary | ICD-10-CM | POA: Diagnosis not present

## 2023-01-18 DIAGNOSIS — G20B1 Parkinson's disease with dyskinesia, without mention of fluctuations: Secondary | ICD-10-CM | POA: Diagnosis not present

## 2023-01-18 DIAGNOSIS — R2681 Unsteadiness on feet: Secondary | ICD-10-CM | POA: Diagnosis not present

## 2023-01-20 DIAGNOSIS — R2689 Other abnormalities of gait and mobility: Secondary | ICD-10-CM | POA: Diagnosis not present

## 2023-01-20 DIAGNOSIS — M6281 Muscle weakness (generalized): Secondary | ICD-10-CM | POA: Diagnosis not present

## 2023-01-20 DIAGNOSIS — G20B1 Parkinson's disease with dyskinesia, without mention of fluctuations: Secondary | ICD-10-CM | POA: Diagnosis not present

## 2023-01-20 DIAGNOSIS — R2681 Unsteadiness on feet: Secondary | ICD-10-CM | POA: Diagnosis not present

## 2023-01-25 DIAGNOSIS — G20B1 Parkinson's disease with dyskinesia, without mention of fluctuations: Secondary | ICD-10-CM | POA: Diagnosis not present

## 2023-01-25 DIAGNOSIS — M6281 Muscle weakness (generalized): Secondary | ICD-10-CM | POA: Diagnosis not present

## 2023-01-25 DIAGNOSIS — R2689 Other abnormalities of gait and mobility: Secondary | ICD-10-CM | POA: Diagnosis not present

## 2023-01-25 DIAGNOSIS — R2681 Unsteadiness on feet: Secondary | ICD-10-CM | POA: Diagnosis not present

## 2023-01-27 DIAGNOSIS — G20B1 Parkinson's disease with dyskinesia, without mention of fluctuations: Secondary | ICD-10-CM | POA: Diagnosis not present

## 2023-01-27 DIAGNOSIS — M6281 Muscle weakness (generalized): Secondary | ICD-10-CM | POA: Diagnosis not present

## 2023-01-27 DIAGNOSIS — R2681 Unsteadiness on feet: Secondary | ICD-10-CM | POA: Diagnosis not present

## 2023-01-27 DIAGNOSIS — R2689 Other abnormalities of gait and mobility: Secondary | ICD-10-CM | POA: Diagnosis not present

## 2023-02-01 DIAGNOSIS — M6281 Muscle weakness (generalized): Secondary | ICD-10-CM | POA: Diagnosis not present

## 2023-02-01 DIAGNOSIS — G20B1 Parkinson's disease with dyskinesia, without mention of fluctuations: Secondary | ICD-10-CM | POA: Diagnosis not present

## 2023-02-01 DIAGNOSIS — R2689 Other abnormalities of gait and mobility: Secondary | ICD-10-CM | POA: Diagnosis not present

## 2023-02-01 DIAGNOSIS — R2681 Unsteadiness on feet: Secondary | ICD-10-CM | POA: Diagnosis not present

## 2023-02-03 DIAGNOSIS — R2681 Unsteadiness on feet: Secondary | ICD-10-CM | POA: Diagnosis not present

## 2023-02-03 DIAGNOSIS — G20B1 Parkinson's disease with dyskinesia, without mention of fluctuations: Secondary | ICD-10-CM | POA: Diagnosis not present

## 2023-02-03 DIAGNOSIS — H3500 Unspecified background retinopathy: Secondary | ICD-10-CM | POA: Diagnosis not present

## 2023-02-03 DIAGNOSIS — Z961 Presence of intraocular lens: Secondary | ICD-10-CM | POA: Diagnosis not present

## 2023-02-03 DIAGNOSIS — H52203 Unspecified astigmatism, bilateral: Secondary | ICD-10-CM | POA: Diagnosis not present

## 2023-02-03 DIAGNOSIS — M6281 Muscle weakness (generalized): Secondary | ICD-10-CM | POA: Diagnosis not present

## 2023-02-03 DIAGNOSIS — R2689 Other abnormalities of gait and mobility: Secondary | ICD-10-CM | POA: Diagnosis not present

## 2023-02-08 DIAGNOSIS — G20B1 Parkinson's disease with dyskinesia, without mention of fluctuations: Secondary | ICD-10-CM | POA: Diagnosis not present

## 2023-02-08 DIAGNOSIS — M6281 Muscle weakness (generalized): Secondary | ICD-10-CM | POA: Diagnosis not present

## 2023-02-08 DIAGNOSIS — R2689 Other abnormalities of gait and mobility: Secondary | ICD-10-CM | POA: Diagnosis not present

## 2023-02-08 DIAGNOSIS — R2681 Unsteadiness on feet: Secondary | ICD-10-CM | POA: Diagnosis not present

## 2023-02-09 DIAGNOSIS — G4719 Other hypersomnia: Secondary | ICD-10-CM | POA: Diagnosis not present

## 2023-02-09 DIAGNOSIS — G4733 Obstructive sleep apnea (adult) (pediatric): Secondary | ICD-10-CM | POA: Diagnosis not present

## 2023-02-10 DIAGNOSIS — M6281 Muscle weakness (generalized): Secondary | ICD-10-CM | POA: Diagnosis not present

## 2023-02-10 DIAGNOSIS — R2689 Other abnormalities of gait and mobility: Secondary | ICD-10-CM | POA: Diagnosis not present

## 2023-02-10 DIAGNOSIS — R2681 Unsteadiness on feet: Secondary | ICD-10-CM | POA: Diagnosis not present

## 2023-02-10 DIAGNOSIS — G20B1 Parkinson's disease with dyskinesia, without mention of fluctuations: Secondary | ICD-10-CM | POA: Diagnosis not present

## 2023-02-15 DIAGNOSIS — G20B1 Parkinson's disease with dyskinesia, without mention of fluctuations: Secondary | ICD-10-CM | POA: Diagnosis not present

## 2023-02-15 DIAGNOSIS — R2681 Unsteadiness on feet: Secondary | ICD-10-CM | POA: Diagnosis not present

## 2023-02-15 DIAGNOSIS — M6281 Muscle weakness (generalized): Secondary | ICD-10-CM | POA: Diagnosis not present

## 2023-02-15 DIAGNOSIS — R2689 Other abnormalities of gait and mobility: Secondary | ICD-10-CM | POA: Diagnosis not present

## 2023-02-17 ENCOUNTER — Encounter: Payer: Self-pay | Admitting: Radiology

## 2023-02-17 DIAGNOSIS — R2689 Other abnormalities of gait and mobility: Secondary | ICD-10-CM | POA: Diagnosis not present

## 2023-02-17 DIAGNOSIS — R2681 Unsteadiness on feet: Secondary | ICD-10-CM | POA: Diagnosis not present

## 2023-02-17 DIAGNOSIS — G20B1 Parkinson's disease with dyskinesia, without mention of fluctuations: Secondary | ICD-10-CM | POA: Diagnosis not present

## 2023-02-17 DIAGNOSIS — M6281 Muscle weakness (generalized): Secondary | ICD-10-CM | POA: Diagnosis not present

## 2023-02-18 ENCOUNTER — Telehealth: Payer: Self-pay | Admitting: Cardiology

## 2023-02-18 NOTE — Telephone Encounter (Signed)
Contacted Genelle Gather - informed her patient is not cardiac patient.  She states she gave the operator the wrong  patient.

## 2023-02-18 NOTE — Telephone Encounter (Signed)
New Message:   Genelle Gather from [facility] called. She said she need lab orders for this patient faxed to [phone] please.o

## 2023-02-24 DIAGNOSIS — R2681 Unsteadiness on feet: Secondary | ICD-10-CM | POA: Diagnosis not present

## 2023-02-24 DIAGNOSIS — R2689 Other abnormalities of gait and mobility: Secondary | ICD-10-CM | POA: Diagnosis not present

## 2023-02-24 DIAGNOSIS — G20B1 Parkinson's disease with dyskinesia, without mention of fluctuations: Secondary | ICD-10-CM | POA: Diagnosis not present

## 2023-02-24 DIAGNOSIS — M6281 Muscle weakness (generalized): Secondary | ICD-10-CM | POA: Diagnosis not present

## 2023-03-01 DIAGNOSIS — R2689 Other abnormalities of gait and mobility: Secondary | ICD-10-CM | POA: Diagnosis not present

## 2023-03-01 DIAGNOSIS — G20B1 Parkinson's disease with dyskinesia, without mention of fluctuations: Secondary | ICD-10-CM | POA: Diagnosis not present

## 2023-03-01 DIAGNOSIS — M6281 Muscle weakness (generalized): Secondary | ICD-10-CM | POA: Diagnosis not present

## 2023-03-01 DIAGNOSIS — R2681 Unsteadiness on feet: Secondary | ICD-10-CM | POA: Diagnosis not present

## 2023-03-02 DIAGNOSIS — Z86711 Personal history of pulmonary embolism: Secondary | ICD-10-CM | POA: Diagnosis not present

## 2023-03-02 DIAGNOSIS — G3184 Mild cognitive impairment, so stated: Secondary | ICD-10-CM | POA: Diagnosis not present

## 2023-03-02 DIAGNOSIS — R0609 Other forms of dyspnea: Secondary | ICD-10-CM | POA: Diagnosis not present

## 2023-03-02 DIAGNOSIS — F3342 Major depressive disorder, recurrent, in full remission: Secondary | ICD-10-CM | POA: Diagnosis not present

## 2023-03-02 DIAGNOSIS — Z23 Encounter for immunization: Secondary | ICD-10-CM | POA: Diagnosis not present

## 2023-03-02 DIAGNOSIS — M810 Age-related osteoporosis without current pathological fracture: Secondary | ICD-10-CM | POA: Diagnosis not present

## 2023-03-02 DIAGNOSIS — I10 Essential (primary) hypertension: Secondary | ICD-10-CM | POA: Diagnosis not present

## 2023-03-02 DIAGNOSIS — Z7901 Long term (current) use of anticoagulants: Secondary | ICD-10-CM | POA: Diagnosis not present

## 2023-03-02 DIAGNOSIS — D6869 Other thrombophilia: Secondary | ICD-10-CM | POA: Diagnosis not present

## 2023-03-02 DIAGNOSIS — Z86718 Personal history of other venous thrombosis and embolism: Secondary | ICD-10-CM | POA: Diagnosis not present

## 2023-03-02 DIAGNOSIS — Z6841 Body Mass Index (BMI) 40.0 and over, adult: Secondary | ICD-10-CM | POA: Diagnosis not present

## 2023-03-02 DIAGNOSIS — G20A1 Parkinson's disease without dyskinesia, without mention of fluctuations: Secondary | ICD-10-CM | POA: Diagnosis not present

## 2023-03-03 DIAGNOSIS — R2689 Other abnormalities of gait and mobility: Secondary | ICD-10-CM | POA: Diagnosis not present

## 2023-03-03 DIAGNOSIS — R2681 Unsteadiness on feet: Secondary | ICD-10-CM | POA: Diagnosis not present

## 2023-03-03 DIAGNOSIS — M6281 Muscle weakness (generalized): Secondary | ICD-10-CM | POA: Diagnosis not present

## 2023-03-03 DIAGNOSIS — G20B1 Parkinson's disease with dyskinesia, without mention of fluctuations: Secondary | ICD-10-CM | POA: Diagnosis not present

## 2023-03-08 DIAGNOSIS — R2689 Other abnormalities of gait and mobility: Secondary | ICD-10-CM | POA: Diagnosis not present

## 2023-03-08 DIAGNOSIS — G20B1 Parkinson's disease with dyskinesia, without mention of fluctuations: Secondary | ICD-10-CM | POA: Diagnosis not present

## 2023-03-08 DIAGNOSIS — M6281 Muscle weakness (generalized): Secondary | ICD-10-CM | POA: Diagnosis not present

## 2023-03-08 DIAGNOSIS — R2681 Unsteadiness on feet: Secondary | ICD-10-CM | POA: Diagnosis not present

## 2023-03-10 DIAGNOSIS — G20B1 Parkinson's disease with dyskinesia, without mention of fluctuations: Secondary | ICD-10-CM | POA: Diagnosis not present

## 2023-03-10 DIAGNOSIS — G4719 Other hypersomnia: Secondary | ICD-10-CM | POA: Diagnosis not present

## 2023-03-10 DIAGNOSIS — M6281 Muscle weakness (generalized): Secondary | ICD-10-CM | POA: Diagnosis not present

## 2023-03-10 DIAGNOSIS — R2689 Other abnormalities of gait and mobility: Secondary | ICD-10-CM | POA: Diagnosis not present

## 2023-03-10 DIAGNOSIS — R2681 Unsteadiness on feet: Secondary | ICD-10-CM | POA: Diagnosis not present

## 2023-03-10 DIAGNOSIS — G4733 Obstructive sleep apnea (adult) (pediatric): Secondary | ICD-10-CM | POA: Diagnosis not present

## 2023-03-14 NOTE — Progress Notes (Unsigned)
Assessment/Plan:   1.  ET/PD  -Continue carbidopa/levodopa 25/100 2 tablets at 9am/2 tablets at 1pm/1 tablet at 5pm.  Discussed appropriate timing of medication again this visit  -hold propranolol x 1 week.    -DaTscan was abnormal in October, 2021. 2.  Recurrent DVT/PE  -On Eliquis 3.  RBD  -Hold Tylenol PM.  Discussed this last visit as well.  -lexapro may contribute.  Discussed this with her last visit and this visit.  She has not really wanted to change the Lexapro.  She should follow-up with [name] regarding options, as she is now following her for sleep. 4.  Dysphagia  -MBE in October, 2022 with minimal oropharyngeal dysphagia.  Regular diet with thin liquid recommended. 5.  Severe sleep apnea, AHI 82.5  -Now on CPAP and following with [facility]  -having mask leak.  Told her she would need to talk with DME company or call [name] 6.  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  Son states that they will discuss as family and get back to me.  7.  Low back pain  -Following with orthopedics 8.  EDS  -don't think related to Parkinsons Disease as its EDS and not fatigue  -f/u [name]  -did tell her I can d/c her propranolol and she decided to hold it for a week and see how she does. Subjective:   [name] was seen today in follow up for ET/Parkinsons disease.  My previous records were reviewed prior to todays visit as well as outside records available to me.  She is accompanied by her son who supplements the history.  She has had no falls.  No lightheadedness or near syncope.  She has been following with orthopedics regarding back pain.  She saw Dr. Ernestina Patches January 17.  She states that her back is "hanging in there."  She reports that she has been exercising x 2 weeks and is walking to dinner nightly and not riding her scooter.  She has vivid  dreams.  No hallucinations.  She reports she is having leak with her cpap.  She does c/o fatigue but means EDS.  She notes some short term memory trouble.    She has trouble remembering conversations but she says that they aren't important conversations.    Current prescribed movement disorder medications: Carbidopa/levodopa 25/100, 2 tablets at 9am/2 tablets at 1pm/1 tablet at 5pm  Propranolol 20 mg twice per day Aricept, 10 mg daily (prescribed by primary care)  PREVIOUS MEDICATIONS: Topiramate, memory change  ALLERGIES:   Allergies  Allergen Reactions   Buprenorphine Hcl Nausea And Vomiting   Codeine Nausea And Vomiting    Other reaction(s): Other (See Comments) But tolerates Tramadol But tolerates Tramadol Vomiting    Morphine And Related Nausea And Vomiting   Morphine     Other reaction(s): Other (See Comments) Vomiting   Procaine     Other reaction(s): Other (See Comments) Other reaction(s): Shakes Novacaine-Shakes    CURRENT MEDICATIONS:  Outpatient Encounter Medications as of 03/15/2023  Medication Sig   acetaminophen (TYLENOL) 650 MG CR tablet Take 650 mg by mouth as needed for pain.   apixaban (ELIQUIS) 5 MG TABS tablet Take 1 tablet (5 mg total) by mouth 2 (two) times daily.   busPIRone (BUSPAR) 10 MG tablet Take 10 mg by mouth 2 (two) times daily.  carbidopa-levodopa (SINEMET IR) 25-100 MG tablet 2 at 9am, 2 at 1 pm, 1 at 5pm   cholecalciferol (VITAMIN D) 25 MCG (1000 UNIT) tablet Take 1,000 Units by mouth daily.   donepezil (ARICEPT) 10 MG tablet Take 10 mg by mouth at bedtime.   escitalopram (LEXAPRO) 20 MG tablet Take 20 mg by mouth daily.    esomeprazole (NEXIUM) 40 MG capsule Take 40 mg by mouth 2 (two) times daily before a meal.    loratadine (CLARITIN) 10 MG tablet Take 10 mg by mouth at bedtime.   METHOCARBAMOL PO Take by mouth as needed (Back Pain).   mirabegron ER (MYRBETRIQ) 50 MG TB24 tablet Take 50 mg by mouth at bedtime.    olmesartan (BENICAR) 20  MG tablet Take 20 mg by mouth in the morning and at bedtime.    propranolol (INDERAL) 20 MG tablet TAKE 1 TABLET BY MOUTH TWICE A DAY   TOVIAZ 8 MG TB24 tablet Take 8 mg by mouth at bedtime.    vitamin B-12 (CYANOCOBALAMIN) 1000 MCG tablet Take 1,000 mcg by mouth daily.   amLODipine (NORVASC) 2.5 MG tablet Take 2.5 mg by mouth daily. (Patient not taking: Reported on 03/15/2023)   [DISCONTINUED] amoxicillin-clavulanate (AUGMENTIN) 875-125 MG tablet Take 1 tablet by mouth every 12 (twelve) hours. (Patient not taking: Reported on 03/15/2023)   [DISCONTINUED] carvedilol (COREG) 6.25 MG tablet    [DISCONTINUED] furosemide (LASIX) 40 MG tablet Take 40 mg by mouth daily. (Patient not taking: Reported on 03/15/2023)   [DISCONTINUED] methylPREDNISolone (MEDROL DOSEPAK) 4 MG TBPK tablet As directed (Patient not taking: Reported on 03/15/2023)   No facility-administered encounter medications on file as of 03/15/2023.    Objective:   PHYSICAL EXAMINATION:    VITALS:   Vitals:   03/15/23 1310  BP: 136/70  Pulse: 72  SpO2: 98%  Weight: 224 lb 3.2 oz (101.7 kg)  Height: 5' (1.524 m)    GEN:  The patient appears stated age and is in NAD. HEENT:  Normocephalic, atraumatic.  The mucous membranes are moist.   Neurological examination:  Orientation: The patient is alert and oriented x3. Cranial nerves: There is good facial symmetry without facial hypomimia. The speech is fluent and clear. Soft palate rises symmetrically and there is no tongue deviation. Hearing is intact to conversational tone. Sensation: Sensation is intact to light touch throughout Motor: Strength is at least antigravity x4.  Movement examination: Tone: There is nl tone in the UE/LE Abnormal movements: there is intermittent RUE rest tremor Coordination:  There is no decremation, with any form of RAMS, including alternating supination and pronation of the forearm, hand opening and closing, finger taps, heel taps and toe taps. Gait and  Station: The patient has difficulty arising out of a deep-seated chair without the use of the hands. The patients gait is wide-based and somewhat antalgic.  She is short stepped but does not shuffle.  This is true with the walker and without the walker.    I have reviewed and interpreted the following labs independently    Chemistry      Component Value Date/Time   NA 131 (L) 06/19/2022 2038   K 3.8 06/19/2022 2038   CL 99 06/19/2022 2038   CO2 23 06/19/2022 2038   BUN 16 06/19/2022 2038   CREATININE 0.62 06/19/2022 2038      Component Value Date/Time   CALCIUM 8.5 (L) 06/19/2022 2038   ALKPHOS 59 06/19/2022 2038   AST 13 (L) 06/19/2022 2038   ALT  7 06/19/2022 2038   BILITOT 0.7 06/19/2022 2038       Lab Results  Component Value Date   WBC 9.4 06/19/2022   HGB 12.7 06/19/2022   HCT 38.6 06/19/2022   MCV 95.1 06/19/2022   PLT 178 06/19/2022    Lab Results  Component Value Date   TSH 1.749 05/18/2018   No results found for: "VITAMINB12"   Total time spent on today's visit was 34 minutes, including both face-to-face time and nonface-to-face time.  Time included that spent on review of records (prior notes available to me/labs/imaging if pertinent), discussing treatment and goals, answering patient's questions and coordinating care.  Cc:  Ginger Organ., MD

## 2023-03-15 ENCOUNTER — Encounter: Payer: Self-pay | Admitting: Neurology

## 2023-03-15 ENCOUNTER — Ambulatory Visit: Payer: Medicare HMO | Admitting: Neurology

## 2023-03-15 VITALS — BP 136/70 | HR 72 | Ht 60.0 in | Wt 224.2 lb

## 2023-03-15 DIAGNOSIS — G20A1 Parkinson's disease without dyskinesia, without mention of fluctuations: Secondary | ICD-10-CM | POA: Diagnosis not present

## 2023-03-15 DIAGNOSIS — R5383 Other fatigue: Secondary | ICD-10-CM | POA: Diagnosis not present

## 2023-03-15 DIAGNOSIS — G4733 Obstructive sleep apnea (adult) (pediatric): Secondary | ICD-10-CM

## 2023-03-15 NOTE — Patient Instructions (Signed)
HOLD propranolol and let me know in a week or so how you are doing. You can discuss if you want to repeat the memory testing.  We are booked out close to a year if done here.  We can also send you to Dr. Si Raider in [place] to have it repeated with her.  Local and Online Resources for Power over Parkinson's Group  April 2024   LOCAL [place] PARKINSON'S GROUPS   Power over Parkinson's Group:    Power Over Parkinson's Patient Education Group will be Wednesday, April 10th-*Hybrid meting*- in person at [facility] location and via [facility], 2:00-3:00 pm.   Power over [employer] and Care Partner Groups will meet together, with plans for separate break out session for caregivers, depending on topic/speaker Upcoming Power over Parkinson's Meetings/Care Partner Support:  2nd Wednesdays of the month at 2 pm:   April 10th, May 8th [place] at [email or web address] if interested in participating in this group    [place] OFFERINGS  NEW:  Parkinson's Social Game Night.  First Thursday of each month, 2:00-4:00 pm.  *Next date is April 4th*.  [place], [employer].  Contact [email or web address] if interested. Parkinson's CarePartner Group for Men is in the works, if interested email [email or web address] ACT FITNESS Chair Yoga classes "Train and Gain", Fridays 10 am, ACT Fitness.  Contact Gina at [phone].  Mining engineer Classes offering at [employer]!  TUESDAYS (Chair Yoga)  and Wednesdays (PWR! Moves)  1:00 pm.   Contact Vonna Kotyk at  [email or web address]  or [phone]  Drumming for Parkinson's will be held on 2nd and 4th Mondays at 11:00 am.   Located at the [place] ([place].)  Contact Doylene Canning at [email or web address] or [phone]  Dance for Parkinson 's classes will be on Tuesdays 10-11 am.  Located in the [employer] , in the first floor of the [employer] ([place] To register:  [email or web address] or [place] Class, Mondays at 11 am.  Call [phone] for details Hamil-Kerr Challenge.  Bike, Run, [employer] for [employer].  Saturday, April 6th at [facility].  To register, visit [email or web address] Moving Day [facility].  Saturday, May 4th, 10 am start.  Register at [employer].[place]:  [email or web address]  PD Health at Home continues:  Mindfulness Mondays, Wellness Wednesdays, Fitness Fridays  (PWR! Moves as part of Fitness Fridays March 22nd, 1-1:45 pm) Upcoming Education:   Parkinson's 101.  Wednesday, April 3rd, 1-2 pm Movement for Parkinson's.  Wednesday, May 1st, 1-2 pm Expert Briefing:  Research Update:  Working to [employer] PD.  Wednesday, April 10th, 1-2 pm Trouble with Zzz's:  Sleep Challenges with Parkinson's.  Wed, May 8th 1-2 pm Register for virtual education and expert briefings (webinars) at [email or web address] Please check out their website to sign up for emails and see their full online offerings     [place]:  [email or web address]   Third Thursday Webinars:  On the third Thursday of every month at 12 p.m. ET, join our free live webinars to learn about various aspects of living with Parkinson's disease and our work to speed medical breakthroughs.  Upcoming Webinar:  Let's Talk Taboos:  Hard-to-Discuss Parkinson's Symptoms.  Thursday, April 18th at 12 noon. Check out additional information on their website to see their full online offerings  [place]:  [email or web address]  Upcoming Webinar:   Emergent Therapies.  Wednesday, April 2nd, 4 pm Series:  Living with Parkinson's Meetup.   Third Thursdays each month, 3 pm  Care  Partner Monthly Meetup.  With Robin Searing Phinney.  First Tuesday of each month, 2 pm  Check out additional information to Live Well Today on their website    Parkinson and Movement Disorders (PMD) Alliance:  www.[email or web address]  NeuroLife Online:  Online Education Events  Sign up for emails, which are sent weekly to give you updates on programming and online offerings    Parkinson's Association of the [place]:  www.[email or web address]  Information on online support groups, education events, and online exercises including Yoga, Parkinson's exercises and more-LOTS of information on links to PD resources and online events  Virtual Support Group through Parkinson's Association of the [place]; next one is scheduled for Wednesday, April 3rd  MOVEMENT AND EXERCISE OPPORTUNITIES  PWR! Moves Classes at [place].  Wednesdays 10 and 11 am.   Contact Amy Marriott, PT [email or web address] if interested.  Parkinson's Exercise Class offerings at [employer]. *TUESDAYS* (Chair yoga) and Wednesdays (PWR! Moves)  1:00 pm.    Contact Vonna Kotyk at [email or web address]    Parkinson's Wellness Recovery (PWR! Moves)  www.[email or web address]  Info on the PWR! Virtual Experience:  You will have access to our expertise?through self-assessment, guided plans that start with the PD-specific fundamentals, educational content, tips, Q&A with an expert, and a growing Art therapist of PD-specific pre-recorded and live exercise classes of varying types and intensity - both physical and cognitive! If that is not enough, we offer 1:1 wellness consultations (in-person or virtual) to personalize your PWR! Research scientist (medical).   [place] Fridays:   As part of the PD Health @ Home program, this free video series focuses each week on one aspect of fitness designed to support people living with Parkinson's.? These weekly videos highlight the [place] fitness  guidelines for people with Parkinson's disease.  ModemGamers.si  Dance for PD website is offering free, live-stream classes throughout the week, as well as links to [employer] of classes:  [email or web address]  Virtual dance and Pilates for Parkinson's classes: Click on the Community Tab> Parkinson's Movement Initiative Tab.  To register for classes and for more information, visit [email or web address] and click the "community" tab.   [employer] Parkinson's Cycling Classes   [employer]:  Thursdays @ Noon-Live classes at [employer] ([employer] at [email or web address]?or [phone])  [employer]: Classes Tuesday, Wednesday and Thursday (contact [place] at [email or web address] ?or [phone])  [place]  Varied levels of classes are offered Tuesdays and Thursdays at [employer].   Stretching with Verdis Frederickson weekly class is also offered for people with Parkinson's  To observe a class or for more information, call [phone] or email Hezzie Bump at [email or web address]   ADDITIONAL SUPPORT AND RESOURCES  [employer]:  Online Caregiver Education Opportunities:  www.well-springsolutions.org/caregiver-education/caregiver-support-group.  You may also contact Vickki Muff at [email or web address] or [phone].     [employer] April May Decisions Day:  The Most Critical Legal and Medical Decisions to Consider Now!  Tuesday, April 16th, 1-3 pm at [facility] at [facility].  Contact [facility] at [email or web address] or [phone] Powerful Tools for Caregivers.  6 week educational series for caregivers.  April 18-May 23, 10:30 am-12:15 pm at [facility] 3rd Floor Conference Room.   Contact 04-16-1995 at [email or web address] or [phone] to register Well-Spring  Navigator:  Just1Navigator program, a?free service to help individuals and families through the journey of determining care for older adults.  The "Navigator" is a Education officer, museum, Arnell Asal, who will speak with a prospective client and/or loved ones to provide an assessment of the situation and a set of recommendations for a personalized care plan -- all free of charge, and whether?[facility]Spring [employer] offers the needed service or not. If the need is not a service we provide, we are well-connected with reputable programs in town that we can refer you to.  www.well-[email or web address] or to speak with the Navigator, call [phone].

## 2023-03-18 DIAGNOSIS — G20B1 Parkinson's disease with dyskinesia, without mention of fluctuations: Secondary | ICD-10-CM | POA: Diagnosis not present

## 2023-03-18 DIAGNOSIS — M6281 Muscle weakness (generalized): Secondary | ICD-10-CM | POA: Diagnosis not present

## 2023-03-22 DIAGNOSIS — M6281 Muscle weakness (generalized): Secondary | ICD-10-CM | POA: Diagnosis not present

## 2023-03-22 DIAGNOSIS — G20B1 Parkinson's disease with dyskinesia, without mention of fluctuations: Secondary | ICD-10-CM | POA: Diagnosis not present

## 2023-03-24 DIAGNOSIS — G20B1 Parkinson's disease with dyskinesia, without mention of fluctuations: Secondary | ICD-10-CM | POA: Diagnosis not present

## 2023-03-24 DIAGNOSIS — M6281 Muscle weakness (generalized): Secondary | ICD-10-CM | POA: Diagnosis not present

## 2023-03-29 DIAGNOSIS — G20B1 Parkinson's disease with dyskinesia, without mention of fluctuations: Secondary | ICD-10-CM | POA: Diagnosis not present

## 2023-03-29 DIAGNOSIS — M6281 Muscle weakness (generalized): Secondary | ICD-10-CM | POA: Diagnosis not present

## 2023-04-01 ENCOUNTER — Other Ambulatory Visit (HOSPITAL_COMMUNITY): Payer: Self-pay | Admitting: *Deleted

## 2023-04-04 ENCOUNTER — Ambulatory Visit (HOSPITAL_COMMUNITY)
Admission: RE | Admit: 2023-04-04 | Discharge: 2023-04-04 | Disposition: A | Discharge status: Home / Self Care | Payer: Medicare HMO | Source: Ambulatory Visit | Attending: Internal Medicine | Admitting: Internal Medicine

## 2023-04-04 DIAGNOSIS — M810 Age-related osteoporosis without current pathological fracture: Secondary | ICD-10-CM | POA: Insufficient documentation

## 2023-04-04 MED ORDER — ZOLEDRONIC ACID 5 MG/100ML IV SOLN
INTRAVENOUS | Status: AC
  Administered 2023-04-04: 5 mg via INTRAVENOUS
  Filled 2023-04-04: qty 100

## 2023-04-04 MED ORDER — ZOLEDRONIC ACID 5 MG/100ML IV SOLN
5.0000 mg | Freq: Once | INTRAVENOUS | Status: AC
Start: 2023-04-04 — End: 2023-04-04

## 2023-04-04 NOTE — Progress Notes (Signed)
Reclast labs drawn 03/02/23. Called Dr. Alver Fisher office and spoke with Tresa Endo and informed that labs were out of date by only 2 days. Per him okay to proceed, no new labs needed to be drawn.

## 2023-04-05 ENCOUNTER — Encounter: Payer: Self-pay | Admitting: Neurology

## 2023-04-06 MED ORDER — PROPRANOLOL HCL 10 MG PO TABS: 10.0000 mg | ORAL_TABLET | Freq: Two times a day (BID) | ORAL | 1 refills | Status: DC

## 2023-04-10 ENCOUNTER — Other Ambulatory Visit: Payer: Self-pay | Admitting: Neurology

## 2023-04-10 DIAGNOSIS — G20A1 Parkinson's disease without dyskinesia, without mention of fluctuations: Secondary | ICD-10-CM

## 2023-04-10 DIAGNOSIS — G4733 Obstructive sleep apnea (adult) (pediatric): Secondary | ICD-10-CM | POA: Diagnosis not present

## 2023-04-10 DIAGNOSIS — G4719 Other hypersomnia: Secondary | ICD-10-CM | POA: Diagnosis not present

## 2023-05-08 ENCOUNTER — Other Ambulatory Visit: Payer: Self-pay | Admitting: Neurology

## 2023-05-08 DIAGNOSIS — G20A1 Parkinson's disease without dyskinesia, without mention of fluctuations: Secondary | ICD-10-CM

## 2023-05-10 DIAGNOSIS — G4733 Obstructive sleep apnea (adult) (pediatric): Secondary | ICD-10-CM | POA: Diagnosis not present

## 2023-05-10 DIAGNOSIS — G4719 Other hypersomnia: Secondary | ICD-10-CM | POA: Diagnosis not present

## 2023-05-19 DIAGNOSIS — L218 Other seborrheic dermatitis: Secondary | ICD-10-CM | POA: Diagnosis not present

## 2023-05-19 DIAGNOSIS — L820 Inflamed seborrheic keratosis: Secondary | ICD-10-CM | POA: Diagnosis not present

## 2023-05-19 DIAGNOSIS — L570 Actinic keratosis: Secondary | ICD-10-CM | POA: Diagnosis not present

## 2023-05-19 DIAGNOSIS — D1801 Hemangioma of skin and subcutaneous tissue: Secondary | ICD-10-CM | POA: Diagnosis not present

## 2023-05-19 DIAGNOSIS — Z85828 Personal history of other malignant neoplasm of skin: Secondary | ICD-10-CM | POA: Diagnosis not present

## 2023-06-10 DIAGNOSIS — G4719 Other hypersomnia: Secondary | ICD-10-CM | POA: Diagnosis not present

## 2023-06-10 DIAGNOSIS — G4733 Obstructive sleep apnea (adult) (pediatric): Secondary | ICD-10-CM | POA: Diagnosis not present

## 2023-07-05 DIAGNOSIS — N390 Urinary tract infection, site not specified: Secondary | ICD-10-CM | POA: Diagnosis not present

## 2023-07-05 DIAGNOSIS — N3946 Mixed incontinence: Secondary | ICD-10-CM | POA: Diagnosis not present

## 2023-07-10 DIAGNOSIS — G4719 Other hypersomnia: Secondary | ICD-10-CM | POA: Diagnosis not present

## 2023-07-10 DIAGNOSIS — G4733 Obstructive sleep apnea (adult) (pediatric): Secondary | ICD-10-CM | POA: Diagnosis not present

## 2023-07-12 ENCOUNTER — Other Ambulatory Visit: Payer: Self-pay | Admitting: Neurology

## 2023-07-12 DIAGNOSIS — G20A1 Parkinson's disease without dyskinesia, without mention of fluctuations: Secondary | ICD-10-CM

## 2023-08-10 DIAGNOSIS — G4719 Other hypersomnia: Secondary | ICD-10-CM | POA: Diagnosis not present

## 2023-08-10 DIAGNOSIS — G4733 Obstructive sleep apnea (adult) (pediatric): Secondary | ICD-10-CM | POA: Diagnosis not present

## 2023-08-17 ENCOUNTER — Telehealth: Payer: Self-pay | Admitting: Neurology

## 2023-08-17 NOTE — Telephone Encounter (Signed)
Pt is wanting to see if she could talk to someone about something that she is interesting in

## 2023-08-18 NOTE — Telephone Encounter (Signed)
Patient's daughter called and stated that she was returning a phone call

## 2023-08-18 NOTE — Telephone Encounter (Signed)
Called patient and left voicemail.

## 2023-08-18 NOTE — Telephone Encounter (Signed)
I called daughter, it was the mom whom called. She is unsure of what she is wanting. I tried to call Lucilla at [phone]. No answer nor voicemail.

## 2023-09-06 DIAGNOSIS — Z79899 Other long term (current) drug therapy: Secondary | ICD-10-CM | POA: Diagnosis not present

## 2023-09-06 DIAGNOSIS — R7301 Impaired fasting glucose: Secondary | ICD-10-CM | POA: Diagnosis not present

## 2023-09-06 DIAGNOSIS — I10 Essential (primary) hypertension: Secondary | ICD-10-CM | POA: Diagnosis not present

## 2023-09-06 DIAGNOSIS — M810 Age-related osteoporosis without current pathological fracture: Secondary | ICD-10-CM | POA: Diagnosis not present

## 2023-09-06 DIAGNOSIS — Z1389 Encounter for screening for other disorder: Secondary | ICD-10-CM | POA: Diagnosis not present

## 2023-09-13 DIAGNOSIS — Z1331 Encounter for screening for depression: Secondary | ICD-10-CM | POA: Diagnosis not present

## 2023-09-13 DIAGNOSIS — Z1339 Encounter for screening examination for other mental health and behavioral disorders: Secondary | ICD-10-CM | POA: Diagnosis not present

## 2023-09-13 DIAGNOSIS — R82998 Other abnormal findings in urine: Secondary | ICD-10-CM | POA: Diagnosis not present

## 2023-09-13 DIAGNOSIS — Z23 Encounter for immunization: Secondary | ICD-10-CM | POA: Diagnosis not present

## 2023-09-15 DIAGNOSIS — R350 Frequency of micturition: Secondary | ICD-10-CM | POA: Diagnosis not present

## 2023-09-27 DIAGNOSIS — G4733 Obstructive sleep apnea (adult) (pediatric): Secondary | ICD-10-CM | POA: Diagnosis not present

## 2023-09-27 DIAGNOSIS — G4719 Other hypersomnia: Secondary | ICD-10-CM | POA: Diagnosis not present

## 2023-09-27 NOTE — Progress Notes (Unsigned)
Assessment/Plan:   1.  ET/PD  -Continue carbidopa/levodopa 25/100 2 tablets at 9am/2 tablets at 1pm/1 tablet at 5pm.  Discussed appropriate timing of medication again this visit  -On propranolol, 10 mg twice per day.  Higher dosages with more fatigue  -DaTscan was abnormal in October, 2021. 2.  Recurrent DVT/PE  -On Eliquis 3.  RBD  -Hold Tylenol PM.  Discussed this last visit as well.  -lexapro may contribute.  Discussed this with her last visit and this visit.  She has not really wanted to change the Lexapro.  She should follow-up with Dr. Frances Furbish regarding options, as she is now following her for sleep. 4.  Dysphagia  -MBE in October, 2022 with minimal oropharyngeal dysphagia.  Regular diet with thin liquid recommended. 5.  Severe sleep apnea, AHI 82.5  -Now on CPAP and following with [facility] 6.  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 7.  Low back pain  -Following with orthopedics 8.  EDS  -don't think related to Parkinsons Disease as its EDS and not fatigue  -f/u Dr. Frances Furbish Subjective:   [name] was seen today in follow up for ET/Parkinsons disease.  My previous records were reviewed prior to todays visit as well as outside records available to me.  She is accompanied by her son who supplements the history.  Last visit, I held her propranolol to see if it was contributing to fatigue.  She felt that the fatigue was better off of the medication, but the shaking was worse off of the medication.  She went back on the medication at a lower dose.  Current prescribed movement disorder medications: Carbidopa/levodopa 25/100, 2 tablets at 9am/2 tablets at 1pm/1 tablet at 5pm  Propranolol 10 mg twice per day (dosage reduced because of fatigue) Aricept, 10 mg daily (prescribed by primary care)  PREVIOUS MEDICATIONS: Topiramate, memory change; propranolol (higher  dosages with more fatigue)  ALLERGIES:   Allergies  Allergen Reactions   Buprenorphine Hcl Nausea And Vomiting   Codeine Nausea And Vomiting    Other reaction(s): Other (See Comments) But tolerates Tramadol But tolerates Tramadol Vomiting    Morphine And Codeine Nausea And Vomiting   Morphine     Other reaction(s): Other (See Comments) Vomiting   Procaine     Other reaction(s): Other (See Comments) Other reaction(s): Shakes Novacaine-Shakes    CURRENT MEDICATIONS:  Outpatient Encounter Medications as of 09/29/2023  Medication Sig   acetaminophen (TYLENOL) 650 MG CR tablet Take 650 mg by mouth as needed for pain.   amLODipine (NORVASC) 2.5 MG tablet Take 2.5 mg by mouth daily. (Patient not taking: Reported on 03/15/2023)   apixaban (ELIQUIS) 5 MG TABS tablet Take 1 tablet (5 mg total) by mouth 2 (two) times daily.   busPIRone (BUSPAR) 10 MG tablet Take 10 mg by mouth 2 (two) times daily.    carbidopa-levodopa (SINEMET IR) 25-100 MG tablet TAKE 2 TABLETS BY MOUTH AT 9 IN THE MORNING THEN TAKE 2 TABLETS BY MOUTH AT 1 IN THE EVENING THEN TAKE 1 TABLET BY MOUTH AT 5 IN THE EVENING   cholecalciferol (VITAMIN D) 25 MCG (1000 UNIT) tablet Take 1,000 Units by mouth daily.   donepezil (ARICEPT) 10 MG tablet Take 10 mg by mouth at bedtime.   escitalopram (LEXAPRO) 20 MG tablet Take 20 mg by mouth daily.    esomeprazole (NEXIUM) 40 MG capsule Take  40 mg by mouth 2 (two) times daily before a meal.    loratadine (CLARITIN) 10 MG tablet Take 10 mg by mouth at bedtime.   METHOCARBAMOL PO Take by mouth as needed (Back Pain).   mirabegron ER (MYRBETRIQ) 50 MG TB24 tablet Take 50 mg by mouth at bedtime.    olmesartan (BENICAR) 20 MG tablet Take 20 mg by mouth in the morning and at bedtime.    propranolol (INDERAL) 10 MG tablet Take 1 tablet (10 mg total) by mouth 2 (two) times daily.   TOVIAZ 8 MG TB24 tablet Take 8 mg by mouth at bedtime.    vitamin B-12 (CYANOCOBALAMIN) 1000 MCG tablet Take  1,000 mcg by mouth daily.   [DISCONTINUED] carvedilol (COREG) 6.25 MG tablet    No facility-administered encounter medications on file as of 09/29/2023.    Objective:   PHYSICAL EXAMINATION:    VITALS:   There were no vitals filed for this visit.   GEN:  The patient appears stated age and is in NAD. HEENT:  Normocephalic, atraumatic.  The mucous membranes are moist.   Neurological examination:  Orientation: The patient is alert and oriented x3. Cranial nerves: There is good facial symmetry without facial hypomimia. The speech is fluent and clear. Soft palate rises symmetrically and there is no tongue deviation. Hearing is intact to conversational tone. Sensation: Sensation is intact to light touch throughout Motor: Strength is at least antigravity x4.  Movement examination: Tone: There is nl tone in the UE/LE Abnormal movements: there is intermittent RUE rest tremor Coordination:  There is no decremation, with any form of RAMS, including alternating supination and pronation of the forearm, hand opening and closing, finger taps, heel taps and toe taps. Gait and Station: The patient has difficulty arising out of a deep-seated chair without the use of the hands. The patients gait is wide-based and somewhat antalgic.  She is short stepped but does not shuffle.  This is true with the walker and without the walker.    I have reviewed and interpreted the following labs independently    Chemistry      Component Value Date/Time   NA 131 (L) 06/19/2022 2038   K 3.8 06/19/2022 2038   CL 99 06/19/2022 2038   CO2 23 06/19/2022 2038   BUN 16 06/19/2022 2038   CREATININE 0.62 06/19/2022 2038      Component Value Date/Time   CALCIUM 8.5 (L) 06/19/2022 2038   ALKPHOS 59 06/19/2022 2038   AST 13 (L) 06/19/2022 2038   ALT 7 06/19/2022 2038   BILITOT 0.7 06/19/2022 2038       Lab Results  Component Value Date   WBC 9.4 06/19/2022   HGB 12.7 06/19/2022   HCT 38.6 06/19/2022   MCV  95.1 06/19/2022   PLT 178 06/19/2022    Lab Results  Component Value Date   TSH 1.749 05/18/2018   No results found for: "VITAMINB12"   Total time spent on today's visit was *** minutes, including both face-to-face time and nonface-to-face time.  Time included that spent on review of records (prior notes available to me/labs/imaging if pertinent), discussing treatment and goals, answering patient's questions and coordinating care.  Cc:  Cleatis Polka., MD

## 2023-09-29 ENCOUNTER — Ambulatory Visit: Payer: Medicare HMO | Admitting: Neurology

## 2023-09-29 ENCOUNTER — Encounter: Payer: Self-pay | Admitting: Neurology

## 2023-09-29 VITALS — BP 132/72 | HR 71 | Ht 60.0 in | Wt 222.0 lb

## 2023-09-29 DIAGNOSIS — G20A1 Parkinson's disease without dyskinesia, without mention of fluctuations: Secondary | ICD-10-CM

## 2023-09-29 DIAGNOSIS — G4752 REM sleep behavior disorder: Secondary | ICD-10-CM

## 2023-10-02 ENCOUNTER — Other Ambulatory Visit: Payer: Self-pay | Admitting: Neurology

## 2023-10-02 DIAGNOSIS — G20A1 Parkinson's disease without dyskinesia, without mention of fluctuations: Secondary | ICD-10-CM

## 2023-10-06 ENCOUNTER — Other Ambulatory Visit: Payer: Self-pay | Admitting: Neurology

## 2023-10-06 DIAGNOSIS — N3946 Mixed incontinence: Secondary | ICD-10-CM | POA: Diagnosis not present

## 2023-10-06 DIAGNOSIS — G20A1 Parkinson's disease without dyskinesia, without mention of fluctuations: Secondary | ICD-10-CM

## 2023-10-13 DIAGNOSIS — N3946 Mixed incontinence: Secondary | ICD-10-CM | POA: Diagnosis not present

## 2023-10-13 DIAGNOSIS — R350 Frequency of micturition: Secondary | ICD-10-CM | POA: Diagnosis not present

## 2023-10-13 DIAGNOSIS — N3020 Other chronic cystitis without hematuria: Secondary | ICD-10-CM | POA: Diagnosis not present

## 2023-10-14 DIAGNOSIS — R2681 Unsteadiness on feet: Secondary | ICD-10-CM | POA: Diagnosis not present

## 2023-10-14 DIAGNOSIS — G20A1 Parkinson's disease without dyskinesia, without mention of fluctuations: Secondary | ICD-10-CM | POA: Diagnosis not present

## 2023-10-17 DIAGNOSIS — R2681 Unsteadiness on feet: Secondary | ICD-10-CM | POA: Diagnosis not present

## 2023-10-17 DIAGNOSIS — G20A1 Parkinson's disease without dyskinesia, without mention of fluctuations: Secondary | ICD-10-CM | POA: Diagnosis not present

## 2023-10-19 ENCOUNTER — Other Ambulatory Visit: Payer: Self-pay | Admitting: Urology

## 2023-10-21 DIAGNOSIS — R2681 Unsteadiness on feet: Secondary | ICD-10-CM | POA: Diagnosis not present

## 2023-10-21 DIAGNOSIS — G20A1 Parkinson's disease without dyskinesia, without mention of fluctuations: Secondary | ICD-10-CM | POA: Diagnosis not present

## 2023-10-25 DIAGNOSIS — R2681 Unsteadiness on feet: Secondary | ICD-10-CM | POA: Diagnosis not present

## 2023-10-25 DIAGNOSIS — G20A1 Parkinson's disease without dyskinesia, without mention of fluctuations: Secondary | ICD-10-CM | POA: Diagnosis not present

## 2023-10-27 NOTE — Patient Instructions (Signed)
SURGICAL WAITING ROOM VISITATION  Patients having surgery or a procedure may have no more than 2 support people in the waiting area - these visitors may rotate.    Children under the age of 106 must have an adult with them who is not the patient.  Due to an increase in RSV and influenza rates and associated hospitalizations, children ages 36 and under may not visit patients in [facility] hospitals.  If the patient needs to stay at the hospital during part of their recovery, the visitor guidelines for inpatient rooms apply. Pre-op nurse will coordinate an appropriate time for 1 support person to accompany patient in pre-op.  This support person may not rotate.    Please refer to the [facility] website for the visitor guidelines for Inpatients (after your surgery is over and you are in a regular room).       Your procedure is scheduled on: 11/01/23   Report to [facility] Main Entrance    Report to admitting at  6:15 AM   Call this number if you have problems the morning of surgery [phone]   Do not eat food  or drink liquids:After Midnight.                  Oral Hygiene is also important to reduce your risk of infection.                                    Remember - BRUSH YOUR TEETH THE MORNING OF SURGERY WITH YOUR REGULAR TOOTHPASTE  DENTURES WILL BE REMOVED PRIOR TO SURGERY PLEASE DO NOT APPLY "Poly grip" OR ADHESIVES!!!   Stop all vitamins and herbal supplements 7 days before surgery.   Take these medicines the morning of surgery with A SIP OF WATER: Tylenol if needed, buspar, Carbidopa-levodopa, Lexapro, Esomeprazole, Loratadine, Myrbetriq, Propanolol.  Bring CPAP mask and tubing day of surgery.                              You may not have any metal on your body including hair pins, jewelry, and body piercing             Do not wear make-up, lotions, powders, perfumes/cologne, or deodorant  Do not wear nail polish including gel and S&S, artificial/acrylic  nails, or any other type of covering on natural nails including finger and toenails. If you have artificial nails, gel coating, etc. that needs to be removed by a nail salon please have this removed prior to surgery or surgery may need to be canceled/ delayed if the surgeon/ anesthesia feels like they are unable to be safely monitored.   Do not shave  48 hours prior to surgery.    Do not bring valuables to the hospital. [facility] IS NOT             RESPONSIBLE   FOR VALUABLES.   Contacts, glasses, dentures or bridgework may not be worn into surgery.   Bring small overnight bag day of surgery.   DO NOT BRING YOUR HOME MEDICATIONS TO THE HOSPITAL. PHARMACY WILL DISPENSE MEDICATIONS LISTED ON YOUR MEDICATION LIST TO YOU DURING YOUR ADMISSION IN THE HOSPITAL!    Patients discharged on the day of surgery will not be allowed to drive home.  Someone NEEDS to stay with you for the first 24 hours after anesthesia.  Special Instructions: Bring a copy of your healthcare power of attorney and living will documents the day of surgery if you haven't scanned them before.              Please read over the following fact sheets you were given: IF YOU HAVE QUESTIONS ABOUT YOUR PRE-OP INSTRUCTIONS PLEASE CALL [phone] [address] [place]   If you received a COVID test during your pre-op visit  it is requested that you wear a mask when out in public, stay away from anyone that may not be feeling well and notify your surgeon if you develop symptoms. If you test positive for Covid or have been in contact with anyone that has tested positive in the last 10 days please notify you surgeon.    [facility] - Preparing for Surgery Before surgery, you can play an important role.  Because skin is not sterile, your skin needs to be as free of germs as possible.  You can reduce the number of germs on your skin by washing with CHG (chlorahexidine gluconate) soap before surgery.  CHG is an antiseptic cleaner which kills germs and  bonds with the skin to continue killing germs even after washing. Please DO NOT use if you have an allergy to CHG or antibacterial soaps.  If your skin becomes reddened/irritated stop using the CHG and inform your nurse when you arrive at Short Stay. Do not shave (including legs and underarms) for at least 48 hours prior to the first CHG shower.  You may shave your face/neck.  Please follow these instructions carefully:  1.  Shower with CHG Soap the night before surgery and the  morning of surgery.  2.  If you choose to wash your hair, wash your hair first as usual with your normal  shampoo.  3.  After you shampoo, rinse your hair and body thoroughly to remove the shampoo.                             4.  Use CHG as you would any other liquid soap.  You can apply chg directly to the skin and wash.  Gently with a scrungie or clean washcloth.  5.  Apply the CHG Soap to your body ONLY FROM THE NECK DOWN.   Do   not use on face/ open                           Wound or open sores. Avoid contact with eyes, ears mouth and   genitals (private parts).                       Wash face,  Genitals (private parts) with your normal soap.             6.  Wash thoroughly, paying special attention to the area where your    surgery  will be performed.  7.  Thoroughly rinse your body with warm water from the neck down.  8.  DO NOT shower/wash with your normal soap after using and rinsing off the CHG Soap.                9.  Pat yourself dry with a clean towel.            10.  Wear clean pajamas.            11.  Place clean sheets  on your bed the night of your first shower and do not  sleep with pets. Day of Surgery : Do not apply any lotions/deodorants the morning of surgery.  Please wear clean clothes to the hospital/surgery center.  FAILURE TO FOLLOW THESE INSTRUCTIONS MAY RESULT IN THE CANCELLATION OF YOUR SURGERY  PATIENT SIGNATURE_________________________________  NURSE  SIGNATURE__________________________________  ________________________________________________________________________

## 2023-10-27 NOTE — Progress Notes (Addendum)
COVID Vaccine received:  []  No [x]  Yes Date of any COVID positive Test in last 90 days: no PCP - Dr. Martha Clan Cardiologist - no  Chest x-ray -  EKG -   Stress Test - no ECHO - 05/19/18 Epic Cardiac Cath - no  Bowel Prep - [x]  No  []   Yes ______  Pacemaker / ICD device [x]  No []  Yes   Spinal Cord Stimulator:[x]  No []  Yes       History of Sleep Apnea? []  No [x]  Yes   CPAP used?- []  No [x]  Yes    Does the patient monitor blood sugar?          [x]  No []  Yes  []  N/A  Patient has: [x]  NO Hx DM   []  Pre-DM                 []  DM1  []   DM2 Does patient have a [employer] or [employer]? []  No []  Yes   Fasting Blood Sugar Ranges-  Checks Blood Sugar _____ times a day  GLP1 agonist / usual dose - no GLP1 instructions:  SGLT-2 inhibitors / usual dose - no SGLT-2 instructions:   Blood Thinner / Instructions:Eliquis- Last dose is Sat.11/16 Aspirin Instructions:no  Comments:   Activity level: Patient is able to climb a flight of stairs without difficulty; [x]  No CP  [x]  No SOB,___   Patient can perform ADLs without assistance.   Anesthesia review: HTN, OSA, Parkinson's, 1degree AV block   Patient denies shortness of breath, fever, cough and chest pain at PAT appointment.  Patient verbalized understanding and agreement to the Pre-Surgical Instructions that were given to them at this PAT appointment. Patient was also educated of the need to review these PAT instructions again prior to his/her surgery.I reviewed the appropriate phone numbers to call if they have any and questions or concerns.

## 2023-10-28 ENCOUNTER — Other Ambulatory Visit: Payer: Self-pay

## 2023-10-28 ENCOUNTER — Encounter (HOSPITAL_COMMUNITY)
Admission: RE | Admit: 2023-10-28 | Discharge: 2023-10-28 | Disposition: A | Discharge status: Home / Self Care | Payer: Medicare HMO | Source: Ambulatory Visit | Attending: Urology | Admitting: Urology

## 2023-10-28 ENCOUNTER — Encounter (HOSPITAL_COMMUNITY): Payer: Self-pay

## 2023-10-28 VITALS — BP 147/77 | HR 79 | Temp 97.6°F | Resp 20 | Ht 60.0 in | Wt 223.8 lb

## 2023-10-28 DIAGNOSIS — Z86711 Personal history of pulmonary embolism: Secondary | ICD-10-CM | POA: Insufficient documentation

## 2023-10-28 DIAGNOSIS — K219 Gastro-esophageal reflux disease without esophagitis: Secondary | ICD-10-CM | POA: Insufficient documentation

## 2023-10-28 DIAGNOSIS — Z7901 Long term (current) use of anticoagulants: Secondary | ICD-10-CM | POA: Diagnosis not present

## 2023-10-28 DIAGNOSIS — G20A1 Parkinson's disease without dyskinesia, without mention of fluctuations: Secondary | ICD-10-CM | POA: Diagnosis not present

## 2023-10-28 DIAGNOSIS — Z86718 Personal history of other venous thrombosis and embolism: Secondary | ICD-10-CM | POA: Insufficient documentation

## 2023-10-28 DIAGNOSIS — M1990 Unspecified osteoarthritis, unspecified site: Secondary | ICD-10-CM | POA: Diagnosis not present

## 2023-10-28 DIAGNOSIS — I10 Essential (primary) hypertension: Secondary | ICD-10-CM | POA: Insufficient documentation

## 2023-10-28 DIAGNOSIS — G3184 Mild cognitive impairment, so stated: Secondary | ICD-10-CM | POA: Diagnosis not present

## 2023-10-28 DIAGNOSIS — N393 Stress incontinence (female) (male): Secondary | ICD-10-CM | POA: Diagnosis not present

## 2023-10-28 DIAGNOSIS — Z01818 Encounter for other preprocedural examination: Secondary | ICD-10-CM | POA: Diagnosis not present

## 2023-10-28 DIAGNOSIS — R9431 Abnormal electrocardiogram [ECG] [EKG]: Secondary | ICD-10-CM | POA: Insufficient documentation

## 2023-10-28 DIAGNOSIS — G4733 Obstructive sleep apnea (adult) (pediatric): Secondary | ICD-10-CM | POA: Insufficient documentation

## 2023-10-28 DIAGNOSIS — Z01810 Encounter for preprocedural cardiovascular examination: Secondary | ICD-10-CM | POA: Diagnosis not present

## 2023-10-28 DIAGNOSIS — Z01812 Encounter for preprocedural laboratory examination: Secondary | ICD-10-CM | POA: Diagnosis not present

## 2023-10-28 HISTORY — DX: Myoneural disorder, unspecified: G70.9

## 2023-10-28 HISTORY — DX: Dyspnea, unspecified: R06.00

## 2023-10-28 LAB — BASIC METABOLIC PANEL
Anion gap: 9 (ref 5–15)
BUN: 15 mg/dL (ref 8–23)
CO2: 26 mmol/L (ref 22–32)
Calcium: 8.5 mg/dL — ABNORMAL LOW (ref 8.9–10.3)
Chloride: 99 mmol/L (ref 98–111)
Creatinine, Ser: 0.71 mg/dL (ref 0.44–1.00)
GFR, Estimated: >60 mL/min (ref >60)
Glucose, Bld: 89 mg/dL (ref 70–99)
Potassium: 4 mmol/L (ref 3.5–5.1)
Sodium: 134 mmol/L — ABNORMAL LOW (ref 135–145)

## 2023-10-28 LAB — CBC
HCT: 37.4 % (ref 36.0–46.0)
Hemoglobin: 12.2 g/dL (ref 12.0–15.0)
MCH: 31.2 pg (ref 26.0–34.0)
MCHC: 32.6 g/dL (ref 30.0–36.0)
MCV: 95.7 fL (ref 80.0–100.0)
Platelets: 224 10*3/uL (ref 150–400)
RBC: 3.91 MIL/uL (ref 3.87–5.11)
RDW: 13.4 % (ref 11.5–15.5)
WBC: 9.6 10*3/uL (ref 4.0–10.5)
nRBC: 0 % (ref 0.0–0.2)

## 2023-10-31 ENCOUNTER — Encounter (HOSPITAL_COMMUNITY): Payer: Self-pay

## 2023-10-31 NOTE — Progress Notes (Signed)
DISCUSSION: [name] is an 82 yo female who presents to PAT prior to CYSTOSCOPY WITH INJECTION BULKAMID INJECTION on 11/01/23 with Dr. Sherron Monday. PMH of former smoking, HTN, severe OSA (uses CPAP), GERD, Parkinsons disease, hx of DVT/PE on Eliquis, arthritis, mild cognitive impairment, stress incontinence.  Prior anesthesia complication includes possible difficult airway? Has hx of difficult intubation during 11/30/2012 back surgery.  Pt follows with Neurology for hx of Parkinsons disease. Last seen on 09/29/23. Stable.  Follows with PCP for chronic medical issues. Last seen on 09/13/23. BP controlled. Uses Lasix only for peripheral edema prn. Remains on lifelong Eliquis. Last dose is Sat.11/16   VS: BP (!) 147/77   Pulse 79   Temp 36.4 C (Oral)   Resp 20   Ht 5' (1.524 m)   Wt 101.5 kg   SpO2 95%   BMI 43.71 kg/m   PROVIDERS: PCP: Cleatis Polka., MD Neurology: Kerin Salen, DO  LABS: Labs reviewed: Acceptable for surgery. (all labs ordered are listed, but only abnormal results are displayed)  Labs Reviewed  BASIC METABOLIC PANEL - Abnormal; Notable for the following components:      Result Value   Sodium 134 (*)    Calcium 8.5 (*)    All other components within normal limits  CBC     IMAGES:   EKG 10/28/2023:  Sinus rhythm with sinus arrhythmia with 1st degree A-V block Anterolateral infarct , age undetermined   CV:  Echo 05/19/2018:   Study Conclusions   - Left ventricle: The cavity size was normal. There was mild focal    basal hypertrophy of the septum. Systolic function was vigorous.    The estimated ejection fraction was in the range of 65% to 70%.    Wall motion was normal; there were no regional wall motion    abnormalities. Doppler parameters are consistent with abnormal    left ventricular relaxation (grade 1 diastolic dysfunction).    Doppler parameters are consistent with elevated ventricular    end-diastolic filling pressure.  - Aortic  valve: There was mild regurgitation. Valve area (VTI):    2.58 cm^2. Valve area (Vmax): 2.54 cm^2. Valve area (Vmean): 2.78    cm^2.  - Left atrium: The atrium was mildly dilated.   Impressions:   - Normal RV size and systolic function, normal RVSP. No RV strain.   Past Medical History:  Diagnosis Date   Arthritis    arthritis in joints and back   Benign familial tremor    Clostridium difficile infection    DDD (degenerative disc disease), lumbar    Diverticulosis    DVT (deep venous thrombosis) (HCC)    left leg   Dyspnea    Gallstones    GERD (gastroesophageal reflux disease)    Hypertension    Neuromuscular disorder (HCC)    Parkinson's   Osteoporosis    Wears glasses     Past Surgical History:  Procedure Laterality Date   ABDOMINAL HYSTERECTOMY  1983   arterial vascular fistula  2004   coiled at [facility]   BLADDER REPAIR  [phone]   CARPAL TUNNEL RELEASE  right   CHOLECYSTECTOMY N/A 03/29/2013   Procedure: LAPAROSCOPIC CHOLECYSTECTOMY WITH INTRAOPERATIVE CHOLANGIOGRAM;  Surgeon: Currie Paris, MD;  Location: [facility];  Service: General;  Laterality: N/A;   EUS N/A 03/09/2013   Procedure: UPPER ENDOSCOPIC ULTRASOUND (EUS) LINEAR;  Surgeon: Theda Belfast, MD;  Location: [facility];  Service: Endoscopy;  Laterality: N/A;   LUMBAR LAMINECTOMY/DECOMPRESSION  MICRODISCECTOMY  11/30/2012   Procedure: LUMBAR LAMINECTOMY/DECOMPRESSION MICRODISCECTOMY 2 LEVELS;  Surgeon: Drucilla Schmidt, MD;  Location: [facility];  Service: Orthopedics;  Laterality: N/A;  DECOMPRESSION LAMINECTOMY L4-L5, L5-S1 CENTRAL    ROTATOR CUFF REPAIR Bilateral 2004   two on right, one on left   TOTAL KNEE ARTHROPLASTY Left 06/06/2020   Procedure: LEFT TOTAL KNEE ARTHROPLASTY;  Surgeon: Kathryne Hitch, MD;  Location: [facility];  Service: Orthopedics;  Laterality: Left;   TRIGGER FINGER RELEASE Right     hand    MEDICATIONS:  albuterol (VENTOLIN HFA) 108 (90 Base) MCG/ACT  inhaler   apixaban (ELIQUIS) 5 MG TABS tablet   busPIRone (BUSPAR) 10 MG tablet   carbidopa-levodopa (SINEMET IR) 25-100 MG tablet   cholecalciferol (VITAMIN D) 25 MCG (1000 UNIT) tablet   donepezil (ARICEPT) 10 MG tablet   escitalopram (LEXAPRO) 10 MG tablet   esomeprazole (NEXIUM) 40 MG capsule   furosemide (LASIX) 40 MG tablet   loratadine (CLARITIN) 10 MG tablet   mirabegron ER (MYRBETRIQ) 50 MG TB24 tablet   nitrofurantoin, macrocrystal-monohydrate, (MACROBID) 100 MG capsule   NON FORMULARY   olmesartan (BENICAR) 20 MG tablet   propranolol (INDERAL) 10 MG tablet   saccharomyces boulardii (FLORASTOR) 250 MG capsule   TOVIAZ 8 MG TB24 tablet   vitamin B-12 (CYANOCOBALAMIN) 1000 MCG tablet   No current facility-administered medications for this encounter.   Marcille Blanco [facility] Short Stay/Anesthesiology [facility] Phone [phone] 10/31/2023 9:00 AM

## 2023-10-31 NOTE — Anesthesia Preprocedure Evaluation (Addendum)
Anesthesia Evaluation  Patient identified by MRN, date of birth, ID band Patient awake    Reviewed: Allergy & Precautions, NPO status , Patient's Chart, lab work & pertinent test results, reviewed documented beta blocker date and time   History of Anesthesia Complications (+) DIFFICULT AIRWAY and history of anesthetic complications  Airway Mallampati: III  TM Distance: <3 FB Neck ROM: Full    Dental  (+) Teeth Intact, Dental Advisory Given   Pulmonary sleep apnea and Continuous Positive Airway Pressure Ventilation , former smoker   Pulmonary exam normal breath sounds clear to auscultation       Cardiovascular hypertension, Pt. on home beta blockers and Pt. on medications (-) angina + DVT  (-) Past MI Normal cardiovascular exam Rhythm:Regular Rate:Normal     Neuro/Psych  PSYCHIATRIC DISORDERS  Depression    PD Familial tremor   Neuromuscular disease    GI/Hepatic Neg liver ROS,GERD  Medicated,,  Endo/Other    Class 4 obesity  Renal/GU negative Renal ROS Bladder dysfunction      Musculoskeletal  (+) Arthritis ,    Abdominal   Peds  Hematology  (+) Blood dyscrasia (Eliquis)   Anesthesia Other Findings Day of surgery medications reviewed with the patient.  Reproductive/Obstetrics                             Anesthesia Physical Anesthesia Plan  ASA: 3  Anesthesia Plan: MAC   Post-op Pain Management: Tylenol PO (pre-op)*   Induction: Intravenous  PONV Risk Score and Plan: 3 and TIVA, Dexamethasone and Ondansetron  Airway Management Planned: Natural Airway and Simple Face Mask  Additional Equipment:   Intra-op Plan:   Post-operative Plan:   Informed Consent: I have reviewed the patients History and Physical, chart, labs and discussed the procedure including the risks, benefits and alternatives for the proposed anesthesia with the patient or authorized representative who has  indicated his/her understanding and acceptance.     Dental advisory given  Plan Discussed with: CRNA and Anesthesiologist  Anesthesia Plan Comments: (See PAT note from 11/15 by K Kseniya Grunden PA-C )        Anesthesia Quick Evaluation

## 2023-11-01 ENCOUNTER — Ambulatory Visit (HOSPITAL_COMMUNITY): Payer: Medicare HMO | Admitting: Medical

## 2023-11-01 ENCOUNTER — Encounter (HOSPITAL_COMMUNITY): Payer: Self-pay | Admitting: Urology

## 2023-11-01 ENCOUNTER — Telehealth: Payer: Medicare HMO | Admitting: Adult Health

## 2023-11-01 ENCOUNTER — Ambulatory Visit (HOSPITAL_COMMUNITY): Payer: Self-pay | Admitting: Anesthesiology

## 2023-11-01 ENCOUNTER — Ambulatory Visit (HOSPITAL_COMMUNITY)
Admission: RE | Admit: 2023-11-01 | Discharge: 2023-11-01 | Disposition: A | Discharge status: Home / Self Care | Payer: Medicare HMO | Source: Ambulatory Visit | Attending: Urology | Admitting: Urology

## 2023-11-01 ENCOUNTER — Encounter (HOSPITAL_COMMUNITY)
Admission: RE | Disposition: A | Discharge status: Home / Self Care | Payer: Self-pay | Source: Ambulatory Visit | Attending: Urology

## 2023-11-01 DIAGNOSIS — N393 Stress incontinence (female) (male): Secondary | ICD-10-CM | POA: Insufficient documentation

## 2023-11-01 DIAGNOSIS — N3642 Intrinsic sphincter deficiency (ISD): Secondary | ICD-10-CM | POA: Insufficient documentation

## 2023-11-01 DIAGNOSIS — N3020 Other chronic cystitis without hematuria: Secondary | ICD-10-CM | POA: Diagnosis not present

## 2023-11-01 DIAGNOSIS — I10 Essential (primary) hypertension: Secondary | ICD-10-CM | POA: Diagnosis not present

## 2023-11-01 DIAGNOSIS — Z6841 Body Mass Index (BMI) 40.0 and over, adult: Secondary | ICD-10-CM | POA: Diagnosis not present

## 2023-11-01 DIAGNOSIS — Z7901 Long term (current) use of anticoagulants: Secondary | ICD-10-CM | POA: Diagnosis not present

## 2023-11-01 DIAGNOSIS — Z79899 Other long term (current) drug therapy: Secondary | ICD-10-CM | POA: Insufficient documentation

## 2023-11-01 DIAGNOSIS — K219 Gastro-esophageal reflux disease without esophagitis: Secondary | ICD-10-CM | POA: Insufficient documentation

## 2023-11-01 DIAGNOSIS — G4730 Sleep apnea, unspecified: Secondary | ICD-10-CM | POA: Diagnosis not present

## 2023-11-01 DIAGNOSIS — E6689 Other obesity not elsewhere classified: Secondary | ICD-10-CM | POA: Insufficient documentation

## 2023-11-01 DIAGNOSIS — Z87891 Personal history of nicotine dependence: Secondary | ICD-10-CM | POA: Insufficient documentation

## 2023-11-01 HISTORY — PX: CYSTOSCOPY WITH INJECTION: SHX1424

## 2023-11-01 SURGERY — CYSTOSCOPY, WITH INJECTION OF BLADDER NECK OR BLADDER WALL
Anesthesia: Monitor Anesthesia Care

## 2023-11-01 MED ORDER — CIPROFLOXACIN HCL 250 MG PO TABS: 250.0000 mg | ORAL_TABLET | Freq: Two times a day (BID) | ORAL | 0 refills | Status: DC

## 2023-11-01 MED ORDER — GLYCOPYRROLATE 0.2 MG/ML IJ SOLN
INTRAMUSCULAR | Status: AC
  Filled 2023-11-01: qty 1

## 2023-11-01 MED ORDER — CIPROFLOXACIN IN D5W 400 MG/200ML IV SOLN
INTRAVENOUS | Status: AC
  Filled 2023-11-01: qty 200

## 2023-11-01 MED ORDER — KETAMINE HCL 50 MG/5ML IJ SOSY
PREFILLED_SYRINGE | INTRAMUSCULAR | Status: AC
  Filled 2023-11-01: qty 5

## 2023-11-01 MED ORDER — ONDANSETRON HCL 4 MG/2ML IJ SOLN: 4.0000 mg | Freq: Once | INTRAMUSCULAR | Status: DC | PRN

## 2023-11-01 MED ORDER — LIDOCAINE HCL (CARDIAC) PF 100 MG/5ML IV SOSY
PREFILLED_SYRINGE | INTRAVENOUS | Status: DC | PRN
  Administered 2023-11-01: 80 mg via INTRAVENOUS

## 2023-11-01 MED ORDER — PROPOFOL 500 MG/50ML IV EMUL
INTRAVENOUS | Status: AC
Start: 2023-11-01 — End: ?
  Filled 2023-11-01: qty 50

## 2023-11-01 MED ORDER — MIDAZOLAM HCL 2 MG/2ML IJ SOLN
INTRAMUSCULAR | Status: AC
  Filled 2023-11-01: qty 2

## 2023-11-01 MED ORDER — PROPOFOL 500 MG/50ML IV EMUL
INTRAVENOUS | Status: DC | PRN
  Administered 2023-11-01: 50 ug/kg/min via INTRAVENOUS

## 2023-11-01 MED ORDER — ACETAMINOPHEN 500 MG PO TABS
1000.0000 mg | ORAL_TABLET | Freq: Once | ORAL | Status: AC
  Administered 2023-11-01: 1000 mg via ORAL
  Filled 2023-11-01: qty 2

## 2023-11-01 MED ORDER — CHLORHEXIDINE GLUCONATE 0.12 % MT SOLN
15.0000 mL | Freq: Once | OROMUCOSAL | Status: AC
  Administered 2023-11-01: 15 mL via OROMUCOSAL

## 2023-11-01 MED ORDER — STERILE WATER FOR IRRIGATION IR SOLN
Status: DC | PRN
  Administered 2023-11-01: 3000 mL via INTRAVESICAL

## 2023-11-01 MED ORDER — CIPROFLOXACIN IN D5W 400 MG/200ML IV SOLN
INTRAVENOUS | Status: DC | PRN
Start: 2023-11-01 — End: 2023-11-01
  Administered 2023-11-01: 400 mg via INTRAVENOUS

## 2023-11-01 MED ORDER — ORAL CARE MOUTH RINSE: 15.0000 mL | Freq: Once | OROMUCOSAL | Status: AC

## 2023-11-01 MED ORDER — LACTATED RINGERS IV SOLN: INTRAVENOUS | Status: DC

## 2023-11-01 MED ORDER — LIDOCAINE HCL (PF) 2 % IJ SOLN
INTRAMUSCULAR | Status: AC
  Filled 2023-11-01: qty 5

## 2023-11-01 MED ORDER — FENTANYL CITRATE (PF) 100 MCG/2ML IJ SOLN
INTRAMUSCULAR | Status: AC
  Filled 2023-11-01: qty 2

## 2023-11-01 MED ORDER — MIDAZOLAM HCL 5 MG/5ML IJ SOLN
INTRAMUSCULAR | Status: DC | PRN
  Administered 2023-11-01: .5 mg via INTRAVENOUS
  Administered 2023-11-01: 1.5 mg via INTRAVENOUS

## 2023-11-01 MED ORDER — GLYCOPYRROLATE 0.2 MG/ML IJ SOLN
INTRAMUSCULAR | Status: DC | PRN
Start: 2023-11-01 — End: 2023-11-01
  Administered 2023-11-01 (×2): .1 mg via INTRAVENOUS

## 2023-11-01 MED ORDER — KETAMINE HCL 10 MG/ML IJ SOLN
INTRAMUSCULAR | Status: DC | PRN
Start: 2023-11-01 — End: 2023-11-01
  Administered 2023-11-01: 10 mg via INTRAVENOUS

## 2023-11-01 MED ORDER — FENTANYL CITRATE PF 50 MCG/ML IJ SOSY: 25.0000 ug | PREFILLED_SYRINGE | INTRAMUSCULAR | Status: DC | PRN

## 2023-11-01 SURGICAL SUPPLY — 16 items
BAG URO CATCHER STRL LF (MISCELLANEOUS) ×1 IMPLANT
CATH ROBINSON RED A/P 14FR (CATHETERS) IMPLANT
CLOTH BEACON ORANGE TIMEOUT ST (SAFETY) ×1 IMPLANT
GLOVE SURG LX STRL 7.5 STRW (GLOVE) ×1 IMPLANT
GOWN STRL REUS W/ TWL XL LVL3 (GOWN DISPOSABLE) ×1 IMPLANT
GOWN STRL REUS W/TWL XL LVL3 (GOWN DISPOSABLE) ×1
KIT TURNOVER KIT A (KITS) IMPLANT
NDL ASPIRATION 22 (NEEDLE) ×1 IMPLANT
NDL SAFETY ECLIPSE 18X1.5 (NEEDLE) IMPLANT
NEEDLE ASPIRATION 22 (NEEDLE) ×1
PACK CYSTO (CUSTOM PROCEDURE TRAY) ×1 IMPLANT
SYR 10ML LL (SYRINGE) IMPLANT
SYR CONTROL 10ML LL (SYRINGE) IMPLANT
SYSTEM URETHRAL BULK BULKAMID (Female Continence) IMPLANT
TUBING CONNECTING 10 (TUBING) IMPLANT
WATER STERILE IRR 3000ML UROMA (IV SOLUTION) ×1 IMPLANT

## 2023-11-01 NOTE — H&P (Signed)
I reviewed my last note November 23, 2022.  last visit  Patient in the last many months has high-volume urge incontinence soaking 8 pads a day. On this. She has little bit of bedwetting. It is worse when she takes a diuretic and she is actually had weight gain due to fluid retention. She has a walker. In the past has been on trimethoprim and daily Macrodantin. She said C. difficile. She has failed percutaneous tibial nerve stimulation. She has been on Toviaz with Myrbetriq and when we suppress infection she was doing Psychologist, clinical well. I talked about reasonable treatment goals in the past. I did not treat her with Botox in the past. I do not think ever brought up InterStim.  Looked infected and I sent for culture  Patient is on Myrbetriq and Gala Murdoch but she does not know if she is on Macrobid.  I renewed both overactive bladder medications and sent in Macrobid. I also sent in ciprofloxacin 250 mg twice a day for 7 days. She has never tried [place] or Botox and we will talk about this next time after I perform cystoscopy in 5 weeks. I am hoping she down regulates but she has a lot of mobility and medical issues that are compounding the problem. She still is on CPAP   She came back earlier than her appointment in the last culture was positive. Urine looked normal today but sent for culture. She never took Facilities manager. She says she is dramatically better since she had the antibiotic. I called in Macrodantin 100 mg 3 x 11 she will come back at the end of month as scheduled for cystoscopy. She will stay on the Toviaz and Myrbetriq. Overall much improved   Patient is down to 3 pads a day which is a big improvement. She really had almost total incontinence before. Clinically not infected. I like to keep her on the Macrodantin Toviaz and Myrbetriq and reassess her in 6 months  Cystoscopy normal with few white flecks   Last 2 cultures negative. Infection free but sent urine for culture. Incontinence persisting. No  further therapy. All 3 prescriptions renewed and I will see in 1 year   Patient continues use 5 pads a day and is using a double pad system. Clinically not infected. Urine sent for culture. Still has urge incontinence. Importantly she leaks without awareness. Urine looked normal  I reviewed many notes from the past I do not think she is ever had urodynamics. She is on CPAP and Eliquis. She has failed percutaneous tibial nerve stimulation  She can leak a tremendous amount through her close playing cards etc. Yes she is on a blood thinner but organ to go ahead and get urodynamics pelvic exam and cystoscopy and consider Botox and or InterStim recognizing medical comorbidities   Today  Frequency stable. Incontinence stable. Last culture negative  On Macrodantin Toviaz and Myrbetriq  During urodynamics patient did not void and was catheterized 450 mL. Maxim bladder capacity was 573 mL. Bladder was stable. Cough leak point pressure at 400 mL was 9 cm of water with moderate leakage. Valsalva leak point pressure of 25 cmH2O with moderate leakage. At 600 mL her cough leak point pressures 22 cmH2O with moderate leakage. Valsalva leak point pressures 36 cmH2O with moderate to severe leakage. During voiding she did a lot of straining. She voided 17 mL with a maximal flow of 3 mL/s. Maxim voiding pressure 8 cm of water. Residual was 589 mL. Bladder neck distended approximately a centimeter but  she did have a cystocele noted. The details of the urodynamics are signed and dictated   On repeat history. She leaks without awareness in a chair and also a lot when she gets out of a chair. Sometimes she has little bit of bedwetting. 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 When asked when she leaks with coughing sneezing she did not note because she leaks so much  Still on Toviaz Myrbetriq and Macrodantin and clinically not infected  On examination vaginal vault is quite deep based on obesity and  decreased mobility. Very well supported bladder neck. Initially negative cough test. After cystoscopy she had a positive cough test. When I would use a speculum with downward pressure she was started to leak. It may have been triggering or causing more urethral insufficiency. There is no question she had moderate severe stress incontinence on pelvic examination in addition to this finding  Patient underwent flexible cystoscopy. Bladder mucosa and trigone were normal. Urethra length and lumen normal. Few white flecks in urine and urine sent for culture   Patient understands she likely has a combination of an overactive bladder as well as significant urethral insufficiency with stress incontinence and leakage with awareness. She has a lot of medical comorbidities with functional issues. I thought the best neck step would be to do bulking agent therapy under anesthesia. Pros cons risk described in detail. Retreatment rates discussed. Rare risk of erosion and pain and sequelae discussed. She will need medical clearance and stopping of blood thinners   History of drawn. She understands I recommend this over Botox and peripheral nerve evaluation at this stage. I did lower success rates and I think we need to have reasonable treatment goals. She understands the complexity of the issue and that her overactivity certainly can persist. She will speak to pharmacy about pad types. I am hopeful that we can help her to a moderate degree or greater call if culture positive   Patient has significant intrinsic sphincter deficiency     ALLERGIES: Morphine Derivatives    MEDICATIONS: Macrobid 100 mg capsule 1 capsule PO Daily  Myrbetriq 50 mg tablet, extended release 24 hr 1 tablet PO Daily  Nitrofurantoin Mono-Macro 100 mg capsule 1 capsule PO Daily  Toviaz 8 mg tablet, extended release 24 hr 1 tablet PO Daily  Bupropion Hcl  Claritin 10 mg capsule  CPAP  Cymbalta 30 mg capsule,delayed release Oral  Eliquis   Florastor  Nexium 40 mg capsule,delayed release Oral  Primidone 50 mg tablet Oral  Propranolol Hcl 20 mg tablet  Vitamin D3     GU PSH: Complex cystometrogram, w/ void pressure and urethral pressure profile studies, any technique - 10/06/2023 Complex Uroflow - 10/06/2023 Cystoscopy - 01/05/2023 Emg surf Electrd - 10/06/2023 Hysterectomy Unilat SO - 2009 Inject For cystogram - 10/06/2023 Intrabd voidng Press - 10/06/2023       PSH Notes: Rotator Cuff Repair, Bladder Surgery, Hysterectomy, Brain Surgery, Shoulder Surgery, Hand Surgery   NON-GU PSH: Neuroeltrd Stim Post Tibial - 2021, 2021, 2019, 2019, 2019, 2019, 2019, 2019, 2019, 2019, 2018, 2018, 2018, 2018, 2018, 2018 Visit Complexity (formerly GPC1X) - 09/15/2023, 07/05/2023     GU PMH: Mixed incontinence - 10/06/2023, - 09/15/2023, - 07/05/2023, - 01/05/2023, - 12/15/2022, - 11/23/2022, - 11/20/2021, Urge and stress incontinence, - 2017 Urinary Frequency - 10/06/2023, - 2021, Increased urinary frequency, - 2016 Urinary Tract Inf, Unspec site - 09/15/2023, - 07/05/2023, - 01/05/2023, - 12/15/2022, - 11/23/2022, Urinary tract infection, -  2015 Chronic cystitis (w/o hematuria) - 01/05/2023, - 11/20/2021, - 2021, Chronic cystitis, - 2017 Weak Urinary Stream, Weak urinary stream - 2014    NON-GU PMH: Encounter for general adult medical examination without abnormal findings, Encounter for preventive health examination - 2015 Personal history of other diseases of the circulatory system, History of hypertension - 2015 Personal history of other diseases of the digestive system, History of gastroesophageal reflux (GERD) - 2015 Personal history of other diseases of the musculoskeletal system and connective tissue, History of arthritis - 2015 Muscle wasting and atrophy, not elsewhere classified, multiple sites, Disuse muscle atrophy - 2014 Muscle weakness (generalized), Muscle weakness - 2014 Other lack of coordination, Other lack of coordination -  2014 Other muscle spasm, Muscle spasm - 2014 Enterocolitis due to Clostridium difficile    FAMILY HISTORY: Chronic Lymphoma - Mother Death In The Family Father - Father Death In The Family Mother - Mother Death of family member - Runs In Family Family Health Status Number - Runs In Family   SOCIAL HISTORY: No Social History     Notes: Number of children, Occupation, Caffeine use, Alcohol use, Married, Former smoker, Exercise Habits, Self-reliant In Usual Daily Activities, Activities Of Daily Living, Living Independently With Spouse, Marital History - Currently Married, Alcohol Use, Caffeine Use, Tobacco Use, Occupation:   REVIEW OF SYSTEMS:    GU Review Female:   Patient reports frequent urination, hard to postpone urination, get up at night to urinate, and leakage of urine. Patient denies burning /pain with urination, stream starts and stops, trouble starting your stream, have to strain to urinate, and being pregnant.  Gastrointestinal (Upper):   Patient denies nausea, vomiting, and indigestion/ heartburn.  Gastrointestinal (Lower):   Patient denies diarrhea and constipation.  Constitutional:   Patient denies fever, night sweats, weight loss, and fatigue.  Skin:   Patient denies skin rash/ lesion and itching.  Eyes:   Patient denies blurred vision and double vision.  Ears/ Nose/ Throat:   Patient denies sore throat and sinus problems.  Hematologic/Lymphatic:   Patient denies swollen glands and easy bruising.  Cardiovascular:   Patient denies leg swelling and chest pains.  Respiratory:   Patient denies cough and shortness of breath.  Endocrine:   Patient denies excessive thirst.  Musculoskeletal:   Patient denies back pain and joint pain.  Neurological:   Patient denies headaches and dizziness.  Psychologic:   Patient denies depression and anxiety.   VITAL SIGNS: None   PAST DATA REVIEW: None   PROCEDURES:         Flexible Cystoscopy - 52000  Risks, benefits, and some of the  potential complications of the procedure were discussed at length with the patient including infection, bleeding, voiding discomfort, urinary retention, fever, chills, sepsis, and others. All questions were answered. Informed consent was obtained. Sterile technique and intraurethral analgesia were used.  Ureteral Orifices:  Normal location. Normal size. Normal shape. Effluxed clear urine.  Bladder:  Patient underwent flexible cystoscopy. Bladder mucosa and trigone were normal. Urethra length and lumen normal. Few white flecks in urine and urine sent for culture      The lower urinary tract was carefully examined. There was no stitch, foreign, body, or carcinoma. The procedure was well-tolerated and without complications. Antibiotic instructions were given. Instructions were given to call the office immediately for bloody urine, difficulty urinating, urinary retention, painful or frequent urination, fever, chills, nausea, vomiting or other illness. The patient stated that she understood these instructions and would comply with  them.         Visit Complexity - G2211          Urinalysis w/Scope Dipstick Dipstick Cont'd Micro  Color: Yellow Bilirubin: Neg mg/dL WBC/hpf: NS (Not Seen)  Appearance: Slightly Cloudy Ketones: Neg mg/dL RBC/hpf: NS (Not Seen)  Specific Gravity: 1.015 Blood: Neg ery/uL Bacteria: Few (10-25/hpf)  pH: 6.0 Protein: Neg mg/dL Cystals: NS (Not Seen)  Glucose: Neg mg/dL Urobilinogen: 0.2 mg/dL Casts: NS (Not Seen)    Nitrites: Neg Trichomonas: Not Present    Leukocyte Esterase: Neg leu/uL Mucous: Not Present      Epithelial Cells: 10 - 20/hpf      Yeast: NS (Not Seen)      Sperm: Not Present    ASSESSMENT:      ICD-10 Details  1 GU:   Chronic cystitis (w/o hematuria) - N30.20   2   Mixed incontinence - N39.46               Notes:   The patient has stress incontinence We talked about urethral injectables in detail. Pros, cons, general surgical and anesthetic risks, and  other options including behavioral therapy, watchful waiting, and surgery were discussed. She understands that injectables are generally successful in 40-70% of cases for stress incontinence, <50% for urge incontinence, and that in a small % of cases the incontinence can worsen. Risks were described but not limited to the risk of persistent, de novo, or worsening incontinence and flow symptoms were discussed. The need for long-term CIC is possible but rare. We also talked about the risk of erosion into bladder, urethra, and/or vagina with sequelae. Bleeding, infection, pain, dysuria, dyspareunia, and urethral irritation risks were discussed. Retreatment rates were discussed. The patient understands that she might not reach her treatment goal and that she might be worse following surgery.

## 2023-11-01 NOTE — Discharge Instructions (Signed)
I have reviewed discharge instructions in detail with the patient. They will follow-up with me or their physician as scheduled. My nurse will also be calling the patients as per protocol.   

## 2023-11-01 NOTE — Transfer of Care (Signed)
Immediate Anesthesia Transfer of Care Note  Patient: [name]  Procedure(s) Performed: CYSTOSCOPY WITH INJECTION BULKAMID INJECTION  Patient Location: PACU  Anesthesia Type:MAC  Level of Consciousness: awake and drowsy  Airway & Oxygen Therapy: Patient Spontanous Breathing  Post-op Assessment: Report given to RN and Post -op Vital signs reviewed and stable  Post vital signs: Reviewed and stable  Last Vitals:  Vitals Value Taken Time  BP 125/67 11/01/23 0919  Temp    Pulse 68 11/01/23 0920  Resp 17 11/01/23 0920  SpO2 98 % 11/01/23 0920  Vitals shown include unfiled device data.  Last Pain:  Vitals:   11/01/23 0654  TempSrc:   PainSc: 0-No pain         Complications: No notable events documented.

## 2023-11-01 NOTE — Op Note (Signed)
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 Lorin Picket [name]  The patient has the above diagnosis and consented above procedure.  Preoperative antibiotics were given.  Extra care was taken leg positioning  With the injection scope I scanned the bladder.  Bladder mucosa and trigone were normal.  She had a straight healthy urethra.  I injected 1.3 cc of Bulkamid and the coaptation was excellent.  I injected at 7:00 and then 5:00 followed by 10:00 and 2:00.  The coaptation was excellent.  Depth was excellent.  I partially drain the bladder with a 14 French red rubber catheter.  She will be followed as per protocol

## 2023-11-01 NOTE — Anesthesia Postprocedure Evaluation (Signed)
Anesthesia Post Note  Patient: [name]  Procedure(s) Performed: CYSTOSCOPY WITH INJECTION BULKAMID INJECTION     Patient location during evaluation: PACU Anesthesia Type: MAC Level of consciousness: awake and alert Pain management: pain level controlled Vital Signs Assessment: post-procedure vital signs reviewed and stable Respiratory status: spontaneous breathing, nonlabored ventilation, respiratory function stable and patient connected to nasal cannula oxygen Cardiovascular status: stable and blood pressure returned to baseline Postop Assessment: no apparent nausea or vomiting Anesthetic complications: no   No notable events documented.  Last Vitals:  Vitals:   11/01/23 0945 11/01/23 1015  BP: 132/77 134/67  Pulse: 62 63  Resp: 19 15  Temp: (!) 36.3 C   SpO2: 96% 95%    Last Pain:  Vitals:   11/01/23 1015  TempSrc:   PainSc: 0-No pain                 Collene Schlichter

## 2023-11-02 ENCOUNTER — Encounter (HOSPITAL_COMMUNITY): Payer: Self-pay | Admitting: Urology

## 2023-11-03 DIAGNOSIS — G20A1 Parkinson's disease without dyskinesia, without mention of fluctuations: Secondary | ICD-10-CM | POA: Diagnosis not present

## 2023-11-03 DIAGNOSIS — R2681 Unsteadiness on feet: Secondary | ICD-10-CM | POA: Diagnosis not present

## 2023-11-07 DIAGNOSIS — G20A1 Parkinson's disease without dyskinesia, without mention of fluctuations: Secondary | ICD-10-CM | POA: Diagnosis not present

## 2023-11-07 DIAGNOSIS — R2681 Unsteadiness on feet: Secondary | ICD-10-CM | POA: Diagnosis not present

## 2023-11-09 DIAGNOSIS — R2681 Unsteadiness on feet: Secondary | ICD-10-CM | POA: Diagnosis not present

## 2023-11-09 DIAGNOSIS — G20A1 Parkinson's disease without dyskinesia, without mention of fluctuations: Secondary | ICD-10-CM | POA: Diagnosis not present

## 2023-11-14 DIAGNOSIS — R2681 Unsteadiness on feet: Secondary | ICD-10-CM | POA: Diagnosis not present

## 2023-11-14 DIAGNOSIS — G20A1 Parkinson's disease without dyskinesia, without mention of fluctuations: Secondary | ICD-10-CM | POA: Diagnosis not present

## 2023-11-17 DIAGNOSIS — N3946 Mixed incontinence: Secondary | ICD-10-CM | POA: Diagnosis not present

## 2023-11-18 DIAGNOSIS — G20A1 Parkinson's disease without dyskinesia, without mention of fluctuations: Secondary | ICD-10-CM | POA: Diagnosis not present

## 2023-11-18 DIAGNOSIS — R2681 Unsteadiness on feet: Secondary | ICD-10-CM | POA: Diagnosis not present

## 2023-11-21 DIAGNOSIS — G20A1 Parkinson's disease without dyskinesia, without mention of fluctuations: Secondary | ICD-10-CM | POA: Diagnosis not present

## 2023-11-21 DIAGNOSIS — R2681 Unsteadiness on feet: Secondary | ICD-10-CM | POA: Diagnosis not present

## 2023-11-25 DIAGNOSIS — R2681 Unsteadiness on feet: Secondary | ICD-10-CM | POA: Diagnosis not present

## 2023-11-25 DIAGNOSIS — G20A1 Parkinson's disease without dyskinesia, without mention of fluctuations: Secondary | ICD-10-CM | POA: Diagnosis not present

## 2023-11-28 DIAGNOSIS — R2681 Unsteadiness on feet: Secondary | ICD-10-CM | POA: Diagnosis not present

## 2023-11-28 DIAGNOSIS — G20A1 Parkinson's disease without dyskinesia, without mention of fluctuations: Secondary | ICD-10-CM | POA: Diagnosis not present

## 2023-12-02 DIAGNOSIS — R2681 Unsteadiness on feet: Secondary | ICD-10-CM | POA: Diagnosis not present

## 2023-12-02 DIAGNOSIS — G20A1 Parkinson's disease without dyskinesia, without mention of fluctuations: Secondary | ICD-10-CM | POA: Diagnosis not present

## 2023-12-05 DIAGNOSIS — S92314A Nondisplaced fracture of first metatarsal bone, right foot, initial encounter for closed fracture: Secondary | ICD-10-CM | POA: Diagnosis not present

## 2023-12-05 DIAGNOSIS — M25571 Pain in right ankle and joints of right foot: Secondary | ICD-10-CM | POA: Diagnosis not present

## 2023-12-12 DIAGNOSIS — G20A1 Parkinson's disease without dyskinesia, without mention of fluctuations: Secondary | ICD-10-CM | POA: Diagnosis not present

## 2023-12-12 DIAGNOSIS — R2681 Unsteadiness on feet: Secondary | ICD-10-CM | POA: Diagnosis not present

## 2023-12-15 ENCOUNTER — Ambulatory Visit: Payer: Medicare HMO | Admitting: Adult Health

## 2023-12-15 DIAGNOSIS — G20A1 Parkinson's disease without dyskinesia, without mention of fluctuations: Secondary | ICD-10-CM | POA: Diagnosis not present

## 2023-12-15 DIAGNOSIS — R2681 Unsteadiness on feet: Secondary | ICD-10-CM | POA: Diagnosis not present

## 2023-12-17 ENCOUNTER — Other Ambulatory Visit: Payer: Self-pay | Admitting: Neurology

## 2023-12-17 DIAGNOSIS — G20A1 Parkinson's disease without dyskinesia, without mention of fluctuations: Secondary | ICD-10-CM

## 2023-12-19 DIAGNOSIS — R2681 Unsteadiness on feet: Secondary | ICD-10-CM | POA: Diagnosis not present

## 2023-12-19 DIAGNOSIS — G20A1 Parkinson's disease without dyskinesia, without mention of fluctuations: Secondary | ICD-10-CM | POA: Diagnosis not present

## 2023-12-21 ENCOUNTER — Other Ambulatory Visit (INDEPENDENT_AMBULATORY_CARE_PROVIDER_SITE_OTHER): Payer: Medicare HMO

## 2023-12-21 ENCOUNTER — Ambulatory Visit: Payer: Medicare HMO | Admitting: Orthopaedic Surgery

## 2023-12-21 DIAGNOSIS — Z96652 Presence of left artificial knee joint: Secondary | ICD-10-CM | POA: Diagnosis not present

## 2023-12-21 NOTE — Progress Notes (Signed)
 The patient is an almost 83 year old female who we replaced her left knee 3 and half years ago.  She does let us  know that since surgery she has gained about 20 pounds.  She does ambulate with a rolling walker and states that [facility] assisted living facility.  She states she has times where she feels like her knee is giving way on her.  She wanted make sure this was checked out today.  She is a regular patient of Dr. Loreli of [facility].  Examination of both her knees shows she is significantly obese.  Her left operative knee has full range of motion and it does not feel unstable to me on exam.  2 views of the left knee show well-seated total knee arthroplasty.  On repeat exam again there is no effusion either.  There is no warmth and redness.  I would like her to try some outpatient physical therapy but at [facility] with specifically working on quad strengthening and knee strengthening exercises.  We can then see her back in 2 months to see how therapy has done for her.  She agrees with this treatment plan.

## 2023-12-22 ENCOUNTER — Other Ambulatory Visit (HOSPITAL_BASED_OUTPATIENT_CLINIC_OR_DEPARTMENT_OTHER): Payer: Self-pay

## 2023-12-22 ENCOUNTER — Encounter (HOSPITAL_BASED_OUTPATIENT_CLINIC_OR_DEPARTMENT_OTHER): Payer: Self-pay

## 2023-12-22 DIAGNOSIS — H52203 Unspecified astigmatism, bilateral: Secondary | ICD-10-CM | POA: Diagnosis not present

## 2023-12-22 DIAGNOSIS — F3342 Major depressive disorder, recurrent, in full remission: Secondary | ICD-10-CM | POA: Diagnosis not present

## 2023-12-22 DIAGNOSIS — M4800 Spinal stenosis, site unspecified: Secondary | ICD-10-CM | POA: Diagnosis not present

## 2023-12-22 DIAGNOSIS — F419 Anxiety disorder, unspecified: Secondary | ICD-10-CM | POA: Diagnosis not present

## 2023-12-22 DIAGNOSIS — I10 Essential (primary) hypertension: Secondary | ICD-10-CM | POA: Diagnosis not present

## 2023-12-22 DIAGNOSIS — R7301 Impaired fasting glucose: Secondary | ICD-10-CM | POA: Diagnosis not present

## 2023-12-22 DIAGNOSIS — R635 Abnormal weight gain: Secondary | ICD-10-CM | POA: Diagnosis not present

## 2023-12-22 DIAGNOSIS — H04123 Dry eye syndrome of bilateral lacrimal glands: Secondary | ICD-10-CM | POA: Diagnosis not present

## 2023-12-22 DIAGNOSIS — G4733 Obstructive sleep apnea (adult) (pediatric): Secondary | ICD-10-CM | POA: Diagnosis not present

## 2023-12-22 DIAGNOSIS — Z961 Presence of intraocular lens: Secondary | ICD-10-CM | POA: Diagnosis not present

## 2023-12-22 MED ORDER — ZEPBOUND 5 MG/0.5ML ~~LOC~~ SOAJ
5.0000 mg | SUBCUTANEOUS | 5 refills | Status: DC
  Filled 2023-12-22: qty 2, 28d supply, fill #0

## 2023-12-22 MED ORDER — ZEPBOUND 2.5 MG/0.5ML ~~LOC~~ SOAJ
2.5000 mg | SUBCUTANEOUS | 0 refills | Status: DC
  Filled 2023-12-22 – 2023-12-27 (×2): qty 2, 28d supply, fill #0

## 2023-12-26 DIAGNOSIS — R2681 Unsteadiness on feet: Secondary | ICD-10-CM | POA: Diagnosis not present

## 2023-12-26 DIAGNOSIS — G20A1 Parkinson's disease without dyskinesia, without mention of fluctuations: Secondary | ICD-10-CM | POA: Diagnosis not present

## 2023-12-27 ENCOUNTER — Other Ambulatory Visit (HOSPITAL_BASED_OUTPATIENT_CLINIC_OR_DEPARTMENT_OTHER): Payer: Self-pay

## 2023-12-28 ENCOUNTER — Other Ambulatory Visit: Payer: Self-pay | Admitting: Neurology

## 2023-12-28 DIAGNOSIS — G20A1 Parkinson's disease without dyskinesia, without mention of fluctuations: Secondary | ICD-10-CM

## 2023-12-30 ENCOUNTER — Other Ambulatory Visit (HOSPITAL_BASED_OUTPATIENT_CLINIC_OR_DEPARTMENT_OTHER): Payer: Self-pay

## 2024-01-02 ENCOUNTER — Other Ambulatory Visit (HOSPITAL_BASED_OUTPATIENT_CLINIC_OR_DEPARTMENT_OTHER): Payer: Self-pay

## 2024-01-04 ENCOUNTER — Other Ambulatory Visit (HOSPITAL_BASED_OUTPATIENT_CLINIC_OR_DEPARTMENT_OTHER): Payer: Self-pay

## 2024-01-05 DIAGNOSIS — R2681 Unsteadiness on feet: Secondary | ICD-10-CM | POA: Diagnosis not present

## 2024-01-05 DIAGNOSIS — G20A1 Parkinson's disease without dyskinesia, without mention of fluctuations: Secondary | ICD-10-CM | POA: Diagnosis not present

## 2024-01-09 ENCOUNTER — Encounter: Payer: Self-pay | Admitting: Anesthesiology

## 2024-01-09 DIAGNOSIS — G20A1 Parkinson's disease without dyskinesia, without mention of fluctuations: Secondary | ICD-10-CM | POA: Diagnosis not present

## 2024-01-09 DIAGNOSIS — R2681 Unsteadiness on feet: Secondary | ICD-10-CM | POA: Diagnosis not present

## 2024-01-10 NOTE — Progress Notes (Unsigned)
Patient: [name] Date of Birth: 1941/03/28  Reason for Visit: Follow up History from: Patient, daughter Primary Neurologist: [name]   ASSESSMENT AND PLAN 83 y.o. year old female   1.  OSA on CPAP -Commended on superb compliance.  Will continue current settings.  I will pull a download in 1 month, as she is going to work on loosening her mask a little bit to see if more subjective tolerability.  In 1 month, if she has significant mask leak or change in AHI we will pursue a mask refit.  She will continue nightly usage for minimum of 4 hours.  Follow-up in 1 year. -HST September 2023 showed severe OSA with AHI 82.5/hour, O2 nadir 84%.  Started new CPAP 09/09/2022.  HISTORY OF PRESENT ILLNESS: Today 01/11/24 Here with her daughter for follow up on CPAP. She is trying to like it, laughs about it.  Is very diligent about nightly use.  Is using a nasal air fit medium mask.  Sometimes the mask around her nose feels too tight.  Last night she loosened it a lot and slept great.  The tubing is aggravating to her. Is cumbersome to open the water chamber for her. With her tremor is hard to open and close. Trying to get on Zepbound, going through insurance.  Continues with afternoon fatigue.  Remains on Sinemet and propranolol from Dr. Arbutus Mann.  ESS 10.  Attached download shows 100% compliance, AHI 0.4.  Leak 15.2.  The data overall looks very good from the CPAP compliance and settings.  HISTORY  11/09/22 Dr. Frances Mann: Ms. [name] is an 83 year old right-handed woman with an underlying medical history of arthritis, degenerative disc disease, diverticulosis, history of DVT, reflux disease, hypertension, osteoporosis, tremor and parkinsonism (followed by Dr. Arbutus Mann at [facility]), and severe obesity with a BMI over 40, who presents for follow-up consultation of her obstructive sleep apnea after interim testing and starting treatment with a new AutoPap machine.  The patient is accompanied by her daughter again today.   I saw her on 07/29/2022 for reevaluation of her sleep apnea.  She had not used her PAP machine in about 3 years at the time.  We proceeded with a home sleep test.  She had a home sleep test on 08/30/2022 which indicated severe obstructive sleep apnea with an AHI of 82.5/h, O2 nadir 84%, snoring was in the mild to moderate range, at times louder.  Time below or at 88% saturation was over 30 minutes for the night.  She was advised to proceed with home AutoPap therapy.  Her set up date was 09/09/2022.  She has a ResMed air sense 10 AutoSet machine.  Her DME company is [employer].   Today, 11/09/2022 (all dictated new, as well as above notes, some dictation done in note pad or Word, outside of chart, may appear as copied):    I reviewed her AutoPap compliance data from 10/09/2022 through 11/07/2022, which is a total of 30 days, during which time she used her machine every night with percent use days greater than 4 hours at 100%, indicating superb compliance, average usage of 8 hours and 39 minutes, residual AHI at goal at 0.5/h, average pressure for the 95th percentile at 10.1 cm with a range of 5 to 11 cm with EPR of 3.  Leak acceptable with the 95th percentile at 10 L/min.  She reports having adjusted fairly well to AutoPap therapy.  She has benefited from treatment, feels less sleepy during the day but still  has some residual tiredness, Epworth sleepiness score is 9 out of 24 today.  She is motivated to continue with treatment, uses a ResMed N20 nasal mask.  She hydrates well with water.  REVIEW OF SYSTEMS: Out of a complete 14 system review of symptoms, the patient complains only of the following symptoms, and all other reviewed systems are negative.  See HPI  ALLERGIES: Allergies  Allergen Reactions   Buprenorphine Hcl Nausea And Vomiting   Codeine Nausea And Vomiting    But tolerates Tramadol    Morphine Nausea And Vomiting   Procaine     Novacaine-Shakes    HOME MEDICATIONS: Outpatient  Medications Prior to Visit  Medication Sig Dispense Refill   albuterol (VENTOLIN HFA) 108 (90 Base) MCG/ACT inhaler Inhale 2 puffs into the lungs every 6 (six) hours as needed for wheezing or shortness of breath.     apixaban (ELIQUIS) 5 MG TABS tablet Take 1 tablet (5 mg total) by mouth 2 (two) times daily. 60 tablet 0   busPIRone (BUSPAR) 10 MG tablet Take 10 mg by mouth 2 (two) times daily.      carbidopa-levodopa (SINEMET IR) 25-100 MG tablet TAKE 2 TABLETS BY MOUTH AT 9 A.M., THEN 2 TABLETS AT 1 P.M., THEN 1 TABLET AT 5 P.M. AS DIRECTED 450 tablet 0   cholecalciferol (VITAMIN D) 25 MCG (1000 UNIT) tablet Take 1,000 Units by mouth daily.     ciprofloxacin (CIPRO) 250 MG tablet Take 1 tablet (250 mg total) by mouth 2 (two) times daily. 6 tablet 0   donepezil (ARICEPT) 10 MG tablet Take 10 mg by mouth in the morning.     escitalopram (LEXAPRO) 10 MG tablet Take 10 mg by mouth daily.     esomeprazole (NEXIUM) 40 MG capsule Take 40 mg by mouth 2 (two) times daily before a meal.      furosemide (LASIX) 40 MG tablet Take 40 mg by mouth daily.     loratadine (CLARITIN) 10 MG tablet Take 10 mg by mouth at bedtime.     mirabegron ER (MYRBETRIQ) 50 MG TB24 tablet Take 50 mg by mouth at bedtime.      nitrofurantoin, macrocrystal-monohydrate, (MACROBID) 100 MG capsule Take 100 mg by mouth in the morning.     NON FORMULARY Pt uses a cpap nightly     olmesartan (BENICAR) 20 MG tablet Take 20 mg by mouth in the morning and at bedtime.      propranolol (INDERAL) 10 MG tablet TAKE 1 TABLET BY MOUTH 2 TIMES A DAY 180 tablet 0   saccharomyces boulardii (FLORASTOR) 250 MG capsule Take 250 mg by mouth 2 (two) times daily.     tirzepatide (ZEPBOUND) 2.5 MG/0.5ML Pen Inject 2.5 mg into the skin once a week. 2 mL 0   tirzepatide (ZEPBOUND) 5 MG/0.5ML Pen Inject 5 mg into the skin once a week. 2 mL 5   TOVIAZ 8 MG TB24 tablet Take 8 mg by mouth at bedtime.      vitamin B-12 (CYANOCOBALAMIN) 1000 MCG tablet Take  1,000 mcg by mouth daily.     No facility-administered medications prior to visit.    PAST MEDICAL HISTORY: Past Medical History:  Diagnosis Date   Arthritis    arthritis in joints and back   Benign familial tremor    Clostridium difficile infection    DDD (degenerative disc disease), lumbar    Diverticulosis    DVT (deep venous thrombosis) (HCC)    left leg   Dyspnea  Gallstones    GERD (gastroesophageal reflux disease)    Hypertension    Neuromuscular disorder (HCC)    Parkinson's   Osteoporosis    Wears glasses     PAST SURGICAL HISTORY: Past Surgical History:  Procedure Laterality Date   ABDOMINAL HYSTERECTOMY  1983   arterial vascular fistula  2004   coiled at [facility]   BLADDER REPAIR  [phone]   CARPAL TUNNEL RELEASE  right   CHOLECYSTECTOMY N/A 03/29/2013   Procedure: LAPAROSCOPIC CHOLECYSTECTOMY WITH INTRAOPERATIVE CHOLANGIOGRAM;  Surgeon: Currie Paris, MD;  Location: [facility];  Service: General;  Laterality: N/A;   CYSTOSCOPY WITH INJECTION N/A 11/01/2023   Procedure: CYSTOSCOPY WITH INJECTION BULKAMID INJECTION;  Surgeon: Alfredo Martinez, MD;  Location: [facility];  Service: Urology;  Laterality: N/A;  30 MINS FOR CASE   EUS N/A 03/09/2013   Procedure: UPPER ENDOSCOPIC ULTRASOUND (EUS) LINEAR;  Surgeon: Theda Belfast, MD;  Location: [facility];  Service: Endoscopy;  Laterality: N/A;   LUMBAR LAMINECTOMY/DECOMPRESSION MICRODISCECTOMY  11/30/2012   Procedure: LUMBAR LAMINECTOMY/DECOMPRESSION MICRODISCECTOMY 2 LEVELS;  Surgeon: Drucilla Schmidt, MD;  Location: [facility];  Service: Orthopedics;  Laterality: N/A;  DECOMPRESSION LAMINECTOMY L4-L5, L5-S1 CENTRAL    ROTATOR CUFF REPAIR Bilateral 2004   two on right, one on left   TOTAL KNEE ARTHROPLASTY Left 06/06/2020   Procedure: LEFT TOTAL KNEE ARTHROPLASTY;  Surgeon: Kathryne Hitch, MD;  Location: [facility];  Service: Orthopedics;  Laterality: Left;   TRIGGER FINGER RELEASE Right     hand     FAMILY HISTORY: Family History  Problem Relation Age of Onset   Other Mother        sarcoma   Other Father        broken hip   Dementia Father    Pneumonia Father    Sleep apnea Neg Hx     SOCIAL HISTORY: Social History   Socioeconomic History   Marital status: Widowed    Spouse name: Richard   Number of children: 2   Years of education: 19   Highest education level: Not on file  Occupational History   Occupation: Network engineer    Comment: partially retired  Tobacco Use   Smoking status: Former    Current packs/day: 0.00    Types: Cigarettes    Quit date: 07/11/1970    Years since quitting: 53.5   Smokeless tobacco: Never  Vaping Use   Vaping status: Never Used  Substance and Sexual Activity   Alcohol use: Yes    Alcohol/week: 0.0 standard drinks of alcohol    Comment: wine occasionally   Drug use: No   Sexual activity: Not Currently  Other Topics Concern   Not on file  Social History Narrative   Patient is right handed, resides Alone.   1 cup of coffee a day    Social Drivers of Corporate investment banker Strain: Not on file  Food Insecurity: Not on file  Transportation Needs: Not on file  Physical Activity: Not on file  Stress: Not on file  Social Connections: Not on file  Intimate Partner Violence: Not on file    PHYSICAL EXAM  Vitals:   01/11/24 1310  BP: 138/74  Pulse: 73  Weight: 232 lb (105.2 kg)  Height: 5' (1.524 m)   Body mass index is 45.31 kg/m.  Generalized: Well developed, in no acute distress  Neurological examination  Mentation: Alert oriented to time, place, history taking. Follows all commands speech and language  fluent Cranial nerve II-XII: Pupils were equal round reactive to light.  Motor: Moves all extremities.  Resting tremor noted to the upper extremities. Gait and station: Gait is assisted by rolling walker   DIAGNOSTIC DATA (LABS, IMAGING, TESTING) - I reviewed patient records, labs, notes, testing and  imaging myself where available.  Lab Results  Component Value Date   WBC 9.6 10/28/2023   HGB 12.2 10/28/2023   HCT 37.4 10/28/2023   MCV 95.7 10/28/2023   PLT 224 10/28/2023      Component Value Date/Time   NA 134 (L) 10/28/2023 1401   K 4.0 10/28/2023 1401   CL 99 10/28/2023 1401   CO2 26 10/28/2023 1401   GLUCOSE 89 10/28/2023 1401   BUN 15 10/28/2023 1401   CREATININE 0.71 10/28/2023 1401   CALCIUM 8.5 (L) 10/28/2023 1401   PROT 7.2 06/19/2022 2038   ALBUMIN 3.7 06/19/2022 2038   AST 13 (L) 06/19/2022 2038   ALT 7 06/19/2022 2038   ALKPHOS 59 06/19/2022 2038   BILITOT 0.7 06/19/2022 2038   GFRNONAA >60 10/28/2023 1401   GFRAA >60 06/07/2020 0332   No results found for: "CHOL", "HDL", "LDLCALC", "LDLDIRECT", "TRIG", "CHOLHDL" No results found for: "HGBA1C" No results found for: "VITAMINB12" Lab Results  Component Value Date   TSH 1.749 05/18/2018    Margie Ege, AGNP-C, DNP 01/11/2024, 1:23 PM [facility] [address] [place], [place] [address] [phone]

## 2024-01-11 ENCOUNTER — Ambulatory Visit (INDEPENDENT_AMBULATORY_CARE_PROVIDER_SITE_OTHER): Payer: Medicare HMO | Admitting: Neurology

## 2024-01-11 ENCOUNTER — Encounter: Payer: Self-pay | Admitting: Neurology

## 2024-01-11 VITALS — BP 138/74 | HR 73 | Ht 60.0 in | Wt 232.0 lb

## 2024-01-11 DIAGNOSIS — G4733 Obstructive sleep apnea (adult) (pediatric): Secondary | ICD-10-CM | POA: Diagnosis not present

## 2024-01-11 NOTE — Patient Instructions (Signed)
I will pull a CPAP download in about 1 month and contact you.  We will check all of the mask fit and your apnea spells.  Please continue nightly usage for minimum of 4 hours.

## 2024-01-12 DIAGNOSIS — G20A1 Parkinson's disease without dyskinesia, without mention of fluctuations: Secondary | ICD-10-CM | POA: Diagnosis not present

## 2024-01-12 DIAGNOSIS — R2681 Unsteadiness on feet: Secondary | ICD-10-CM | POA: Diagnosis not present

## 2024-01-13 ENCOUNTER — Other Ambulatory Visit (HOSPITAL_BASED_OUTPATIENT_CLINIC_OR_DEPARTMENT_OTHER): Payer: Self-pay

## 2024-01-14 ENCOUNTER — Other Ambulatory Visit (HOSPITAL_BASED_OUTPATIENT_CLINIC_OR_DEPARTMENT_OTHER): Payer: Self-pay

## 2024-01-15 ENCOUNTER — Other Ambulatory Visit (HOSPITAL_BASED_OUTPATIENT_CLINIC_OR_DEPARTMENT_OTHER): Payer: Self-pay

## 2024-01-16 DIAGNOSIS — M5416 Radiculopathy, lumbar region: Secondary | ICD-10-CM | POA: Diagnosis not present

## 2024-01-16 DIAGNOSIS — M5127 Other intervertebral disc displacement, lumbosacral region: Secondary | ICD-10-CM | POA: Diagnosis not present

## 2024-01-16 DIAGNOSIS — M810 Age-related osteoporosis without current pathological fracture: Secondary | ICD-10-CM | POA: Diagnosis not present

## 2024-01-16 DIAGNOSIS — G20A1 Parkinson's disease without dyskinesia, without mention of fluctuations: Secondary | ICD-10-CM | POA: Diagnosis not present

## 2024-01-16 DIAGNOSIS — M4800 Spinal stenosis, site unspecified: Secondary | ICD-10-CM | POA: Diagnosis not present

## 2024-01-16 DIAGNOSIS — M546 Pain in thoracic spine: Secondary | ICD-10-CM | POA: Diagnosis not present

## 2024-01-16 DIAGNOSIS — R2681 Unsteadiness on feet: Secondary | ICD-10-CM | POA: Diagnosis not present

## 2024-01-19 DIAGNOSIS — G20A1 Parkinson's disease without dyskinesia, without mention of fluctuations: Secondary | ICD-10-CM | POA: Diagnosis not present

## 2024-01-19 DIAGNOSIS — R2681 Unsteadiness on feet: Secondary | ICD-10-CM | POA: Diagnosis not present

## 2024-01-21 ENCOUNTER — Other Ambulatory Visit (HOSPITAL_BASED_OUTPATIENT_CLINIC_OR_DEPARTMENT_OTHER): Payer: Self-pay

## 2024-01-23 DIAGNOSIS — R2681 Unsteadiness on feet: Secondary | ICD-10-CM | POA: Diagnosis not present

## 2024-01-23 DIAGNOSIS — G20A1 Parkinson's disease without dyskinesia, without mention of fluctuations: Secondary | ICD-10-CM | POA: Diagnosis not present

## 2024-01-26 ENCOUNTER — Other Ambulatory Visit (HOSPITAL_BASED_OUTPATIENT_CLINIC_OR_DEPARTMENT_OTHER): Payer: Self-pay

## 2024-01-30 DIAGNOSIS — G20A1 Parkinson's disease without dyskinesia, without mention of fluctuations: Secondary | ICD-10-CM | POA: Diagnosis not present

## 2024-01-30 DIAGNOSIS — R2681 Unsteadiness on feet: Secondary | ICD-10-CM | POA: Diagnosis not present

## 2024-02-02 DIAGNOSIS — G20A1 Parkinson's disease without dyskinesia, without mention of fluctuations: Secondary | ICD-10-CM | POA: Diagnosis not present

## 2024-02-02 DIAGNOSIS — R2681 Unsteadiness on feet: Secondary | ICD-10-CM | POA: Diagnosis not present

## 2024-02-07 DIAGNOSIS — R2681 Unsteadiness on feet: Secondary | ICD-10-CM | POA: Diagnosis not present

## 2024-02-07 DIAGNOSIS — G20A1 Parkinson's disease without dyskinesia, without mention of fluctuations: Secondary | ICD-10-CM | POA: Diagnosis not present

## 2024-02-08 ENCOUNTER — Telehealth: Payer: Self-pay | Admitting: Neurology

## 2024-02-08 DIAGNOSIS — G4733 Obstructive sleep apnea (adult) (pediatric): Secondary | ICD-10-CM

## 2024-02-08 NOTE — Telephone Encounter (Signed)
 Please call patient to check on CPAP.  At last visit was going to try to loosen her mask to help her sleep better.  Her data looks overall good, her mask is leaking a little bit more at 34 (was 15), AHI remains well treated at 0.8.  If she is sleeping well with current mask set up okay to continue.  If she would like a mask refit I can order that as well.

## 2024-02-08 NOTE — Telephone Encounter (Signed)
 LVM for pt per NP Margie Ege to ask about pt's mask for her CPAP Machine.

## 2024-02-09 NOTE — Telephone Encounter (Signed)
 LVM asking pt to call back to discuss Mask.

## 2024-02-13 DIAGNOSIS — R2681 Unsteadiness on feet: Secondary | ICD-10-CM | POA: Diagnosis not present

## 2024-02-13 DIAGNOSIS — G20A1 Parkinson's disease without dyskinesia, without mention of fluctuations: Secondary | ICD-10-CM | POA: Diagnosis not present

## 2024-02-14 DIAGNOSIS — G20A1 Parkinson's disease without dyskinesia, without mention of fluctuations: Secondary | ICD-10-CM | POA: Diagnosis not present

## 2024-02-14 DIAGNOSIS — R2681 Unsteadiness on feet: Secondary | ICD-10-CM | POA: Diagnosis not present

## 2024-02-14 NOTE — Addendum Note (Signed)
 Addended by: Arther Abbott on: 02/14/2024 01:12 PM   Modules accepted: Orders

## 2024-02-14 NOTE — Telephone Encounter (Addendum)
 Called pt at [phone]. Relayed SS,NP message. She did loosen mask but is having a lot of air leaks. Agreeable to mask refit via [facility]. Order placed and community message sent to [facility] that order placed. She will be on look out for call from [facility] in next wk.

## 2024-02-16 ENCOUNTER — Ambulatory Visit: Payer: Medicare HMO | Admitting: Orthopaedic Surgery

## 2024-03-02 DIAGNOSIS — N3946 Mixed incontinence: Secondary | ICD-10-CM | POA: Diagnosis not present

## 2024-03-14 ENCOUNTER — Other Ambulatory Visit: Payer: Self-pay | Admitting: Neurology

## 2024-03-14 DIAGNOSIS — G20A1 Parkinson's disease without dyskinesia, without mention of fluctuations: Secondary | ICD-10-CM

## 2024-03-23 ENCOUNTER — Other Ambulatory Visit: Payer: Self-pay | Admitting: Neurology

## 2024-03-23 DIAGNOSIS — G20A1 Parkinson's disease without dyskinesia, without mention of fluctuations: Secondary | ICD-10-CM

## 2024-03-28 NOTE — Progress Notes (Unsigned)
 Assessment/Plan:   1.  ET/PD  -Continue carbidopa/levodopa 25/100 2 tablets at 9am/2 tablets at 1pm/1 tablet at 5pm.  Discussed appropriate timing of medication again this visit  -On propranolol, 10 mg twice per day.  Higher dosages with more fatigue  -DaTscan was abnormal in October, 2021.  2.  Recurrent DVT/PE  -On Eliquis 3.  RBD  -Hold Tylenol PM.  Discussed this last visit as well.  -lexapro may contribute but her dose has been decreased in the past.  - She is following with sleep medicine at [facility] neurology.  4.  Dysphagia  -MBE in October, 2022 with minimal oropharyngeal dysphagia.  Regular diet with thin liquid recommended. 5.  Severe sleep apnea, AHI 82.5  -Now on CPAP and following with [facility] 6.  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 7.  Low back pain  -Following with orthopedics 8.  EDS  -don't think related to Parkinsons Disease as its EDS and not fatigue  -f/u Dr. Frances Furbish Subjective:   [name] was seen today in follow up for ET/Parkinsons disease.  My previous records were reviewed prior to todays visit as well as outside records available to me.  She is accompanied by her son who supplements the history.  Tremor continues to be bothersome.  She is faithful with her levodopa and propranolol.  She has not had falls.  She saw [facility] neurology sleep, nurse practitioner, January 29.  She had good compliance with her CPAP.  RBD was not addressed.  She did try Zepbound but did not tolerate it because of GI side effects.  Current prescribed movement disorder medications: Carbidopa/levodopa 25/100, 2 tablets at 9am/2 tablets at 1pm/1 tablet at 5pm  Propranolol 10 mg twice per day  Aricept, 10 mg daily (prescribed by primary care)  PREVIOUS MEDICATIONS: Topiramate, memory change; propranolol (higher dosages with more fatigue)  ALLERGIES:    Allergies  Allergen Reactions   Buprenorphine Hcl Nausea And Vomiting   Codeine Nausea And Vomiting    But tolerates Tramadol    Morphine Nausea And Vomiting   Procaine     Novacaine-Shakes    CURRENT MEDICATIONS:  Outpatient Encounter Medications as of 03/29/2024  Medication Sig   albuterol (VENTOLIN HFA) 108 (90 Base) MCG/ACT inhaler Inhale 2 puffs into the lungs every 6 (six) hours as needed for wheezing or shortness of breath.   apixaban (ELIQUIS) 5 MG TABS tablet Take 1 tablet (5 mg total) by mouth 2 (two) times daily.   busPIRone (BUSPAR) 10 MG tablet Take 10 mg by mouth 2 (two) times daily.    carbidopa-levodopa (SINEMET IR) 25-100 MG tablet 2 tablets at 9am/2 tablets at 1pm/1 tablet at 5pm.   cholecalciferol (VITAMIN D) 25 MCG (1000 UNIT) tablet Take 1,000 Units by mouth daily.   ciprofloxacin (CIPRO) 250 MG tablet Take 1 tablet (250 mg total) by mouth 2 (two) times daily.   donepezil (ARICEPT) 10 MG tablet Take 10 mg by mouth in the morning.   escitalopram (LEXAPRO) 10 MG tablet Take 10 mg by mouth daily.   esomeprazole (NEXIUM) 40 MG capsule Take 40 mg by mouth 2 (two) times daily before a meal.    furosemide (LASIX) 40 MG tablet Take 40 mg by mouth daily.   loratadine (CLARITIN) 10 MG tablet Take 10 mg by mouth at bedtime.   mirabegron ER (MYRBETRIQ) 50 MG TB24 tablet Take 50  mg by mouth at bedtime.    nitrofurantoin, macrocrystal-monohydrate, (MACROBID) 100 MG capsule Take 100 mg by mouth in the morning.   NON FORMULARY Pt uses a cpap nightly   olmesartan (BENICAR) 20 MG tablet Take 20 mg by mouth in the morning and at bedtime.    propranolol (INDERAL) 10 MG tablet TAKE 1 TABLET BY MOUTH 2 TIMES A DAY   saccharomyces boulardii (FLORASTOR) 250 MG capsule Take 250 mg by mouth 2 (two) times daily.   tirzepatide (ZEPBOUND) 2.5 MG/0.5ML Pen Inject 2.5 mg into the skin once a week.   tirzepatide (ZEPBOUND) 5 MG/0.5ML Pen Inject 5 mg into the skin once a week.   TOVIAZ 8 MG  TB24 tablet Take 8 mg by mouth at bedtime.    vitamin B-12 (CYANOCOBALAMIN) 1000 MCG tablet Take 1,000 mcg by mouth daily.   [DISCONTINUED] carvedilol (COREG) 6.25 MG tablet    No facility-administered encounter medications on file as of 03/29/2024.    Objective:   PHYSICAL EXAMINATION:    VITALS:   There were no vitals filed for this visit.    GEN:  The patient appears stated age and is in NAD. HEENT:  Normocephalic, atraumatic.  The mucous membranes are moist.  CV:  RRR Lungs:  CTAB Neck:  no bruits  Neurological examination:  Orientation: The patient is alert and oriented x3. Cranial nerves: There is good facial symmetry without facial hypomimia. The speech is fluent and clear. Soft palate rises symmetrically and there is no tongue deviation. Hearing is intact to conversational tone. Sensation: Sensation is intact to light touch throughout Motor: Strength is at least antigravity x4.  Movement examination: Tone: There is mild increased tone in the RUE Abnormal movements: there is intermittent RUE>LUE rest tremor Coordination:  There is no decremation, with any form of RAMS, including alternating supination and pronation of the forearm, hand opening and closing, finger taps, heel taps and toe taps. Gait and Station: The patient has pushes off to arise.  She ambulates slow with the walker and is a bit antalgic  I have reviewed and interpreted the following labs independently    Chemistry      Component Value Date/Time   NA 134 (L) 10/28/2023 1401   K 4.0 10/28/2023 1401   CL 99 10/28/2023 1401   CO2 26 10/28/2023 1401   BUN 15 10/28/2023 1401   CREATININE 0.71 10/28/2023 1401      Component Value Date/Time   CALCIUM 8.5 (L) 10/28/2023 1401   ALKPHOS 59 06/19/2022 2038   AST 13 (L) 06/19/2022 2038   ALT 7 06/19/2022 2038   BILITOT 0.7 06/19/2022 2038       Lab Results  Component Value Date   WBC 9.6 10/28/2023   HGB 12.2 10/28/2023   HCT 37.4 10/28/2023    MCV 95.7 10/28/2023   PLT 224 10/28/2023    Lab Results  Component Value Date   TSH 1.749 05/18/2018   No results found for: "VITAMINB12"   Total time spent on today's visit was *** minutes, including both face-to-face time and nonface-to-face time.  Time included that spent on review of records (prior notes available to me/labs/imaging if pertinent), discussing treatment and goals, answering patient's questions and coordinating care.  Cc:  Cleatis Polka., MD

## 2024-03-29 ENCOUNTER — Ambulatory Visit: Payer: Medicare HMO | Admitting: Neurology

## 2024-03-29 VITALS — BP 126/84 | HR 64 | Wt 221.6 lb

## 2024-03-29 DIAGNOSIS — N3946 Mixed incontinence: Secondary | ICD-10-CM | POA: Diagnosis not present

## 2024-03-29 DIAGNOSIS — G4752 REM sleep behavior disorder: Secondary | ICD-10-CM | POA: Diagnosis not present

## 2024-03-29 DIAGNOSIS — R350 Frequency of micturition: Secondary | ICD-10-CM | POA: Diagnosis not present

## 2024-03-29 DIAGNOSIS — G20A1 Parkinson's disease without dyskinesia, without mention of fluctuations: Secondary | ICD-10-CM

## 2024-03-29 MED ORDER — CLONAZEPAM 0.5 MG PO TABS: 0.2500 mg | ORAL_TABLET | Freq: Every day | ORAL | 1 refills | Status: AC

## 2024-03-29 NOTE — Patient Instructions (Signed)
 Take klonopin, 0.5 mg, 1/2 tablet at bedtime.  Use your walker when you get up at night and let me know if you get too sleepy in the day with the new medication.

## 2024-04-04 ENCOUNTER — Other Ambulatory Visit: Payer: Self-pay | Admitting: Urology

## 2024-04-05 ENCOUNTER — Other Ambulatory Visit (HOSPITAL_COMMUNITY): Payer: Self-pay

## 2024-04-09 ENCOUNTER — Encounter (HOSPITAL_COMMUNITY)
Admission: RE | Admit: 2024-04-09 | Discharge: 2024-04-09 | Disposition: A | Discharge status: Home / Self Care | Source: Ambulatory Visit | Attending: Internal Medicine | Admitting: Internal Medicine

## 2024-04-09 DIAGNOSIS — M810 Age-related osteoporosis without current pathological fracture: Secondary | ICD-10-CM | POA: Insufficient documentation

## 2024-04-09 MED ORDER — ZOLEDRONIC ACID 5 MG/100ML IV SOLN
INTRAVENOUS | Status: AC
  Administered 2024-04-09: 5 mg via INTRAVENOUS
  Filled 2024-04-09: qty 100

## 2024-04-09 MED ORDER — ZOLEDRONIC ACID 5 MG/100ML IV SOLN: 5.0000 mg | Freq: Once | INTRAVENOUS | Status: AC

## 2024-04-10 DIAGNOSIS — Z113 Encounter for screening for infections with a predominantly sexual mode of transmission: Secondary | ICD-10-CM | POA: Diagnosis not present

## 2024-05-09 ENCOUNTER — Encounter (HOSPITAL_COMMUNITY): Payer: Self-pay

## 2024-05-10 ENCOUNTER — Encounter (HOSPITAL_COMMUNITY): Payer: Self-pay | Admitting: Urology

## 2024-05-10 NOTE — Progress Notes (Signed)
 Spoke w/ via phone for pre-op interview--- [name] and patient caregiver [employer] needs dos----  BMP per anesthesia.       Lab results------Current EKG in Epic dated 10/28/23. COVID test -----patient states asymptomatic no test needed Arrive at -------1000 NPO after MN NO Solid Food.  Clear liquids from MN until---0900 Pre-Surgery Ensure or G2:  Med rec completed Medications to take morning of surgery -----Carbidopa Kandace Organ, Buspar , Aricept , Lexapro , Nexium and Propranolol .  Diabetic medication -----  GLP1 agonist last dose: GLP1 instructions:  Patient instructed no nail polish to be worn day of surgery Patient instructed to bring photo id and insurance card day of surgery Patient aware to have Driver (ride ) / caregiver    for 24 hours after surgery - Patient resides at [facility] independent living, [place] will transport pt. Son Linton Richter will be at hospital. Patient Special Instructions ----- Shower with antibacterial soap. Hold Eliquis  per surgeon instructions for 2 days prior to surgery. Pre-Op special Instructions -----  Patient uses walker to ambulate, sometimes uses wheelchair.   Patient is marked as difficult intubation, pt states she was told she has a small throat this was after a procedure a long time ago. Last procedure no problems. MDA review by Dr Annabell Key, stated it was ok to proceed.  Patient verbalized understanding of instructions that were given at this phone interview. Patient denies chest pain, sob, fever, cough at the interview.

## 2024-05-13 NOTE — H&P (Signed)
 I reviewed my last note November 23, 2022.  last visit  Patient in the last many months has high-volume urge incontinence soaking 8 pads a day. On this. She has little bit of bedwetting. It is worse when she takes a diuretic and she is actually had weight gain due to fluid retention. She has a walker. In the past has been on trimethoprim and daily Macrodantin . She said C. difficile. She has failed percutaneous tibial nerve stimulation. She has been on Toviaz  with Myrbetriq  and when we suppress infection she was doing reasonly well. I talked about reasonable treatment goals in the past. I did not treat her with Botox in the past. I do not think ever brought up InterStim.  Looked infected and I sent for culture  Patient is on Myrbetriq  and Toviaz  but she does not know if she is on Macrobid .  I renewed both overactive bladder medications and sent in Macrobid . I also sent in ciprofloxacin  250 mg twice a day for 7 days. She has never tried Gemtesa or Botox and we will talk about this next time after I perform cystoscopy in 5 weeks. I am hoping she down regulates but she has a lot of mobility and medical issues that are compounding the problem. She still is on CPAP   She came back earlier than her appointment in the last culture was positive. Urine looked normal today but sent for culture. She never took Macrodantin . She says she is dramatically better since she had the antibiotic. I called in Macrodantin  100 mg 3 x 11 she will come back at the end of month as scheduled for cystoscopy. She will stay on the Toviaz  and Myrbetriq . Overall much improved   Patient is down to 3 pads a day which is a big improvement. She really had almost total incontinence before. Clinically not infected. I like to keep her on the Macrodantin  Toviaz  and Myrbetriq  and reassess her in 6 months  Cystoscopy normal with few white flecks   Last 2 cultures negative. Infection free but sent urine for culture. Incontinence persisting. No  further therapy. All 3 prescriptions renewed and I will see in 1 year   Patient continues use 5 pads a day and is using a double pad system. Clinically not infected. Urine sent for culture. Still has urge incontinence. Importantly she leaks without awareness. Urine looked normal  I reviewed many notes from the past I do not think she is ever had urodynamics. She is on CPAP and Eliquis . She has failed percutaneous tibial nerve stimulation  She can leak a tremendous amount through her close playing cards etc. Yes she is on a blood thinner but organ to go ahead and get urodynamics pelvic exam and cystoscopy and consider Botox and or InterStim recognizing medical comorbidities   Today  Frequency stable. Incontinence stable. Last culture negative  On Macrodantin  Toviaz  and Myrbetriq   During urodynamics patient did not void and was catheterized 450 mL. Maxim bladder capacity was 573 mL. Bladder was stable. Cough leak point pressure at 400 mL was 9 cm of water  with moderate leakage. Valsalva leak point pressure of 25 cmH2O with moderate leakage. At 600 mL her cough leak point pressures 22 cmH2O with moderate leakage. Valsalva leak point pressures 36 cmH2O with moderate to severe leakage. During voiding she did a lot of straining. She voided 17 mL with a maximal flow of 3 mL/s. Maxim voiding pressure 8 cm of water . Residual was 589 mL. Bladder neck distended approximately a centimeter but  she did have a cystocele noted. The details of the urodynamics are signed and dictated   On repeat history. She leaks without awareness in a chair and also a lot when she gets out of a chair. Sometimes she has little bit of bedwetting. 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 When asked when she leaks with coughing sneezing she did not note because she leaks so much  Still on Toviaz  Myrbetriq  and Macrodantin  and clinically not infected  On examination vaginal vault is quite deep based on obesity and  decreased mobility. Very well supported bladder neck. Initially negative cough test. After cystoscopy she had a positive cough test. When I would use a speculum with downward pressure she was started to leak. It may have been triggering or causing more urethral insufficiency. There is no question she had moderate severe stress incontinence on pelvic examination in addition to this finding  Patient underwent flexible cystoscopy. Bladder mucosa and trigone were normal. Urethra length and lumen normal. Few white flecks in urine and urine sent for culture   Patient understands she likely has a combination of an overactive bladder as well as significant urethral insufficiency with stress incontinence and leakage with awareness. She has a lot of medical comorbidities with functional issues. I thought the best neck step would be to do bulking agent therapy under anesthesia. Pros cons risk described in detail. Retreatment rates discussed. Rare risk of erosion and pain and sequelae discussed. She will need medical clearance and stopping of blood thinners she also has Parkinson   History of drawn. She understands I recommend this over Botox and peripheral nerve evaluation at this stage. I did lower success rates and I think we need to have reasonable treatment goals. She understands the complexity of the issue and that her overactivity certainly can persist. She will speak to pharmacy about pad types. I am hopeful that we can help her to a moderate degree or greater call if culture positive   Patient has significant intrinsic sphincter deficiency   Today  I last saw the patient in October 2024. She has a lot of medical comorbidities. It appears she had failed Gemtesa. She had a bulking agent treatment in November 2024. 1-1/3 syringes was used. Technically excellent coaptation. She was doing well when she was seen by nurse practitioner following the procedure. She was seen in March by extender. She had improved  incontinence but still leaking. She has significant dry mouth and her Toviaz  was stopped. She was concerned about taking more medications with polypharmacy. She was told to continue the Myrbetriq . She has failed percutaneous tibial nerve stimulation.   I spent many minutes with her son and patient today. I went over the pathophysiology of her incontinence. She has overactivity. She has urethral insufficiency. She should not have Botox or InterStim in my opinion. We are getting near the end of her treatment pathway. We talked with the bulking agent in full template again and walked her through the past many months and treatment. We talked about retreatment rates. Clinically not infected but call if urine culture positive. Organ to go ahead with another bulking agent and will need medical clearance under anesthesia. She is on blood thinners. We will see how she does for her mixed incontinence. Retreatment rate and commonsense approach discussed. She still soaks several pads a day and double pads. She really does not have much bedwetting. I was teasing her when she asked me why she leaked. I think we  need to have reasonable treatment goals and she understands that the bulking treatment even last time was done because of the severity of her incontinence and her personal wishes     ALLERGIES: Morphine Derivatives    MEDICATIONS: Myrbetriq  50 MG Tablet Extended Release 24 Hour 1 tablet PO Daily  Nitrofurantoin  Monohyd Macro 100 MG Capsule 1 capsule PO Daily  Bupropion Hcl  Claritin  10 MG Capsule  CPAP  Cymbalta 30 MG Capsule Delayed Release Particles Oral  Eliquis   Florastor  Lexapro  20 MG Tablet  NexIUM 40 MG Oral Capsule Delayed Release Oral  Primidone  50 MG Tablet Oral  Propranolol  HCl 20 MG Tablet  Vitamin D3  Zepbound  1 PO Daily     GU PSH: Complex cystometrogram, w/ void pressure and urethral pressure profile studies, any technique - 10/06/2023 Complex Uroflow - 10/06/2023 Cystoscopy -  10/13/2023, 01/05/2023 Emg surf Electrd - 10/06/2023 Hysterectomy Unilat SO - 2009 Inject For cystogram - 10/06/2023 Intrabd voidng Press - 10/06/2023       PSH Notes: Rotator Cuff Repair, Bladder Surgery, Hysterectomy, Brain Surgery, Shoulder Surgery, Hand Surgery   NON-GU PSH: Neuroeltrd Stim Post Tibial - 2021, 2021, 2019, 2019, 2019, 2019, 2019, 2019, 2019, 2019, 2018, 2018, 2018, 2018, 2018, 2018 Visit Complexity (formerly GPC1X) - 03/02/2024, 10/13/2023, 09/15/2023, 07/05/2023     GU PMH: Mixed incontinence - 03/02/2024, - 11/17/2023, - 10/13/2023, - 10/06/2023, - 09/15/2023, - 07/05/2023, - 01/05/2023, - 12/15/2022, - 11/23/2022, - 11/20/2021, Urge and stress incontinence, - 2017 Chronic cystitis (w/o hematuria) - 10/13/2023, - 01/05/2023, - 11/20/2021, - 2021, Chronic cystitis, - 2017 Urinary Frequency - 10/06/2023, - 2021, Increased urinary frequency, - 2016 Urinary Tract Inf, Unspec site - 09/15/2023, - 07/05/2023, - 01/05/2023, - 12/15/2022, - 11/23/2022, Urinary tract infection, - 2015 Weak Urinary Stream, Weak urinary stream - 2014    NON-GU PMH: Encounter for general adult medical examination without abnormal findings, Encounter for preventive health examination - 2015 Personal history of other diseases of the circulatory system, History of hypertension - 2015 Personal history of other diseases of the digestive system, History of gastroesophageal reflux (GERD) - 2015 Personal history of other diseases of the musculoskeletal system and connective tissue, History of arthritis - 2015 Muscle wasting and atrophy, not elsewhere classified, multiple sites, Disuse muscle atrophy - 2014 Muscle weakness (generalized), Muscle weakness - 2014 Other lack of coordination, Other lack of coordination - 2014 Other muscle spasm, Muscle spasm - 2014 Enterocolitis due to Clostridium difficile    FAMILY HISTORY: Chronic Lymphoma - Mother Death In The Family Father - Father Death In The Family Mother -  Mother Death of family member - Runs In Family Family Health Status Number - Runs In Family   SOCIAL HISTORY: No Social History     Notes: Number of children, Occupation, Caffeine use, Alcohol  use, Married, Former smoker, Exercise Habits, Self-reliant In Usual Daily Activities, Activities Of Daily Living, Living Independently With Spouse, Marital History - Currently Married, Alcohol  Use, Caffeine Use, Tobacco Use, Occupation:   REVIEW OF SYSTEMS:    GU Review Female:   Patient reports frequent urination, hard to postpone urination, get up at night to urinate, leakage of urine, trouble starting your stream, and have to strain to urinate. Patient denies burning /pain with urination, stream starts and stops, and being pregnant.  Gastrointestinal (Upper):   Patient denies indigestion/ heartburn, nausea, and vomiting.  Gastrointestinal (Lower):   Patient denies diarrhea and constipation.  Constitutional:   Patient denies fever, night sweats, weight loss, and  fatigue.  Skin:   Patient denies skin rash/ lesion and itching.  Eyes:   Patient denies blurred vision and double vision.  Ears/ Nose/ Throat:   Patient denies sore throat and sinus problems.  Hematologic/Lymphatic:   Patient denies swollen glands and easy bruising.  Cardiovascular:   Patient denies leg swelling and chest pains.  Respiratory:   Patient denies cough and shortness of breath.  Endocrine:   Patient denies excessive thirst.  Musculoskeletal:   Patient denies back pain and joint pain.  Neurological:   Patient denies headaches and dizziness.  Psychologic:   Patient denies depression and anxiety.   VITAL SIGNS: None   PAST DATA REVIEW: None   PROCEDURES:          Visit Complexity - G2211          Urinalysis w/Scope Dipstick Dipstick Cont'd Micro  Color: Yellow Bilirubin: Neg mg/dL WBC/hpf: 0 - 5/hpf  Appearance: Clear Ketones: Neg mg/dL RBC/hpf: NS (Not Seen)  Specific Gravity: <=1.005 Blood: Neg ery/uL Bacteria: Mod  (26-50/hpf)  pH: 6.0 Protein: Neg mg/dL Cystals: NS (Not Seen)  Glucose: Neg mg/dL Urobilinogen: 0.2 mg/dL Casts: NS (Not Seen)    Nitrites: Neg Trichomonas: Not Present    Leukocyte Esterase: Trace leu/uL Mucous: Not Present      Epithelial Cells: NS (Not Seen)      Yeast: NS (Not Seen)      Sperm: Not Present    ASSESSMENT:      ICD-10 Details  1 GU:   Mixed incontinence - N39.46   2   Urinary Frequency - R35.0      PLAN:           Orders Labs Urine Culture          Document Letter(s):  Created for Patient: Clinical Summary         Next Appointment:      Next Appointment: 07/05/2024 01:30 PM    Appointment Type: Office Visit Established Patient    Location: [facility] [phone]    Provider: Erman Hayward, M.D.

## 2024-05-15 ENCOUNTER — Encounter (HOSPITAL_COMMUNITY): Payer: Self-pay | Admitting: Urology

## 2024-05-15 ENCOUNTER — Encounter (HOSPITAL_COMMUNITY)
Admission: RE | Disposition: A | Discharge status: Home / Self Care | Payer: Self-pay | Source: Home / Self Care | Attending: Urology

## 2024-05-15 ENCOUNTER — Ambulatory Visit (HOSPITAL_COMMUNITY): Admitting: Anesthesiology

## 2024-05-15 ENCOUNTER — Ambulatory Visit (HOSPITAL_COMMUNITY)
Admission: RE | Admit: 2024-05-15 | Discharge: 2024-05-15 | Disposition: A | Discharge status: Home / Self Care | Attending: Urology | Admitting: Urology

## 2024-05-15 DIAGNOSIS — Z6841 Body Mass Index (BMI) 40.0 and over, adult: Secondary | ICD-10-CM

## 2024-05-15 DIAGNOSIS — G4733 Obstructive sleep apnea (adult) (pediatric): Secondary | ICD-10-CM | POA: Diagnosis not present

## 2024-05-15 DIAGNOSIS — Z86718 Personal history of other venous thrombosis and embolism: Secondary | ICD-10-CM | POA: Diagnosis not present

## 2024-05-15 DIAGNOSIS — N3281 Overactive bladder: Secondary | ICD-10-CM | POA: Insufficient documentation

## 2024-05-15 DIAGNOSIS — N3946 Mixed incontinence: Secondary | ICD-10-CM | POA: Insufficient documentation

## 2024-05-15 DIAGNOSIS — E66813 Obesity, class 3: Secondary | ICD-10-CM

## 2024-05-15 DIAGNOSIS — F32A Depression, unspecified: Secondary | ICD-10-CM | POA: Insufficient documentation

## 2024-05-15 DIAGNOSIS — K219 Gastro-esophageal reflux disease without esophagitis: Secondary | ICD-10-CM | POA: Diagnosis not present

## 2024-05-15 DIAGNOSIS — Z87891 Personal history of nicotine dependence: Secondary | ICD-10-CM | POA: Insufficient documentation

## 2024-05-15 DIAGNOSIS — I10 Essential (primary) hypertension: Secondary | ICD-10-CM | POA: Insufficient documentation

## 2024-05-15 DIAGNOSIS — G4730 Sleep apnea, unspecified: Secondary | ICD-10-CM | POA: Insufficient documentation

## 2024-05-15 DIAGNOSIS — Z87440 Personal history of urinary (tract) infections: Secondary | ICD-10-CM | POA: Diagnosis not present

## 2024-05-15 DIAGNOSIS — Z7901 Long term (current) use of anticoagulants: Secondary | ICD-10-CM | POA: Diagnosis not present

## 2024-05-15 DIAGNOSIS — Z79899 Other long term (current) drug therapy: Secondary | ICD-10-CM | POA: Diagnosis not present

## 2024-05-15 DIAGNOSIS — N3642 Intrinsic sphincter deficiency (ISD): Secondary | ICD-10-CM | POA: Insufficient documentation

## 2024-05-15 DIAGNOSIS — N393 Stress incontinence (female) (male): Secondary | ICD-10-CM | POA: Diagnosis not present

## 2024-05-15 HISTORY — DX: Sleep apnea, unspecified: G47.30

## 2024-05-15 HISTORY — PX: CYSTOSCOPY: SHX5120

## 2024-05-15 HISTORY — PX: INJECTION, BULKING AGENT, URETHRA: SHX7596

## 2024-05-15 LAB — BASIC METABOLIC PANEL WITH GFR
Anion gap: 12 (ref 5–15)
BUN: 13 mg/dL (ref 8–23)
CO2: 22 mmol/L (ref 22–32)
Calcium: 8.5 mg/dL — ABNORMAL LOW (ref 8.9–10.3)
Chloride: 105 mmol/L (ref 98–111)
Creatinine, Ser: 0.64 mg/dL (ref 0.44–1.00)
GFR, Estimated: >60 mL/min (ref >60)
Glucose, Bld: 97 mg/dL (ref 70–99)
Potassium: 3.6 mmol/L (ref 3.5–5.1)
Sodium: 139 mmol/L (ref 135–145)

## 2024-05-15 SURGERY — INJECTION, BULKING AGENT, URETHRA
Anesthesia: Monitor Anesthesia Care | Site: Urethra

## 2024-05-15 MED ORDER — STERILE WATER FOR IRRIGATION IR SOLN
Status: DC | PRN
  Administered 2024-05-15: 3000 mL

## 2024-05-15 MED ORDER — CIPROFLOXACIN IN D5W 400 MG/200ML IV SOLN
INTRAVENOUS | Status: AC
  Filled 2024-05-15: qty 200

## 2024-05-15 MED ORDER — SODIUM CHLORIDE (PF) 0.9 % IJ SOLN
INTRAMUSCULAR | Status: AC
  Filled 2024-05-15: qty 10

## 2024-05-15 MED ORDER — ORAL CARE MOUTH RINSE: 15.0000 mL | Freq: Once | OROMUCOSAL | Status: AC

## 2024-05-15 MED ORDER — PHENYLEPHRINE 80 MCG/ML (10ML) SYRINGE FOR IV PUSH (FOR BLOOD PRESSURE SUPPORT)
PREFILLED_SYRINGE | INTRAVENOUS | Status: AC
  Filled 2024-05-15: qty 10

## 2024-05-15 MED ORDER — CHLORHEXIDINE GLUCONATE 0.12 % MT SOLN
OROMUCOSAL | Status: DC
Start: 2024-05-15 — End: 2024-05-15
  Filled 2024-05-15: qty 15

## 2024-05-15 MED ORDER — CIPROFLOXACIN IN D5W 400 MG/200ML IV SOLN
400.0000 mg | INTRAVENOUS | Status: AC
  Administered 2024-05-15: 400 mg via INTRAVENOUS

## 2024-05-15 MED ORDER — ACETAMINOPHEN 500 MG PO TABS
ORAL_TABLET | ORAL | Status: DC
Start: 2024-05-15 — End: 2024-05-15
  Filled 2024-05-15: qty 2

## 2024-05-15 MED ORDER — FENTANYL CITRATE (PF) 100 MCG/2ML IJ SOLN: 25.0000 ug | INTRAMUSCULAR | Status: DC | PRN

## 2024-05-15 MED ORDER — ONDANSETRON HCL 4 MG/2ML IJ SOLN: 4.0000 mg | Freq: Once | INTRAMUSCULAR | Status: DC | PRN

## 2024-05-15 MED ORDER — LIDOCAINE 2% (20 MG/ML) 5 ML SYRINGE
INTRAMUSCULAR | Status: AC
  Filled 2024-05-15: qty 5

## 2024-05-15 MED ORDER — PROPOFOL 10 MG/ML IV BOLUS
INTRAVENOUS | Status: AC
  Filled 2024-05-15: qty 20

## 2024-05-15 MED ORDER — ONDANSETRON HCL 4 MG/2ML IJ SOLN
INTRAMUSCULAR | Status: AC
Start: 2024-05-15 — End: ?
  Filled 2024-05-15: qty 2

## 2024-05-15 MED ORDER — PROPOFOL 10 MG/ML IV BOLUS
INTRAVENOUS | Status: DC | PRN
  Administered 2024-05-15 (×2): 100 mg via INTRAVENOUS
  Administered 2024-05-15 (×2): 50 mg via INTRAVENOUS

## 2024-05-15 MED ORDER — LACTATED RINGERS IV SOLN: INTRAVENOUS | Status: DC

## 2024-05-15 MED ORDER — LIDOCAINE 2% (20 MG/ML) 5 ML SYRINGE
INTRAMUSCULAR | Status: DC | PRN
  Administered 2024-05-15: 100 mg via INTRAVENOUS

## 2024-05-15 MED ORDER — DEXAMETHASONE SODIUM PHOSPHATE 10 MG/ML IJ SOLN
INTRAMUSCULAR | Status: DC | PRN
  Administered 2024-05-15: 10 mg via INTRAVENOUS

## 2024-05-15 MED ORDER — ACETAMINOPHEN 500 MG PO TABS
1000.0000 mg | ORAL_TABLET | Freq: Once | ORAL | Status: AC
  Administered 2024-05-15: 1000 mg via ORAL

## 2024-05-15 MED ORDER — CHLORHEXIDINE GLUCONATE 0.12 % MT SOLN
15.0000 mL | Freq: Once | OROMUCOSAL | Status: AC
  Administered 2024-05-15: 15 mL via OROMUCOSAL

## 2024-05-15 MED ORDER — ONDANSETRON HCL 4 MG/2ML IJ SOLN
INTRAMUSCULAR | Status: DC | PRN
  Administered 2024-05-15: 4 mg via INTRAVENOUS

## 2024-05-15 SURGICAL SUPPLY — 16 items
BAG URO CATCHER STRL LF (MISCELLANEOUS) ×2 IMPLANT
ELECTRODE REM PT RTRN 9FT ADLT (ELECTROSURGICAL) ×2 IMPLANT
GLOVE BIOGEL M STRL SZ7.5 (GLOVE) ×2 IMPLANT
GOWN STRL REUS W/ TWL XL LVL3 (GOWN DISPOSABLE) ×2 IMPLANT
KIT TURNOVER KIT B (KITS) ×2 IMPLANT
MANIFOLD NEPTUNE II (INSTRUMENTS) ×2 IMPLANT
NDL 18GX1X1/2 (RX/OR ONLY) (NEEDLE) ×2 IMPLANT
NEEDLE 18GX1X1/2 (RX/OR ONLY) (NEEDLE) ×2 IMPLANT
PACK CYSTO (CUSTOM PROCEDURE TRAY) ×2 IMPLANT
PAD ARMBOARD POSITIONER FOAM (MISCELLANEOUS) ×2 IMPLANT
SOL .9 NS 3000ML IRR UROMATIC (IV SOLUTION) ×2 IMPLANT
SOL PREP POV-IOD 4OZ 10% (MISCELLANEOUS) ×2 IMPLANT
SYR CONTROL 10ML LL (SYRINGE) ×2 IMPLANT
SYSTEM URETHRAL BULK BULKAMID (Female Continence) IMPLANT
TUBE CONNECTING 12X1/4 (SUCTIONS) ×2 IMPLANT
WATER STERILE IRR 3000ML UROMA (IV SOLUTION) ×2 IMPLANT

## 2024-05-15 NOTE — Discharge Instructions (Addendum)
 I have reviewed discharge instructions in detail with the patient. They will follow-up with me or their physician as scheduled. My nurse will also be calling the patients as per protocol.       No acetaminophen /Tylenol  until after 4:45 pm today if needed.     Post Anesthesia Home Care Instructions  Activity: Get plenty of rest for the remainder of the day. A responsible individual must stay with you for 24 hours following the procedure.  For the next 24 hours, DO NOT: -Drive a car -Advertising copywriter -Drink alcoholic beverages -Take any medication unless instructed by your physician -Make any legal decisions or sign important papers.  Meals: Start with liquid foods such as gelatin or soup. Progress to regular foods as tolerated. Avoid greasy, spicy, heavy foods. If nausea and/or vomiting occur, drink only clear liquids until the nausea and/or vomiting subsides. Call your physician if vomiting continues.  Special Instructions/Symptoms: Your throat may feel dry or sore from the anesthesia or the breathing tube placed in your throat during surgery. If this causes discomfort, gargle with warm salt water . The discomfort should disappear within 24 hours.

## 2024-05-15 NOTE — Interval H&P Note (Signed)
 History and Physical Interval Note:  05/15/2024 12:39 PM  [name]  has presented today for surgery, with the diagnosis of INTRINSIC SPHINCETER DEFICIENCY.  The various methods of treatment have been discussed with the patient and family. After consideration of risks, benefits and other options for treatment, the patient has consented to  Procedure(s) with comments: INJECTION, BULKING AGENT, URETHRA (N/A) - BULKAMID as a surgical intervention.  The patient's history has been reviewed, patient examined, no change in status, stable for surgery.  I have reviewed the patient's chart and labs.  Questions were answered to the patient's satisfaction.     Brittan Butterbaugh A Hardeep Reetz

## 2024-05-15 NOTE — Anesthesia Preprocedure Evaluation (Addendum)
 Anesthesia Evaluation  Patient identified by MRN, date of birth, ID band Patient awake    Reviewed: Allergy & Precautions, NPO status , Patient's Chart, lab work & pertinent test results, reviewed documented beta blocker date and time   History of Anesthesia Complications (+) DIFFICULT AIRWAY and history of anesthetic complications  Airway Mallampati: III  TM Distance: >3 FB Neck ROM: Full    Dental  (+) Teeth Intact, Dental Advisory Given   Pulmonary sleep apnea and Continuous Positive Airway Pressure Ventilation , former smoker   Pulmonary exam normal breath sounds clear to auscultation       Cardiovascular hypertension (156/66 preop, doesnt know where her BP normally runs), Pt. on medications and Pt. on home beta blockers + DVT (LLE DVT, eliquis  LD 3d ago)  Normal cardiovascular exam Rhythm:Regular Rate:Normal  Echo 2019 - Left ventricle: The cavity size was normal. There was mild focal    basal hypertrophy of the septum. Systolic function was vigorous.    The estimated ejection fraction was in the range of 65% to 70%.    Wall motion was normal; there were no regional wall motion    abnormalities. Doppler parameters are consistent with abnormal    left ventricular relaxation (grade 1 diastolic dysfunction).    Doppler parameters are consistent with elevated ventricular    end-diastolic filling pressure.  - Aortic valve: There was mild regurgitation. Valve area (VTI):    2.58 cm^2. Valve area (Vmax): 2.54 cm^2. Valve area (Vmean): 2.78    cm^2.  - Left atrium: The atrium was mildly dilated.     Neuro/Psych  PSYCHIATRIC DISORDERS  Depression     Neuromuscular disease (perkinsons)    GI/Hepatic Neg liver ROS,GERD  Controlled,,  Endo/Other    Class 3 obesity (BMI 43)  Renal/GU negative Renal ROS  negative genitourinary   Musculoskeletal  (+) Arthritis , Osteoarthritis,    Abdominal  (+) + obese  Peds   Hematology negative hematology ROS (+)   Anesthesia Other Findings   Reproductive/Obstetrics negative OB ROS                             Anesthesia Physical Anesthesia Plan  ASA: 3  Anesthesia Plan: MAC   Post-op Pain Management: Tylenol  PO (pre-op)*   Induction: Intravenous  PONV Risk Score and Plan: 2 and Dexamethasone , Ondansetron  and Treatment may vary due to age or medical condition  Airway Management Planned: Natural Airway and Simple Face Mask  Additional Equipment: None  Intra-op Plan:   Post-operative Plan:   Informed Consent: I have reviewed the patients History and Physical, chart, labs and discussed the procedure including the risks, benefits and alternatives for the proposed anesthesia with the patient or authorized representative who has indicated his/her understanding and acceptance.     Dental advisory given  Plan Discussed with: CRNA  Anesthesia Plan Comments: (Previously labeled difficult airway requiring glidescope, but then subsequently:  Ventilation: Mask ventilation without difficulty Laryngoscope Size: Mac and 3 Grade View: Grade II Tube type: Oral Tube size: 7.0 mm Number of attempts: 1 )        Anesthesia Quick Evaluation

## 2024-05-15 NOTE — Transfer of Care (Signed)
 Immediate Anesthesia Transfer of Care Note  Patient: [name]  Procedure(s) Performed: INJECTION, BULKING AGENT, URETHRA (Urethra) CYSTOSCOPY (Bladder)  Patient Location: PACU  Anesthesia Type:MAC  Level of Consciousness: patient cooperative  Airway & Oxygen  Therapy: Patient Spontanous Breathing and Patient connected to nasal cannula oxygen   Post-op Assessment: Report given to RN, Post -op Vital signs reviewed and stable, Patient moving all extremities X 4, and Patient able to stick tongue midline  Post vital signs: Reviewed and stable  Last Vitals:  Vitals Value Taken Time  BP 125/55   Temp 97.1   Pulse 63 05/15/24 1358  Resp 18 05/15/24 1358  SpO2 97 % 05/15/24 1358  Vitals shown include unfiled device data.  Last Pain:  Vitals:   05/15/24 1039  TempSrc: Oral  PainSc: 0-No pain      Patients Stated Pain Goal: 5 (05/15/24 1039)  Complications: No notable events documented.

## 2024-05-15 NOTE — Op Note (Signed)
 Preoperative diagnosis: Intrinsic sphincter deficiency and stress incontinence Postoperative diagnosis intrinsic sphincter deficiency and stress incontinence Surgery: Cystoscopy injection of Bulkamid Surgeon Dr. Geralyn Knee Jalila Goodnough  The patient has complicated incontinence with intrinsic sphincter deficiency.  She has had 1 previous treatment with moderate benefit.  She has a lot of bladder overactivity.  She is on Macrodantin  for recurrent urinary tract infections patient was given preoperative antibiotics.  I used a bulking med scope.  She denies straight urethra.  I injected primarily at the 5:00 2:00 and 10:00.  There was excellent coaptation with 2 syringes.  There was possibly 2 mm of previous injection material exposed at 7:00 and I stayed away from this area.  There is no inflammation.  The finding was almost within normal limits.  I decided to leave the bladder moderately full to expedite trial of voiding.  I had also cystoscope the patient prior to the bulking agent with a regular scope.  She had mild cystitis cystica but otherwise epithelium was normal and urine was clear

## 2024-05-16 ENCOUNTER — Encounter (HOSPITAL_COMMUNITY): Payer: Self-pay | Admitting: Urology

## 2024-05-17 NOTE — Anesthesia Postprocedure Evaluation (Signed)
 Anesthesia Post Note  Patient: [name]  Procedure(s) Performed: INJECTION, BULKING AGENT, URETHRA (Urethra) CYSTOSCOPY (Bladder)     Patient location during evaluation: PACU Anesthesia Type: MAC Level of consciousness: awake and alert Pain management: pain level controlled Vital Signs Assessment: post-procedure vital signs reviewed and stable Respiratory status: spontaneous breathing, nonlabored ventilation and respiratory function stable Cardiovascular status: blood pressure returned to baseline and stable Postop Assessment: no apparent nausea or vomiting Anesthetic complications: no   No notable events documented.  Last Vitals:  Vitals:   05/15/24 1358 05/15/24 1445  BP: (!) 125/55 128/78  Pulse:    Resp: (!) 24   Temp: (!) 36.2 C (!) 36.2 C  SpO2:  95%    Last Pain:  Vitals:   05/15/24 1445  TempSrc:   PainSc: 0-No pain                 Jacquelyne Matte

## 2024-05-23 ENCOUNTER — Ambulatory Visit: Admitting: Orthopaedic Surgery

## 2024-05-23 ENCOUNTER — Other Ambulatory Visit (INDEPENDENT_AMBULATORY_CARE_PROVIDER_SITE_OTHER)

## 2024-05-23 DIAGNOSIS — M25561 Pain in right knee: Secondary | ICD-10-CM

## 2024-05-23 DIAGNOSIS — G8929 Other chronic pain: Secondary | ICD-10-CM | POA: Diagnosis not present

## 2024-05-23 DIAGNOSIS — Z96652 Presence of left artificial knee joint: Secondary | ICD-10-CM

## 2024-05-23 NOTE — Progress Notes (Signed)
 The patient is an 83 year old female we have seen before.  We actually placed her left knee several years ago.  She does stay in independent living at [facility].  She ambulates with a rolling walker and is stating that her balance is really off of the standpoint.  She says both of her knees hurt.  She is not interested in any type of injection for her right knee and again we have replaced her left knee.  She says today she is not hurting as bad but it has been more of an issue of deconditioning.  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  This correlates with the pain she is having.  Both her knees seem to move well.  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  From my standpoint I think she would best benefit from outpatient physical therapy with any modalities that can strengthen her legs and work on her balance and coordination.  I gave her a prescription to take to [facility].  Follow-up with us  can be as needed.

## 2024-05-25 ENCOUNTER — Encounter: Payer: Self-pay | Admitting: Neurology

## 2024-06-05 DIAGNOSIS — N751 Abscess of Bartholin's gland: Secondary | ICD-10-CM | POA: Diagnosis not present

## 2024-06-05 DIAGNOSIS — N3946 Mixed incontinence: Secondary | ICD-10-CM | POA: Diagnosis not present

## 2024-06-08 ENCOUNTER — Other Ambulatory Visit: Payer: Self-pay | Admitting: Neurology

## 2024-06-08 DIAGNOSIS — G20A1 Parkinson's disease without dyskinesia, without mention of fluctuations: Secondary | ICD-10-CM

## 2024-06-14 ENCOUNTER — Other Ambulatory Visit: Payer: Self-pay | Admitting: Neurology

## 2024-06-14 DIAGNOSIS — G20A1 Parkinson's disease without dyskinesia, without mention of fluctuations: Secondary | ICD-10-CM

## 2024-06-19 DIAGNOSIS — N751 Abscess of Bartholin's gland: Secondary | ICD-10-CM | POA: Diagnosis not present

## 2024-06-25 DIAGNOSIS — N751 Abscess of Bartholin's gland: Secondary | ICD-10-CM | POA: Diagnosis not present

## 2024-07-17 ENCOUNTER — Other Ambulatory Visit: Payer: Self-pay

## 2024-07-17 ENCOUNTER — Telehealth: Payer: Self-pay | Admitting: Orthopaedic Surgery

## 2024-07-17 DIAGNOSIS — G8929 Other chronic pain: Secondary | ICD-10-CM

## 2024-07-17 DIAGNOSIS — Z96652 Presence of left artificial knee joint: Secondary | ICD-10-CM

## 2024-07-17 NOTE — Telephone Encounter (Signed)
 Referral placed.

## 2024-07-17 NOTE — Telephone Encounter (Signed)
 Aleck (PT) from [facility] [employer] called requesting new referral for PT. She states pt referral is expire and dated for 6/11. [facility] has a 30 day limit for written referrals. Please fax new referral to [phone].

## 2024-07-23 DIAGNOSIS — G20A1 Parkinson's disease without dyskinesia, without mention of fluctuations: Secondary | ICD-10-CM | POA: Diagnosis not present

## 2024-07-23 DIAGNOSIS — R2681 Unsteadiness on feet: Secondary | ICD-10-CM | POA: Diagnosis not present

## 2024-08-02 DIAGNOSIS — G3184 Mild cognitive impairment, so stated: Secondary | ICD-10-CM | POA: Diagnosis not present

## 2024-08-02 DIAGNOSIS — G4730 Sleep apnea, unspecified: Secondary | ICD-10-CM | POA: Diagnosis not present

## 2024-08-02 DIAGNOSIS — R269 Unspecified abnormalities of gait and mobility: Secondary | ICD-10-CM | POA: Diagnosis not present

## 2024-08-02 DIAGNOSIS — G4752 REM sleep behavior disorder: Secondary | ICD-10-CM | POA: Diagnosis not present

## 2024-08-02 DIAGNOSIS — G20A1 Parkinson's disease without dyskinesia, without mention of fluctuations: Secondary | ICD-10-CM | POA: Diagnosis not present

## 2024-08-02 DIAGNOSIS — K219 Gastro-esophageal reflux disease without esophagitis: Secondary | ICD-10-CM | POA: Diagnosis not present

## 2024-08-02 DIAGNOSIS — I10 Essential (primary) hypertension: Secondary | ICD-10-CM | POA: Diagnosis not present

## 2024-08-30 DIAGNOSIS — G4733 Obstructive sleep apnea (adult) (pediatric): Secondary | ICD-10-CM | POA: Diagnosis not present

## 2024-08-30 DIAGNOSIS — I10 Essential (primary) hypertension: Secondary | ICD-10-CM | POA: Diagnosis not present

## 2024-08-30 DIAGNOSIS — G4700 Insomnia, unspecified: Secondary | ICD-10-CM | POA: Diagnosis not present

## 2024-08-30 DIAGNOSIS — G3184 Mild cognitive impairment, so stated: Secondary | ICD-10-CM | POA: Diagnosis not present

## 2024-08-30 DIAGNOSIS — G4752 REM sleep behavior disorder: Secondary | ICD-10-CM | POA: Diagnosis not present

## 2024-08-30 DIAGNOSIS — G20A1 Parkinson's disease without dyskinesia, without mention of fluctuations: Secondary | ICD-10-CM | POA: Diagnosis not present

## 2024-09-10 ENCOUNTER — Other Ambulatory Visit: Payer: Self-pay | Admitting: Neurology

## 2024-09-10 DIAGNOSIS — G20A1 Parkinson's disease without dyskinesia, without mention of fluctuations: Secondary | ICD-10-CM

## 2024-09-19 DIAGNOSIS — R278 Other lack of coordination: Secondary | ICD-10-CM | POA: Diagnosis not present

## 2024-09-19 DIAGNOSIS — M6281 Muscle weakness (generalized): Secondary | ICD-10-CM | POA: Diagnosis not present

## 2024-09-27 DIAGNOSIS — M810 Age-related osteoporosis without current pathological fracture: Secondary | ICD-10-CM | POA: Diagnosis not present

## 2024-10-05 DIAGNOSIS — Z1331 Encounter for screening for depression: Secondary | ICD-10-CM | POA: Diagnosis not present

## 2024-10-05 DIAGNOSIS — G3184 Mild cognitive impairment, so stated: Secondary | ICD-10-CM | POA: Diagnosis not present

## 2024-10-05 DIAGNOSIS — Z86711 Personal history of pulmonary embolism: Secondary | ICD-10-CM | POA: Diagnosis not present

## 2024-10-05 DIAGNOSIS — R82998 Other abnormal findings in urine: Secondary | ICD-10-CM | POA: Diagnosis not present

## 2024-10-05 DIAGNOSIS — M810 Age-related osteoporosis without current pathological fracture: Secondary | ICD-10-CM | POA: Diagnosis not present

## 2024-10-05 DIAGNOSIS — F3342 Major depressive disorder, recurrent, in full remission: Secondary | ICD-10-CM | POA: Diagnosis not present

## 2024-10-05 DIAGNOSIS — G20A1 Parkinson's disease without dyskinesia, without mention of fluctuations: Secondary | ICD-10-CM | POA: Diagnosis not present

## 2024-10-05 DIAGNOSIS — Z23 Encounter for immunization: Secondary | ICD-10-CM | POA: Diagnosis not present

## 2024-10-05 DIAGNOSIS — I10 Essential (primary) hypertension: Secondary | ICD-10-CM | POA: Diagnosis not present

## 2024-10-05 DIAGNOSIS — Z1339 Encounter for screening examination for other mental health and behavioral disorders: Secondary | ICD-10-CM | POA: Diagnosis not present

## 2024-10-05 DIAGNOSIS — F419 Anxiety disorder, unspecified: Secondary | ICD-10-CM | POA: Diagnosis not present

## 2024-10-05 DIAGNOSIS — R197 Diarrhea, unspecified: Secondary | ICD-10-CM | POA: Diagnosis not present

## 2024-10-05 DIAGNOSIS — Z0000 Encounter for general adult medical examination without abnormal findings: Secondary | ICD-10-CM | POA: Diagnosis not present

## 2024-10-05 DIAGNOSIS — J3489 Other specified disorders of nose and nasal sinuses: Secondary | ICD-10-CM | POA: Diagnosis not present

## 2024-10-05 DIAGNOSIS — D6869 Other thrombophilia: Secondary | ICD-10-CM | POA: Diagnosis not present

## 2024-10-09 ENCOUNTER — Other Ambulatory Visit: Payer: Self-pay

## 2024-10-09 ENCOUNTER — Inpatient Hospital Stay (HOSPITAL_COMMUNITY)
Admission: EM | Admit: 2024-10-09 | Discharge: 2024-10-11 | DRG: 871 | Disposition: A | Discharge status: Home Health | Source: Skilled Nursing Facility | Attending: Internal Medicine | Admitting: Internal Medicine

## 2024-10-09 ENCOUNTER — Emergency Department (HOSPITAL_COMMUNITY)

## 2024-10-09 ENCOUNTER — Encounter (HOSPITAL_COMMUNITY): Payer: Self-pay

## 2024-10-09 DIAGNOSIS — J189 Pneumonia, unspecified organism: Principal | ICD-10-CM | POA: Diagnosis present

## 2024-10-09 DIAGNOSIS — M810 Age-related osteoporosis without current pathological fracture: Secondary | ICD-10-CM | POA: Diagnosis present

## 2024-10-09 DIAGNOSIS — Z1152 Encounter for screening for COVID-19: Secondary | ICD-10-CM

## 2024-10-09 DIAGNOSIS — Z6841 Body Mass Index (BMI) 40.0 and over, adult: Secondary | ICD-10-CM

## 2024-10-09 DIAGNOSIS — K219 Gastro-esophageal reflux disease without esophagitis: Secondary | ICD-10-CM | POA: Diagnosis not present

## 2024-10-09 DIAGNOSIS — Z79899 Other long term (current) drug therapy: Secondary | ICD-10-CM

## 2024-10-09 DIAGNOSIS — Z743 Need for continuous supervision: Secondary | ICD-10-CM | POA: Diagnosis not present

## 2024-10-09 DIAGNOSIS — F0280 Dementia in other diseases classified elsewhere without behavioral disturbance: Secondary | ICD-10-CM | POA: Diagnosis present

## 2024-10-09 DIAGNOSIS — Z884 Allergy status to anesthetic agent status: Secondary | ICD-10-CM

## 2024-10-09 DIAGNOSIS — I10 Essential (primary) hypertension: Secondary | ICD-10-CM | POA: Diagnosis not present

## 2024-10-09 DIAGNOSIS — Z87891 Personal history of nicotine dependence: Secondary | ICD-10-CM

## 2024-10-09 DIAGNOSIS — Z885 Allergy status to narcotic agent status: Secondary | ICD-10-CM

## 2024-10-09 DIAGNOSIS — A419 Sepsis, unspecified organism: Principal | ICD-10-CM | POA: Diagnosis present

## 2024-10-09 DIAGNOSIS — Z90710 Acquired absence of both cervix and uterus: Secondary | ICD-10-CM

## 2024-10-09 DIAGNOSIS — Z888 Allergy status to other drugs, medicaments and biological substances status: Secondary | ICD-10-CM | POA: Diagnosis not present

## 2024-10-09 DIAGNOSIS — Z66 Do not resuscitate: Secondary | ICD-10-CM | POA: Diagnosis not present

## 2024-10-09 DIAGNOSIS — R059 Cough, unspecified: Secondary | ICD-10-CM | POA: Diagnosis not present

## 2024-10-09 DIAGNOSIS — R5381 Other malaise: Secondary | ICD-10-CM | POA: Diagnosis not present

## 2024-10-09 DIAGNOSIS — I517 Cardiomegaly: Secondary | ICD-10-CM | POA: Diagnosis not present

## 2024-10-09 DIAGNOSIS — G20A1 Parkinson's disease without dyskinesia, without mention of fluctuations: Secondary | ICD-10-CM | POA: Diagnosis not present

## 2024-10-09 DIAGNOSIS — Z86718 Personal history of other venous thrombosis and embolism: Secondary | ICD-10-CM | POA: Diagnosis not present

## 2024-10-09 DIAGNOSIS — Z96652 Presence of left artificial knee joint: Secondary | ICD-10-CM | POA: Diagnosis present

## 2024-10-09 DIAGNOSIS — R0602 Shortness of breath: Secondary | ICD-10-CM | POA: Diagnosis not present

## 2024-10-09 DIAGNOSIS — Z7901 Long term (current) use of anticoagulants: Secondary | ICD-10-CM

## 2024-10-09 DIAGNOSIS — R058 Other specified cough: Secondary | ICD-10-CM | POA: Diagnosis not present

## 2024-10-09 DIAGNOSIS — E876 Hypokalemia: Secondary | ICD-10-CM

## 2024-10-09 DIAGNOSIS — G250 Essential tremor: Secondary | ICD-10-CM | POA: Diagnosis present

## 2024-10-09 DIAGNOSIS — E669 Obesity, unspecified: Secondary | ICD-10-CM | POA: Diagnosis present

## 2024-10-09 DIAGNOSIS — I771 Stricture of artery: Secondary | ICD-10-CM | POA: Diagnosis not present

## 2024-10-09 LAB — CBC WITH DIFFERENTIAL/PLATELET
Abs Immature Granulocytes: 0.08 K/uL — ABNORMAL HIGH (ref 0.00–0.07)
Basophils Absolute: 0.1 K/uL (ref 0.0–0.1)
Basophils Relative: 1 %
Eosinophils Absolute: 0.2 K/uL (ref 0.0–0.5)
Eosinophils Relative: 1 %
HCT: 35.2 % — ABNORMAL LOW (ref 36.0–46.0)
Hemoglobin: 11.1 g/dL — ABNORMAL LOW (ref 12.0–15.0)
Immature Granulocytes: 1 %
Lymphocytes Relative: 14 %
Lymphs Abs: 2 K/uL (ref 0.7–4.0)
MCH: 30.1 pg (ref 26.0–34.0)
MCHC: 31.5 g/dL (ref 30.0–36.0)
MCV: 95.4 fL (ref 80.0–100.0)
Monocytes Absolute: 1.6 K/uL — ABNORMAL HIGH (ref 0.1–1.0)
Monocytes Relative: 11 %
Neutro Abs: 10.6 K/uL — ABNORMAL HIGH (ref 1.7–7.7)
Neutrophils Relative %: 72 %
Platelets: 195 K/uL (ref 150–400)
RBC: 3.69 MIL/uL — ABNORMAL LOW (ref 3.87–5.11)
RDW: 14 % (ref 11.5–15.5)
WBC: 14.6 K/uL — ABNORMAL HIGH (ref 4.0–10.5)
nRBC: 0 % (ref 0.0–0.2)

## 2024-10-09 LAB — COMPREHENSIVE METABOLIC PANEL WITH GFR
ALT: <5 U/L (ref 0–44)
AST: 16 U/L (ref 15–41)
Albumin: 3.7 g/dL (ref 3.5–5.0)
Alkaline Phosphatase: 91 U/L (ref 38–126)
Anion gap: 11 (ref 5–15)
BUN: 17 mg/dL (ref 8–23)
CO2: 26 mmol/L (ref 22–32)
Calcium: 8.7 mg/dL — ABNORMAL LOW (ref 8.9–10.3)
Chloride: 101 mmol/L (ref 98–111)
Creatinine, Ser: 0.8 mg/dL (ref 0.44–1.00)
GFR, Estimated: >60 mL/min (ref >60)
Glucose, Bld: 101 mg/dL — ABNORMAL HIGH (ref 70–99)
Potassium: 3.4 mmol/L — ABNORMAL LOW (ref 3.5–5.1)
Sodium: 137 mmol/L (ref 135–145)
Total Bilirubin: 0.4 mg/dL (ref 0.0–1.2)
Total Protein: 7.2 g/dL (ref 6.5–8.1)

## 2024-10-09 LAB — RESP PANEL BY RT-PCR (RSV, FLU A&B, COVID)  RVPGX2
Influenza A by PCR: NEGATIVE
Influenza B by PCR: NEGATIVE
Resp Syncytial Virus by PCR: NEGATIVE
SARS Coronavirus 2 by RT PCR: NEGATIVE

## 2024-10-09 LAB — TROPONIN T, HIGH SENSITIVITY
Troponin T High Sensitivity: <15 ng/L (ref 0–19)
Troponin T High Sensitivity: <15 ng/L (ref 0–19)

## 2024-10-09 LAB — PRO BRAIN NATRIURETIC PEPTIDE: Pro Brain Natriuretic Peptide: 251 pg/mL (ref <300.0)

## 2024-10-09 LAB — LACTIC ACID, PLASMA
Lactic Acid, Venous: 1.4 mmol/L (ref 0.5–1.9)
Lactic Acid, Venous: 1.9 mmol/L (ref 0.5–1.9)

## 2024-10-09 LAB — MRSA NEXT GEN BY PCR, NASAL: MRSA by PCR Next Gen: NOT DETECTED

## 2024-10-09 MED ORDER — CARBIDOPA-LEVODOPA 25-100 MG PO TABS: 3.0000 | ORAL_TABLET | Freq: Every day | ORAL | Status: DC

## 2024-10-09 MED ORDER — CARBIDOPA-LEVODOPA 25-100 MG PO TABS: 1.0000 | ORAL_TABLET | ORAL | Status: DC

## 2024-10-09 MED ORDER — SODIUM CHLORIDE 0.9 % IV SOLN
500.0000 mg | INTRAVENOUS | Status: DC
  Administered 2024-10-10: 500 mg via INTRAVENOUS
  Filled 2024-10-09 (×2): qty 5

## 2024-10-09 MED ORDER — PROPRANOLOL HCL 10 MG PO TABS
10.0000 mg | ORAL_TABLET | Freq: Two times a day (BID) | ORAL | Status: DC
  Administered 2024-10-09 – 2024-10-11 (×4): 10 mg via ORAL
  Filled 2024-10-09 (×4): qty 1

## 2024-10-09 MED ORDER — FUROSEMIDE 40 MG PO TABS
40.0000 mg | ORAL_TABLET | Freq: Every day | ORAL | Status: DC
  Administered 2024-10-10 – 2024-10-11 (×2): 40 mg via ORAL
  Filled 2024-10-09 (×2): qty 1

## 2024-10-09 MED ORDER — CARBIDOPA-LEVODOPA 25-100 MG PO TABS
3.0000 | ORAL_TABLET | Freq: Every day | ORAL | Status: DC
Start: 2024-10-10 — End: 2024-10-11
  Administered 2024-10-10: 3 via ORAL
  Filled 2024-10-09: qty 3

## 2024-10-09 MED ORDER — BUSPIRONE HCL 10 MG PO TABS
10.0000 mg | ORAL_TABLET | Freq: Two times a day (BID) | ORAL | Status: DC
  Administered 2024-10-09 – 2024-10-11 (×4): 10 mg via ORAL
  Filled 2024-10-09 (×4): qty 1

## 2024-10-09 MED ORDER — SODIUM CHLORIDE 0.9 % IV SOLN
1.0000 g | Freq: Once | INTRAVENOUS | Status: AC
  Administered 2024-10-09: 1 g via INTRAVENOUS
  Filled 2024-10-09: qty 10

## 2024-10-09 MED ORDER — TRAZODONE HCL 50 MG PO TABS: 25.0000 mg | ORAL_TABLET | Freq: Every evening | ORAL | Status: DC | PRN

## 2024-10-09 MED ORDER — ACETAMINOPHEN 325 MG PO TABS: 650.0000 mg | ORAL_TABLET | Freq: Four times a day (QID) | ORAL | Status: DC | PRN

## 2024-10-09 MED ORDER — CARBIDOPA-LEVODOPA 25-100 MG PO TABS
1.0000 | ORAL_TABLET | Freq: Every day | ORAL | Status: DC
  Administered 2024-10-09 – 2024-10-10 (×2): 1 via ORAL
  Filled 2024-10-09: qty 1

## 2024-10-09 MED ORDER — CARBIDOPA-LEVODOPA 25-100 MG PO TABS: 2.0000 | ORAL_TABLET | Freq: Every day | ORAL | Status: DC

## 2024-10-09 MED ORDER — ACETAMINOPHEN 650 MG RE SUPP: 650.0000 mg | Freq: Four times a day (QID) | RECTAL | Status: DC | PRN

## 2024-10-09 MED ORDER — SODIUM CHLORIDE 0.9 % IV SOLN
1.0000 g | INTRAVENOUS | Status: DC
  Administered 2024-10-10: 1 g via INTRAVENOUS
  Filled 2024-10-09: qty 10

## 2024-10-09 MED ORDER — CLONAZEPAM 0.125 MG PO TBDP
0.2500 mg | ORAL_TABLET | Freq: Every day | ORAL | Status: DC
  Administered 2024-10-09 – 2024-10-10 (×2): 0.25 mg via ORAL
  Filled 2024-10-09 (×2): qty 2

## 2024-10-09 MED ORDER — POTASSIUM CHLORIDE CRYS ER 20 MEQ PO TBCR
40.0000 meq | EXTENDED_RELEASE_TABLET | Freq: Once | ORAL | Status: AC
  Administered 2024-10-09: 40 meq via ORAL
  Filled 2024-10-09: qty 2

## 2024-10-09 MED ORDER — DONEPEZIL HCL 10 MG PO TABS
10.0000 mg | ORAL_TABLET | Freq: Every day | ORAL | Status: DC
  Administered 2024-10-10 – 2024-10-11 (×2): 10 mg via ORAL
  Filled 2024-10-09 (×2): qty 1

## 2024-10-09 MED ORDER — IPRATROPIUM-ALBUTEROL 0.5-2.5 (3) MG/3ML IN SOLN
3.0000 mL | Freq: Once | RESPIRATORY_TRACT | Status: AC
  Administered 2024-10-09: 3 mL via RESPIRATORY_TRACT
  Filled 2024-10-09: qty 3

## 2024-10-09 MED ORDER — MIRABEGRON ER 25 MG PO TB24
50.0000 mg | ORAL_TABLET | Freq: Every day | ORAL | Status: DC
  Administered 2024-10-09 – 2024-10-10 (×2): 50 mg via ORAL
  Filled 2024-10-09 (×3): qty 2

## 2024-10-09 MED ORDER — ONDANSETRON HCL 4 MG PO TABS: 4.0000 mg | ORAL_TABLET | Freq: Four times a day (QID) | ORAL | Status: DC | PRN

## 2024-10-09 MED ORDER — ONDANSETRON HCL 4 MG/2ML IJ SOLN: 4.0000 mg | Freq: Four times a day (QID) | INTRAMUSCULAR | Status: DC | PRN

## 2024-10-09 MED ORDER — APIXABAN 2.5 MG PO TABS
5.0000 mg | ORAL_TABLET | Freq: Two times a day (BID) | ORAL | Status: DC
  Administered 2024-10-09 – 2024-10-11 (×4): 5 mg via ORAL
  Filled 2024-10-09 (×5): qty 2

## 2024-10-09 MED ORDER — ALBUTEROL SULFATE (2.5 MG/3ML) 0.083% IN NEBU
2.5000 mg | INHALATION_SOLUTION | RESPIRATORY_TRACT | Status: DC | PRN
Start: 2024-10-09 — End: 2024-10-11
  Administered 2024-10-10: 2.5 mg via RESPIRATORY_TRACT
  Filled 2024-10-09: qty 3

## 2024-10-09 MED ORDER — CARBIDOPA-LEVODOPA 25-100 MG PO TABS: 1.0000 | ORAL_TABLET | Freq: Every day | ORAL | Status: DC

## 2024-10-09 MED ORDER — ESCITALOPRAM OXALATE 20 MG PO TABS
20.0000 mg | ORAL_TABLET | Freq: Every day | ORAL | Status: DC
  Administered 2024-10-10 – 2024-10-11 (×2): 20 mg via ORAL
  Filled 2024-10-09 (×2): qty 1

## 2024-10-09 MED ORDER — GUAIFENESIN-DM 100-10 MG/5ML PO SYRP: 5.0000 mL | ORAL_SOLUTION | ORAL | Status: DC | PRN

## 2024-10-09 MED ORDER — CARBIDOPA-LEVODOPA 25-100 MG PO TABS
2.0000 | ORAL_TABLET | Freq: Every day | ORAL | Status: DC
Start: 2024-10-10 — End: 2024-10-11
  Administered 2024-10-10 – 2024-10-11 (×2): 2 via ORAL
  Filled 2024-10-09 (×2): qty 2

## 2024-10-09 MED ORDER — SODIUM CHLORIDE 0.9 % IV BOLUS
500.0000 mL | Freq: Once | INTRAVENOUS | Status: AC
  Administered 2024-10-09: 500 mL via INTRAVENOUS

## 2024-10-09 MED ORDER — IOHEXOL 350 MG/ML SOLN
75.0000 mL | Freq: Once | INTRAVENOUS | Status: AC | PRN
  Administered 2024-10-09: 75 mL via INTRAVENOUS

## 2024-10-09 MED ORDER — SODIUM CHLORIDE 0.9 % IV SOLN
500.0000 mg | Freq: Once | INTRAVENOUS | Status: AC
  Administered 2024-10-09: 500 mg via INTRAVENOUS
  Filled 2024-10-09: qty 5

## 2024-10-09 MED ORDER — ACETAMINOPHEN 325 MG PO TABS
650.0000 mg | ORAL_TABLET | Freq: Once | ORAL | Status: AC
  Administered 2024-10-09: 650 mg via ORAL
  Filled 2024-10-09: qty 2

## 2024-10-09 MED ORDER — PANTOPRAZOLE SODIUM 40 MG PO TBEC
40.0000 mg | DELAYED_RELEASE_TABLET | Freq: Every day | ORAL | Status: DC
  Administered 2024-10-10 – 2024-10-11 (×2): 40 mg via ORAL
  Filled 2024-10-09 (×2): qty 1

## 2024-10-09 NOTE — Sepsis Progress Note (Addendum)
eLink is following this Code Sepsis.  Notified provider of need to order lactic acid.  

## 2024-10-09 NOTE — ED Triage Notes (Signed)
 Pt arrives via EMS from the [facility] home -[facility]. Pt has complaints of shortness of breath with a productive cough X2 days. EMS administered neb due to wheezing. Pt has a hx of pulmonary embolism, on Eliquis  daily.   Pt repeatedly clearing throat during triage.

## 2024-10-09 NOTE — ED Provider Notes (Signed)
 [facility] Provider Note  CSN: [id number] Arrival date & time: 10/09/24 0818  Chief Complaint(s) Shortness of Breath and Cough  HPI [name] is a 83 y.o. female with past medical history as below, significant for DVT, GERD, hypertension, Parkinson's disease, dementia, PE who presents to the ED with complaint of chest pain, dyspnea, cough, malaise  Difficulty breathing over the past 2 days, cough, difficulty expectorating sputum.  Chest tightness.  Body aches, chills.  Poor appetite.  No known sick contacts.  Given nebulized breathing treatment prior to arrival.  Patient on supplemental oxygen  on arrival which she does not use at home.  No abdominal pain, no change in bowel or bladder function, rash.  Past Medical History Past Medical History:  Diagnosis Date   Arthritis    arthritis in joints and back   Benign familial tremor    Clostridium difficile infection    DDD (degenerative disc disease), lumbar    Diverticulosis    DVT (deep venous thrombosis) (HCC)    left leg   Dyspnea    Gallstones    GERD (gastroesophageal reflux disease)    Hypertension    Neuromuscular disorder (HCC)    Parkinson's   Osteoporosis    Sleep apnea    Wears glasses    Patient Active Problem List   Diagnosis Date Noted   Sepsis due to pneumonia (HCC) 10/09/2024   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/06/2020   Unilateral primary osteoarthritis, left knee 02/21/2020   Depression 05/25/2019   Skin cancer 05/25/2019   Metatarsalgia of right foot 08/04/2018   Pulmonary embolus (HCC) 05/19/2018   Pulmonary embolism (HCC) 05/18/2018   HTN (hypertension) 05/18/2018   Lumbar radiculopathy 11/13/2016   Hallux rigidus of right foot 05/19/2016   Onychomycosis of left great toe 05/19/2016   Gait disturbance 04/05/2016   Lumbar spinal stenosis 02/20/2016   Claudication 02/03/2016   Ectropion of both eyes 04/07/2015   Retinal drusen of both eyes  04/07/2015   Vitreous syneresis of both eyes 04/07/2015   Sleep apnea, obstructive 07/29/2014   Acute laryngitis 07/24/2014   Allergic rhinitis 07/24/2014   Hearing loss 07/24/2014   Hip osteoarthritis 07/24/2014   Hoarseness 07/24/2014   Lumbar degenerative disc disease 07/24/2014   Lumbar spondylosis 07/24/2014   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 Authorizing Provider  acetaminophen  (TYLENOL ) 650 MG CR tablet Take 650 mg by mouth every 8 (eight) hours as needed for pain.   Yes [provider]  albuterol  (VENTOLIN  HFA) 108 (90 Base) MCG/ACT inhaler Inhale 2 puffs into the lungs every 6 (six) hours as needed for wheezing or shortness of breath. 06/21/22  Yes [provider]  apixaban  (ELIQUIS ) 5 MG TABS tablet Take 1 tablet (5 mg total) by mouth 2 (two) times daily. 06/10/20  Yes Vernetta Lonni GRADE, MD  busPIRone  (BUSPAR ) 10 MG tablet Take 10 mg by mouth 2 (two) times daily.  05/17/18  Yes [provider]  carbidopa -levodopa  (SINEMET  IR) 25-100 MG tablet TAKE 2 TABLETS BY MOUTH DAILY AT 9 A.M. THEN 2 TABLETS AT 1 P.M. THEN 1 TABLET AT 5 P.M. AS DIRECTED Patient taking differently: Take 1-3 tablets  by mouth See admin instructions. Take 2 tablets by mouth every morning, 3 tablets during the day, and 1 tablet at bedtime. 09/10/24  Yes Tat, Asberry RAMAN, DO  cholecalciferol  (VITAMIN D ) 25 MCG (1000 UNIT) tablet Take 1,000 Units by mouth daily.   Yes [provider]  clonazePAM  (KLONOPIN ) 0.5 MG tablet Take 0.5 tablets (0.25 mg total) by mouth at bedtime. 03/29/24  Yes Tat, Asberry RAMAN, DO  dextromethorphan-guaiFENesin (MUCINEX  DM) 30-600 MG 12hr tablet Take 1 tablet by mouth 2 (two) times daily as needed for cough.   Yes [provider]  diphenhydramine -acetaminophen  (TYLENOL  PM) 25-500 MG TABS tablet Take 1 tablet by mouth at bedtime as needed (Sleep/pain).   Yes [provider]  donepezil  (ARICEPT ) 10 MG tablet Take 10 mg by mouth in the morning.   Yes [provider]  escitalopram  (LEXAPRO ) 20 MG tablet Take 20 mg by mouth daily.   Yes [provider]  esomeprazole (NEXIUM) 40 MG capsule Take 40 mg by mouth 2 (two) times daily before a meal.  10/01/15  Yes [provider]  furosemide (LASIX) 40 MG tablet Take 40 mg by mouth daily.   Yes [provider]  ipratropium (ATROVENT) 0.06 % nasal spray Place 2 sprays into both nostrils 2 (two) times daily as needed for rhinitis. 10/05/24  Yes [provider]  loratadine  (CLARITIN ) 10 MG tablet Take 10 mg by mouth at bedtime.   Yes [provider]  mirabegron  ER (MYRBETRIQ ) 50 MG TB24 tablet Take 50 mg by mouth at bedtime.  02/13/16  Yes [provider]  nitrofurantoin  (MACRODANTIN ) 100 MG capsule Take 100 mg by mouth daily. 09/27/24  Yes [provider]  olmesartan (BENICAR) 20 MG tablet Take 20 mg by mouth in the morning and at bedtime.    Yes [provider]  propranolol  (INDERAL ) 10 MG tablet TAKE 1 TABLET BY MOUTH 2 TIMES A DAY 09/10/24  Yes Tat, Asberry RAMAN, DO  saccharomyces boulardii (FLORASTOR) 250 MG capsule Take 250 mg by mouth 2 (two) times daily.   Yes [provider]  vitamin B-12 (CYANOCOBALAMIN ) 1000 MCG tablet Take 1,000 mcg by mouth daily.   Yes [provider]  NON FORMULARY Pt uses a cpap nightly    [provider]  carvedilol (COREG) 6.25 MG tablet  03/07/19 01/09/20  [provider]                                                                                                                                    Past Surgical  History Past Surgical History:  Procedure Laterality Date   ABDOMINAL HYSTERECTOMY  1983   arterial vascular fistula  2004   coiled at [facility] REPAIR  [phone]   CARPAL TUNNEL RELEASE  right   CHOLECYSTECTOMY N/A 03/29/2013   Procedure: LAPAROSCOPIC CHOLECYSTECTOMY WITH INTRAOPERATIVE CHOLANGIOGRAM;  Surgeon: Sherlean JINNY Laughter, MD;  Location:  [facility];  Service: General;  Laterality: N/A;   CYSTOSCOPY N/A 05/15/2024   Procedure: CYSTOSCOPY;  Surgeon: Gaston Hamilton, MD;  Location: [facility] OR;  Service: Urology;  Laterality: N/A;   CYSTOSCOPY WITH INJECTION N/A 11/01/2023   Procedure: CYSTOSCOPY WITH INJECTION BULKAMID INJECTION;  Surgeon: Gaston Hamilton, MD;  Location: [facility] ORS;  Service: Urology;  Laterality: N/A;  30 MINS FOR CASE   EUS N/A 03/09/2013   Procedure: UPPER ENDOSCOPIC ULTRASOUND (EUS) LINEAR;  Surgeon: Belvie JONETTA Just, MD;  Location: [facility];  Service: Endoscopy;  Laterality: N/A;   INJECTION, BULKING AGENT, URETHRA N/A 05/15/2024   Procedure: INJECTION, BULKING AGENT, URETHRA;  Surgeon: Gaston Hamilton, MD;  Location: [facility] OR;  Service: Urology;  Laterality: N/A;  BULKAMID   LUMBAR LAMINECTOMY/DECOMPRESSION MICRODISCECTOMY  11/30/2012   Procedure: LUMBAR LAMINECTOMY/DECOMPRESSION MICRODISCECTOMY 2 LEVELS;  Surgeon: Lynwood SHAUNNA Bern, MD;  Location: [facility];  Service: Orthopedics;  Laterality: N/A;  DECOMPRESSION LAMINECTOMY L4-L5, L5-S1 CENTRAL    ROTATOR CUFF REPAIR Bilateral 2004   two on right, one on left   TOTAL KNEE ARTHROPLASTY Left 06/06/2020   Procedure: LEFT TOTAL KNEE ARTHROPLASTY;  Surgeon: Vernetta Lonni GRADE, MD;  Location: [facility];  Service: Orthopedics;  Laterality: Left;   TRIGGER FINGER RELEASE Right     hand   Family History Family History  Problem Relation Age of Onset   Other Mother        sarcoma   Other Father        broken hip   Dementia Father    Pneumonia Father    Sleep apnea Neg Hx     Social History Social  History   Tobacco Use   Smoking status: Former    Current packs/day: 0.00    Types: Cigarettes    Quit date: 07/11/1970    Years since quitting: 54.2   Smokeless tobacco: Never  Vaping Use   Vaping status: Never Used  Substance Use Topics   Alcohol  use: Yes    Alcohol /week: 0.0 standard drinks of alcohol     Comment: wine occasionally   Drug use: No   Allergies Buprenorphine hcl, Codeine, Morphine, and Procaine  Review of Systems A thorough review of systems was obtained and all systems are negative except as noted in the HPI and PMH.   Physical Exam Vital Signs  I have reviewed the triage vital signs BP (!) 154/82 (BP Location: Right Arm)   Pulse 73   Temp 98.2 F (36.8 C)   Resp 18   Ht 4' 11 (1.499 m)   SpO2 96%   BMI 43.22 kg/m  Physical Exam Vitals and nursing note reviewed.  Constitutional:      General: She is not in acute distress.    Appearance: Normal appearance. She is obese.  HENT:     Head: Normocephalic and atraumatic.     Right Ear: External ear normal.     Left Ear: External ear normal.     Nose: Nose normal.     Mouth/Throat:     Mouth: Mucous membranes are moist.  Eyes:     General: No scleral icterus.       Right eye: No discharge.        Left eye: No discharge.  Cardiovascular:     Rate and Rhythm: Normal rate and regular rhythm.     Pulses: Normal pulses.     Heart sounds: Normal heart sounds.  Pulmonary:     Effort: Pulmonary effort is normal. Tachypnea  present. No respiratory distress.     Breath sounds: No stridor. Decreased breath sounds present.  Abdominal:     General: Abdomen is flat. There is no distension.     Palpations: Abdomen is soft.     Tenderness: There is no abdominal tenderness.  Musculoskeletal:     Cervical back: No rigidity.     Right lower leg: No edema.     Left lower leg: No edema.  Skin:    General: Skin is warm and dry.     Capillary Refill: Capillary refill takes less than 2 seconds.  Neurological:      Mental Status: She is alert.  Psychiatric:        Mood and Affect: Mood normal.        Behavior: Behavior normal. Behavior is cooperative.     ED Results and Treatments Labs (all labs ordered are listed, but only abnormal results are displayed) Labs Reviewed  CBC WITH DIFFERENTIAL/PLATELET - Abnormal; Notable for the following components:      Result Value   WBC 14.6 (*)    RBC 3.69 (*)    Hemoglobin 11.1 (*)    HCT 35.2 (*)    Neutro Abs 10.6 (*)    Monocytes Absolute 1.6 (*)    Abs Immature Granulocytes 0.08 (*)    All other components within normal limits  COMPREHENSIVE METABOLIC PANEL WITH GFR - Abnormal; Notable for the following components:   Potassium 3.4 (*)    Glucose, Bld 101 (*)    Calcium  8.7 (*)    All other components within normal limits  BASIC METABOLIC PANEL WITH GFR - Abnormal; Notable for the following components:   Glucose, Bld 105 (*)    Calcium  8.7 (*)    All other components within normal limits  CBC - Abnormal; Notable for the following components:   WBC 11.3 (*)    RBC 3.45 (*)    Hemoglobin 10.6 (*)    HCT 33.2 (*)    All other components within normal limits  RESP PANEL BY RT-PCR (RSV, FLU A&B, COVID)  RVPGX2  CULTURE, BLOOD (ROUTINE X 2)  MRSA NEXT GEN BY PCR, NASAL  CULTURE, BLOOD (ROUTINE X 2)  PRO BRAIN NATRIURETIC PEPTIDE  LACTIC ACID, PLASMA  LACTIC ACID, PLASMA  LEGIONELLA PNEUMOPHILA SEROGP 1 UR AG  STREP PNEUMONIAE URINARY ANTIGEN  TROPONIN T, HIGH SENSITIVITY  TROPONIN T, HIGH SENSITIVITY                                                                                                                          Radiology CT Angio Chest PE W and/or Wo Contrast Result Date: 10/09/2024 CLINICAL DATA:  Pulmonary embolism (PE) suspected, high prob Shortness of breath with productive cough for 2 days. EXAM: CT ANGIOGRAPHY CHEST WITH CONTRAST TECHNIQUE: Multidetector CT imaging of the chest was performed using the standard protocol during  bolus administration of intravenous contrast. Multiplanar CT image reconstructions and MIPs were obtained to evaluate  the vascular anatomy. RADIATION DOSE REDUCTION: This exam was performed according to the departmental dose-optimization program which includes automated exposure control, adjustment of the mA and/or kV according to patient size and/or use of iterative reconstruction technique. CONTRAST:  75mL OMNIPAQUE  IOHEXOL  350 MG/ML SOLN COMPARISON:  Same day radiographs.  Chest CTA 05/18/2018. FINDINGS: Cardiovascular: The pulmonary arteries are well opacified with contrast to the level of the segmental branches. There is no evidence of acute pulmonary embolism. There is central enlargement of the pulmonary arteries consistent with pulmonary arterial hypertension. Atherosclerosis of the aorta, great vessels and coronary arteries. No acute systemic arterial abnormalities are identified. There are calcifications of the aortic valve. The heart is mildly enlarged. No significant pericardial fluid. Mediastinum/Nodes: There are no enlarged mediastinal, hilar or axillary lymph nodes. The thyroid  gland, trachea and esophagus demonstrate no significant findings. Lungs/Pleura: No pleural effusion or pneumothorax. Central airway thickening with new multifocal patchy ground-glass opacities in both lungs. No confluent airspace disease or suspicious nodularity. Upper abdomen: No significant findings are seen in the visualized upper abdomen status post cholecystectomy. Musculoskeletal/Chest wall: There is no chest wall mass or suspicious osseous finding. Mild multilevel spondylosis. Postsurgical changes at the right shoulder. Review of the MIP images confirms the above findings. IMPRESSION: 1. No evidence of acute pulmonary embolism or other acute vascular findings in the chest. 2. Central airway thickening with new multifocal patchy ground-glass opacities in both lungs, likely infectious or inflammatory. No confluent airspace  disease or suspicious nodularity. Recommend clinical correlation and radiographic follow-up. 3. Central enlargement of the pulmonary arteries consistent with pulmonary arterial hypertension. 4.  Aortic Atherosclerosis (ICD10-I70.0). Electronically Signed   By: Elsie Perone M.D.   On: 10/09/2024 12:39   DG Chest Port 1 View Result Date: 10/09/2024 EXAM: 1 VIEW(S) XRAY OF THE CHEST 10/09/2024 09:46:00 AM COMPARISON: Comparison in 06/19/2022. CLINICAL HISTORY: Pt has complaints of shortness of breath with a productive cough X2 days. FINDINGS: LUNGS AND PLEURA: No focal pulmonary opacity. HEART AND MEDIASTINUM: Cardiac shadow is enlarged but stable. Tortuous thoracic aorta. Aortic atherosclerotic calcifications. BONES AND SOFT TISSUES: No acute osseous abnormality. IMPRESSION: 1. No acute cardiopulmonary process. 2. Borderline cardiomegaly, unchanged from prior. Electronically signed by: Oneil Devonshire MD 10/09/2024 10:08 AM EDT RP Workstation: [id number]    Pertinent labs & imaging results that were available during my care of the patient were reviewed by me and considered in my medical decision making (see MDM for details).  Medications Ordered in ED Medications  acetaminophen  (TYLENOL ) tablet 650 mg (has no administration in time range)    Or  acetaminophen  (TYLENOL ) suppository 650 mg (has no administration in time range)  traZODone (DESYREL) tablet 25 mg (has no administration in time range)  ondansetron  (ZOFRAN ) tablet 4 mg (has no administration in time range)    Or  ondansetron  (ZOFRAN ) injection 4 mg (has no administration in time range)  albuterol  (PROVENTIL ) (2.5 MG/3ML) 0.083% nebulizer solution 2.5 mg (2.5 mg Nebulization Given 10/10/24 0441)  azithromycin (ZITHROMAX) 500 mg in sodium chloride  0.9 % 250 mL IVPB (has no administration in time range)  cefTRIAXone  (ROCEPHIN ) 1 g in sodium chloride  0.9 % 100 mL IVPB (has no administration in time range)  apixaban  (ELIQUIS ) tablet 5 mg (5 mg  Oral Given 10/09/24 2129)  guaiFENesin-dextromethorphan (ROBITUSSIN DM) 100-10 MG/5ML syrup 5 mL (has no administration in time range)  busPIRone  (BUSPAR ) tablet 10 mg (10 mg Oral Given 10/09/24 2129)  clonazepam  (KLONOPIN ) disintegrating tablet 0.25 mg (0.25 mg Oral Given 10/09/24  2129)  escitalopram  (LEXAPRO ) tablet 20 mg (has no administration in time range)  donepezil  (ARICEPT ) tablet 10 mg (has no administration in time range)  pantoprazole  (PROTONIX ) EC tablet 40 mg (has no administration in time range)  furosemide (LASIX) tablet 40 mg (has no administration in time range)  mirabegron  ER (MYRBETRIQ ) tablet 50 mg (50 mg Oral Given 10/09/24 2129)  propranolol  (INDERAL ) tablet 10 mg (10 mg Oral Given 10/09/24 2128)  carbidopa -levodopa  (SINEMET  IR) 25-100 MG per tablet immediate release 1 tablet (1 tablet Oral Given 10/09/24 2129)  carbidopa -levodopa  (SINEMET  IR) 25-100 MG per tablet immediate release 3 tablet (has no administration in time range)  carbidopa -levodopa  (SINEMET  IR) 25-100 MG per tablet immediate release 2 tablet (has no administration in time range)  ipratropium-albuterol  (DUONEB) 0.5-2.5 (3) MG/3ML nebulizer solution 3 mL (has no administration in time range)  ipratropium-albuterol  (DUONEB) 0.5-2.5 (3) MG/3ML nebulizer solution 3 mL (3 mLs Nebulization Given 10/09/24 1145)  sodium chloride  0.9 % bolus 500 mL (0 mLs Intravenous Stopped 10/09/24 1412)  potassium chloride SA (KLOR-CON M) CR tablet 40 mEq (40 mEq Oral Given 10/09/24 1204)  iohexol  (OMNIPAQUE ) 350 MG/ML injection 75 mL (75 mLs Intravenous Contrast Given 10/09/24 1130)  acetaminophen  (TYLENOL ) tablet 650 mg (650 mg Oral Given 10/09/24 1211)  cefTRIAXone  (ROCEPHIN ) 1 g in sodium chloride  0.9 % 100 mL IVPB (0 g Intravenous Stopped 10/09/24 1329)  azithromycin (ZITHROMAX) 500 mg in sodium chloride  0.9 % 250 mL IVPB (500 mg Intravenous New Bag/Given 10/09/24 1402)                                                                                                                                      Procedures .Critical Care  Performed by: Elnor Jayson LABOR, DO Authorized by: Elnor Jayson LABOR, DO   Critical care provider statement:    Critical care time (minutes):  30   Critical care time was exclusive of:  Separately billable procedures and treating other patients   Critical care was necessary to treat or prevent imminent or life-threatening deterioration of the following conditions:  Sepsis   Critical care was time spent personally by me on the following activities:  Development of treatment plan with patient or surrogate, discussions with consultants, evaluation of patient's response to treatment, examination of patient, ordering and review of laboratory studies, ordering and review of radiographic studies, ordering and performing treatments and interventions, pulse oximetry, re-evaluation of patient's condition, review of old charts and obtaining history from patient or surrogate   Care discussed with: admitting provider     (including critical care time)  Medical Decision Making / ED Course    Medical Decision Making:    MARVA HENDRYX is a 83 y.o. female with past medical history as below, significant for DVT, GERD, hypertension, Parkinson's disease, dementia, PE who presents to the ED with complaint of chest pain, dyspnea, cough, malaise. The complaint involves an extensive differential  diagnosis and also carries with it a high risk of complications and morbidity.  Serious etiology was considered. Ddx includes but is not limited to: In my evaluation of this patient's dyspnea my DDx includes, but is not limited to, pneumonia, pulmonary embolism, pneumothorax, pulmonary edema, metabolic acidosis, asthma, COPD, cardiac cause, anemia, anxiety, etc.    Complete initial physical exam performed, notably the patient was in no acute distress, afebrile.    Reviewed and confirmed nursing documentation for past medical  history, family history, social history.  Vital signs reviewed.    CAP Sepsis > - Patient with cough, chills, body aches, dyspnea, chest pain over the last 2 days. - CT concerning for pneumonia, she has 2 SIRS with leukocytosis and tachypnea.  Concern sepsis.  Start Rocephin  and azithromycin. - Able to wean off supplemental oxygen , pt reports feeling somewhat better - given her age and concern for sepsis, recommend admission, pt/family agreeable - Plan admission   Clinical Course as of 10/10/24 0727  Tue Oct 09, 2024  1248 CT concerning for PNA, wbc 14, RR 22. Pt meets for sepsis. Start rocephin  / azithro, plan admit  [SG]    Clinical Course User Index [SG] Elnor Jayson LABOR, DO                    Additional history obtained: -Additional history obtained from family -External records from outside source obtained and reviewed including: Chart review including previous notes, labs, imaging, consultation notes including  Primary care documentation, medications from home   Lab Tests: -I ordered, reviewed, and interpreted labs.   The pertinent results include:   Labs Reviewed  CBC WITH DIFFERENTIAL/PLATELET - Abnormal; Notable for the following components:      Result Value   WBC 14.6 (*)    RBC 3.69 (*)    Hemoglobin 11.1 (*)    HCT 35.2 (*)    Neutro Abs 10.6 (*)    Monocytes Absolute 1.6 (*)    Abs Immature Granulocytes 0.08 (*)    All other components within normal limits  COMPREHENSIVE METABOLIC PANEL WITH GFR - Abnormal; Notable for the following components:   Potassium 3.4 (*)    Glucose, Bld 101 (*)    Calcium  8.7 (*)    All other components within normal limits  BASIC METABOLIC PANEL WITH GFR - Abnormal; Notable for the following components:   Glucose, Bld 105 (*)    Calcium  8.7 (*)    All other components within normal limits  CBC - Abnormal; Notable for the following components:   WBC 11.3 (*)    RBC 3.45 (*)    Hemoglobin 10.6 (*)    HCT 33.2  (*)    All other components within normal limits  RESP PANEL BY RT-PCR (RSV, FLU A&B, COVID)  RVPGX2  CULTURE, BLOOD (ROUTINE X 2)  MRSA NEXT GEN BY PCR, NASAL  CULTURE, BLOOD (ROUTINE X 2)  PRO BRAIN NATRIURETIC PEPTIDE  LACTIC ACID, PLASMA  LACTIC ACID, PLASMA  LEGIONELLA PNEUMOPHILA SEROGP 1 UR AG  STREP PNEUMONIAE URINARY ANTIGEN  TROPONIN T, HIGH SENSITIVITY  TROPONIN T, HIGH SENSITIVITY    Notable for wbc +, K low   EKG   EKG Interpretation Date/Time:  Tuesday October 09 2024 09:29:01 EDT Ventricular Rate:  66 PR Interval:  205 QRS Duration:  83 QT Interval:  519 QTC Calculation: 544 R Axis:   -11  Text Interpretation: Sinus rhythm Atrial premature complex Low voltage, precordial leads Abnormal R-wave progression, early transition  Borderline T abnormalities, diffuse leads Prolonged QT interval Confirmed by Elnor Savant ([id number]) on 10/09/2024 10:40:59 AM         Imaging Studies ordered: I ordered imaging studies including cxr, ctpe I independently visualized the following imaging with scope of interpretation limited to determining acute life threatening conditions related to emergency care; findings noted above I agree with the radiologist interpretation If any imaging was obtained with contrast I closely monitored patient for any possible adverse reaction a/w contrast administration in the emergency department   Medicines ordered and prescription drug management: Meds ordered this encounter  Medications   ipratropium-albuterol  (DUONEB) 0.5-2.5 (3) MG/3ML nebulizer solution 3 mL   sodium chloride  0.9 % bolus 500 mL   potassium chloride SA (KLOR-CON M) CR tablet 40 mEq   iohexol  (OMNIPAQUE ) 350 MG/ML injection 75 mL   acetaminophen  (TYLENOL ) tablet 650 mg   cefTRIAXone  (ROCEPHIN ) 1 g in sodium chloride  0.9 % 100 mL IVPB    Antibiotic Indication::   CAP   azithromycin (ZITHROMAX) 500 mg in sodium chloride  0.9 % 250 mL IVPB    Antibiotic Indication::   CAP   OR  Linked Order Group    acetaminophen  (TYLENOL ) tablet 650 mg    acetaminophen  (TYLENOL ) suppository 650 mg   traZODone (DESYREL) tablet 25 mg   OR Linked Order Group    ondansetron  (ZOFRAN ) tablet 4 mg    ondansetron  (ZOFRAN ) injection 4 mg   albuterol  (PROVENTIL ) (2.5 MG/3ML) 0.083% nebulizer solution 2.5 mg   azithromycin (ZITHROMAX) 500 mg in sodium chloride  0.9 % 250 mL IVPB    Antibiotic Indication::   CAP   cefTRIAXone  (ROCEPHIN ) 1 g in sodium chloride  0.9 % 100 mL IVPB    Antibiotic Indication::   CAP   apixaban  (ELIQUIS ) tablet 5 mg   guaiFENesin-dextromethorphan (ROBITUSSIN DM) 100-10 MG/5ML syrup 5 mL   busPIRone  (BUSPAR ) tablet 10 mg   DISCONTD: carbidopa -levodopa  (SINEMET  IR) 25-100 MG per tablet immediate release 1-3 tablet    OP DPH:UJXZ 2 TABLETS BY MOUTH DAILY AT 9 A.M. THEN 2 TABLETS AT 1 P.M. THEN 1 TABLET AT 5 P.M. AS DIRECTED Patient taking differently: Take 2 tablets by mouth every morning, 3 tablets during the day, and 1 tablet at bedtime.     clonazepam  (KLONOPIN ) disintegrating tablet 0.25 mg   escitalopram  (LEXAPRO ) tablet 20 mg   donepezil  (ARICEPT ) tablet 10 mg   pantoprazole  (PROTONIX ) EC tablet 40 mg   furosemide (LASIX) tablet 40 mg   mirabegron  ER (MYRBETRIQ ) tablet 50 mg   propranolol  (INDERAL ) tablet 10 mg   DISCONTD: carbidopa -levodopa  (SINEMET  IR) 25-100 MG per tablet immediate release 2 tablet   DISCONTD: carbidopa -levodopa  (SINEMET  IR) 25-100 MG per tablet immediate release 3 tablet   DISCONTD: carbidopa -levodopa  (SINEMET  IR) 25-100 MG per tablet immediate release 1 tablet   carbidopa -levodopa  (SINEMET  IR) 25-100 MG per tablet immediate release 1 tablet   carbidopa -levodopa  (SINEMET  IR) 25-100 MG per tablet immediate release 3 tablet   carbidopa -levodopa  (SINEMET  IR) 25-100 MG per tablet immediate release 2 tablet   ipratropium-albuterol  (DUONEB) 0.5-2.5 (3) MG/3ML nebulizer solution 3 mL    -I have reviewed the patients home medicines and have  made adjustments as needed   Consultations Obtained: I requested consultation with the hospitalist,  and discussed lab and imaging findings as well as pertinent plan    Cardiac Monitoring: The patient was maintained on a cardiac monitor.  I personally viewed and interpreted the cardiac monitored which showed an underlying rhythm of: nsr Continuous  pulse oximetry interpreted by myself, 95% on 2Lnc.    Social Determinants of Health:  Diagnosis or treatment significantly limited by social determinants of health: former smoker and obesity   Reevaluation: After the interventions noted above, I reevaluated the patient and found that they have improved  Co morbidities that complicate the patient evaluation  Past Medical History:  Diagnosis Date   Arthritis    arthritis in joints and back   Benign familial tremor    Clostridium difficile infection    DDD (degenerative disc disease), lumbar    Diverticulosis    DVT (deep venous thrombosis) (HCC)    left leg   Dyspnea    Gallstones    GERD (gastroesophageal reflux disease)    Hypertension    Neuromuscular disorder (HCC)    Parkinson's   Osteoporosis    Sleep apnea    Wears glasses       Dispostion: Disposition decision including need for hospitalization was considered, and patient admitted to the hospital.    Final Clinical Impression(s) / ED Diagnoses Final diagnoses:  Community acquired pneumonia, unspecified laterality  Sepsis, due to unspecified organism, unspecified whether acute organ dysfunction present (HCC)  Hypokalemia        Elnor Jayson LABOR, DO 10/10/24 [phone]

## 2024-10-09 NOTE — ED Notes (Signed)
 Patient transported to CT

## 2024-10-09 NOTE — H&P (Signed)
 History and Physical  [name] FMW:[id number] DOB: 02/22/1941 DOA: 10/09/2024  PCP: Loreli Elsie JONETTA Mickey., MD   Chief Complaint: Cough  HPI: [name] is a 83 y.o. female with medical history significant for DVT on Eliquis , hypertension, GERD, Parkinson's being admitted to the hospital with sepsis due to multifocal community-acquired pneumonia.  Patient states she was in her usual state of health until a couple of days ago, when she started to have a nonproductive cough.  Denies any chest pain, fevers, nausea, vomiting decided to come to the ER for evaluation today due to continuous cough.  Tachypneic on arrival, has been stable on room air.  Workup as detailed below shows that she meets sepsis criteria, CT scan of the chest shows multifocal pneumonia.  Given empiric IV antibiotics, and hospitalist admission was requested.  Review of Systems: Please see HPI for pertinent positives and negatives. A complete 10 system review of systems are otherwise negative.  Past Medical History:  Diagnosis Date   Arthritis    arthritis in joints and back   Benign familial tremor    Clostridium difficile infection    DDD (degenerative disc disease), lumbar    Diverticulosis    DVT (deep venous thrombosis) (HCC)    left leg   Dyspnea    Gallstones    GERD (gastroesophageal reflux disease)    Hypertension    Neuromuscular disorder (HCC)    Parkinson's   Osteoporosis    Sleep apnea    Wears glasses    Past Surgical History:  Procedure Laterality Date   ABDOMINAL HYSTERECTOMY  1983   arterial vascular fistula  2004   coiled at [facility]   BLADDER REPAIR  [phone]   CARPAL TUNNEL RELEASE  right   CHOLECYSTECTOMY N/A 03/29/2013   Procedure: LAPAROSCOPIC CHOLECYSTECTOMY WITH INTRAOPERATIVE CHOLANGIOGRAM;  Surgeon: Sherlean JINNY Laughter, MD;  Location: [facility];  Service: General;  Laterality: N/A;   CYSTOSCOPY N/A 05/15/2024   Procedure: PHYLLIS;  Surgeon: Gaston Hamilton,  MD;  Location: [facility] OR;  Service: Urology;  Laterality: N/A;   CYSTOSCOPY WITH INJECTION N/A 11/01/2023   Procedure: CYSTOSCOPY WITH INJECTION BULKAMID INJECTION;  Surgeon: Gaston Hamilton, MD;  Location: [facility];  Service: Urology;  Laterality: N/A;  30 MINS FOR CASE   EUS N/A 03/09/2013   Procedure: UPPER ENDOSCOPIC ULTRASOUND (EUS) LINEAR;  Surgeon: Belvie JONETTA Just, MD;  Location: [facility];  Service: Endoscopy;  Laterality: N/A;   INJECTION, BULKING AGENT, URETHRA N/A 05/15/2024   Procedure: INJECTION, BULKING AGENT, URETHRA;  Surgeon: Gaston Hamilton, MD;  Location: [facility] OR;  Service: Urology;  Laterality: N/A;  BULKAMID   LUMBAR LAMINECTOMY/DECOMPRESSION MICRODISCECTOMY  11/30/2012   Procedure: LUMBAR LAMINECTOMY/DECOMPRESSION MICRODISCECTOMY 2 LEVELS;  Surgeon: Lynwood SHAUNNA Bern, MD;  Location: [facility];  Service: Orthopedics;  Laterality: N/A;  DECOMPRESSION LAMINECTOMY L4-L5, L5-S1 CENTRAL    ROTATOR CUFF REPAIR Bilateral 2004   two on right, one on left   TOTAL KNEE ARTHROPLASTY Left 06/06/2020   Procedure: LEFT TOTAL KNEE ARTHROPLASTY;  Surgeon: Vernetta Lonni GRADE, MD;  Location: [facility];  Service: Orthopedics;  Laterality: Left;   TRIGGER FINGER RELEASE Right     hand   Social History:  reports that she quit smoking about 54 years ago. Her smoking use included cigarettes. She has never used smokeless tobacco. She reports current alcohol  use. She reports that she does not use drugs.  Allergies  Allergen Reactions   Buprenorphine Hcl Nausea And Vomiting   Codeine Nausea  And Vomiting    But tolerates Tramadol     Morphine Nausea And Vomiting   Procaine     Novacaine-Shakes    Family History  Problem Relation Age of Onset   Other Mother        sarcoma   Other Father        broken hip   Dementia Father    Pneumonia Father    Sleep apnea Neg Hx      Prior to Admission medications   Medication Sig Start Date End Date Taking? Authorizing Provider  albuterol  (VENTOLIN  HFA)  108 (90 Base) MCG/ACT inhaler Inhale 2 puffs into the lungs every 6 (six) hours as needed for wheezing or shortness of breath. 06/21/22   [provider]  apixaban  (ELIQUIS ) 5 MG TABS tablet Take 1 tablet (5 mg total) by mouth 2 (two) times daily. 06/10/20   Vernetta Lonni GRADE, MD  busPIRone  (BUSPAR ) 10 MG tablet Take 10 mg by mouth 2 (two) times daily.  05/17/18   [provider]  carbidopa -levodopa  (SINEMET  IR) 25-100 MG tablet TAKE 2 TABLETS BY MOUTH DAILY AT 9 A.M. THEN 2 TABLETS AT 1 P.M. THEN 1 TABLET AT 5 P.M. AS DIRECTED 09/10/24   Tat, Asberry RAMAN, DO  cholecalciferol  (VITAMIN D ) 25 MCG (1000 UNIT) tablet Take 1,000 Units by mouth daily.    [provider]  clonazePAM  (KLONOPIN ) 0.5 MG tablet Take 0.5 tablets (0.25 mg total) by mouth at bedtime. 03/29/24   Tat, Asberry RAMAN, DO  donepezil  (ARICEPT ) 10 MG tablet Take 10 mg by mouth in the morning.    [provider]  escitalopram  (LEXAPRO ) 20 MG tablet Take 20 mg by mouth daily.    [provider]  esomeprazole (NEXIUM) 40 MG capsule Take 40 mg by mouth 2 (two) times daily before a meal.  10/01/15   [provider]  furosemide (LASIX) 40 MG tablet Take 40 mg by mouth daily.    [provider]  ipratropium (ATROVENT) 0.06 % nasal spray Place 2 sprays into both nostrils 2 (two) times daily. 10/05/24   [provider]  loratadine  (CLARITIN ) 10 MG tablet Take 10 mg by mouth at bedtime.    [provider]  mirabegron  ER (MYRBETRIQ ) 50 MG TB24 tablet Take 50 mg by mouth at bedtime.  02/13/16   [provider]  nitrofurantoin  (MACRODANTIN ) 100 MG capsule Take 100 mg by mouth daily. 09/27/24   [provider]  nitrofurantoin , macrocrystal-monohydrate, (MACROBID ) 100 MG capsule Take 100 mg by mouth in the morning. 10/19/23   [provider]  NON FORMULARY Pt uses a cpap nightly    [provider]  olmesartan (BENICAR) 20 MG tablet Take 20 mg by  mouth in the morning and at bedtime.     [provider]  propranolol  (INDERAL ) 10 MG tablet TAKE 1 TABLET BY MOUTH 2 TIMES A DAY 09/10/24   Tat, Asberry RAMAN, DO  saccharomyces boulardii (FLORASTOR) 250 MG capsule Take 250 mg by mouth 2 (two) times daily.    [provider]  vitamin B-12 (CYANOCOBALAMIN ) 1000 MCG tablet Take 1,000 mcg by mouth daily.    [provider]  carvedilol (COREG) 6.25 MG tablet  03/07/19 01/09/20  [provider]    Physical Exam: BP (!) 140/57   Pulse 69   Temp (!) 97.1 F (36.2 C) (Oral)   Resp 17   SpO2 93%  General:  Alert, oriented, calm, in no acute distress, speaking in full sentences,  intermittent nonproductive cough Cardiovascular: RRR, no murmurs or rubs, no peripheral edema  Respiratory: clear to auscultation bilaterally, no wheezes, no crackles  Abdomen: soft, nontender, nondistended, normal bowel tones heard  Skin: dry, no rashes  Musculoskeletal: no joint effusions, normal range of motion  Psychiatric: appropriate affect, normal speech  Neurologic: extraocular muscles intact, clear speech, moving all extremities with intact sensorium         Labs on Admission:  Basic Metabolic Panel: Recent Labs  Lab 10/09/24 0913  NA 137  K 3.4*  CL 101  CO2 26  GLUCOSE 101*  BUN 17  CREATININE 0.80  CALCIUM  8.7*   Liver Function Tests: Recent Labs  Lab 10/09/24 0913  AST 16  ALT <5  ALKPHOS 91  BILITOT 0.4  PROT 7.2  ALBUMIN 3.7   No results for input(s): LIPASE, AMYLASE in the last 168 hours. No results for input(s): AMMONIA in the last 168 hours. CBC: Recent Labs  Lab 10/09/24 0913  WBC 14.6*  NEUTROABS 10.6*  HGB 11.1*  HCT 35.2*  MCV 95.4  PLT 195   Cardiac Enzymes: No results for input(s): CKTOTAL, CKMB, CKMBINDEX, TROPONINI in the last 168 hours. BNP (last 3 results) No results for input(s): BNP in the last 8760 hours.  ProBNP (last 3 results) Recent Labs     10/09/24 0913  PROBNP 251.0    CBG: No results for input(s): GLUCAP in the last 168 hours.  Radiological Exams on Admission: CT Angio Chest PE W and/or Wo Contrast Result Date: 10/09/2024 CLINICAL DATA:  Pulmonary embolism (PE) suspected, high prob Shortness of breath with productive cough for 2 days. EXAM: CT ANGIOGRAPHY CHEST WITH CONTRAST TECHNIQUE: Multidetector CT imaging of the chest was performed using the standard protocol during bolus administration of intravenous contrast. Multiplanar CT image reconstructions and MIPs were obtained to evaluate the vascular anatomy. RADIATION DOSE REDUCTION: This exam was performed according to the departmental dose-optimization program which includes automated exposure control, adjustment of the mA and/or kV according to patient size and/or use of iterative reconstruction technique. CONTRAST:  75mL OMNIPAQUE  IOHEXOL  350 MG/ML SOLN COMPARISON:  Same day radiographs.  Chest CTA 05/18/2018. FINDINGS: Cardiovascular: The pulmonary arteries are well opacified with contrast to the level of the segmental branches. There is no evidence of acute pulmonary embolism. There is central enlargement of the pulmonary arteries consistent with pulmonary arterial hypertension. Atherosclerosis of the aorta, great vessels and coronary arteries. No acute systemic arterial abnormalities are identified. There are calcifications of the aortic valve. The heart is mildly enlarged. No significant pericardial fluid. Mediastinum/Nodes: There are no enlarged mediastinal, hilar or axillary lymph nodes. The thyroid  gland, trachea and esophagus demonstrate no significant findings. Lungs/Pleura: No pleural effusion or pneumothorax. Central airway thickening with new multifocal patchy ground-glass opacities in both lungs. No confluent airspace disease or suspicious nodularity. Upper abdomen: No significant findings are seen in the visualized upper abdomen status post cholecystectomy.  Musculoskeletal/Chest wall: There is no chest wall mass or suspicious osseous finding. Mild multilevel spondylosis. Postsurgical changes at the right shoulder. Review of the MIP images confirms the above findings. IMPRESSION: 1. No evidence of acute pulmonary embolism or other acute vascular findings in the chest. 2. Central airway thickening with new multifocal patchy ground-glass opacities in both lungs, likely infectious or inflammatory. No confluent airspace disease or suspicious nodularity. Recommend clinical correlation and radiographic follow-up. 3. Central enlargement of the pulmonary arteries consistent with pulmonary arterial hypertension. 4.  Aortic Atherosclerosis (ICD10-I70.0). Electronically Signed   By:  Elsie Perone M.D.   On: 10/09/2024 12:39   DG Chest Port 1 View Result Date: 10/09/2024 EXAM: 1 VIEW(S) XRAY OF THE CHEST 10/09/2024 09:46:00 AM COMPARISON: Comparison in 06/19/2022. CLINICAL HISTORY: Pt has complaints of shortness of breath with a productive cough X2 days. FINDINGS: LUNGS AND PLEURA: No focal pulmonary opacity. HEART AND MEDIASTINUM: Cardiac shadow is enlarged but stable. Tortuous thoracic aorta. Aortic atherosclerotic calcifications. BONES AND SOFT TISSUES: No acute osseous abnormality. IMPRESSION: 1. No acute cardiopulmonary process. 2. Borderline cardiomegaly, unchanged from prior. Electronically signed by: Oneil Devonshire MD 10/09/2024 10:08 AM EDT RP Workstation: [id number]   Assessment/Plan [name] is a 83 y.o. female with medical history significant for DVT on Eliquis , hypertension, GERD, Parkinson's being admitted to the hospital with sepsis due to multifocal community-acquired pneumonia.  Sepsis-meeting criteria with tachypnea, leukocytosis, source is community-acquired multifocal pneumonia.  No evidence of endorgan dysfunction. -Inpatient admission -Empiric IV azithromycin and IV Rocephin  -Follow-up blood cultures -Check lactic acid -Check Legionella  and strep pneumo urinary antigens -Robitussin as needed for cough  Parkinson's-Sinemet , Aricept , Klonopin  at bedtime  History of DVT-continue Eliquis  twice daily  Hypertension-Benicar    Code Status: Full Code  Consults called: None  Admission status: The appropriate patient status for this patient is INPATIENT. Inpatient status is judged to be reasonable and necessary in order to provide the required intensity of service to ensure the patient's safety. The patient's presenting symptoms, physical exam findings, and initial radiographic and laboratory data in the context of their chronic comorbidities is felt to place them at high risk for further clinical deterioration. Furthermore, it is not anticipated that the patient will be medically stable for discharge from the hospital within 2 midnights of admission.    I certify that at the point of admission it is my clinical judgment that the patient will require inpatient hospital care spanning beyond 2 midnights from the point of admission due to high intensity of service, high risk for further deterioration and high frequency of surveillance required  Time spent: 59 minutes  Jalonda Antigua CHRISTELLA Gail MD [facility] Pager [phone]  If 7PM-7AM, please contact night-coverage [email or web address] Password [email or web address]  10/09/2024, 2:29 PM

## 2024-10-10 DIAGNOSIS — A419 Sepsis, unspecified organism: Secondary | ICD-10-CM | POA: Diagnosis not present

## 2024-10-10 DIAGNOSIS — J189 Pneumonia, unspecified organism: Secondary | ICD-10-CM | POA: Diagnosis not present

## 2024-10-10 LAB — CBC
HCT: 33.2 % — ABNORMAL LOW (ref 36.0–46.0)
Hemoglobin: 10.6 g/dL — ABNORMAL LOW (ref 12.0–15.0)
MCH: 30.7 pg (ref 26.0–34.0)
MCHC: 31.9 g/dL (ref 30.0–36.0)
MCV: 96.2 fL (ref 80.0–100.0)
Platelets: 205 K/uL (ref 150–400)
RBC: 3.45 MIL/uL — ABNORMAL LOW (ref 3.87–5.11)
RDW: 13.8 % (ref 11.5–15.5)
WBC: 11.3 K/uL — ABNORMAL HIGH (ref 4.0–10.5)
nRBC: 0 % (ref 0.0–0.2)

## 2024-10-10 LAB — BASIC METABOLIC PANEL WITH GFR
Anion gap: 10 (ref 5–15)
BUN: 14 mg/dL (ref 8–23)
CO2: 25 mmol/L (ref 22–32)
Calcium: 8.7 mg/dL — ABNORMAL LOW (ref 8.9–10.3)
Chloride: 104 mmol/L (ref 98–111)
Creatinine, Ser: 0.68 mg/dL (ref 0.44–1.00)
GFR, Estimated: >60 mL/min (ref >60)
Glucose, Bld: 105 mg/dL — ABNORMAL HIGH (ref 70–99)
Potassium: 3.6 mmol/L (ref 3.5–5.1)
Sodium: 139 mmol/L (ref 135–145)

## 2024-10-10 MED ORDER — IRBESARTAN 150 MG PO TABS
150.0000 mg | ORAL_TABLET | Freq: Every day | ORAL | Status: DC
  Administered 2024-10-10 – 2024-10-11 (×2): 150 mg via ORAL
  Filled 2024-10-10 (×2): qty 1

## 2024-10-10 MED ORDER — IPRATROPIUM-ALBUTEROL 0.5-2.5 (3) MG/3ML IN SOLN
3.0000 mL | Freq: Three times a day (TID) | RESPIRATORY_TRACT | Status: DC
  Administered 2024-10-10 – 2024-10-11 (×4): 3 mL via RESPIRATORY_TRACT
  Filled 2024-10-10 (×5): qty 3

## 2024-10-10 MED ORDER — SODIUM CHLORIDE 0.9 % IV SOLN
2.0000 g | INTRAVENOUS | Status: DC
  Administered 2024-10-11: 2 g via INTRAVENOUS
  Filled 2024-10-10: qty 20

## 2024-10-10 NOTE — TOC Initial Note (Signed)
 Transition of Care [facility]) - Initial/Assessment Note    Patient Details  Name: [name] MRN: [id number] Date of Birth: 02/14/1941  [facility] of [facility]) CM/SW Contact:    Heather DELENA Saltness, LCSW Phone Number: 10/10/2024, 1:45 PM  Clinical Narrative:                 CSW met with pt at bedside to discuss discharge planning. Pt reports she resides at [facility] in their ILF. Pt reports family can transport her upon discharge. PT evaluation pending. TOC will continue to follow.    Expected Discharge Plan: Home/Self Care Barriers to Discharge: Continued Medical Work up   Patient Goals and CMS Choice Patient states their goals for this hospitalization and ongoing recovery are:: To return to [facility]        Expected Discharge Plan and Services In-house Referral: Clinical Social Work Discharge Planning Services: NA Post Acute Care Choice: NA Living arrangements for the past 2 months: Independent Living Facility                 DME Arranged: N/A DME Agency: NA       HH Arranged: NA HH Agency: NA        Prior Living Arrangements/Services Living arrangements for the past 2 months: Independent Living Facility Lives with:: Self Patient language and need for interpreter reviewed:: Yes Do you feel safe going back to the place where you live?: Yes      Need for Family Participation in Patient Care: Yes (Comment) Care giver support system in place?: Yes (comment)   Criminal Activity/Legal Involvement Pertinent to Current Situation/Hospitalization: No - Comment as needed  Activities of Daily Living   ADL Screening (condition at time of admission) Independently performs ADLs?: Yes (appropriate for developmental age) Is the patient deaf or have difficulty hearing?: No Does the patient have difficulty seeing, even when wearing glasses/contacts?: No Does the patient have difficulty concentrating, remembering, or making decisions?: No  Permission  Sought/Granted Permission sought to share information with : Case Manager, Family Supports Permission granted to share information with : Yes, Verbal Permission Granted  Share Information with NAME: Percell Rao  Permission granted to share info w AGENCY: SNF / [facility]  Permission granted to share info w Relationship: daughter  Permission granted to share info w Contact Information: [phone]  Emotional Assessment Appearance:: Appears stated age, Well-Groomed Attitude/Demeanor/Rapport: Engaged Affect (typically observed): Stable, Pleasant Orientation: : Oriented to Self, Oriented to Place, Oriented to  Time, Oriented to Situation Alcohol  / Substance Use: Not Applicable Psych Involvement: No (comment)  Admission diagnosis:  Hypokalemia [E87.6] Sepsis due to pneumonia (HCC) [J18.9, A41.9] Community acquired pneumonia, unspecified laterality [J18.9] Sepsis, due to unspecified organism, unspecified whether acute organ dysfunction present [facility]) [A41.9] Patient Active Problem List   Diagnosis Date Noted   Sepsis due to pneumonia (HCC) 10/09/2024   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/06/2020   Unilateral primary osteoarthritis, left knee 02/21/2020   Depression 05/25/2019   Skin cancer 05/25/2019   Metatarsalgia of right foot 08/04/2018   Pulmonary embolus (HCC) 05/19/2018   Pulmonary embolism (HCC) 05/18/2018   HTN (hypertension) 05/18/2018   Lumbar radiculopathy 11/13/2016   Hallux rigidus of right foot 05/19/2016   Onychomycosis of left great toe 05/19/2016   Gait disturbance 04/05/2016   Lumbar spinal stenosis 02/20/2016   Claudication 02/03/2016   Ectropion of both eyes 04/07/2015   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   Vitreous syneresis of both eyes 04/07/2015   Sleep  apnea, obstructive 07/29/2014   Acute laryngitis 07/24/2014   Allergic rhinitis 07/24/2014   Hearing loss 07/24/2014   Hip osteoarthritis 07/24/2014   Hoarseness 07/24/2014   Lumbar degenerative  disc disease 07/24/2014   Lumbar spondylosis 07/24/2014   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 [place] [address] Phone: [phone] Fax: [phone]   Social Drivers of Health (SDOH) Social History: SDOH Screenings   Food Insecurity: No Food Insecurity (10/09/2024)  Housing: Low Risk  (10/09/2024)  Transportation Needs: No Transportation Needs (10/09/2024)  Utilities: Not At Risk (10/09/2024)  Social Connections: Moderately Isolated (10/09/2024)  Tobacco Use: Medium Risk (10/09/2024)   SDOH Interventions: None indicated     Readmission Risk Interventions    10/10/2024   12:54 PM  Readmission Risk Prevention Plan  Transportation Screening Complete  PCP or Specialist Appt within 5-7 Days Complete  Home Care Screening Complete  Medication Review (RN CM) Complete    Signed: Heather Saltness, MSW, LCSW Clinical Social Worker Inpatient Care Management 10/10/2024 1:46 PM

## 2024-10-10 NOTE — Plan of Care (Signed)

## 2024-10-10 NOTE — Progress Notes (Signed)
 Mobility Specialist Progress Note:   10/10/24 1057  Mobility  Activity Ambulated with assistance  Level of Assistance Minimal assist, patient does 75% or more  Assistive Device Front wheel walker  Distance Ambulated (ft) 20 ft  Activity Response Tolerated well  Mobility Referral Yes  Mobility visit 1 Mobility  Mobility Specialist Start Time (ACUTE ONLY) 1015  Mobility Specialist Stop Time (ACUTE ONLY) 1045  Mobility Specialist Time Calculation (min) (ACUTE ONLY) 30 min   Pt was received in bed and agreed to mobility. Min A sit to stand. Pt ambulated from bed to bathroom and then returned to recliner. Pt needs were all met, call bell in reach.  [employer] - Mobility Specialist

## 2024-10-10 NOTE — Evaluation (Signed)
 Physical Therapy Evaluation Patient Details Name: [name] MRN: [id number] DOB: 04/22/1941 Today's Date: 10/10/2024  History of Present Illness  83 y.o. female admitted to the hospital with sepsis due to multifocal community-acquired pneumonia. Pt with medical history significant for DVT on Eliquis , hypertension, GERD, Parkinson's being admitted to the hospital with sepsis due to multifocal community-acquired pneumonia.  Clinical Impression  Pt admitted with above diagnosis. Pt ambulated 100' with RW, no loss of balance, SpO2 94% on room air while walking. At baseline she ambulates with a rollator independently at her ILF, uses a motorized scooter for longer distances. She has private duty caregivers 6 days/week to assist with bathing. From a PT standpoint she is ready to DC back to her ILF with HHPT.  Pt currently with functional limitations due to the deficits listed below (see PT Problem List). Pt will benefit from acute skilled PT to increase their independence and safety with mobility to allow discharge.           If plan is discharge home, recommend the following: A little help with bathing/dressing/bathroom   Can travel by private vehicle        Equipment Recommendations None recommended by PT  Recommendations for Other Services       Functional Status Assessment Patient has had a recent decline in their functional status and demonstrates the ability to make significant improvements in function in a reasonable and predictable amount of time.     Precautions / Restrictions Precautions Precautions: Fall Recall of Precautions/Restrictions: Intact Precaution/Restrictions Comments: 1 fall in past 6 months Restrictions Weight Bearing Restrictions Per Provider Order: No      Mobility  Bed Mobility               General bed mobility comments: up in recliner    Transfers Overall transfer level: Needs assistance Equipment used: Rolling walker (2  wheels) Transfers: Sit to/from Stand Sit to Stand: Contact guard assist           General transfer comment: VCs for hand placement    Ambulation/Gait Ambulation/Gait assistance: Contact guard assist Gait Distance (Feet): 100 Feet Assistive device: Rolling walker (2 wheels) Gait Pattern/deviations: Step-through pattern, Decreased stride length Gait velocity: WFL     General Gait Details: steady with RW, no loss of balance, SpO2 94% on room air walking, 1/4 dyspnea  Stairs            Wheelchair Mobility     Tilt Bed    Modified Rankin (Stroke Patients Only)       Balance Overall balance assessment: History of Falls, Mild deficits observed, not formally tested                                           Pertinent Vitals/Pain Pain Assessment Pain Assessment: No/denies pain    Home Living Family/patient expects to be discharged to:: Private residence Living Arrangements: Alone Available Help at Discharge: Personal care attendant Type of Home: Independent living facility         Home Layout: One level Home Equipment: Rollator (4 wheels);Electric scooter;Wheelchair - manual;Shower seat;Hand held shower head;Grab bars - tub/shower;Grab bars - toilet Additional Comments: private aide 6x/week    Prior Function Prior Level of Function : Needs assist             Mobility Comments: walks with rollator, uses motorized scooter for long distances;  1 fall (reaching for light switch)  in past 6 months ADLs Comments: private CG provides stand by assist for bathing     Extremity/Trunk Assessment   Upper Extremity Assessment Upper Extremity Assessment: Overall WFL for tasks assessed    Lower Extremity Assessment Lower Extremity Assessment: Overall WFL for tasks assessed    Cervical / Trunk Assessment Cervical / Trunk Assessment: Normal  Communication   Communication Communication: No apparent difficulties    Cognition Arousal:  Alert Behavior During Therapy: WFL for tasks assessed/performed   PT - Cognitive impairments: No apparent impairments                         Following commands: Intact       Cueing Cueing Techniques: Verbal cues     General Comments      Exercises     Assessment/Plan    PT Assessment Patient needs continued PT services  PT Problem List Decreased activity tolerance;Decreased mobility       PT Treatment Interventions Gait training;Therapeutic exercise;Functional mobility training    PT Goals (Current goals can be found in the Care Plan section)  Acute Rehab PT Goals Patient Stated Goal: playing card games at her ILF PT Goal Formulation: With patient/family Time For Goal Achievement: 10/24/24 Potential to Achieve Goals: Good    Frequency Min 3X/week     Co-evaluation               AM-PAC PT 6 Clicks Mobility  Outcome Measure Help needed turning from your back to your side while in a flat bed without using bedrails?: None Help needed moving from lying on your back to sitting on the side of a flat bed without using bedrails?: A Little Help needed moving to and from a bed to a chair (including a wheelchair)?: None Help needed standing up from a chair using your arms (e.g., wheelchair or bedside chair)?: None Help needed to walk in hospital room?: None Help needed climbing 3-5 steps with a railing? : A Little 6 Click Score: 22    End of Session Equipment Utilized During Treatment: Gait belt Activity Tolerance: Patient tolerated treatment well Patient left: in chair;with chair alarm set;with family/visitor present;with call bell/phone within reach Nurse Communication: Mobility status PT Visit Diagnosis: Difficulty in walking, not elsewhere classified (R26.2)    Time: 8698-8680 PT Time Calculation (min) (ACUTE ONLY): 18 min   Charges:   PT Evaluation $PT Eval Moderate Complexity: 1 Mod   PT General Charges $$ ACUTE PT VISIT: 1 Visit          Sylvan Delon Copp PT 10/10/2024  Acute Rehabilitation Services  Office [phone]

## 2024-10-10 NOTE — Progress Notes (Signed)
 PROGRESS NOTE  [name]  FMW:[id number] DOB: 30-May-1941 DOA: 10/09/2024 PCP: Loreli Elsie JONETTA Mickey., MD   Brief Narrative: Patient is a 83 year old female with history of DVT on Eliquis , hypertension, GERD, Parkinson disease who presented with complaint of cough.  She was tachypnea on arrival but was on room air.  CT of the chest showed multifocal pneumonia.  Started on broad spectrum antibiotics.  Culture sent.  Clinically better.  PT consulted  Assessment & Plan:  Principal Problem:   Sepsis due to pneumonia (HCC)   Sepsis secondary to pneumonia: Met sepsis criteria with tachypnea, leukocytosis.  Secondary to pneumonia.  Continue current antibiotics.  Follow-up cultures.  So far negative.  Afebrile.  Multifocal pneumonia: Chest x-ray did not show any acute findings.  CT angio chest showed central airway thickening with new multifocal patchy ground-glass opacities in both lungs, likely infectious or inflammatory. No confluent airspace disease or suspicious nodularity.  Continue current antibiotics.  Currently she is on room air.  Leukocytosis improving  History of Parkinson disease: Sinemet , Aricept , Klonopin   History of DVT: On Eliquis   Hypertension: Takes Benicar at home.Restarted ARB  Debility/deconditioning: Patient lives at ILF.  Consulted PT        DVT prophylaxis:apixaban  (ELIQUIS ) tablet 5 mg Start: 10/09/24 2200 apixaban  (ELIQUIS ) tablet 5 mg     Code Status: Full Code  Family Communication: Discussed with daughter at bedside  Patient status:Inpatient  Patient is from : ILF  Anticipated discharge to: ILF  Estimated DC date:1-2 days   Consultants: None  Procedures:None  Antimicrobials:  Anti-infectives (From admission, onward)    Start     Dose/Rate Route Frequency Ordered Stop   10/10/24 1000  azithromycin (ZITHROMAX) 500 mg in sodium chloride  0.9 % 250 mL IVPB        500 mg 250 mL/hr over 60 Minutes Intravenous Every 24 hours 10/09/24 1415      10/10/24 1000  cefTRIAXone  (ROCEPHIN ) 1 g in sodium chloride  0.9 % 100 mL IVPB        1 g 200 mL/hr over 30 Minutes Intravenous Every 24 hours 10/09/24 1415     10/09/24 1300  cefTRIAXone  (ROCEPHIN ) 1 g in sodium chloride  0.9 % 100 mL IVPB        1 g 200 mL/hr over 30 Minutes Intravenous  Once 10/09/24 1247 10/09/24 1329   10/09/24 1300  azithromycin (ZITHROMAX) 500 mg in sodium chloride  0.9 % 250 mL IVPB        500 mg 250 mL/hr over 60 Minutes Intravenous  Once 10/09/24 1247 10/09/24 1502       Subjective: Patient seen and examined at bedside today.  Hemodynamically stable.  Sitting in the chair.  On room air.  Denies any worsening shortness of breath.  Still having some cough but it is getting better.  Eager to go back to her ILF.  Daughter at bedside  Objective: Vitals:   10/09/24 2027 10/09/24 2339 10/10/24 0416 10/10/24 0442  BP: (!) 162/72 (!) 163/75 (!) 154/82   Pulse: 79 84 73   Resp: 19 17 18    Temp: 98.8 F (37.1 C) 99.5 F (37.5 C) 98.2 F (36.8 C)   TempSrc:      SpO2: 95% 91% 96% 96%  Height:        Intake/Output Summary (Last 24 hours) at 10/10/2024 1117 Last data filed at 10/09/2024 1412 Gross per 24 hour  Intake 589.38 ml  Output --  Net 589.38 ml   There were no vitals  filed for this visit.  Examination:  General exam: Overall comfortable, not in distress, pleasant elderly female HEENT: PERRL Respiratory system:  no wheezes or crackles  Cardiovascular system: S1 & S2 heard, RRR.  Gastrointestinal system: Abdomen is nondistended, soft and nontender. Central nervous system: Alert and oriented Extremities: No edema, no clubbing ,no cyanosis Skin: No rashes, no ulcers,no icterus     Data Reviewed: I have personally reviewed following labs and imaging studies  CBC: Recent Labs  Lab 10/09/24 0913 10/10/24 0605  WBC 14.6* 11.3*  NEUTROABS 10.6*  --   HGB 11.1* 10.6*  HCT 35.2* 33.2*  MCV 95.4 96.2  PLT 195 205   Basic Metabolic  Panel: Recent Labs  Lab 10/09/24 0913 10/10/24 0605  NA 137 139  K 3.4* 3.6  CL 101 104  CO2 26 25  GLUCOSE 101* 105*  BUN 17 14  CREATININE 0.80 0.68  CALCIUM  8.7* 8.7*     Recent Results (from the past 240 hours)  Resp panel by RT-PCR (RSV, Flu A&B, Covid) Anterior Nasal Swab     Status: None   Collection Time: 10/09/24  9:26 AM   Specimen: Anterior Nasal Swab  Result Value Ref Range Status   SARS Coronavirus 2 by RT PCR NEGATIVE NEGATIVE Final    Comment: (NOTE) SARS-CoV-2 target nucleic acids are NOT DETECTED.  The SARS-CoV-2 RNA is generally detectable in upper respiratory specimens during the acute phase of infection. The lowest concentration of SARS-CoV-2 viral copies this assay can detect is 138 copies/mL. A negative result does not preclude SARS-Cov-2 infection and should not be used as the sole basis for treatment or other patient management decisions. A negative result may occur with  improper specimen collection/handling, submission of specimen other than nasopharyngeal swab, presence of viral mutation(s) within the areas targeted by this assay, and inadequate number of viral copies(<138 copies/mL). A negative result must be combined with clinical observations, patient history, and epidemiological information. The expected result is Negative.  Fact Sheet for Patients:  bloggercourse.com  Fact Sheet for Healthcare Providers:  seriousbroker.it  This test is no t yet approved or cleared by the [place]  FDA and  has been authorized for detection and/or diagnosis of SARS-CoV-2 by FDA under an Emergency Use Authorization (EUA). This EUA will remain  in effect (meaning this test can be used) for the duration of the COVID-19 declaration under Section 564(b)(1) of the Act, 21 U.S.C.section 360bbb-3(b)(1), unless the authorization is terminated  or revoked sooner.       Influenza A by PCR NEGATIVE NEGATIVE  Final   Influenza B by PCR NEGATIVE NEGATIVE Final    Comment: (NOTE) The Xpert Xpress SARS-CoV-2/FLU/RSV plus assay is intended as an aid in the diagnosis of influenza from Nasopharyngeal swab specimens and should not be used as a sole basis for treatment. Nasal washings and aspirates are unacceptable for Xpert Xpress SARS-CoV-2/FLU/RSV testing.  Fact Sheet for Patients: bloggercourse.com  Fact Sheet for Healthcare Providers: seriousbroker.it  This test is not yet approved or cleared by the [place]  FDA and has been authorized for detection and/or diagnosis of SARS-CoV-2 by FDA under an Emergency Use Authorization (EUA). This EUA will remain in effect (meaning this test can be used) for the duration of the COVID-19 declaration under Section 564(b)(1) of the Act, 21 U.S.C. section 360bbb-3(b)(1), unless the authorization is terminated or revoked.     Resp Syncytial Virus by PCR NEGATIVE NEGATIVE Final    Comment: (NOTE) Fact Sheet for Patients: bloggercourse.com  Fact Sheet for Healthcare Providers: seriousbroker.it  This test is not yet approved or cleared by the [place]  FDA and has been authorized for detection and/or diagnosis of SARS-CoV-2 by FDA under an Emergency Use Authorization (EUA). This EUA will remain in effect (meaning this test can be used) for the duration of the COVID-19 declaration under Section 564(b)(1) of the Act, 21 U.S.C. section 360bbb-3(b)(1), unless the authorization is terminated or revoked.  Performed at [facility], [address] [place], [place] [address]   Blood culture (routine x 2)     Status: None (Preliminary result)   Collection Time: 10/09/24 12:56 PM   Specimen: BLOOD  Result Value Ref Range Status   Specimen Description   Final    BLOOD RIGHT ANTECUBITAL Performed at [facility],  [address] [place], [place] [address]    Special Requests   Final    BOTTLES DRAWN AEROBIC AND ANAEROBIC Blood Culture adequate volume Performed at [facility], [address] [place], [place] [address]    Culture   Final    NO GROWTH < 12 HOURS Performed at [facility], [address] [place], [place] [address]    Report Status PENDING  Incomplete  Blood culture (routine x 2)     Status: None (Preliminary result)   Collection Time: 10/09/24  3:15 PM   Specimen: BLOOD LEFT HAND  Result Value Ref Range Status   Specimen Description   Final    BLOOD LEFT HAND Performed at [facility], [address], [place], [place] [address]    Special Requests   Final    BOTTLES DRAWN AEROBIC ONLY Blood Culture results may not be optimal due to an inadequate volume of blood received in culture bottles Performed at [facility], [address], [place], [place] [address]    Culture   Final    NO GROWTH < 12 HOURS Performed at [facility], [address] [place], [place] [address]    Report Status PENDING  Incomplete  MRSA Next Gen by PCR, Nasal     Status: None   Collection Time: 10/09/24  3:58 PM   Specimen: Nasal Mucosa; Nasal Swab  Result Value Ref Range Status   MRSA by PCR Next Gen NOT DETECTED NOT DETECTED Final    Comment: (NOTE) The GeneXpert MRSA Assay (FDA approved for NASAL specimens only), is one component of a comprehensive MRSA colonization surveillance program. It is not intended to diagnose MRSA infection nor to guide or monitor treatment for MRSA infections. Test performance is not FDA approved in patients less than 45 years old. Performed at [facility], [address], [place], [place] [address]      Radiology Studies: CT Angio Chest PE W and/or Wo Contrast Result Date: 10/09/2024 CLINICAL DATA:  Pulmonary embolism (PE) suspected, high prob Shortness of breath with productive  cough for 2 days. EXAM: CT ANGIOGRAPHY CHEST WITH CONTRAST TECHNIQUE: Multidetector CT imaging of the chest was performed using the standard protocol during bolus administration of intravenous contrast. Multiplanar CT image reconstructions and MIPs were obtained to evaluate the vascular anatomy. RADIATION DOSE REDUCTION: This exam was performed according to the departmental dose-optimization program which includes automated exposure control, adjustment of the mA and/or kV according to patient size and/or use of iterative reconstruction technique. CONTRAST:  75mL OMNIPAQUE  IOHEXOL  350 MG/ML SOLN COMPARISON:  Same day radiographs.  Chest CTA 05/18/2018. FINDINGS: Cardiovascular:  The pulmonary arteries are well opacified with contrast to the level of the segmental branches. There is no evidence of acute pulmonary embolism. There is central enlargement of the pulmonary arteries consistent with pulmonary arterial hypertension. Atherosclerosis of the aorta, great vessels and coronary arteries. No acute systemic arterial abnormalities are identified. There are calcifications of the aortic valve. The heart is mildly enlarged. No significant pericardial fluid. Mediastinum/Nodes: There are no enlarged mediastinal, hilar or axillary lymph nodes. The thyroid  gland, trachea and esophagus demonstrate no significant findings. Lungs/Pleura: No pleural effusion or pneumothorax. Central airway thickening with new multifocal patchy ground-glass opacities in both lungs. No confluent airspace disease or suspicious nodularity. Upper abdomen: No significant findings are seen in the visualized upper abdomen status post cholecystectomy. Musculoskeletal/Chest wall: There is no chest wall mass or suspicious osseous finding. Mild multilevel spondylosis. Postsurgical changes at the right shoulder. Review of the MIP images confirms the above findings. IMPRESSION: 1. No evidence of acute pulmonary embolism or other acute vascular findings in the  chest. 2. Central airway thickening with new multifocal patchy ground-glass opacities in both lungs, likely infectious or inflammatory. No confluent airspace disease or suspicious nodularity. Recommend clinical correlation and radiographic follow-up. 3. Central enlargement of the pulmonary arteries consistent with pulmonary arterial hypertension. 4.  Aortic Atherosclerosis (ICD10-I70.0). Electronically Signed   By: Elsie Perone M.D.   On: 10/09/2024 12:39   DG Chest Port 1 View Result Date: 10/09/2024 EXAM: 1 VIEW(S) XRAY OF THE CHEST 10/09/2024 09:46:00 AM COMPARISON: Comparison in 06/19/2022. CLINICAL HISTORY: Pt has complaints of shortness of breath with a productive cough X2 days. FINDINGS: LUNGS AND PLEURA: No focal pulmonary opacity. HEART AND MEDIASTINUM: Cardiac shadow is enlarged but stable. Tortuous thoracic aorta. Aortic atherosclerotic calcifications. BONES AND SOFT TISSUES: No acute osseous abnormality. IMPRESSION: 1. No acute cardiopulmonary process. 2. Borderline cardiomegaly, unchanged from prior. Electronically signed by: Oneil Devonshire MD 10/09/2024 10:08 AM EDT RP Workstation: [id number]    Scheduled Meds:  apixaban   5 mg Oral BID   busPIRone   10 mg Oral BID   carbidopa -levodopa   1 tablet Oral QHS   carbidopa -levodopa   2 tablet Oral Daily   carbidopa -levodopa   3 tablet Oral Daily   clonazepam   0.25 mg Oral QHS   donepezil   10 mg Oral Daily   escitalopram   20 mg Oral Daily   furosemide  40 mg Oral Daily   ipratropium-albuterol   3 mL Nebulization TID   mirabegron  ER  50 mg Oral QHS   pantoprazole   40 mg Oral Daily   propranolol   10 mg Oral BID   Continuous Infusions:  azithromycin 500 mg (10/10/24 1040)   cefTRIAXone  (ROCEPHIN )  IV 1 g (10/10/24 0905)     LOS: 1 day   Shirley Mustache, MD [facility] P10/29/2025, 11:17 AM

## 2024-10-11 ENCOUNTER — Other Ambulatory Visit (HOSPITAL_COMMUNITY): Payer: Self-pay

## 2024-10-11 DIAGNOSIS — A419 Sepsis, unspecified organism: Secondary | ICD-10-CM | POA: Diagnosis not present

## 2024-10-11 DIAGNOSIS — J189 Pneumonia, unspecified organism: Secondary | ICD-10-CM | POA: Diagnosis not present

## 2024-10-11 LAB — BLOOD CULTURE ID PANEL (REFLEXED) - BCID2

## 2024-10-11 LAB — STREP PNEUMONIAE URINARY ANTIGEN: Strep Pneumo Urinary Antigen: NEGATIVE

## 2024-10-11 MED ORDER — GUAIFENESIN ER 600 MG PO TB12
600.0000 mg | ORAL_TABLET | Freq: Two times a day (BID) | ORAL | 0 refills | Status: AC
  Filled 2024-10-11: qty 14, 7d supply, fill #0

## 2024-10-11 MED ORDER — AMOXICILLIN-POT CLAVULANATE 875-125 MG PO TABS
1.0000 | ORAL_TABLET | Freq: Two times a day (BID) | ORAL | 0 refills | Status: AC
  Filled 2024-10-11: qty 6, 3d supply, fill #0

## 2024-10-11 MED ORDER — AMOXICILLIN-POT CLAVULANATE 875-125 MG PO TABS
1.0000 | ORAL_TABLET | Freq: Two times a day (BID) | ORAL | Status: DC
Start: 2024-10-11 — End: 2024-10-11
  Administered 2024-10-11: 1 via ORAL
  Filled 2024-10-11: qty 1

## 2024-10-11 NOTE — TOC Transition Note (Signed)
 Transition of Care [facility]) - Discharge Note   Patient Details  Name: [name] MRN: [id number] Date of Birth: 03-16-1941  Transition of Care [facility]) CM/SW Contact:  Heather DELENA Saltness, LCSW Phone Number: 10/11/2024, 11:29 AM   Clinical Narrative:    Pt discharging home to [facility] ILF apartment today. Pt agreeable to [facility] services, denies [facility] agency preference. HH PT set up with [facility], spoke with Amy who confirmed ability to accept pt for services. CSW spoke with pt's daughter, Percell Rao [phone], via phone call to discuss discharge planning. Daughter reports pt receives home care services through [facility], curreently partial days, but trying to set up full 24/7 care. Daughter reports paying out-of-pocket for [facility]. Pt's daughter to transport pt home upon discharge. No further TOC needs at this time.   Final next level of care: Home/Self Care Barriers to Discharge: Barriers Resolved   Patient Goals and CMS Choice Patient states their goals for this hospitalization and ongoing recovery are:: To [facility] to [facility]        Discharge Placement  Home              Patient to be transferred to facility by: Daughter Name of family member notified: Percell Rao Patient and family notified of of transfer: 10/11/24  Discharge Plan and Services Additional resources added to the After Visit Summary for  [facility] In-house Referral: Clinical Social Work Discharge Planning Services: NA Post Acute Care Choice: NA          DME Arranged: N/A DME Agency: NA       HH Arranged: PT HH Agency: [facility] Date HH Agency Contacted: 10/11/24 Time HH Agency Contacted: 1129 Representative spoke with at [facility] Agency: Amy  Social Drivers of Health (SDOH) Interventions SDOH Screenings   Food Insecurity: No Food Insecurity (10/09/2024)  Housing: Low Risk  (10/09/2024)  Transportation Needs: No Transportation Needs (10/09/2024)  Utilities: Not At Risk  (10/09/2024)  Social Connections: Moderately Isolated (10/09/2024)  Tobacco Use: Medium Risk (10/09/2024)     Readmission Risk Interventions    10/10/2024   12:54 PM  Readmission Risk Prevention Plan  Transportation Screening Complete  PCP or Specialist Appt within 5-7 Days Complete  Home Care Screening Complete  Medication Review (RN CM) Complete     Signed: Heather Saltness, MSW, LCSW Clinical Social Worker Inpatient Care Management 10/11/2024 11:33 AM

## 2024-10-11 NOTE — Progress Notes (Signed)
 PHARMACY - PHYSICIAN COMMUNICATION CRITICAL VALUE ALERT - BLOOD CULTURE IDENTIFICATION (BCID)  [name] is an 83 y.o. female who presented to [facility] on [facility]/09/[facility]24 with a chief complaint of CAP  Assessment:  1/3 gram positive cocci in clusters, no ID  Name of physician (or Provider) Contacted: Andrez  Current antibiotics: CTZ, Azith  Changes to prescribed antibiotics recommended:  None, probable contaminant  Results for orders placed or performed during the hospital encounter of 10/09/24  Blood Culture ID Panel (Reflexed) (Collected: 10/09/2024 12:56 PM)  Result Value Ref Range   Enterococcus faecalis NOT DETECTED NOT DETECTED   Enterococcus Faecium NOT DETECTED NOT DETECTED   Listeria monocytogenes NOT DETECTED NOT DETECTED   Staphylococcus species NOT DETECTED NOT DETECTED   Staphylococcus aureus (BCID) NOT DETECTED NOT DETECTED   Staphylococcus epidermidis NOT DETECTED NOT DETECTED   Staphylococcus lugdunensis NOT DETECTED NOT DETECTED   Streptococcus species NOT DETECTED NOT DETECTED   Streptococcus agalactiae NOT DETECTED NOT DETECTED   Streptococcus pneumoniae NOT DETECTED NOT DETECTED   Streptococcus pyogenes NOT DETECTED NOT DETECTED   A.calcoaceticus-baumannii NOT DETECTED NOT DETECTED   Bacteroides fragilis NOT DETECTED NOT DETECTED   Enterobacterales NOT DETECTED NOT DETECTED   Enterobacter cloacae complex NOT DETECTED NOT DETECTED   Escherichia coli NOT DETECTED NOT DETECTED   Klebsiella aerogenes NOT DETECTED NOT DETECTED   Klebsiella oxytoca NOT DETECTED NOT DETECTED   Klebsiella pneumoniae NOT DETECTED NOT DETECTED   Proteus species NOT DETECTED NOT DETECTED   Salmonella species NOT DETECTED NOT DETECTED   Serratia marcescens NOT DETECTED NOT DETECTED   Haemophilus influenzae NOT DETECTED NOT DETECTED   Neisseria meningitidis NOT DETECTED NOT DETECTED   Pseudomonas aeruginosa NOT DETECTED NOT DETECTED   Stenotrophomonas maltophilia NOT DETECTED NOT  DETECTED   Candida albicans NOT DETECTED NOT DETECTED   Candida auris NOT DETECTED NOT DETECTED   Candida glabrata NOT DETECTED NOT DETECTED   Candida krusei NOT DETECTED NOT DETECTED   Candida parapsilosis NOT DETECTED NOT DETECTED   Candida tropicalis NOT DETECTED NOT DETECTED   Cryptococcus neoformans/gattii NOT DETECTED NOT DETECTED    Leeroy Mace RPh 10/11/2024, 1:24 AM

## 2024-10-11 NOTE — Discharge Summary (Signed)
 Physician Discharge Summary  [name] FMW:[id number] DOB: 17-Dec-1940 DOA: 10/09/2024  PCP: Loreli Elsie JONETTA Mickey., MD  Admit date: 10/09/2024 Discharge date: 10/11/2024  Admitted From: Home Disposition:  Home  Discharge Condition:Stable CODE STATUS:FULL Diet recommendation: Heart Healthy  Brief/Interim Summary: Patient is a 83 year old female with history of DVT on Eliquis , hypertension, GERD, Parkinson disease who presented with complaint of cough.  She was tachypnea on arrival but was on room air.  CT of the chest showed multifocal pneumonia.  Started on broad spectrum antibiotics.  Culture sent.  Clinically better.  She has been weaned to room air.  Still has some cough but she really wants to go back to her ILF today.  Medically stable for discharge.  Following problems were addressed during the hospitalization:  Sepsis secondary to pneumonia: Met sepsis criteria with tachypnea, leukocytosis.  Secondary to pneumonia.   Afebrile.  Improved leukocytosis.  One of the blood cultures showed gram-positive cocci but BCID negative.  Most likely contamination.  Antibiotics changed to oral.   Multifocal pneumonia: Chest x-ray did not show any acute findings.  CT angio chest showed central airway thickening with new multifocal patchy ground-glass opacities in both lungs, likely infectious or inflammatory. No confluent airspace disease or suspicious nodularity.  Continue current antibiotics.  Currently she is on room air.  Leukocytosis improving   History of Parkinson disease: On Sinemet , Aricept , Klonopin    History of DVT: On Eliquis    Hypertension: Continue home regimen   Debility/deconditioning: Patient lives at ILF.  Consulted PT,recommended HH     Discharge Diagnoses:  Principal Problem:   Sepsis due to pneumonia [facility])    Discharge Instructions  Discharge Instructions     Diet - low sodium heart healthy   Complete by: As directed    Discharge instructions   Complete by:  As directed    1)Please take your medications as instructed 2)Follow up with your PCP next week 3)Monitor your blood pressure at home   Increase activity slowly   Complete by: As directed       Allergies as of 10/11/2024       Reactions   Buprenorphine Hcl Nausea And Vomiting   Codeine Nausea And Vomiting   But tolerates Tramadol    Morphine Nausea And Vomiting   Procaine    Novacaine-Shakes        Medication List     STOP taking these medications    dextromethorphan-guaiFENesin 30-600 MG 12hr tablet Commonly known as: MUCINEX DM       TAKE these medications    acetaminophen  650 MG CR tablet Commonly known as: TYLENOL  Take 650 mg by mouth every 8 (eight) hours as needed for pain.   albuterol  108 (90 Base) MCG/ACT inhaler Commonly known as: VENTOLIN  HFA Inhale 2 puffs into the lungs every 6 (six) hours as needed for wheezing or shortness of breath.   amoxicillin -clavulanate 875-125 MG tablet Commonly known as: AUGMENTIN  Take 1 tablet by mouth every 12 (twelve) hours for 3 days.   apixaban  5 MG Tabs tablet Commonly known as: ELIQUIS  Take 1 tablet (5 mg total) by mouth 2 (two) times daily.   busPIRone  10 MG tablet Commonly known as: BUSPAR  Take 10 mg by mouth 2 (two) times daily.   carbidopa -levodopa  25-100 MG tablet Commonly known as: SINEMET  IR TAKE 2 TABLETS BY MOUTH DAILY AT 9 A.M. THEN 2 TABLETS AT 1 P.M. THEN 1 TABLET AT 5 P.M. AS DIRECTED What changed: See the new instructions.   cholecalciferol  25  MCG (1000 UNIT) tablet Commonly known as: VITAMIN D3 Take 1,000 Units by mouth daily.   clonazePAM  0.5 MG tablet Commonly known as: KLONOPIN  Take 0.5 tablets (0.25 mg total) by mouth at bedtime.   cyanocobalamin  1000 MCG tablet Commonly known as: VITAMIN B12 Take 1,000 mcg by mouth daily.   diphenhydramine -acetaminophen  25-500 MG Tabs tablet Commonly known as: TYLENOL  PM Take 1 tablet by mouth at bedtime as needed (Sleep/pain).   donepezil  10  MG tablet Commonly known as: ARICEPT  Take 10 mg by mouth in the morning.   escitalopram  20 MG tablet Commonly known as: LEXAPRO  Take 20 mg by mouth daily.   esomeprazole 40 MG capsule Commonly known as: NEXIUM Take 40 mg by mouth 2 (two) times daily before a meal.   furosemide 40 MG tablet Commonly known as: LASIX Take 40 mg by mouth daily.   guaiFENesin 600 MG 12 hr tablet Commonly known as: Mucinex Take 1 tablet (600 mg total) by mouth 2 (two) times daily for 7 days.   ipratropium 0.06 % nasal spray Commonly known as: ATROVENT Place 2 sprays into both nostrils 2 (two) times daily as needed for rhinitis.   loratadine  10 MG tablet Commonly known as: CLARITIN  Take 10 mg by mouth at bedtime.   Myrbetriq  50 MG Tb24 tablet Generic drug: mirabegron  ER Take 50 mg by mouth at bedtime.   nitrofurantoin  100 MG capsule Commonly known as: MACRODANTIN  Take 100 mg by mouth daily.   NON FORMULARY Pt uses a cpap nightly   olmesartan 20 MG tablet Commonly known as: BENICAR Take 20 mg by mouth in the morning and at bedtime.   propranolol  10 MG tablet Commonly known as: INDERAL  TAKE 1 TABLET BY MOUTH 2 TIMES A DAY   saccharomyces boulardii 250 MG capsule Commonly known as: FLORASTOR Take 250 mg by mouth 2 (two) times daily.        Follow-up Information     Loreli Elsie JONETTA Mickey., MD. Schedule an appointment as soon as possible for a visit in 1 week(s).   Specialty: Internal Medicine Contact information: [address] [place] [address] [phone]                Allergies  Allergen Reactions   Buprenorphine Hcl Nausea And Vomiting   Codeine Nausea And Vomiting    But tolerates Tramadol     Morphine Nausea And Vomiting   Procaine     Novacaine-Shakes    Consultations: None   Procedures/Studies: CT Angio Chest PE W and/or Wo Contrast Result Date: 10/09/2024 CLINICAL DATA:  Pulmonary embolism (PE) suspected, high prob Shortness of breath with  productive cough for 2 days. EXAM: CT ANGIOGRAPHY CHEST WITH CONTRAST TECHNIQUE: Multidetector CT imaging of the chest was performed using the standard protocol during bolus administration of intravenous contrast. Multiplanar CT image reconstructions and MIPs were obtained to evaluate the vascular anatomy. RADIATION DOSE REDUCTION: This exam was performed according to the departmental dose-optimization program which includes automated exposure control, adjustment of the mA and/or kV according to patient size and/or use of iterative reconstruction technique. CONTRAST:  75mL OMNIPAQUE  IOHEXOL  350 MG/ML SOLN COMPARISON:  Same day radiographs.  Chest CTA 05/18/2018. FINDINGS: Cardiovascular: The pulmonary arteries are well opacified with contrast to the level of the segmental branches. There is no evidence of acute pulmonary embolism. There is central enlargement of the pulmonary arteries consistent with pulmonary arterial hypertension. Atherosclerosis of the aorta, great vessels and coronary arteries. No acute systemic arterial abnormalities are identified. There are  calcifications of the aortic valve. The heart is mildly enlarged. No significant pericardial fluid. Mediastinum/Nodes: There are no enlarged mediastinal, hilar or axillary lymph nodes. The thyroid  gland, trachea and esophagus demonstrate no significant findings. Lungs/Pleura: No pleural effusion or pneumothorax. Central airway thickening with new multifocal patchy ground-glass opacities in both lungs. No confluent airspace disease or suspicious nodularity. Upper abdomen: No significant findings are seen in the visualized upper abdomen status post cholecystectomy. Musculoskeletal/Chest wall: There is no chest wall mass or suspicious osseous finding. Mild multilevel spondylosis. Postsurgical changes at the right shoulder. Review of the MIP images confirms the above findings. IMPRESSION: 1. No evidence of acute pulmonary embolism or other acute vascular  findings in the chest. 2. Central airway thickening with new multifocal patchy ground-glass opacities in both lungs, likely infectious or inflammatory. No confluent airspace disease or suspicious nodularity. Recommend clinical correlation and radiographic follow-up. 3. Central enlargement of the pulmonary arteries consistent with pulmonary arterial hypertension. 4.  Aortic Atherosclerosis (ICD10-I70.0). Electronically Signed   By: Elsie Perone M.D.   On: 10/09/2024 12:39   DG Chest Port 1 View Result Date: 10/09/2024 EXAM: 1 VIEW(S) XRAY OF THE CHEST 10/09/2024 09:46:00 AM COMPARISON: Comparison in 06/19/2022. CLINICAL HISTORY: Pt has complaints of shortness of breath with a productive cough X2 days. FINDINGS: LUNGS AND PLEURA: No focal pulmonary opacity. HEART AND MEDIASTINUM: Cardiac shadow is enlarged but stable. Tortuous thoracic aorta. Aortic atherosclerotic calcifications. BONES AND SOFT TISSUES: No acute osseous abnormality. IMPRESSION: 1. No acute cardiopulmonary process. 2. Borderline cardiomegaly, unchanged from prior. Electronically signed by: Oneil Devonshire MD 10/09/2024 10:08 AM EDT RP Workstation: [id number]      Subjective: Patient seen and examined at bedside today.  Hemodynamically stable.  On room air.  Feels better today.  Wants to go home.  Still has some cough but on room air since yesterday.  I called and discussed with the daughter about discharge planning  Discharge Exam: Vitals:   10/11/24 0748 10/11/24 0845  BP:  (!) 155/102  Pulse:  70  Resp:    Temp:    SpO2: 91%    Vitals:   10/10/24 1945 10/11/24 0432 10/11/24 0748 10/11/24 0845  BP: (!) 164/74 (!) 154/77  (!) 155/102  Pulse: 78 69  70  Resp: 19 19    Temp: 98.3 F (36.8 C) 98.2 F (36.8 C)    TempSrc: Oral Oral    SpO2: 97% 95% 91%   Height:        General: Pt is alert, awake, not in acute distress Cardiovascular: RRR, S1/S2 +, no rubs, no gallops Respiratory: CTA bilaterally, no wheezing, no  rhonchi Abdominal: Soft, NT, ND, bowel sounds + Extremities: no edema, no cyanosis    The results of significant diagnostics from this hospitalization (including imaging, microbiology, ancillary and laboratory) are listed below for reference.     Microbiology: Recent Results (from the past 240 hours)  Resp panel by RT-PCR (RSV, Flu A&B, Covid) Anterior Nasal Swab     Status: None   Collection Time: 10/09/24  9:26 AM   Specimen: Anterior Nasal Swab  Result Value Ref Range Status   SARS Coronavirus 2 by RT PCR NEGATIVE NEGATIVE Final    Comment: (NOTE) SARS-CoV-2 target nucleic acids are NOT DETECTED.  The SARS-CoV-2 RNA is generally detectable in upper respiratory specimens during the acute phase of infection. The lowest concentration of SARS-CoV-2 viral copies this assay can detect is 138 copies/mL. A negative result does not preclude SARS-Cov-2 infection and should  not be used as the sole basis for treatment or other patient management decisions. A negative result may occur with  improper specimen collection/handling, submission of specimen other than nasopharyngeal swab, presence of viral mutation(s) within the areas targeted by this assay, and inadequate number of viral copies(<138 copies/mL). A negative result must be combined with clinical observations, patient history, and epidemiological information. The expected result is Negative.  Fact Sheet for Patients:  bloggercourse.com  Fact Sheet for Healthcare Providers:  seriousbroker.it  This test is no t yet approved or cleared by the [place]  FDA and  has been authorized for detection and/or diagnosis of SARS-CoV-2 by FDA under an Emergency Use Authorization (EUA). This EUA will remain  in effect (meaning this test can be used) for the duration of the COVID-19 declaration under Section 564(b)(1) of the Act, 21 U.S.C.section 360bbb-3(b)(1), unless the authorization is  terminated  or revoked sooner.       Influenza A by PCR NEGATIVE NEGATIVE Final   Influenza B by PCR NEGATIVE NEGATIVE Final    Comment: (NOTE) The Xpert Xpress SARS-CoV-2/FLU/RSV plus assay is intended as an aid in the diagnosis of influenza from Nasopharyngeal swab specimens and should not be used as a sole basis for treatment. Nasal washings and aspirates are unacceptable for Xpert Xpress SARS-CoV-2/FLU/RSV testing.  Fact Sheet for Patients: bloggercourse.com  Fact Sheet for Healthcare Providers: seriousbroker.it  This test is not yet approved or cleared by the [place]  FDA and has been authorized for detection and/or diagnosis of SARS-CoV-2 by FDA under an Emergency Use Authorization (EUA). This EUA will remain in effect (meaning this test can be used) for the duration of the COVID-19 declaration under Section 564(b)(1) of the Act, 21 U.S.C. section 360bbb-3(b)(1), unless the authorization is terminated or revoked.     Resp Syncytial Virus by PCR NEGATIVE NEGATIVE Final    Comment: (NOTE) Fact Sheet for Patients: bloggercourse.com  Fact Sheet for Healthcare Providers: seriousbroker.it  This test is not yet approved or cleared by the [place]  FDA and has been authorized for detection and/or diagnosis of SARS-CoV-2 by FDA under an Emergency Use Authorization (EUA). This EUA will remain in effect (meaning this test can be used) for the duration of the COVID-19 declaration under Section 564(b)(1) of the Act, 21 U.S.C. section 360bbb-3(b)(1), unless the authorization is terminated or revoked.  Performed at [facility], [address], [place], [place] [address]   Blood culture (routine x 2)     Status: None (Preliminary result)   Collection Time: 10/09/24 12:56 PM   Specimen: BLOOD  Result Value Ref Range Status   Specimen Description    Final    BLOOD RIGHT ANTECUBITAL Performed at [facility], [address], [place], [place] [address]    Special Requests   Final    BOTTLES DRAWN AEROBIC AND ANAEROBIC Blood Culture adequate volume Performed at [facility], [address], [place], [place] [address]    Culture  Setup Time   Final    GRAM POSITIVE COCCI IN CLUSTERS ANAEROBIC BOTTLE ONLY CRITICAL RESULT CALLED TO, READ BACK BY AND VERIFIED WITH: PHARMD E JACKSON 10/11/2024 @ 0055 BY AB Performed at [facility], [address], [place], [place] [address]    Culture [facility] POSITIVE COCCI  Final   Report Status PENDING  Incomplete  Blood Culture ID Panel (Reflexed)     Status: None   Collection Time: 10/09/24 12:56 PM  Result Value Ref  Range Status   Enterococcus faecalis NOT DETECTED NOT DETECTED Final   Enterococcus Faecium NOT DETECTED NOT DETECTED Final   Listeria monocytogenes NOT DETECTED NOT DETECTED Final   Staphylococcus species NOT DETECTED NOT DETECTED Final   Staphylococcus aureus (BCID) NOT DETECTED NOT DETECTED Final   Staphylococcus epidermidis NOT DETECTED NOT DETECTED Final   Staphylococcus lugdunensis NOT DETECTED NOT DETECTED Final   Streptococcus species NOT DETECTED NOT DETECTED Final   Streptococcus agalactiae NOT DETECTED NOT DETECTED Final   Streptococcus pneumoniae NOT DETECTED NOT DETECTED Final   Streptococcus pyogenes NOT DETECTED NOT DETECTED Final   A.calcoaceticus-baumannii NOT DETECTED NOT DETECTED Final   Bacteroides fragilis NOT DETECTED NOT DETECTED Final   Enterobacterales NOT DETECTED NOT DETECTED Final   Enterobacter cloacae complex NOT DETECTED NOT DETECTED Final   Escherichia coli NOT DETECTED NOT DETECTED Final   Klebsiella aerogenes NOT DETECTED NOT DETECTED Final   Klebsiella oxytoca NOT DETECTED NOT DETECTED Final   Klebsiella pneumoniae NOT DETECTED NOT DETECTED Final   Proteus species NOT DETECTED NOT DETECTED Final    Salmonella species NOT DETECTED NOT DETECTED Final   Serratia marcescens NOT DETECTED NOT DETECTED Final   Haemophilus influenzae NOT DETECTED NOT DETECTED Final   Neisseria meningitidis NOT DETECTED NOT DETECTED Final   Pseudomonas aeruginosa NOT DETECTED NOT DETECTED Final   Stenotrophomonas maltophilia NOT DETECTED NOT DETECTED Final   Candida albicans NOT DETECTED NOT DETECTED Final   Candida auris NOT DETECTED NOT DETECTED Final   Candida glabrata NOT DETECTED NOT DETECTED Final   Candida krusei NOT DETECTED NOT DETECTED Final   Candida parapsilosis NOT DETECTED NOT DETECTED Final   Candida tropicalis NOT DETECTED NOT DETECTED Final   Cryptococcus neoformans/gattii NOT DETECTED NOT DETECTED Final    Comment: Performed at [facility], [address] [place], [place] [address]  Blood culture (routine x 2)     Status: None (Preliminary result)   Collection Time: 10/09/24  3:15 PM   Specimen: BLOOD LEFT HAND  Result Value Ref Range Status   Specimen Description   Final    BLOOD LEFT HAND Performed at [facility], [address] [place], [place] [address]    Special Requests   Final    BOTTLES DRAWN AEROBIC ONLY Blood Culture results may not be optimal due to an inadequate volume of blood received in culture bottles Performed at [facility], [address] [place], [place] [address]    Culture   Final    NO GROWTH 2 DAYS Performed at [facility], [address], [place], [place] [address]    Report Status PENDING  Incomplete  MRSA Next Gen by PCR, Nasal     Status: None   Collection Time: 10/09/24  3:58 PM   Specimen: Nasal Mucosa; Nasal Swab  Result Value Ref Range Status   MRSA by PCR Next Gen NOT DETECTED NOT DETECTED Final    Comment: (NOTE) The GeneXpert MRSA Assay (FDA approved for NASAL specimens only), is one component of a comprehensive MRSA colonization surveillance program. It is not intended to diagnose MRSA infection nor  to guide or monitor treatment for MRSA infections. Test performance is not FDA approved in patients less than 42 years old. Performed at [facility], [address], [place], [place] [address]      Labs: BNP (last 3 results) No results for input(s): BNP in the last 8760 hours. Basic Metabolic Panel: Recent Labs  Lab  10/09/24 0913 10/10/24 0605  NA 137 139  K 3.4* 3.6  CL 101 104  CO2 26 25  GLUCOSE 101* 105*  BUN 17 14  CREATININE 0.80 0.68  CALCIUM  8.7* 8.7*   Liver Function Tests: Recent Labs  Lab 10/09/24 0913  AST 16  ALT <5  ALKPHOS 91  BILITOT 0.4  PROT 7.2  ALBUMIN 3.7   No results for input(s): LIPASE, AMYLASE in the last 168 hours. No results for input(s): AMMONIA in the last 168 hours. CBC: Recent Labs  Lab 10/09/24 0913 10/10/24 0605  WBC 14.6* 11.3*  NEUTROABS 10.6*  --   HGB 11.1* 10.6*  HCT 35.2* 33.2*  MCV 95.4 96.2  PLT 195 205   Cardiac Enzymes: No results for input(s): CKTOTAL, CKMB, CKMBINDEX, TROPONINI in the last 168 hours. BNP: Invalid input(s): POCBNP CBG: No results for input(s): GLUCAP in the last 168 hours. D-Dimer No results for input(s): DDIMER in the last 72 hours. Hgb A1c No results for input(s): HGBA1C in the last 72 hours. Lipid Profile No results for input(s): CHOL, HDL, LDLCALC, TRIG, CHOLHDL, LDLDIRECT in the last 72 hours. Thyroid  function studies No results for input(s): TSH, T4TOTAL, T3FREE, THYROIDAB in the last 72 hours.  Invalid input(s): FREET3 Anemia work up No results for input(s): VITAMINB12, FOLATE, FERRITIN, TIBC, IRON, RETICCTPCT in the last 72 hours. Urinalysis    Component Value Date/Time   COLORURINE YELLOW 06/19/2022 0039   APPEARANCEUR HAZY (A) 06/19/2022 0039   LABSPEC 1.021 06/19/2022 0039   PHURINE 5.0 06/19/2022 0039   GLUCOSEU NEGATIVE 06/19/2022 0039   HGBUR SMALL (A) 06/19/2022 0039   BILIRUBINUR NEGATIVE  06/19/2022 0039   KETONESUR 20 (A) 06/19/2022 0039   PROTEINUR NEGATIVE 06/19/2022 0039   UROBILINOGEN 0.2 08/24/2015 2040   NITRITE NEGATIVE 06/19/2022 0039   LEUKOCYTESUR NEGATIVE 06/19/2022 0039   Sepsis Labs Recent Labs  Lab 10/09/24 0913 10/10/24 0605  WBC 14.6* 11.3*   Microbiology Recent Results (from the past 240 hours)  Resp panel by RT-PCR (RSV, Flu A&B, Covid) Anterior Nasal Swab     Status: None   Collection Time: 10/09/24  9:26 AM   Specimen: Anterior Nasal Swab  Result Value Ref Range Status   SARS Coronavirus 2 by RT PCR NEGATIVE NEGATIVE Final    Comment: (NOTE) SARS-CoV-2 target nucleic acids are NOT DETECTED.  The SARS-CoV-2 RNA is generally detectable in upper respiratory specimens during the acute phase of infection. The lowest concentration of SARS-CoV-2 viral copies this assay can detect is 138 copies/mL. A negative result does not preclude SARS-Cov-2 infection and should not be used as the sole basis for treatment or other patient management decisions. A negative result may occur with  improper specimen collection/handling, submission of specimen other than nasopharyngeal swab, presence of viral mutation(s) within the areas targeted by this assay, and inadequate number of viral copies(<138 copies/mL). A negative result must be combined with clinical observations, patient history, and epidemiological information. The expected result is Negative.  Fact Sheet for Patients:  bloggercourse.com  Fact Sheet for Healthcare Providers:  seriousbroker.it  This test is no t yet approved or cleared by the [place]  FDA and  has been authorized for detection and/or diagnosis of SARS-CoV-2 by FDA under an Emergency Use Authorization (EUA). This EUA will remain  in effect (meaning this test can be used) for the duration of the COVID-19 declaration under Section 564(b)(1) of the Act, 21 U.S.C.section  360bbb-3(b)(1), unless the authorization is terminated  or revoked sooner.  Influenza A by PCR NEGATIVE NEGATIVE Final   Influenza B by PCR NEGATIVE NEGATIVE Final    Comment: (NOTE) The Xpert Xpress SARS-CoV-2/FLU/RSV plus assay is intended as an aid in the diagnosis of influenza from Nasopharyngeal swab specimens and should not be used as a sole basis for treatment. Nasal washings and aspirates are unacceptable for Xpert Xpress SARS-CoV-2/FLU/RSV testing.  Fact Sheet for Patients: bloggercourse.com  Fact Sheet for Healthcare Providers: seriousbroker.it  This test is not yet approved or cleared by the [place]  FDA and has been authorized for detection and/or diagnosis of SARS-CoV-2 by FDA under an Emergency Use Authorization (EUA). This EUA will remain in effect (meaning this test can be used) for the duration of the COVID-19 declaration under Section 564(b)(1) of the Act, 21 U.S.C. section 360bbb-3(b)(1), unless the authorization is terminated or revoked.     Resp Syncytial Virus by PCR NEGATIVE NEGATIVE Final    Comment: (NOTE) Fact Sheet for Patients: bloggercourse.com  Fact Sheet for Healthcare Providers: seriousbroker.it  This test is not yet approved or cleared by the [place]  FDA and has been authorized for detection and/or diagnosis of SARS-CoV-2 by FDA under an Emergency Use Authorization (EUA). This EUA will remain in effect (meaning this test can be used) for the duration of the COVID-19 declaration under Section 564(b)(1) of the Act, 21 U.S.C. section 360bbb-3(b)(1), unless the authorization is terminated or revoked.  Performed at [facility], [address] [place], [place] [address]   Blood culture (routine x 2)     Status: None (Preliminary result)   Collection Time: 10/09/24 12:56 PM   Specimen: BLOOD  Result Value  Ref Range Status   Specimen Description   Final    BLOOD RIGHT ANTECUBITAL Performed at [facility], [address] [place], [place] [address]    Special Requests   Final    BOTTLES DRAWN AEROBIC AND ANAEROBIC Blood Culture adequate volume Performed at [facility], [address], [place], [place] [address]    Culture  Setup Time   Final    GRAM POSITIVE COCCI IN CLUSTERS ANAEROBIC BOTTLE ONLY CRITICAL RESULT CALLED TO, READ BACK BY AND VERIFIED WITH: PHARMD E JACKSON 10/11/2024 @ 0055 BY AB Performed at [facility], [address], [place], [place] [address]    Culture GRAM POSITIVE COCCI  Final   Report Status PENDING  Incomplete  Blood Culture ID Panel (Reflexed)     Status: None   Collection Time: 10/09/24 12:56 PM  Result Value Ref Range Status   Enterococcus faecalis NOT DETECTED NOT DETECTED Final   Enterococcus Faecium NOT DETECTED NOT DETECTED Final   Listeria monocytogenes NOT DETECTED NOT DETECTED Final   Staphylococcus species NOT DETECTED NOT DETECTED Final   Staphylococcus aureus (BCID) NOT DETECTED NOT DETECTED Final   Staphylococcus epidermidis NOT DETECTED NOT DETECTED Final   Staphylococcus lugdunensis NOT DETECTED NOT DETECTED Final   Streptococcus species NOT DETECTED NOT DETECTED Final   Streptococcus agalactiae NOT DETECTED NOT DETECTED Final   Streptococcus pneumoniae NOT DETECTED NOT DETECTED Final   Streptococcus pyogenes NOT DETECTED NOT DETECTED Final   A.calcoaceticus-baumannii NOT DETECTED NOT DETECTED Final   Bacteroides fragilis NOT DETECTED NOT DETECTED Final   Enterobacterales NOT DETECTED NOT DETECTED Final   Enterobacter cloacae complex NOT DETECTED NOT DETECTED Final   Escherichia coli NOT DETECTED NOT DETECTED Final   Klebsiella aerogenes NOT DETECTED NOT DETECTED Final   Klebsiella oxytoca NOT DETECTED NOT  DETECTED Final   Klebsiella pneumoniae NOT DETECTED NOT DETECTED Final   Proteus  species NOT DETECTED NOT DETECTED Final   Salmonella species NOT DETECTED NOT DETECTED Final   Serratia marcescens NOT DETECTED NOT DETECTED Final   Haemophilus influenzae NOT DETECTED NOT DETECTED Final   Neisseria meningitidis NOT DETECTED NOT DETECTED Final   Pseudomonas aeruginosa NOT DETECTED NOT DETECTED Final   Stenotrophomonas maltophilia NOT DETECTED NOT DETECTED Final   Candida albicans NOT DETECTED NOT DETECTED Final   Candida auris NOT DETECTED NOT DETECTED Final   Candida glabrata NOT DETECTED NOT DETECTED Final   Candida krusei NOT DETECTED NOT DETECTED Final   Candida parapsilosis NOT DETECTED NOT DETECTED Final   Candida tropicalis NOT DETECTED NOT DETECTED Final   Cryptococcus neoformans/gattii NOT DETECTED NOT DETECTED Final    Comment: Performed at [facility], [address] [place], [place] [address]  Blood culture (routine x 2)     Status: None (Preliminary result)   Collection Time: 10/09/24  3:15 PM   Specimen: BLOOD LEFT HAND  Result Value Ref Range Status   Specimen Description   Final    BLOOD LEFT HAND Performed at [facility], [address] [place], [place] [address]    Special Requests   Final    BOTTLES DRAWN AEROBIC ONLY Blood Culture results may not be optimal due to an inadequate volume of blood received in culture bottles Performed at [facility], [address] [place], [place] [address]    Culture   Final    NO GROWTH 2 DAYS Performed at [facility], [address] [place], [place] [address]    Report Status PENDING  Incomplete  MRSA Next Gen by PCR, Nasal     Status: None   Collection Time: 10/09/24  3:58 PM   Specimen: Nasal Mucosa; Nasal Swab  Result Value Ref Range Status   MRSA by PCR Next Gen NOT DETECTED NOT DETECTED Final    Comment: (NOTE) The GeneXpert MRSA Assay (FDA approved for NASAL specimens only), is one component of a comprehensive MRSA colonization surveillance program. It is  not intended to diagnose MRSA infection nor to guide or monitor treatment for MRSA infections. Test performance is not FDA approved in patients less than 20 years old. Performed at [facility], [address], [place], [place] [address]     Please note: You were cared for by a hospitalist during your hospital stay. Once you are discharged, your primary care physician will handle any further medical issues. Please note that NO REFILLS for any discharge medications will be authorized once you are discharged, as it is imperative that you return to your primary care physician (or establish a relationship with a primary care physician if you do not have one) for your post hospital discharge needs so that they can reassess your need for medications and monitor your lab values.    Time coordinating discharge: 40 minutes  SIGNED:   Ivonne Mustache, MD  [facility] 10/11/2024, 10:55 AM Pager [phone]  If 7PM-7AM, please contact night-coverage [email or web address] [facility] Physician Discharge Summary  [name] FMW:[id number] DOB: 09-07-1941 DOA: 10/09/2024  PCP: Loreli Elsie JONETTA Mickey., MD  Admit date: 10/09/2024 Discharge date: 10/11/2024  Admitted From: Home Disposition:  Home  Discharge Condition:Stable CODE STATUS:FULL, DNR, Comfort Care Diet recommendation: Heart Healthy / Carb Modified / Regular / Dysphagia   Brief/Interim Summary:   Following problems were addressed during  the hospitalization:   Discharge Diagnoses:  Principal Problem:   Sepsis due to pneumonia [facility])    Discharge Instructions  Discharge Instructions     Diet - low sodium heart healthy   Complete by: As directed    Discharge instructions   Complete by: As directed    1)Please take your medications as instructed 2)Follow up with your PCP next week 3)Monitor your blood pressure at home   Increase activity slowly   Complete by: As directed       Allergies as of  10/11/2024       Reactions   Buprenorphine Hcl Nausea And Vomiting   Codeine Nausea And Vomiting   But tolerates Tramadol    Morphine Nausea And Vomiting   Procaine    Novacaine-Shakes        Medication List     STOP taking these medications    dextromethorphan-guaiFENesin 30-600 MG 12hr tablet Commonly known as: MUCINEX DM       TAKE these medications    acetaminophen  650 MG CR tablet Commonly known as: TYLENOL  Take 650 mg by mouth every 8 (eight) hours as needed for pain.   albuterol  108 (90 Base) MCG/ACT inhaler Commonly known as: VENTOLIN  HFA Inhale 2 puffs into the lungs every 6 (six) hours as needed for wheezing or shortness of breath.   amoxicillin -clavulanate 875-125 MG tablet Commonly known as: AUGMENTIN  Take 1 tablet by mouth every 12 (twelve) hours for 3 days.   apixaban  5 MG Tabs tablet Commonly known as: ELIQUIS  Take 1 tablet (5 mg total) by mouth 2 (two) times daily.   busPIRone  10 MG tablet Commonly known as: BUSPAR  Take 10 mg by mouth 2 (two) times daily.   carbidopa -levodopa  25-100 MG tablet Commonly known as: SINEMET  IR TAKE 2 TABLETS BY MOUTH DAILY AT 9 A.M. THEN 2 TABLETS AT 1 P.M. THEN 1 TABLET AT 5 P.M. AS DIRECTED What changed: See the new instructions.   cholecalciferol  25 MCG (1000 UNIT) tablet Commonly known as: VITAMIN D3 Take 1,000 Units by mouth daily.   clonazePAM  0.5 MG tablet Commonly known as: KLONOPIN  Take 0.5 tablets (0.25 mg total) by mouth at bedtime.   cyanocobalamin  1000 MCG tablet Commonly known as: VITAMIN B12 Take 1,000 mcg by mouth daily.   diphenhydramine -acetaminophen  25-500 MG Tabs tablet Commonly known as: TYLENOL  PM Take 1 tablet by mouth at bedtime as needed (Sleep/pain).   donepezil  10 MG tablet Commonly known as: ARICEPT  Take 10 mg by mouth in the morning.   escitalopram  20 MG tablet Commonly known as: LEXAPRO  Take 20 mg by mouth daily.   esomeprazole 40 MG capsule Commonly known as:  NEXIUM Take 40 mg by mouth 2 (two) times daily before a meal.   furosemide 40 MG tablet Commonly known as: LASIX Take 40 mg by mouth daily.   guaiFENesin 600 MG 12 hr tablet Commonly known as: Mucinex Take 1 tablet (600 mg total) by mouth 2 (two) times daily for 7 days.   ipratropium 0.06 % nasal spray Commonly known as: ATROVENT Place 2 sprays into both nostrils 2 (two) times daily as needed for rhinitis.   loratadine  10 MG tablet Commonly known as: CLARITIN  Take 10 mg by mouth at bedtime.   Myrbetriq  50 MG Tb24 tablet Generic drug: mirabegron  ER Take 50 mg by mouth at bedtime.   nitrofurantoin  100 MG capsule Commonly known as: MACRODANTIN  Take 100 mg by mouth daily.   NON FORMULARY Pt uses a cpap nightly   olmesartan 20 MG  tablet Commonly known as: BENICAR Take 20 mg by mouth in the morning and at bedtime.   propranolol  10 MG tablet Commonly known as: INDERAL  TAKE 1 TABLET BY MOUTH 2 TIMES A DAY   saccharomyces boulardii 250 MG capsule Commonly known as: FLORASTOR Take 250 mg by mouth 2 (two) times daily.        Follow-up Information     Loreli Elsie JONETTA Mickey., MD. Schedule an appointment as soon as possible for a visit in 1 week(s).   Specialty: Internal Medicine Contact information: [address] [place] [address] [phone]                Allergies  Allergen Reactions   Buprenorphine Hcl Nausea And Vomiting   Codeine Nausea And Vomiting    But tolerates Tramadol     Morphine Nausea And Vomiting   Procaine     Novacaine-Shakes    Consultations:    Procedures/Studies: CT Angio Chest PE W and/or Wo Contrast Result Date: 10/09/2024 CLINICAL DATA:  Pulmonary embolism (PE) suspected, high prob Shortness of breath with productive cough for 2 days. EXAM: CT ANGIOGRAPHY CHEST WITH CONTRAST TECHNIQUE: Multidetector CT imaging of the chest was performed using the standard protocol during bolus administration of intravenous contrast.  Multiplanar CT image reconstructions and MIPs were obtained to evaluate the vascular anatomy. RADIATION DOSE REDUCTION: This exam was performed according to the departmental dose-optimization program which includes automated exposure control, adjustment of the mA and/or kV according to patient size and/or use of iterative reconstruction technique. CONTRAST:  75mL OMNIPAQUE  IOHEXOL  350 MG/ML SOLN COMPARISON:  Same day radiographs.  Chest CTA 05/18/2018. FINDINGS: Cardiovascular: The pulmonary arteries are well opacified with contrast to the level of the segmental branches. There is no evidence of acute pulmonary embolism. There is central enlargement of the pulmonary arteries consistent with pulmonary arterial hypertension. Atherosclerosis of the aorta, great vessels and coronary arteries. No acute systemic arterial abnormalities are identified. There are calcifications of the aortic valve. The heart is mildly enlarged. No significant pericardial fluid. Mediastinum/Nodes: There are no enlarged mediastinal, hilar or axillary lymph nodes. The thyroid  gland, trachea and esophagus demonstrate no significant findings. Lungs/Pleura: No pleural effusion or pneumothorax. Central airway thickening with new multifocal patchy ground-glass opacities in both lungs. No confluent airspace disease or suspicious nodularity. Upper abdomen: No significant findings are seen in the visualized upper abdomen status post cholecystectomy. Musculoskeletal/Chest wall: There is no chest wall mass or suspicious osseous finding. Mild multilevel spondylosis. Postsurgical changes at the right shoulder. Review of the MIP images confirms the above findings. IMPRESSION: 1. No evidence of acute pulmonary embolism or other acute vascular findings in the chest. 2. Central airway thickening with new multifocal patchy ground-glass opacities in both lungs, likely infectious or inflammatory. No confluent airspace disease or suspicious nodularity. Recommend  clinical correlation and radiographic follow-up. 3. Central enlargement of the pulmonary arteries consistent with pulmonary arterial hypertension. 4.  Aortic Atherosclerosis (ICD10-I70.0). Electronically Signed   By: Elsie Perone M.D.   On: 10/09/2024 12:39   DG Chest Port 1 View Result Date: 10/09/2024 EXAM: 1 VIEW(S) XRAY OF THE CHEST 10/09/2024 09:46:00 AM COMPARISON: Comparison in 06/19/2022. CLINICAL HISTORY: Pt has complaints of shortness of breath with a productive cough X2 days. FINDINGS: LUNGS AND PLEURA: No focal pulmonary opacity. HEART AND MEDIASTINUM: Cardiac shadow is enlarged but stable. Tortuous thoracic aorta. Aortic atherosclerotic calcifications. BONES AND SOFT TISSUES: No acute osseous abnormality. IMPRESSION: 1. No acute cardiopulmonary process. 2. Borderline cardiomegaly, unchanged from  prior. Electronically signed by: Oneil Devonshire MD 10/09/2024 10:08 AM EDT RP Workstation: [id number]      Subjective:   Discharge Exam: Vitals:   10/11/24 0748 10/11/24 0845  BP:  (!) 155/102  Pulse:  70  Resp:    Temp:    SpO2: 91%    Vitals:   10/10/24 1945 10/11/24 0432 10/11/24 0748 10/11/24 0845  BP: (!) 164/74 (!) 154/77  (!) 155/102  Pulse: 78 69  70  Resp: 19 19    Temp: 98.3 F (36.8 C) 98.2 F (36.8 C)    TempSrc: Oral Oral    SpO2: 97% 95% 91%   Height:        General: Pt is alert, awake, not in acute distress Cardiovascular: RRR, S1/S2 +, no rubs, no gallops Respiratory: CTA bilaterally, no wheezing, no rhonchi Abdominal: Soft, NT, ND, bowel sounds + Extremities: no edema, no cyanosis    The results of significant diagnostics from this hospitalization (including imaging, microbiology, ancillary and laboratory) are listed below for reference.     Microbiology: Recent Results (from the past 240 hours)  Resp panel by RT-PCR (RSV, Flu A&B, Covid) Anterior Nasal Swab     Status: None   Collection Time: 10/09/24  9:26 AM   Specimen: Anterior Nasal Swab   Result Value Ref Range Status   SARS Coronavirus 2 by RT PCR NEGATIVE NEGATIVE Final    Comment: (NOTE) SARS-CoV-2 target nucleic acids are NOT DETECTED.  The SARS-CoV-2 RNA is generally detectable in upper respiratory specimens during the acute phase of infection. The lowest concentration of SARS-CoV-2 viral copies this assay can detect is 138 copies/mL. A negative result does not preclude SARS-Cov-2 infection and should not be used as the sole basis for treatment or other patient management decisions. A negative result may occur with  improper specimen collection/handling, submission of specimen other than nasopharyngeal swab, presence of viral mutation(s) within the areas targeted by this assay, and inadequate number of viral copies(<138 copies/mL). A negative result must be combined with clinical observations, patient history, and epidemiological information. The expected result is Negative.  Fact Sheet for Patients:  bloggercourse.com  Fact Sheet for Healthcare Providers:  seriousbroker.it  This test is no t yet approved or cleared by the [place]  FDA and  has been authorized for detection and/or diagnosis of SARS-CoV-2 by FDA under an Emergency Use Authorization (EUA). This EUA will remain  in effect (meaning this test can be used) for the duration of the COVID-19 declaration under Section 564(b)(1) of the Act, 21 U.S.C.section 360bbb-3(b)(1), unless the authorization is terminated  or revoked sooner.       Influenza A by PCR NEGATIVE NEGATIVE Final   Influenza B by PCR NEGATIVE NEGATIVE Final    Comment: (NOTE) The Xpert Xpress SARS-CoV-2/FLU/RSV plus assay is intended as an aid in the diagnosis of influenza from Nasopharyngeal swab specimens and should not be used as a sole basis for treatment. Nasal washings and aspirates are unacceptable for Xpert Xpress SARS-CoV-2/FLU/RSV testing.  Fact Sheet for  Patients: bloggercourse.com  Fact Sheet for Healthcare Providers: seriousbroker.it  This test is not yet approved or cleared by the [place]  FDA and has been authorized for detection and/or diagnosis of SARS-CoV-2 by FDA under an Emergency Use Authorization (EUA). This EUA will remain in effect (meaning this test can be used) for the duration of the COVID-19 declaration under Section 564(b)(1) of the Act, 21 U.S.C. section 360bbb-3(b)(1), unless the authorization is terminated or revoked.  Resp Syncytial Virus by PCR NEGATIVE NEGATIVE Final    Comment: (NOTE) Fact Sheet for Patients: bloggercourse.com  Fact Sheet for Healthcare Providers: seriousbroker.it  This test is not yet approved or cleared by the [place]  FDA and has been authorized for detection and/or diagnosis of SARS-CoV-2 by FDA under an Emergency Use Authorization (EUA). This EUA will remain in effect (meaning this test can be used) for the duration of the COVID-19 declaration under Section 564(b)(1) of the Act, 21 U.S.C. section 360bbb-3(b)(1), unless the authorization is terminated or revoked.  Performed at [facility], [address], [place], [place] [address]   Blood culture (routine x 2)     Status: None (Preliminary result)   Collection Time: 10/09/24 12:56 PM   Specimen: BLOOD  Result Value Ref Range Status   Specimen Description   Final    BLOOD RIGHT ANTECUBITAL Performed at [facility], [address], [place], [place] [address]    Special Requests   Final    BOTTLES DRAWN AEROBIC AND ANAEROBIC Blood Culture adequate volume Performed at [facility], [address] [place], [place] [address]    Culture  Setup Time   Final    GRAM POSITIVE COCCI IN CLUSTERS ANAEROBIC BOTTLE ONLY CRITICAL RESULT CALLED TO, READ BACK BY AND  VERIFIED WITH: PHARMD E JACKSON 10/11/2024 @ 0055 BY AB Performed at [facility], [address] [place], [place] [address]    Culture GRAM POSITIVE COCCI  Final   Report Status PENDING  Incomplete  Blood Culture ID Panel (Reflexed)     Status: None   Collection Time: 10/09/24 12:56 PM  Result Value Ref Range Status   Enterococcus faecalis NOT DETECTED NOT DETECTED Final   Enterococcus Faecium NOT DETECTED NOT DETECTED Final   Listeria monocytogenes NOT DETECTED NOT DETECTED Final   Staphylococcus species NOT DETECTED NOT DETECTED Final   Staphylococcus aureus (BCID) NOT DETECTED NOT DETECTED Final   Staphylococcus epidermidis NOT DETECTED NOT DETECTED Final   Staphylococcus lugdunensis NOT DETECTED NOT DETECTED Final   Streptococcus species NOT DETECTED NOT DETECTED Final   Streptococcus agalactiae NOT DETECTED NOT DETECTED Final   Streptococcus pneumoniae NOT DETECTED NOT DETECTED Final   Streptococcus pyogenes NOT DETECTED NOT DETECTED Final   A.calcoaceticus-baumannii NOT DETECTED NOT DETECTED Final   Bacteroides fragilis NOT DETECTED NOT DETECTED Final   Enterobacterales NOT DETECTED NOT DETECTED Final   Enterobacter cloacae complex NOT DETECTED NOT DETECTED Final   Escherichia coli NOT DETECTED NOT DETECTED Final   Klebsiella aerogenes NOT DETECTED NOT DETECTED Final   Klebsiella oxytoca NOT DETECTED NOT DETECTED Final   Klebsiella pneumoniae NOT DETECTED NOT DETECTED Final   Proteus species NOT DETECTED NOT DETECTED Final   Salmonella species NOT DETECTED NOT DETECTED Final   Serratia marcescens NOT DETECTED NOT DETECTED Final   Haemophilus influenzae NOT DETECTED NOT DETECTED Final   Neisseria meningitidis NOT DETECTED NOT DETECTED Final   Pseudomonas aeruginosa NOT DETECTED NOT DETECTED Final   Stenotrophomonas maltophilia NOT DETECTED NOT DETECTED Final   Candida albicans NOT DETECTED NOT DETECTED Final   Candida auris NOT DETECTED NOT DETECTED Final   Candida  glabrata NOT DETECTED NOT DETECTED Final   Candida krusei NOT DETECTED NOT DETECTED Final   Candida parapsilosis NOT DETECTED NOT DETECTED Final   Candida tropicalis NOT DETECTED NOT DETECTED Final   Cryptococcus neoformans/gattii NOT DETECTED NOT DETECTED Final    Comment: Performed at [facility],  [address] [place], [place] [address]  Blood culture (routine x 2)     Status: None (Preliminary result)   Collection Time: 10/09/24  3:15 PM   Specimen: BLOOD LEFT HAND  Result Value Ref Range Status   Specimen Description   Final    BLOOD LEFT HAND Performed at [facility], [address], [place], [place] [address]    Special Requests   Final    BOTTLES DRAWN AEROBIC ONLY Blood Culture results may not be optimal due to an inadequate volume of blood received in culture bottles Performed at [facility], [address] [place], [place] [address]    Culture   Final    NO GROWTH 2 DAYS Performed at [facility], [address] [place], [place] [address]    Report Status PENDING  Incomplete  MRSA Next Gen by PCR, Nasal     Status: None   Collection Time: 10/09/24  3:58 PM   Specimen: Nasal Mucosa; Nasal Swab  Result Value Ref Range Status   MRSA by PCR Next Gen NOT DETECTED NOT DETECTED Final    Comment: (NOTE) The GeneXpert MRSA Assay (FDA approved for NASAL specimens only), is one component of a comprehensive MRSA colonization surveillance program. It is not intended to diagnose MRSA infection nor to guide or monitor treatment for MRSA infections. Test performance is not FDA approved in patients less than 53 years old. Performed at [facility], [address], [place], [place] [address]      Labs: BNP (last 3 results) No results for input(s): BNP in the last 8760 hours. Basic Metabolic Panel: Recent Labs  Lab 10/09/24 0913 10/10/24 0605  NA 137 139  K 3.4* 3.6  CL 101 104  CO2 26 25  GLUCOSE 101* 105*   BUN 17 14  CREATININE 0.80 0.68  CALCIUM  8.7* 8.7*   Liver Function Tests: Recent Labs  Lab 10/09/24 0913  AST 16  ALT <5  ALKPHOS 91  BILITOT 0.4  PROT 7.2  ALBUMIN 3.7   No results for input(s): LIPASE, AMYLASE in the last 168 hours. No results for input(s): AMMONIA in the last 168 hours. CBC: Recent Labs  Lab 10/09/24 0913 10/10/24 0605  WBC 14.6* 11.3*  NEUTROABS 10.6*  --   HGB 11.1* 10.6*  HCT 35.2* 33.2*  MCV 95.4 96.2  PLT 195 205   Cardiac Enzymes: No results for input(s): CKTOTAL, CKMB, CKMBINDEX, TROPONINI in the last 168 hours. BNP: Invalid input(s): POCBNP CBG: No results for input(s): GLUCAP in the last 168 hours. D-Dimer No results for input(s): DDIMER in the last 72 hours. Hgb A1c No results for input(s): HGBA1C in the last 72 hours. Lipid Profile No results for input(s): CHOL, HDL, LDLCALC, TRIG, CHOLHDL, LDLDIRECT in the last 72 hours. Thyroid  function studies No results for input(s): TSH, T4TOTAL, T3FREE, THYROIDAB in the last 72 hours.  Invalid input(s): FREET3 Anemia work up No results for input(s): VITAMINB12, FOLATE, FERRITIN, TIBC, IRON, RETICCTPCT in the last 72 hours. Urinalysis    Component Value Date/Time   COLORURINE YELLOW 06/19/2022 0039   APPEARANCEUR HAZY (A) 06/19/2022 0039   LABSPEC 1.021 06/19/2022 0039   PHURINE 5.0 06/19/2022 0039   GLUCOSEU NEGATIVE 06/19/2022 0039   HGBUR SMALL (A) 06/19/2022 0039   BILIRUBINUR NEGATIVE 06/19/2022 0039   KETONESUR 20 (A) 06/19/2022 0039   PROTEINUR NEGATIVE 06/19/2022 0039   UROBILINOGEN 0.2 08/24/2015 2040   NITRITE NEGATIVE 06/19/2022 0039  LEUKOCYTESUR NEGATIVE 06/19/2022 0039   Sepsis Labs Recent Labs  Lab 10/09/24 0913 10/10/24 0605  WBC 14.6* 11.3*   Microbiology Recent Results (from the past 240 hours)  Resp panel by RT-PCR (RSV, Flu A&B, Covid) Anterior Nasal Swab     Status: None   Collection Time:  10/09/24  9:26 AM   Specimen: Anterior Nasal Swab  Result Value Ref Range Status   SARS Coronavirus 2 by RT PCR NEGATIVE NEGATIVE Final    Comment: (NOTE) SARS-CoV-2 target nucleic acids are NOT DETECTED.  The SARS-CoV-2 RNA is generally detectable in upper respiratory specimens during the acute phase of infection. The lowest concentration of SARS-CoV-2 viral copies this assay can detect is 138 copies/mL. A negative result does not preclude SARS-Cov-2 infection and should not be used as the sole basis for treatment or other patient management decisions. A negative result may occur with  improper specimen collection/handling, submission of specimen other than nasopharyngeal swab, presence of viral mutation(s) within the areas targeted by this assay, and inadequate number of viral copies(<138 copies/mL). A negative result must be combined with clinical observations, patient history, and epidemiological information. The expected result is Negative.  Fact Sheet for Patients:  bloggercourse.com  Fact Sheet for Healthcare Providers:  seriousbroker.it  This test is no t yet approved or cleared by the [place]  FDA and  has been authorized for detection and/or diagnosis of SARS-CoV-2 by FDA under an Emergency Use Authorization (EUA). This EUA will remain  in effect (meaning this test can be used) for the duration of the COVID-19 declaration under Section 564(b)(1) of the Act, 21 U.S.C.section 360bbb-3(b)(1), unless the authorization is terminated  or revoked sooner.       Influenza A by PCR NEGATIVE NEGATIVE Final   Influenza B by PCR NEGATIVE NEGATIVE Final    Comment: (NOTE) The Xpert Xpress SARS-CoV-2/FLU/RSV plus assay is intended as an aid in the diagnosis of influenza from Nasopharyngeal swab specimens and should not be used as a sole basis for treatment. Nasal washings and aspirates are unacceptable for Xpert Xpress  SARS-CoV-2/FLU/RSV testing.  Fact Sheet for Patients: bloggercourse.com  Fact Sheet for Healthcare Providers: seriousbroker.it  This test is not yet approved or cleared by the [place]  FDA and has been authorized for detection and/or diagnosis of SARS-CoV-2 by FDA under an Emergency Use Authorization (EUA). This EUA will remain in effect (meaning this test can be used) for the duration of the COVID-19 declaration under Section 564(b)(1) of the Act, 21 U.S.C. section 360bbb-3(b)(1), unless the authorization is terminated or revoked.     Resp Syncytial Virus by PCR NEGATIVE NEGATIVE Final    Comment: (NOTE) Fact Sheet for Patients: bloggercourse.com  Fact Sheet for Healthcare Providers: seriousbroker.it  This test is not yet approved or cleared by the [place]  FDA and has been authorized for detection and/or diagnosis of SARS-CoV-2 by FDA under an Emergency Use Authorization (EUA). This EUA will remain in effect (meaning this test can be used) for the duration of the COVID-19 declaration under Section 564(b)(1) of the Act, 21 U.S.C. section 360bbb-3(b)(1), unless the authorization is terminated or revoked.  Performed at [facility], [address], [place], [place] [address]   Blood culture (routine x 2)     Status: None (Preliminary result)   Collection Time: 10/09/24 12:56 PM   Specimen: BLOOD  Result Value Ref Range Status   Specimen Description   Final    BLOOD RIGHT ANTECUBITAL Performed at [facility], [address] [place], [place] [address]    Special Requests   Final    BOTTLES DRAWN AEROBIC AND ANAEROBIC Blood Culture adequate volume Performed at [facility], [address], [place], [place] [address]    Culture  Setup Time   Final    GRAM POSITIVE COCCI IN CLUSTERS ANAEROBIC BOTTLE  ONLY CRITICAL RESULT CALLED TO, READ BACK BY AND VERIFIED WITH: PHARMD E JACKSON 10/11/2024 @ 0055 BY AB Performed at [facility], [address] [place], [place] [address]    Culture GRAM POSITIVE COCCI  Final   Report Status PENDING  Incomplete  Blood Culture ID Panel (Reflexed)     Status: None   Collection Time: 10/09/24 12:56 PM  Result Value Ref Range Status   Enterococcus faecalis NOT DETECTED NOT DETECTED Final   Enterococcus Faecium NOT DETECTED NOT DETECTED Final   Listeria monocytogenes NOT DETECTED NOT DETECTED Final   Staphylococcus species NOT DETECTED NOT DETECTED Final   Staphylococcus aureus (BCID) NOT DETECTED NOT DETECTED Final   Staphylococcus epidermidis NOT DETECTED NOT DETECTED Final   Staphylococcus lugdunensis NOT DETECTED NOT DETECTED Final   Streptococcus species NOT DETECTED NOT DETECTED Final   Streptococcus agalactiae NOT DETECTED NOT DETECTED Final   Streptococcus pneumoniae NOT DETECTED NOT DETECTED Final   Streptococcus pyogenes NOT DETECTED NOT DETECTED Final   A.calcoaceticus-baumannii NOT DETECTED NOT DETECTED Final   Bacteroides fragilis NOT DETECTED NOT DETECTED Final   Enterobacterales NOT DETECTED NOT DETECTED Final   Enterobacter cloacae complex NOT DETECTED NOT DETECTED Final   Escherichia coli NOT DETECTED NOT DETECTED Final   Klebsiella aerogenes NOT DETECTED NOT DETECTED Final   Klebsiella oxytoca NOT DETECTED NOT DETECTED Final   Klebsiella pneumoniae NOT DETECTED NOT DETECTED Final   Proteus species NOT DETECTED NOT DETECTED Final   Salmonella species NOT DETECTED NOT DETECTED Final   Serratia marcescens NOT DETECTED NOT DETECTED Final   Haemophilus influenzae NOT DETECTED NOT DETECTED Final   Neisseria meningitidis NOT DETECTED NOT DETECTED Final   Pseudomonas aeruginosa NOT DETECTED NOT DETECTED Final   Stenotrophomonas maltophilia NOT DETECTED NOT DETECTED Final   Candida albicans NOT DETECTED NOT DETECTED Final   Candida  auris NOT DETECTED NOT DETECTED Final   Candida glabrata NOT DETECTED NOT DETECTED Final   Candida krusei NOT DETECTED NOT DETECTED Final   Candida parapsilosis NOT DETECTED NOT DETECTED Final   Candida tropicalis NOT DETECTED NOT DETECTED Final   Cryptococcus neoformans/gattii NOT DETECTED NOT DETECTED Final    Comment: Performed at [facility], [address], [place], [place] [address]  Blood culture (routine x 2)     Status: None (Preliminary result)   Collection Time: 10/09/24  3:15 PM   Specimen: BLOOD LEFT HAND  Result Value Ref Range Status   Specimen Description   Final    BLOOD LEFT HAND Performed at [facility], [address], [place], [place] [address]    Special Requests   Final    BOTTLES DRAWN AEROBIC ONLY Blood Culture results may not be optimal due to an inadequate volume of blood received in culture bottles Performed at [facility], [address], [place], [place] [address]    Culture   Final    NO GROWTH 2 DAYS Performed at [facility], [address] [place], [place] [address]    Report Status PENDING  Incomplete  MRSA Next Gen by PCR, Nasal  Status: None   Collection Time: 10/09/24  3:58 PM   Specimen: Nasal Mucosa; Nasal Swab  Result Value Ref Range Status   MRSA by PCR Next Gen NOT DETECTED NOT DETECTED Final    Comment: (NOTE) The GeneXpert MRSA Assay (FDA approved for NASAL specimens only), is one component of a comprehensive MRSA colonization surveillance program. It is not intended to diagnose MRSA infection nor to guide or monitor treatment for MRSA infections. Test performance is not FDA approved in patients less than 36 years old. Performed at [facility], [address], [place], [place] [address]     Please note: You were cared for by a hospitalist during your hospital stay. Once you are discharged, your primary care physician will handle any further medical issues. Please note  that NO REFILLS for any discharge medications will be authorized once you are discharged, as it is imperative that you return to your primary care physician (or establish a relationship with a primary care physician if you do not have one) for your post hospital discharge needs so that they can reassess your need for medications and monitor your lab values.    Time coordinating discharge: 40 minutes  SIGNED:   Ivonne Mustache, MD  [facility] 10/11/2024, 10:55 AM Pager [phone]  If 7PM-7AM, please contact night-coverage [email or web address] Password [email or web address]

## 2024-10-11 NOTE — Plan of Care (Signed)

## 2024-10-11 NOTE — Progress Notes (Signed)
 Discharge medications delivered to patient at the bedside in a secure bag.

## 2024-10-11 NOTE — Progress Notes (Signed)
 Mobility Specialist Progress Note:   10/11/24 1110  Mobility  Activity Ambulated with assistance  Level of Assistance Minimal assist, patient does 75% or more  Assistive Device Front wheel walker  Distance Ambulated (ft) 180 ft  Activity Response Tolerated well  Mobility Referral Yes  Mobility visit 1 Mobility  Mobility Specialist Start Time (ACUTE ONLY) 1020  Mobility Specialist Stop Time (ACUTE ONLY) 1050  Mobility Specialist Time Calculation (min) (ACUTE ONLY) 30 min   Pt was received in recliner and agreed to mobility. Min A sit to stand. 1x small standing rest break during ambulation. Returned to recliner with all needs met. Call bell in reach.  [employer] - Mobility Specialist

## 2024-10-12 DIAGNOSIS — R051 Acute cough: Secondary | ICD-10-CM | POA: Diagnosis not present

## 2024-10-12 DIAGNOSIS — R0981 Nasal congestion: Secondary | ICD-10-CM | POA: Diagnosis not present

## 2024-10-12 LAB — LEGIONELLA PNEUMOPHILA SEROGP 1 UR AG: L. pneumophila Serogp 1 Ur Ag: NEGATIVE

## 2024-10-12 LAB — CULTURE, BLOOD (ROUTINE X 2): Special Requests: ADEQUATE

## 2024-10-14 LAB — CULTURE, BLOOD (ROUTINE X 2): Culture: NO GROWTH

## 2024-10-15 ENCOUNTER — Encounter: Payer: Self-pay | Admitting: Radiology

## 2024-10-16 DIAGNOSIS — K219 Gastro-esophageal reflux disease without esophagitis: Secondary | ICD-10-CM | POA: Diagnosis not present

## 2024-10-16 DIAGNOSIS — G4700 Insomnia, unspecified: Secondary | ICD-10-CM | POA: Diagnosis not present

## 2024-10-16 DIAGNOSIS — D6869 Other thrombophilia: Secondary | ICD-10-CM | POA: Diagnosis not present

## 2024-10-16 DIAGNOSIS — I10 Essential (primary) hypertension: Secondary | ICD-10-CM | POA: Diagnosis not present

## 2024-10-16 DIAGNOSIS — G20A1 Parkinson's disease without dyskinesia, without mention of fluctuations: Secondary | ICD-10-CM | POA: Diagnosis not present

## 2024-10-16 DIAGNOSIS — J188 Other pneumonia, unspecified organism: Secondary | ICD-10-CM | POA: Diagnosis not present

## 2024-10-16 DIAGNOSIS — G4733 Obstructive sleep apnea (adult) (pediatric): Secondary | ICD-10-CM | POA: Diagnosis not present

## 2024-10-16 DIAGNOSIS — R2681 Unsteadiness on feet: Secondary | ICD-10-CM | POA: Diagnosis not present

## 2024-10-16 DIAGNOSIS — F3342 Major depressive disorder, recurrent, in full remission: Secondary | ICD-10-CM | POA: Diagnosis not present

## 2024-10-22 NOTE — Progress Notes (Unsigned)
 Assessment/Plan:   1.  ET/PD  -Continue carbidopa /levodopa  25/100 2 tablets at 9am/2 tablets at 1pm/1 tablet at 5pm.    -On propranolol , 10 mg twice per day.  Higher dosages with more fatigue  -DaTscan  was abnormal in October, 2021.  - She is very resistant to exercise, both through our free programs and through [facility]. 2.  Recurrent DVT/PE  -On Eliquis  3.  RBD  -Hold Tylenol  PM.    -worsening.  -lexapro  may contribute but they have just increased the dose recently so it wasn't the dose increase that caused the worsening of dreams.  Certainly Parkinsons Disease may contribute.   - *** klonopin  0.5 mg, 1/2 po q hs.  Discussed the risks and benefits of this, including causing grogginess in the middle of the night and its potential for hangover effect (although she sleeps quite late in the morning).  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  I was more worried about making her too groggy in the middle of the night when she wakes up to go to the bathroom, but she only does this 1 time.  Both her son and her are agreeable to trying it and letting me know.  - She is following with sleep medicine at [facility]. 4.  Dysphagia  -MBE in October, 2022 with minimal oropharyngeal dysphagia.  Regular diet with thin liquid recommended. 5.  Severe sleep apnea, AHI 82.5  -on CPAP and following with [facility] 6.  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 7.  Low back pain  -Following with orthopedics 8.  EDS  -don't think related to Parkinsons Disease as its EDS and not fatigue  -f/u Dr. Buck Subjective:   [name] was seen today in follow up for ET/Parkinsons disease.  My previous records were reviewed prior to todays visit as well as outside records available to me.  She is accompanied by her son who supplements the history.  Last  visit, clonazepam  was added to her medication regimen cautiously because of RBD.  She only picked up the medication 1 time, which was the original prescription.  She reports today that ***separately, patient was recently in the hospital for a few days for community-acquired pneumonia.  She was treated with broad-spectrum antibiotics with improvement.  Current prescribed movement disorder medications: Carbidopa /levodopa  25/100, 2 tablets at 9am/2 tablets at 1pm/1 tablet at 5pm  Propranolol  10 mg twice per day  Aricept , 10 mg daily (prescribed by primary care)  PREVIOUS MEDICATIONS: Topiramate , memory change; propranolol  (higher dosages with more fatigue)  ALLERGIES:   Allergies  Allergen Reactions   Buprenorphine Hcl Nausea And Vomiting   Codeine Nausea And Vomiting    But tolerates Tramadol     Morphine Nausea And Vomiting   Procaine     Novacaine-Shakes    CURRENT MEDICATIONS:  Outpatient Encounter Medications as of 10/23/2024  Medication Sig   acetaminophen  (TYLENOL ) 650 MG CR tablet Take 650 mg by mouth every 8 (eight) hours as needed for pain.   albuterol  (VENTOLIN  HFA) 108 (90 Base) MCG/ACT inhaler Inhale 2 puffs into the lungs every 6 (six) hours as needed for wheezing or shortness of breath.   apixaban  (ELIQUIS ) 5 MG TABS tablet Take 1 tablet (5 mg total) by mouth 2 (two) times daily.   busPIRone  (BUSPAR ) 10 MG tablet Take 10 mg by mouth 2 (  two) times daily.    carbidopa -levodopa  (SINEMET  IR) 25-100 MG tablet TAKE 2 TABLETS BY MOUTH DAILY AT 9 A.M. THEN 2 TABLETS AT 1 P.M. THEN 1 TABLET AT 5 P.M. AS DIRECTED (Patient taking differently: Take 1-3 tablets by mouth See admin instructions. Take 2 tablets by mouth every morning, 3 tablets during the day, and 1 tablet at bedtime.)   cholecalciferol  (VITAMIN D ) 25 MCG (1000 UNIT) tablet Take 1,000 Units by mouth daily.   clonazePAM  (KLONOPIN ) 0.5 MG tablet Take 0.5 tablets (0.25 mg total) by mouth at bedtime.    diphenhydramine -acetaminophen  (TYLENOL  PM) 25-500 MG TABS tablet Take 1 tablet by mouth at bedtime as needed (Sleep/pain).   donepezil  (ARICEPT ) 10 MG tablet Take 10 mg by mouth in the morning.   escitalopram  (LEXAPRO ) 20 MG tablet Take 20 mg by mouth daily.   esomeprazole (NEXIUM) 40 MG capsule Take 40 mg by mouth 2 (two) times daily before a meal.    furosemide (LASIX) 40 MG tablet Take 40 mg by mouth daily.   ipratropium (ATROVENT) 0.06 % nasal spray Place 2 sprays into both nostrils 2 (two) times daily as needed for rhinitis.   loratadine  (CLARITIN ) 10 MG tablet Take 10 mg by mouth at bedtime.   mirabegron  ER (MYRBETRIQ ) 50 MG TB24 tablet Take 50 mg by mouth at bedtime.    nitrofurantoin  (MACRODANTIN ) 100 MG capsule Take 100 mg by mouth daily.   NON FORMULARY Pt uses a cpap nightly   olmesartan (BENICAR) 20 MG tablet Take 20 mg by mouth in the morning and at bedtime.    propranolol  (INDERAL ) 10 MG tablet TAKE 1 TABLET BY MOUTH 2 TIMES A DAY   saccharomyces boulardii (FLORASTOR) 250 MG capsule Take 250 mg by mouth 2 (two) times daily.   vitamin B-12 (CYANOCOBALAMIN ) 1000 MCG tablet Take 1,000 mcg by mouth daily.   [DISCONTINUED] carvedilol (COREG) 6.25 MG tablet    No facility-administered encounter medications on file as of 10/23/2024.    Objective:   PHYSICAL EXAMINATION:    VITALS:   There were no vitals filed for this visit.   GEN:  The patient appears stated age and is in NAD. HEENT:  Normocephalic, atraumatic.  The mucous membranes are moist.  CV:  RRR Lungs:  CTAB Neck:  no bruits  Neurological examination:  Orientation: The patient is alert and oriented x3. Cranial nerves: There is good facial symmetry without facial hypomimia. The speech is fluent and clear. Soft palate rises symmetrically and there is no tongue deviation. Hearing is intact to conversational tone. Sensation: Sensation is intact to light touch throughout Motor: Strength is at least antigravity  x4.  Movement examination: Tone: There is mild increased tone in the RUE Abnormal movements: there is no rest tremor today.  There is mild postural tremor bilaterally Coordination:  There is no decremation, with any form of RAMS, including alternating supination and pronation of the forearm, hand opening and closing, finger taps, heel taps and toe taps. Gait and Station: The patient has pushes off to arise.  She ambulates slow with the walker and is a bit antalgic.  She is forward flexed.    I have reviewed and interpreted the following labs independently    Chemistry      Component Value Date/Time   NA 139 10/10/2024 0605   K 3.6 10/10/2024 0605   CL 104 10/10/2024 0605   CO2 25 10/10/2024 0605   BUN 14 10/10/2024 0605   CREATININE 0.68 10/10/2024 9394  Component Value Date/Time   CALCIUM  8.7 (L) 10/10/2024 0605   ALKPHOS 91 10/09/2024 0913   AST 16 10/09/2024 0913   ALT <5 10/09/2024 0913   BILITOT 0.4 10/09/2024 0913       Lab Results  Component Value Date   WBC 11.3 (H) 10/10/2024   HGB 10.6 (L) 10/10/2024   HCT 33.2 (L) 10/10/2024   MCV 96.2 10/10/2024   PLT 205 10/10/2024    Lab Results  Component Value Date   TSH 1.749 05/18/2018   No results found for: VITAMINB12   Total time spent on today's visit was *** minutes, including both face-to-face time and nonface-to-face time.  Time included that spent on review of records (prior notes available to me/labs/imaging if pertinent), discussing treatment and goals, answering patient's questions and coordinating care.  Cc:  Loreli Elsie JONETTA Mickey., MD

## 2024-10-23 ENCOUNTER — Ambulatory Visit: Admitting: Neurology

## 2024-10-23 VITALS — BP 132/86 | HR 88 | Wt 218.8 lb

## 2024-10-23 DIAGNOSIS — G4752 REM sleep behavior disorder: Secondary | ICD-10-CM | POA: Diagnosis not present

## 2024-10-23 DIAGNOSIS — G4733 Obstructive sleep apnea (adult) (pediatric): Secondary | ICD-10-CM | POA: Diagnosis not present

## 2024-10-23 DIAGNOSIS — G20A1 Parkinson's disease without dyskinesia, without mention of fluctuations: Secondary | ICD-10-CM | POA: Diagnosis not present

## 2024-12-03 ENCOUNTER — Telehealth: Payer: Self-pay | Admitting: Neurology

## 2024-12-03 NOTE — Telephone Encounter (Signed)
 Pt has a question regarding her Sleeping Rx Trazodone , her PCP asked to make sure it was ok for her to use with other Rx's she is taking

## 2024-12-04 NOTE — Telephone Encounter (Signed)
 Called patient did not receive an answer left a detailed message to check with prescriber if the Trazodone  interacts with her meds. I did tell patient it does not interact with carbidopa  levodopa  and to reach out to PCP

## 2024-12-08 ENCOUNTER — Other Ambulatory Visit: Payer: Self-pay | Admitting: Neurology

## 2024-12-08 DIAGNOSIS — G20A1 Parkinson's disease without dyskinesia, without mention of fluctuations: Secondary | ICD-10-CM

## 2025-01-01 ENCOUNTER — Telehealth: Payer: Self-pay | Admitting: Neurology

## 2025-01-01 NOTE — Telephone Encounter (Signed)
 Patient called in to cancel appt with Sarah for 1/29 and wanted to let Lauraine know that she has not used her CPAP for the last week but she still wants to know about her data from before she stopped using it. Lauraine, would you be okay with seeing her for a VV to discuss her CPAP data?

## 2025-01-01 NOTE — Telephone Encounter (Signed)
 Spoke with pt and scheduled for a VV with Sarah for 01/09/25 at 10:15am

## 2025-01-08 NOTE — Progress Notes (Unsigned)
 [name]

## 2025-01-09 ENCOUNTER — Telehealth: Admitting: Neurology

## 2025-01-09 DIAGNOSIS — G4733 Obstructive sleep apnea (adult) (pediatric): Secondary | ICD-10-CM

## 2025-01-09 NOTE — Patient Instructions (Signed)
 Recommend CPAP use Recommend nightly use minimum 4 hours  Can consider mask refit  Let me know your decision  Follow up with your primary care doctor

## 2025-01-09 NOTE — Progress Notes (Signed)
 "  Patient: [name] Date of Birth: [birth year]  Reason for Visit: Follow up History from: Patient Primary Neurologist: Buck   Virtual Visit via Video Note  I connected with [name] on 01/09/25 at 10:15 AM EST by a video enabled telemedicine application and verified that I am speaking with the correct person using two identifiers.  Location: Patient: at her home Provider: in the office    I discussed the limitations of evaluation and management by telemedicine and the availability of in person appointments. The patient expressed understanding and agreed to proceed.  ASSESSMENT AND PLAN 84 y.o. year old female   1.  OSA on CPAP  -Has stopped using CPAP for the last few weeks.  Claims it was irritating her.  We discussed a mask refit, adjusting the pressure.  Right now she is not sure if she wants to continue using it.  I do recommend she resume CPAP.  We reviewed her prior HST showed severe sleep apnea.  I think she stands to significantly benefit from CPAP use and I highly recommended.  She is going to discuss with her primary care and let me know her decision. -HST September 2023 showed severe OSA with AHI 82.5/hour, O2 nadir 84%.  Started new CPAP 09/09/2022.  HISTORY OF PRESENT ILLNESS: Today 01/09/25 01/09/25 SS: VV, stopped using her CPAP, tried 3 different masks. Claims it was an irritation, wasn't sleeping with it on. She could hear herself breath, it was irritating to her.  She last tried using nasal pillow mask, but causes skin breakdown on her nostril.  90-day CPAP report shows 81% usage, greater than 4 hours 68%.  Leak 9.9, AHI 1.0.  Living at [facility] in the independent living community.  Follows with Dr. Evonnie for Parkinson's.  We reviewed her previous HST that showed severe sleep apnea.  She indicates she is not sure she wants to resume CPAP, is going to discuss with her primary care.  01/11/24 SS: Here with her daughter for follow up on CPAP. She is trying to  like it, laughs about it.  Is very diligent about nightly use.  Is using a nasal air fit medium mask.  Sometimes the mask around her nose feels too tight.  Last night she loosened it a lot and slept great.  The tubing is aggravating to her. Is cumbersome to open the water  chamber for her. With her tremor is hard to open and close. Trying to get on Zepbound , going through insurance.  Continues with afternoon fatigue.  Remains on Sinemet  and propranolol  from Dr. Evonnie.  ESS 10.  Attached download shows 100% compliance, AHI 0.4.  Leak 15.2.  The data overall looks very good from the CPAP compliance and settings.  HISTORY  11/09/22 Dr. Buck: [name] is an 84 year old right-handed woman with an underlying medical history of arthritis, degenerative disc disease, diverticulosis, history of DVT, reflux disease, hypertension, osteoporosis, tremor and parkinsonism (followed by Dr. Evonnie at [facility]), and severe obesity with a BMI over 40, who presents for follow-up consultation of her obstructive sleep apnea after interim testing and starting treatment with a new AutoPap machine.  The patient is accompanied by her daughter again today.  I saw her on 07/29/2022 for reevaluation of her sleep apnea.  She had not used her PAP machine in about 3 years at the time.  We proceeded with a home sleep test.  She had a home sleep test on 08/30/2022 which indicated severe obstructive sleep apnea with an  AHI of 82.5/h, O2 nadir 84%, snoring was in the mild to moderate range, at times louder.  Time below or at 88% saturation was over 30 minutes for the night.  She was advised to proceed with home AutoPap therapy.  Her set up date was 09/09/2022.  She has a ResMed air sense 10 AutoSet machine.  Her DME company is [employer].   Today, 11/09/2022 (all dictated new, as well as above notes, some dictation done in note pad or Word, outside of chart, may appear as copied):    I reviewed her AutoPap compliance data from 10/09/2022 through  11/07/2022, which is a total of 30 days, during which time she used her machine every night with percent use days greater than 4 hours at 100%, indicating superb compliance, average usage of 8 hours and 39 minutes, residual AHI at goal at 0.5/h, average pressure for the 95th percentile at 10.1 cm with a range of 5 to 11 cm with EPR of 3.  Leak acceptable with the 95th percentile at 10 L/min.  She reports having adjusted fairly well to AutoPap therapy.  She has benefited from treatment, feels less sleepy during the day but still has some residual tiredness, Epworth sleepiness score is 9 out of 24 today.  She is motivated to continue with treatment, uses a ResMed N20 nasal mask.  She hydrates well with water .  REVIEW OF SYSTEMS: Out of a complete 14 system review of symptoms, the patient complains only of the following symptoms, and all other reviewed systems are negative.  See HPI  ALLERGIES: Allergies  Allergen Reactions   Buprenorphine Hcl Nausea And Vomiting   Codeine Nausea And Vomiting    But tolerates Tramadol     Morphine Nausea And Vomiting   Procaine     Novacaine-Shakes    HOME MEDICATIONS: Outpatient Medications Prior to Visit  Medication Sig Dispense Refill   acetaminophen  (TYLENOL ) 650 MG CR tablet Take 650 mg by mouth every 8 (eight) hours as needed for pain.     albuterol  (VENTOLIN  HFA) 108 (90 Base) MCG/ACT inhaler Inhale 2 puffs into the lungs every 6 (six) hours as needed for wheezing or shortness of breath.     apixaban  (ELIQUIS ) 5 MG TABS tablet Take 1 tablet (5 mg total) by mouth 2 (two) times daily. 60 tablet 0   busPIRone  (BUSPAR ) 10 MG tablet Take 10 mg by mouth 2 (two) times daily.      carbidopa -levodopa  (SINEMET  IR) 25-100 MG tablet TAKE 2 TABLETS BY MOUTH DAILY AT 9AM. THEN TAKE 2 TABLETS BY MOUTH AT 1PM. THEN TAKE 1 TABLET BY MOUTH AT 5PM AS DIRECTED 450 tablet 0   cholecalciferol  (VITAMIN D ) 25 MCG (1000 UNIT) tablet Take 1,000 Units by mouth daily.      clonazePAM  (KLONOPIN ) 0.5 MG tablet Take 0.5 tablets (0.25 mg total) by mouth at bedtime. 45 tablet 1   diphenhydramine -acetaminophen  (TYLENOL  PM) 25-500 MG TABS tablet Take 1 tablet by mouth at bedtime as needed (Sleep/pain).     donepezil  (ARICEPT ) 10 MG tablet Take 10 mg by mouth in the morning.     escitalopram  (LEXAPRO ) 20 MG tablet Take 20 mg by mouth daily.     esomeprazole (NEXIUM) 40 MG capsule Take 40 mg by mouth 2 (two) times daily before a meal.      furosemide  (LASIX ) 40 MG tablet Take 40 mg by mouth daily.     ipratropium (ATROVENT) 0.06 % nasal spray Place 2 sprays into both nostrils 2 (two) times  daily as needed for rhinitis.     loratadine  (CLARITIN ) 10 MG tablet Take 10 mg by mouth at bedtime.     mirabegron  ER (MYRBETRIQ ) 50 MG TB24 tablet Take 50 mg by mouth at bedtime.      nitrofurantoin  (MACRODANTIN ) 100 MG capsule Take 100 mg by mouth daily.     NON FORMULARY Pt uses a cpap nightly     olmesartan (BENICAR) 20 MG tablet Take 20 mg by mouth in the morning and at bedtime.      propranolol  (INDERAL ) 10 MG tablet TAKE 1 TABLET BY MOUTH 2 TIMES A DAY 180 tablet 0   saccharomyces boulardii (FLORASTOR) 250 MG capsule Take 250 mg by mouth 2 (two) times daily.     vitamin B-12 (CYANOCOBALAMIN ) 1000 MCG tablet Take 1,000 mcg by mouth daily.     No facility-administered medications prior to visit.    PAST MEDICAL HISTORY: Past Medical History:  Diagnosis Date   Arthritis    arthritis in joints and back   Benign familial tremor    Clostridium difficile infection    DDD (degenerative disc disease), lumbar    Diverticulosis    DVT (deep venous thrombosis) (HCC)    left leg   Dyspnea    Gallstones    GERD (gastroesophageal reflux disease)    Hypertension    Neuromuscular disorder (HCC)    Parkinson's   Osteoporosis    Sleep apnea    Wears glasses     PAST SURGICAL HISTORY: Past Surgical History:  Procedure Laterality Date   ABDOMINAL HYSTERECTOMY  1983   arterial  vascular fistula  2004   coiled at [facility]   BLADDER REPAIR  [phone]   CARPAL TUNNEL RELEASE  right   CHOLECYSTECTOMY N/A 03/29/2013   Procedure: LAPAROSCOPIC CHOLECYSTECTOMY WITH INTRAOPERATIVE CHOLANGIOGRAM;  Surgeon: Sherlean JINNY Laughter, MD;  Location: [facility];  Service: General;  Laterality: N/A;   CYSTOSCOPY N/A 05/15/2024   Procedure: PHYLLIS;  Surgeon: Gaston Hamilton, MD;  Location: [facility] OR;  Service: Urology;  Laterality: N/A;   CYSTOSCOPY WITH INJECTION N/A 11/01/2023   Procedure: CYSTOSCOPY WITH INJECTION BULKAMID INJECTION;  Surgeon: Gaston Hamilton, MD;  Location: [facility];  Service: Urology;  Laterality: N/A;  30 MINS FOR CASE   EUS N/A 03/09/2013   Procedure: UPPER ENDOSCOPIC ULTRASOUND (EUS) LINEAR;  Surgeon: Belvie JONETTA Just, MD;  Location: [facility];  Service: Endoscopy;  Laterality: N/A;   INJECTION, BULKING AGENT, URETHRA N/A 05/15/2024   Procedure: INJECTION, BULKING AGENT, URETHRA;  Surgeon: Gaston Hamilton, MD;  Location: [facility] OR;  Service: Urology;  Laterality: N/A;  BULKAMID   LUMBAR LAMINECTOMY/DECOMPRESSION MICRODISCECTOMY  11/30/2012   Procedure: LUMBAR LAMINECTOMY/DECOMPRESSION MICRODISCECTOMY 2 LEVELS;  Surgeon: Lynwood SHAUNNA Bern, MD;  Location: [facility];  Service: Orthopedics;  Laterality: N/A;  DECOMPRESSION LAMINECTOMY L4-L5, L5-S1 CENTRAL    ROTATOR CUFF REPAIR Bilateral 2004   two on right, one on left   TOTAL KNEE ARTHROPLASTY Left 06/06/2020   Procedure: LEFT TOTAL KNEE ARTHROPLASTY;  Surgeon: Vernetta Lonni GRADE, MD;  Location: [facility];  Service: Orthopedics;  Laterality: Left;   TRIGGER FINGER RELEASE Right     hand    FAMILY HISTORY: Family History  Problem Relation Age of Onset   Other Mother        sarcoma   Other Father        broken hip   Dementia Father    Pneumonia Father    Sleep apnea Neg Hx  SOCIAL HISTORY: Social History   Socioeconomic History   Marital status: Widowed    Spouse name: Richard   Number of  children: 2   Years of education: 19   Highest education level: Not on file  Occupational History   Occupation: network engineer    Comment: partially retired  Tobacco Use   Smoking status: Former    Current packs/day: 0.00    Average packs/day: 0.5 packs/day    Types: Cigarettes    Quit date: 07/11/1970    Years since quitting: 54.5   Smokeless tobacco: Never  Vaping Use   Vaping status: Never Used  Substance and Sexual Activity   Alcohol  use: Yes    Alcohol /week: 0.0 standard drinks of alcohol     Comment: wine occasionally   Drug use: No   Sexual activity: Not Currently  Other Topics Concern   Not on file  Social History Narrative   Patient is right handed, resides Alone.   1 cup of coffee a day    Social Drivers of Health   Tobacco Use: Medium Risk (10/09/2024)   Patient History    Smoking Tobacco Use: Former    Smokeless Tobacco Use: Never    Passive Exposure: Not on Actuary Strain: Not on file  Food Insecurity: No Food Insecurity (10/09/2024)   Epic    Worried About Programme Researcher, Broadcasting/film/video in the Last Year: Never true    Ran Out of Food in the Last Year: Never true  Transportation Needs: No Transportation Needs (10/09/2024)   Epic    Lack of Transportation (Medical): No    Lack of Transportation (Non-Medical): No  Physical Activity: Not on file  Stress: Not on file  Social Connections: Moderately Isolated (10/09/2024)   Social Connection and Isolation Panel    Frequency of Communication with Friends and Family: More than three times a week    Frequency of Social Gatherings with Friends and Family: More than three times a week    Attends Religious Services: More than 4 times per year    Active Member of [employer] or Organizations: No    Attends Banker Meetings: Never    Marital Status: Widowed  Intimate Partner Violence: Not At Risk (10/09/2024)   Epic    Fear of Current or Ex-Partner: No    Emotionally Abused: No    Physically  Abused: No    Sexually Abused: No  Depression (PHQ2-9): Not on file  Alcohol  Screen: Not on file  Housing: Low Risk (10/09/2024)   Epic    Unable to Pay for Housing in the Last Year: No    Number of Times Moved in the Last Year: 0    Homeless in the Last Year: No  Utilities: Not At Risk (10/09/2024)   Epic    Threatened with loss of utilities: No  Health Literacy: Not on file    PHYSICAL EXAM  There were no vitals filed for this visit.  There is no height or weight on file to calculate BMI.  Virtual visit  DIAGNOSTIC DATA (LABS, IMAGING, TESTING) - I reviewed patient records, labs, notes, testing and imaging myself where available.  Lab Results  Component Value Date   WBC 11.3 (H) 10/10/2024   HGB 10.6 (L) 10/10/2024   HCT 33.2 (L) 10/10/2024   MCV 96.2 10/10/2024   PLT 205 10/10/2024      Component Value Date/Time   NA 139 10/10/2024 0605   K 3.6 10/10/2024 0605   CL  104 10/10/2024 0605   CO2 25 10/10/2024 0605   GLUCOSE 105 (H) 10/10/2024 0605   BUN 14 10/10/2024 0605   CREATININE 0.68 10/10/2024 0605   CALCIUM  8.7 (L) 10/10/2024 0605   PROT 7.2 10/09/2024 0913   ALBUMIN 3.7 10/09/2024 0913   AST 16 10/09/2024 0913   ALT <5 10/09/2024 0913   ALKPHOS 91 10/09/2024 0913   BILITOT 0.4 10/09/2024 0913   GFRNONAA >60 10/10/2024 0605   GFRAA >60 06/07/2020 0332   No results found for: CHOL, HDL, LDLCALC, LDLDIRECT, TRIG, CHOLHDL No results found for: YHAJ8R No results found for: [facility] Lab Results  Component Value Date   TSH 1.749 05/18/2018    Lauraine Born, AGNP-C, DNP 01/09/2025, 10:20 AM [facility] [address], [address] [place], [place] [address] [phone]  "

## 2025-01-10 ENCOUNTER — Ambulatory Visit: Payer: Medicare HMO | Admitting: Neurology

## 2025-01-17 ENCOUNTER — Encounter: Payer: Self-pay | Admitting: Neurology

## 2025-05-02 ENCOUNTER — Ambulatory Visit: Admitting: Neurology
